# Patient Record
Sex: Female | Born: 1950 | Race: Black or African American | Hispanic: No | Smoking: Current every day smoker
Health system: Southern US, Community
[De-identification: ages and names within clinical notes are randomized; demographics above are authoritative.]

## PROBLEM LIST (undated history)

## (undated) DIAGNOSIS — B888 Other specified infestations: Secondary | ICD-10-CM

## (undated) DIAGNOSIS — I10 Essential (primary) hypertension: Secondary | ICD-10-CM

## (undated) DIAGNOSIS — G5600 Carpal tunnel syndrome, unspecified upper limb: Secondary | ICD-10-CM

## (undated) DIAGNOSIS — I739 Peripheral vascular disease, unspecified: Secondary | ICD-10-CM

## (undated) DIAGNOSIS — E785 Hyperlipidemia, unspecified: Secondary | ICD-10-CM

## (undated) DIAGNOSIS — T7840XA Allergy, unspecified, initial encounter: Secondary | ICD-10-CM

## (undated) DIAGNOSIS — K602 Anal fissure, unspecified: Secondary | ICD-10-CM

## (undated) DIAGNOSIS — T8859XA Other complications of anesthesia, initial encounter: Secondary | ICD-10-CM

## (undated) DIAGNOSIS — D649 Anemia, unspecified: Secondary | ICD-10-CM

## (undated) DIAGNOSIS — E118 Type 2 diabetes mellitus with unspecified complications: Secondary | ICD-10-CM

## (undated) DIAGNOSIS — F419 Anxiety disorder, unspecified: Secondary | ICD-10-CM

## (undated) DIAGNOSIS — K279 Peptic ulcer, site unspecified, unspecified as acute or chronic, without hemorrhage or perforation: Secondary | ICD-10-CM

## (undated) DIAGNOSIS — G8929 Other chronic pain: Secondary | ICD-10-CM

## (undated) DIAGNOSIS — H269 Unspecified cataract: Secondary | ICD-10-CM

## (undated) DIAGNOSIS — K5790 Diverticulosis of intestine, part unspecified, without perforation or abscess without bleeding: Secondary | ICD-10-CM

## (undated) DIAGNOSIS — H409 Unspecified glaucoma: Secondary | ICD-10-CM

## (undated) DIAGNOSIS — R51 Headache: Secondary | ICD-10-CM

## (undated) DIAGNOSIS — F32A Depression, unspecified: Secondary | ICD-10-CM

## (undated) DIAGNOSIS — F329 Major depressive disorder, single episode, unspecified: Secondary | ICD-10-CM

## (undated) DIAGNOSIS — T4145XA Adverse effect of unspecified anesthetic, initial encounter: Secondary | ICD-10-CM

## (undated) DIAGNOSIS — E2839 Other primary ovarian failure: Secondary | ICD-10-CM

## (undated) DIAGNOSIS — K219 Gastro-esophageal reflux disease without esophagitis: Secondary | ICD-10-CM

## (undated) DIAGNOSIS — R519 Headache, unspecified: Secondary | ICD-10-CM

## (undated) DIAGNOSIS — G459 Transient cerebral ischemic attack, unspecified: Secondary | ICD-10-CM

## (undated) DIAGNOSIS — H538 Other visual disturbances: Secondary | ICD-10-CM

## (undated) DIAGNOSIS — E039 Hypothyroidism, unspecified: Secondary | ICD-10-CM

## (undated) HISTORY — DX: Other chronic pain: G89.29

## (undated) HISTORY — DX: Hypothyroidism, unspecified: E03.9

## (undated) HISTORY — DX: Headache, unspecified: R51.9

## (undated) HISTORY — PX: REDUCTION MAMMAPLASTY: SUR839

## (undated) HISTORY — PX: BREAST REDUCTION SURGERY: SHX8

## (undated) HISTORY — DX: Unspecified cataract: H26.9

## (undated) HISTORY — DX: Anxiety disorder, unspecified: F41.9

## (undated) HISTORY — DX: Allergy, unspecified, initial encounter: T78.40XA

## (undated) HISTORY — DX: Headache: R51

## (undated) HISTORY — DX: Depression, unspecified: F32.A

## (undated) HISTORY — DX: Anal fissure, unspecified: K60.2

## (undated) HISTORY — DX: Transient cerebral ischemic attack, unspecified: G45.9

## (undated) HISTORY — PX: STOMACH SURGERY: SHX791

## (undated) HISTORY — DX: Major depressive disorder, single episode, unspecified: F32.9

## (undated) HISTORY — DX: Essential (primary) hypertension: I10

## (undated) HISTORY — PX: EYE SURGERY: SHX253

## (undated) HISTORY — DX: Peripheral vascular disease, unspecified: I73.9

## (undated) HISTORY — DX: Unspecified glaucoma: H40.9

## (undated) HISTORY — PX: COLONOSCOPY W/ POLYPECTOMY: SHX1380

## (undated) HISTORY — PX: TOTAL THYROIDECTOMY: SHX2547

## (undated) HISTORY — PX: ANAL FISSURE REPAIR: SHX2312

## (undated) HISTORY — DX: Gastro-esophageal reflux disease without esophagitis: K21.9

## (undated) HISTORY — PX: CATARACT EXTRACTION: SUR2

## (undated) HISTORY — DX: Diverticulosis of intestine, part unspecified, without perforation or abscess without bleeding: K57.90

## (undated) HISTORY — PX: AORTOGRAM: SHX6300

## (undated) HISTORY — DX: Hyperlipidemia, unspecified: E78.5

## (undated) HISTORY — DX: Peptic ulcer, site unspecified, unspecified as acute or chronic, without hemorrhage or perforation: K27.9

---
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
## 2006-04-19 ENCOUNTER — Emergency Department (HOSPITAL_COMMUNITY): Admission: EM | Admit: 2006-04-19 | Discharge: 2006-04-19 | Payer: Self-pay | Admitting: Emergency Medicine

## 2006-04-21 ENCOUNTER — Emergency Department (HOSPITAL_COMMUNITY): Admission: EM | Admit: 2006-04-21 | Discharge: 2006-04-21 | Payer: Self-pay | Admitting: Emergency Medicine

## 2006-09-18 ENCOUNTER — Ambulatory Visit: Payer: Self-pay | Admitting: Internal Medicine

## 2006-09-18 ENCOUNTER — Ambulatory Visit: Payer: Self-pay | Admitting: *Deleted

## 2006-09-18 LAB — CONVERTED CEMR LAB
Albumin: 4.4 g/dL (ref 3.5–5.2)
BUN: 10 mg/dL (ref 6–23)
CO2: 27 meq/L (ref 19–32)
Calcium: 9 mg/dL (ref 8.4–10.5)
Cholesterol: 337 mg/dL — ABNORMAL HIGH (ref 0–200)
Eosinophils Absolute: 0.2 10*3/uL (ref 0.0–0.7)
Free Thyroxine Index: 0.5 — ABNORMAL LOW (ref 1.0–3.9)
Glucose, Bld: 72 mg/dL (ref 70–99)
HDL: 48 mg/dL (ref >39)
MCHC: 31.5 g/dL (ref 30.0–36.0)
MCV: 96 fL (ref 78.0–100.0)
Monocytes Relative: 6 % (ref 3–11)
Neutro Abs: 2.4 10*3/uL (ref 1.7–7.7)
Neutrophils Relative %: 40 % — ABNORMAL LOW (ref 43–77)
Potassium: 4 meq/L (ref 3.5–5.3)
RDW: 14.5 % — ABNORMAL HIGH (ref 11.5–14.0)
Sodium: 139 meq/L (ref 135–145)
T3 Uptake Ratio: 24.1 % (ref 22.5–37.0)
T4, Total: 2.1 ug/dL — ABNORMAL LOW (ref 5.0–12.5)
Total Protein: 9 g/dL — ABNORMAL HIGH (ref 6.0–8.3)
Triglycerides: 327 mg/dL — ABNORMAL HIGH (ref <150)

## 2006-09-24 ENCOUNTER — Ambulatory Visit (HOSPITAL_COMMUNITY): Admission: RE | Admit: 2006-09-24 | Discharge: 2006-09-24 | Payer: Self-pay | Admitting: Internal Medicine

## 2006-12-18 ENCOUNTER — Ambulatory Visit: Payer: Self-pay | Admitting: Internal Medicine

## 2007-01-13 ENCOUNTER — Ambulatory Visit: Payer: Self-pay | Admitting: Internal Medicine

## 2007-02-12 ENCOUNTER — Ambulatory Visit: Payer: Self-pay | Admitting: Internal Medicine

## 2007-02-12 LAB — CONVERTED CEMR LAB
ALT: 8 units/L (ref 0–35)
Albumin: 4.8 g/dL (ref 3.5–5.2)
Alkaline Phosphatase: 78 units/L (ref 39–117)
Basophils Absolute: 0.1 10*3/uL (ref 0.0–0.1)
CO2: 27 meq/L (ref 19–32)
Calcium: 9.8 mg/dL (ref 8.4–10.5)
Creatinine, Ser: 1.5 mg/dL — ABNORMAL HIGH (ref 0.40–1.20)
Eosinophils Absolute: 0.1 10*3/uL (ref 0.0–0.7)
Eosinophils Relative: 2 % (ref 0–5)
HCT: 43.8 % (ref 36.0–46.0)
Hemoglobin: 14.3 g/dL (ref 12.0–15.0)
Lymphocytes Relative: 55 % — ABNORMAL HIGH (ref 12–46)
MCHC: 32.6 g/dL (ref 30.0–36.0)
MCV: 96.1 fL (ref 78.0–100.0)
Monocytes Absolute: 0.4 10*3/uL (ref 0.1–1.0)
Platelets: 169 10*3/uL (ref 150–400)
RDW: 14 % (ref 11.5–15.5)
T3, Total: 101.1 ng/dL (ref 80.0–204.0)
Total Bilirubin: 0.4 mg/dL (ref 0.3–1.2)
Total Protein: 9.5 g/dL — ABNORMAL HIGH (ref 6.0–8.3)

## 2007-03-29 ENCOUNTER — Ambulatory Visit: Payer: Self-pay | Admitting: Internal Medicine

## 2007-03-29 LAB — CONVERTED CEMR LAB
Basophils Absolute: 0 10*3/uL (ref 0.0–0.1)
Basophils Relative: 1 % (ref 0–1)
Cholesterol: 265 mg/dL — ABNORMAL HIGH (ref 0–200)
Eosinophils Absolute: 0.1 10*3/uL (ref 0.0–0.7)
Eosinophils Relative: 1 % (ref 0–5)
HCT: 40.5 % (ref 36.0–46.0)
MCHC: 31.6 g/dL (ref 30.0–36.0)
MCV: 97.6 fL (ref 78.0–100.0)
Platelets: 178 10*3/uL (ref 150–400)
RDW: 14.9 % (ref 11.5–15.5)
T3, Total: 111 ng/dL (ref 80.0–204.0)
T4, Total: 8.1 ug/dL (ref 5.0–12.5)
TSH: 3.67 microintl units/mL (ref 0.350–5.50)
Total CHOL/HDL Ratio: 6.2
VLDL: 72 mg/dL — ABNORMAL HIGH (ref 0–40)

## 2007-04-09 ENCOUNTER — Ambulatory Visit: Payer: Self-pay | Admitting: Internal Medicine

## 2007-06-25 ENCOUNTER — Encounter: Payer: Self-pay | Admitting: Family Medicine

## 2007-06-25 ENCOUNTER — Ambulatory Visit: Payer: Self-pay | Admitting: Internal Medicine

## 2007-06-25 LAB — CONVERTED CEMR LAB: T4, Total: 9.9 ug/dL (ref 5.0–12.5)

## 2007-08-05 ENCOUNTER — Ambulatory Visit: Payer: Self-pay | Admitting: Internal Medicine

## 2007-10-03 ENCOUNTER — Emergency Department (HOSPITAL_COMMUNITY): Admission: EM | Admit: 2007-10-03 | Discharge: 2007-10-03 | Payer: Self-pay | Admitting: Emergency Medicine

## 2008-04-17 ENCOUNTER — Emergency Department (HOSPITAL_COMMUNITY): Admission: EM | Admit: 2008-04-17 | Discharge: 2008-04-17 | Payer: Self-pay | Admitting: Emergency Medicine

## 2008-04-20 ENCOUNTER — Ambulatory Visit: Payer: Self-pay | Admitting: Internal Medicine

## 2008-04-20 ENCOUNTER — Inpatient Hospital Stay (HOSPITAL_COMMUNITY): Admission: EM | Admit: 2008-04-20 | Discharge: 2008-04-22 | Payer: Self-pay | Admitting: Emergency Medicine

## 2008-04-21 ENCOUNTER — Encounter (INDEPENDENT_AMBULATORY_CARE_PROVIDER_SITE_OTHER): Payer: Self-pay | Admitting: Cardiology

## 2008-07-12 ENCOUNTER — Emergency Department (HOSPITAL_COMMUNITY): Admission: EM | Admit: 2008-07-12 | Discharge: 2008-07-12 | Payer: Self-pay | Admitting: Family Medicine

## 2008-09-29 ENCOUNTER — Emergency Department (HOSPITAL_COMMUNITY): Admission: EM | Admit: 2008-09-29 | Discharge: 2008-09-29 | Payer: Self-pay | Admitting: Family Medicine

## 2008-11-30 ENCOUNTER — Emergency Department (HOSPITAL_COMMUNITY): Admission: EM | Admit: 2008-11-30 | Discharge: 2008-11-30 | Payer: Self-pay | Admitting: Emergency Medicine

## 2008-12-01 ENCOUNTER — Ambulatory Visit: Payer: Self-pay | Admitting: Internal Medicine

## 2009-01-10 ENCOUNTER — Ambulatory Visit: Payer: Self-pay | Admitting: Internal Medicine

## 2009-01-10 LAB — CONVERTED CEMR LAB
ALT: 9 units/L (ref 0–35)
AST: 13 units/L (ref 0–37)
Basophils Absolute: 0 10*3/uL (ref 0.0–0.1)
CO2: 24 meq/L (ref 19–32)
Calcium: 9.3 mg/dL (ref 8.4–10.5)
Chloride: 103 meq/L (ref 96–112)
Cholesterol: 365 mg/dL — ABNORMAL HIGH (ref 0–200)
Eosinophils Absolute: 0.2 10*3/uL (ref 0.0–0.7)
Eosinophils Relative: 3 % (ref 0–5)
HCT: 43.3 % (ref 36.0–46.0)
Hemoglobin: 14.1 g/dL (ref 12.0–15.0)
Lymphocytes Relative: 50 % — ABNORMAL HIGH (ref 12–46)
MCHC: 32.6 g/dL (ref 30.0–36.0)
MCV: 99.5 fL (ref 78.0–100.0)
Monocytes Absolute: 0.5 10*3/uL (ref 0.1–1.0)
Platelets: 171 10*3/uL (ref 150–400)
RDW: 14.2 % (ref 11.5–15.5)
Sodium: 140 meq/L (ref 135–145)
TSH: 5.69 microintl units/mL — ABNORMAL HIGH (ref 0.350–4.500)
Total Protein: 7.7 g/dL (ref 6.0–8.3)

## 2009-01-30 ENCOUNTER — Ambulatory Visit: Payer: Self-pay | Admitting: Internal Medicine

## 2009-02-02 ENCOUNTER — Ambulatory Visit (HOSPITAL_COMMUNITY): Admission: RE | Admit: 2009-02-02 | Discharge: 2009-02-02 | Payer: Self-pay | Admitting: Internal Medicine

## 2009-02-05 ENCOUNTER — Ambulatory Visit: Payer: Self-pay | Admitting: Internal Medicine

## 2009-02-07 ENCOUNTER — Ambulatory Visit: Payer: Self-pay | Admitting: Internal Medicine

## 2009-02-15 ENCOUNTER — Encounter (INDEPENDENT_AMBULATORY_CARE_PROVIDER_SITE_OTHER): Payer: Self-pay | Admitting: *Deleted

## 2009-02-26 ENCOUNTER — Ambulatory Visit: Payer: Self-pay | Admitting: Internal Medicine

## 2009-02-26 LAB — CONVERTED CEMR LAB
BUN: 17 mg/dL (ref 6–23)
Calcium: 9.6 mg/dL (ref 8.4–10.5)
Glucose, Bld: 86 mg/dL (ref 70–99)
Sodium: 140 meq/L (ref 135–145)
Total CK: 106 units/L (ref 7–177)

## 2009-03-19 ENCOUNTER — Ambulatory Visit: Payer: Self-pay | Admitting: Internal Medicine

## 2009-03-19 LAB — CONVERTED CEMR LAB: TSH: 0.347 microintl units/mL — ABNORMAL LOW (ref 0.350–4.500)

## 2009-03-20 ENCOUNTER — Ambulatory Visit: Payer: Self-pay | Admitting: Internal Medicine

## 2009-03-21 ENCOUNTER — Encounter (INDEPENDENT_AMBULATORY_CARE_PROVIDER_SITE_OTHER): Payer: Self-pay | Admitting: Internal Medicine

## 2009-03-21 LAB — CONVERTED CEMR LAB: Helicobacter Pylori Antibody-IgG: <0.4

## 2009-04-04 ENCOUNTER — Ambulatory Visit: Payer: Self-pay | Admitting: Internal Medicine

## 2009-04-20 ENCOUNTER — Encounter (HOSPITAL_COMMUNITY): Admission: RE | Admit: 2009-04-20 | Discharge: 2009-06-14 | Payer: Self-pay | Admitting: General Surgery

## 2009-05-07 ENCOUNTER — Ambulatory Visit: Payer: Self-pay | Admitting: Internal Medicine

## 2009-05-07 LAB — CONVERTED CEMR LAB
Alkaline Phosphatase: 96 units/L (ref 39–117)
BUN: 17 mg/dL (ref 6–23)
Basophils Absolute: 0 10*3/uL (ref 0.0–0.1)
Basophils Relative: 1 % (ref 0–1)
CO2: 25 meq/L (ref 19–32)
Creatinine, Ser: 1.2 mg/dL (ref 0.40–1.20)
Free T4: 1.63 ng/dL (ref 0.80–1.80)
Glucose, Bld: 82 mg/dL (ref 70–99)
Lymphocytes Relative: 40 % (ref 12–46)
Neutro Abs: 3.2 10*3/uL (ref 1.7–7.7)
Neutrophils Relative %: 50 % (ref 43–77)
Platelets: 165 10*3/uL (ref 150–400)
RDW: 14.2 % (ref 11.5–15.5)
TSH: 0.059 microintl units/mL — ABNORMAL LOW (ref 0.350–4.500)
Total Bilirubin: 0.4 mg/dL (ref 0.3–1.2)
Total Protein: 8.3 g/dL (ref 6.0–8.3)
WBC: 6.4 10*3/uL (ref 4.0–10.5)

## 2009-08-02 ENCOUNTER — Encounter (INDEPENDENT_AMBULATORY_CARE_PROVIDER_SITE_OTHER): Payer: Self-pay | Admitting: General Surgery

## 2009-08-02 ENCOUNTER — Ambulatory Visit (HOSPITAL_COMMUNITY): Admission: RE | Admit: 2009-08-02 | Discharge: 2009-08-05 | Payer: Self-pay | Admitting: General Surgery

## 2009-08-10 ENCOUNTER — Ambulatory Visit: Payer: Self-pay | Admitting: Internal Medicine

## 2009-08-29 ENCOUNTER — Ambulatory Visit: Payer: Self-pay | Admitting: Internal Medicine

## 2009-09-04 ENCOUNTER — Ambulatory Visit: Payer: Self-pay | Admitting: Internal Medicine

## 2009-11-16 ENCOUNTER — Encounter (INDEPENDENT_AMBULATORY_CARE_PROVIDER_SITE_OTHER): Payer: Self-pay | Admitting: Internal Medicine

## 2009-11-16 LAB — CONVERTED CEMR LAB
AST: 15 units/L (ref 0–37)
Albumin: 5 g/dL (ref 3.5–5.2)
Alkaline Phosphatase: 118 units/L — ABNORMAL HIGH (ref 39–117)
BUN: 10 mg/dL (ref 6–23)
Calcium: 10.2 mg/dL (ref 8.4–10.5)
Chloride: 103 meq/L (ref 96–112)
Potassium: 3.9 meq/L (ref 3.5–5.3)
Sodium: 141 meq/L (ref 135–145)
Total Protein: 8.1 g/dL (ref 6.0–8.3)

## 2010-01-13 HISTORY — PX: OTHER SURGICAL HISTORY: SHX169

## 2010-02-12 NOTE — Letter (Signed)
Summary: Generic Letter  [facility]  [address]   [place], [place] [address]   Phone: [phone]  Fax: [phone]    02/15/2009  LYDIA TOREN [address] [place], [place]  [address]  Dear Ms. Daponte,   We have been unable to reach you by phone. Dr. Reche Dixon wants you to continue your blood pressure medication. He also wants you to have some lab work. I have scheduled a lab visit for 02/26/09 at 10:10. If that appointment is not convenient please contact our office to reschedule.        Sincerely,   Gaylyn Cheers RN

## 2010-03-30 LAB — SURGICAL PCR SCREEN: Staphylococcus aureus: NEGATIVE

## 2010-03-30 LAB — BASIC METABOLIC PANEL
BUN: 9 mg/dL (ref 6–23)
Chloride: 99 mEq/L (ref 96–112)
Creatinine, Ser: 0.94 mg/dL (ref 0.4–1.2)
GFR calc Af Amer: >60 mL/min (ref >60)
Glucose, Bld: 116 mg/dL — ABNORMAL HIGH (ref 70–99)
Potassium: 4.1 mEq/L (ref 3.5–5.1)

## 2010-03-30 LAB — DIFFERENTIAL
Monocytes Absolute: 0.5 10*3/uL (ref 0.1–1.0)
Monocytes Relative: 8 % (ref 3–12)
Neutro Abs: 1.9 10*3/uL (ref 1.7–7.7)
Neutrophils Relative %: 32 % — ABNORMAL LOW (ref 43–77)

## 2010-03-30 LAB — COMPREHENSIVE METABOLIC PANEL
Albumin: 3.7 g/dL (ref 3.5–5.2)
BUN: 10 mg/dL (ref 6–23)
Creatinine, Ser: 1.15 mg/dL (ref 0.4–1.2)
GFR calc Af Amer: 58 mL/min — ABNORMAL LOW (ref >60)
Glucose, Bld: 105 mg/dL — ABNORMAL HIGH (ref 70–99)
Total Bilirubin: 0.4 mg/dL (ref 0.3–1.2)
Total Protein: 7.7 g/dL (ref 6.0–8.3)

## 2010-03-30 LAB — CBC
HCT: 37.5 % (ref 36.0–46.0)
HCT: 38.8 % (ref 36.0–46.0)
Hemoglobin: 12.9 g/dL (ref 12.0–15.0)
MCH: 31.6 pg (ref 26.0–34.0)
MCH: 32.2 pg (ref 26.0–34.0)
MCHC: 33.6 g/dL (ref 30.0–36.0)
MCHC: 34 g/dL (ref 30.0–36.0)
MCHC: 34.3 g/dL (ref 30.0–36.0)
MCV: 93.7 fL (ref 78.0–100.0)
Platelets: 157 10*3/uL (ref 150–400)
RDW: 13.7 % (ref 11.5–15.5)
RDW: 13.9 % (ref 11.5–15.5)
RDW: 13.9 % (ref 11.5–15.5)
WBC: 5.9 10*3/uL (ref 4.0–10.5)

## 2010-03-30 LAB — CALCIUM: Calcium: 8.7 mg/dL (ref 8.4–10.5)

## 2010-03-30 LAB — EXPECTORATED SPUTUM ASSESSMENT W GRAM STAIN, RFLX TO RESP C

## 2010-04-17 LAB — POCT I-STAT, CHEM 8
BUN: 14 mg/dL (ref 6–23)
Chloride: 105 mEq/L (ref 96–112)
Creatinine, Ser: 1.3 mg/dL — ABNORMAL HIGH (ref 0.4–1.2)
Hemoglobin: 15 g/dL (ref 12.0–15.0)
Potassium: 4.1 mEq/L (ref 3.5–5.1)
Sodium: 140 mEq/L (ref 135–145)

## 2010-04-24 LAB — LIPID PANEL
Cholesterol: 250 mg/dL — ABNORMAL HIGH (ref 0–200)
HDL: 20 mg/dL — ABNORMAL LOW (ref >39)
HDL: 25 mg/dL — ABNORMAL LOW (ref >39)
LDL Cholesterol: 165 mg/dL — ABNORMAL HIGH (ref 0–99)
LDL Cholesterol: 173 mg/dL — ABNORMAL HIGH (ref 0–99)
Total CHOL/HDL Ratio: 10 RATIO
Total CHOL/HDL Ratio: 11.8 RATIO
Triglycerides: 238 mg/dL — ABNORMAL HIGH (ref <150)
Triglycerides: 261 mg/dL — ABNORMAL HIGH (ref <150)
Triglycerides: 359 mg/dL — ABNORMAL HIGH (ref <150)
VLDL: 48 mg/dL — ABNORMAL HIGH (ref 0–40)
VLDL: 52 mg/dL — ABNORMAL HIGH (ref 0–40)
VLDL: 72 mg/dL — ABNORMAL HIGH (ref 0–40)

## 2010-04-24 LAB — APTT: aPTT: 33 seconds (ref 24–37)

## 2010-04-24 LAB — CBC
HCT: 39.6 % (ref 36.0–46.0)
Hemoglobin: 12.5 g/dL (ref 12.0–15.0)
Hemoglobin: 13.3 g/dL (ref 12.0–15.0)
MCHC: 34.5 g/dL (ref 30.0–36.0)
RBC: 3.87 MIL/uL (ref 3.87–5.11)
RDW: 14.9 % (ref 11.5–15.5)
WBC: 4.8 10*3/uL (ref 4.0–10.5)

## 2010-04-24 LAB — COMPREHENSIVE METABOLIC PANEL
ALT: 9 U/L (ref 0–35)
AST: 15 U/L (ref 0–37)
Albumin: 3.1 g/dL — ABNORMAL LOW (ref 3.5–5.2)
Alkaline Phosphatase: 65 U/L (ref 39–117)
Alkaline Phosphatase: 79 U/L (ref 39–117)
BUN: 8 mg/dL (ref 6–23)
Calcium: 8.7 mg/dL (ref 8.4–10.5)
GFR calc Af Amer: 57 mL/min — ABNORMAL LOW (ref >60)
GFR calc non Af Amer: 49 mL/min — ABNORMAL LOW (ref >60)
Glucose, Bld: 85 mg/dL (ref 70–99)
Glucose, Bld: 92 mg/dL (ref 70–99)
Potassium: 3.9 mEq/L (ref 3.5–5.1)
Potassium: 4.2 mEq/L (ref 3.5–5.1)
Sodium: 137 mEq/L (ref 135–145)
Total Bilirubin: 0.4 mg/dL (ref 0.3–1.2)
Total Protein: 6.4 g/dL (ref 6.0–8.3)
Total Protein: 7.4 g/dL (ref 6.0–8.3)

## 2010-04-24 LAB — DIFFERENTIAL
Basophils Absolute: 0 10*3/uL (ref 0.0–0.1)
Basophils Relative: 1 % (ref 0–1)
Monocytes Relative: 7 % (ref 3–12)
Neutro Abs: 2 10*3/uL (ref 1.7–7.7)
Neutrophils Relative %: 40 % — ABNORMAL LOW (ref 43–77)

## 2010-04-24 LAB — URINALYSIS, ROUTINE W REFLEX MICROSCOPIC
Bilirubin Urine: NEGATIVE
Hgb urine dipstick: NEGATIVE
Nitrite: NEGATIVE
Specific Gravity, Urine: 1.018 (ref 1.005–1.030)
pH: 6 (ref 5.0–8.0)

## 2010-04-24 LAB — POCT I-STAT, CHEM 8
Calcium, Ion: 1.15 mmol/L (ref 1.12–1.32)
Chloride: 110 mEq/L (ref 96–112)
Creatinine, Ser: 0.9 mg/dL (ref 0.4–1.2)
Glucose, Bld: 96 mg/dL (ref 70–99)
HCT: 44 % (ref 36.0–46.0)
HCT: 44 % (ref 36.0–46.0)
Hemoglobin: 15 g/dL (ref 12.0–15.0)
Potassium: 4 mEq/L (ref 3.5–5.1)
TCO2: 31 mmol/L (ref 0–100)

## 2010-04-24 LAB — TROPONIN I
Troponin I: 0.01 ng/mL (ref 0.00–0.06)
Troponin I: <0.01 ng/mL (ref 0.00–0.06)

## 2010-04-24 LAB — PROTIME-INR
INR: 1 (ref 0.00–1.49)
Prothrombin Time: 13.6 seconds (ref 11.6–15.2)

## 2010-04-24 LAB — T4, FREE: Free T4: 1.02 ng/dL (ref 0.80–1.80)

## 2010-04-24 LAB — CK TOTAL AND CKMB (NOT AT ARMC)
Relative Index: INVALID (ref 0.0–2.5)
Total CK: 74 U/L (ref 7–177)
Total CK: 78 U/L (ref 7–177)

## 2010-04-24 LAB — POCT CARDIAC MARKERS

## 2010-05-28 NOTE — Discharge Summary (Signed)
NAMEELIANIS, FISCHBACH               ACCOUNT NO.:  [id number]   MEDICAL RECORD NO.:  [id number]           PATIENT TYPE:   LOCATION:                                 FACILITY:   PHYSICIAN:  Herbie Saxon, MDDATE OF BIRTH:  06-19-1950   DATE OF ADMISSION:  DATE OF DISCHARGE:                               DISCHARGE SUMMARY   ADDENDUM:   MEDICATIONS:  Nicotine patch 10 mg per day and Toprol-XL 25 mg daily.      Herbie Saxon, MD  Electronically Signed     MIO/MEDQ  D:  04/22/2008  T:  04/23/2008  Job:  [id number]

## 2010-05-28 NOTE — Discharge Summary (Signed)
[name], [name]               ACCOUNT NO.:  [id number]   MEDICAL RECORD NO.:  [id number]          PATIENT TYPE:  INP   LOCATION:  3735                         FACILITY:  [facility]   PHYSICIAN:  Herbie Saxon, MDDATE OF BIRTH:  Jul 08, 1950   DATE OF ADMISSION:  04/20/2008  DATE OF DISCHARGE:  04/22/2008                               DISCHARGE SUMMARY   DISCHARGE DIAGNOSES:  1. Hypertension emergency, resolved.  2. Chest pain, atypical.  3. Headache, improved.  4. Ongoing tobacco abuse.  5. Hyperlipidemia.  6. Poor medication compliance due to lack of medical insurance.  7. Hypothyroidism.  8. History of multinodular goiter.  9. History of peptic ulcer disease.  10.Mild thrombocytopenia.   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  No acute changes.  CT of abdomen and pelvis on  April 21, 2008, showed descending colon diverticulosis without acute  inflammatory changes, left renal lower pole cyst.  No acute findings in  the pelvis.   HOSPITAL COURSE:  This 60 year old African American lady, who presented  to the emergency room complaining of severe frontal headache and  substernal chest pain, which was dull like, radiating to the left arm.  On presentation, she had malignant hypertension with systolic blood  pressure greater than 200 and diastolic greater than 110.  She was  admitted, started on nitroglycerin.  She had been poorly compliant with  her previous home medications, hence the patient needs recertification  through followup with [facility].  Her serial cardiac enzymes were  negative.  CRP and EKG were also negative.  Lipid panel showed  hypercholesterolemia with total cholesterol 230, LDL approximately 144,  HDL approximately of 20, and triglycerides of 359.  A 2-D echocardiogram  was performed on April 21, 2008, which showed ejection fraction of 60-65%  with moderate mitral annular calcification.  No regional  wall motion  abnormalities noted.  The patient is being counseled on tobacco  cessation.  She had a nicotine patch on this admission.  The patient has  been counseled on the need to comply with blood pressure medications to  avoid catastrophe of uncontrolled hypertension.  She was verbalized good  understanding.   DISCHARGE CONDITION:  Stable.   DIET:  Heart healthy, 2 g sodium, low cholesterol.   ACTIVITY:  Increase slowly as tolerated.   PCP to  arrange outpatient stress echocardiogram as needed.   DISCHARGE MEDICATIONS:  1. Lisinopril 40 mg daily.  2. Synthroid 88 mcg daily.  3. Norvasc 5 mg daily.  4. Protonix 40 mg daily.  5. Vitamin supplements daily.  6. Crestor 40 mg daily.  7. Plavix 75 mg daily.   PHYSICAL EXAMINATION:  GENERAL:  She is an elderly lady not in acute  distress.  VITAL SIGNS:  Temperature 98, pulse 54, respirations 18, and blood  pressure 175/97.  Please note the medications the patient to hold, blood  pressure medications if blood pressure less than 110/60 or heart rate  less than 50.  HEENT:  Head is atraumatic and normocephalic.  Mucous membranes are  moist.  Oropharynx moist and clear.  NECK:  Supple.  No lymphadenopathy.  No thyromegaly.  No JVD.  CHEST:  Clinically clear.  CVS:  Regular rate and rhythm.  No murmur, rub, or gallop.  ABDOMEN:  Benign.  CNS:  No gross deficits.  EXTREMITIES:  There is no clubbing, cyanosis, or edema.  Normal range of  motion.  SKIN:  No rashes.  PSYCH:  Mood is stable.   Thyroid function tests normal.  Chemistries; sodium 137, potassium 3.9,  chloride 106, bicarbonate 25, glucose 92, BUN 11, and creatinine 1.1.  Complete blood count, WBC 12.8, hematocrit 36, platelet count is 134.      Herbie Saxon, MD  Electronically Signed     MIO/MEDQ  D:  04/22/2008  T:  04/23/2008  Job:  [id number]

## 2010-05-28 NOTE — H&P (Signed)
[name], [name]               ACCOUNT NO.:  [id number]   MEDICAL RECORD NO.:  [id number]          PATIENT TYPE:  EMS   LOCATION:  MAJO                         FACILITY:  [facility]   PHYSICIAN:  Eduard Clos, MDDATE OF BIRTH:  1950-02-29   DATE OF ADMISSION:  04/20/2008  DATE OF DISCHARGE:                              HISTORY & PHYSICAL   PRIMARY CARE PHYSICIAN:  Dr. Reche Dixon at [facility].   CHIEF COMPLAINT:  Headache.   HISTORY OF PRESENTING ILLNESS:  A 60 year old female with known history  of hypertension, hyperlipidemia presented with complaints of headache  over the last 1 week.  She has been experiencing headache.  Headache is  frontal and occipital, constant, dull aching, sometimes excruciating.  Denies any associated dizziness or loss of consciousness or visual  symptoms.  In addition, patient started experiencing chest pain from  last night.  The chest pain is dull aching, pressure like, radiating to  the left arm.  Denies any associated shortness of breath or palpitation.  Denies any fever, chills, cough, or any nausea, vomiting, diarrhea, or  dysuria, or abdominal pain.  In the ER, patient was found to have very  high blood pressure, systolic more than 200s with a diastolic around  161W.  Patient has been admitted for further management and observation  for her chest pain and blood pressure.  Headache has largely resolved at  this time.  CT of the head done in the ER is negative for any acute  findings.  Patient stated that she has been taking her medications  regularly except for Norvasc which she was not able to get filled.   PAST MEDICAL HISTORY:  1. Hypertension.  2. Hyperlipidemia.  3. Ongoing tobacco abuse.   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   ALLERGIES:  1. ASPIRIN.  2. PENICILLIN.   MEDICATION PRIOR TO ADMISSION:  1. Lisinopril 40 mg p.o. daily.  2. Synthroid 88 mcg p.o. daily.  3. Norvasc 5 mg p.o.  daily.  4. Crestor 20 mg p.o. daily.  5. Protonix 40 mg p.o. daily.  6. Allegra 180 mg p.o. daily.  7. Catapres 0.2 mg p.o. b.i.d.   SOCIAL HISTORY:  Patient smokes cigarettes and has been advised to quit  smoking.  Denies any alcohol or drug abuse.   REVIEW OF SYSTEMS:  As per history of present illness, nothing else  significant.   PHYSICAL EXAMINATION:  Patient examined at bedside, not in acute  distress.  VITAL SIGNS:  Blood pressure is 140/60.  Pulse 70 per minute.  Temperature 97.1.  Respiratory rate 18 per minute.  O2 saturation 95%.  HEENT: Anicteric.  No pallor.  No facial asymmetry.  PERRLA positive.  Tongue is midline.  CHEST:  Bilateral air entry present.  No rhonchi.  No crepitation.  S1  and S2 heard.  ABDOMEN:  Soft, mild tenderness in the left upper and lower quadrant.  Patient said it has been there for a few days but has no diarrhea,  nausea, or vomiting.  There is no guarding or rigidity.  CNS:  Alert,  awake, oriented to time, place, and person.  Moves upper  and lower extremities.  EXTREMITIES:  Peripheral pulses felt.  No edema.   LABS:  CT of the head without contrast shows no acute findings, old  small left caudate head infarct, mild small vessel disease-type changes.  No CT evidence of any large acute infarct.  CBC, WBC is 4.9, hemoglobin  15, hematocrit 44, platelets 151, PT/INR 13.6 and 1.  Complete metabolic  panel, sodium 142, potassium 4.2, chloride 103, carbon dioxide 25,  glucose 82, BUN 10, creatinine 1.2, total bilirubin 0.4, alkaline  phosphatase 79, AST 16, ALT 13, total protein 7.4, albumin 3.6, calcium  9.1, troponin-I less than 0.05.   ASSESSMENT:  1. Chest pain, rule out acute coronary syndrome.  2. Accelerated hypertension.  3. Headache secondary to above.  4. Ongoing tobacco abuse.  5. Hyperlipidemia.  6. Nonspecific abdominal pain.   PLAN:  We will admit patient to telemetry.  Cycle cardiac markers.  At  this time, I do not find  an EKG done.  We will do an EKG stat and we  will evaluate that.  We will do a chest x-ray, 2D echo, cycle cardiac  markers.  Resume her home medications.  We will add Plavix as PATIENT IS  ALLERGIC TO ASPIRIN and further recommendation as condition evolves.      Eduard Clos, MD  Electronically Signed     ANK/MEDQ  D:  04/20/2008  T:  04/20/2008  Job:  [id number]

## 2010-10-14 LAB — POCT I-STAT, CHEM 8
BUN: 15
Creatinine, Ser: 1.3 — ABNORMAL HIGH
Glucose, Bld: 117 — ABNORMAL HIGH
Potassium: 4.1
Sodium: 136

## 2010-10-14 LAB — URINALYSIS, ROUTINE W REFLEX MICROSCOPIC
Glucose, UA: NEGATIVE
Hgb urine dipstick: NEGATIVE
Ketones, ur: NEGATIVE
Nitrite: NEGATIVE
Protein, ur: NEGATIVE
Specific Gravity, Urine: 1.034 — ABNORMAL HIGH
Urobilinogen, UA: 1
pH: 5.5

## 2012-03-29 ENCOUNTER — Ambulatory Visit: Payer: Self-pay | Admitting: Internal Medicine

## 2012-04-15 ENCOUNTER — Ambulatory Visit (INDEPENDENT_AMBULATORY_CARE_PROVIDER_SITE_OTHER): Payer: Self-pay | Admitting: Radiation Oncology

## 2012-04-15 ENCOUNTER — Encounter: Payer: Self-pay | Admitting: Radiation Oncology

## 2012-04-15 VITALS — BP 102/71 | HR 90 | Temp 98.5°F | Ht 61.4 in | Wt 214.4 lb

## 2012-04-15 DIAGNOSIS — E785 Hyperlipidemia, unspecified: Secondary | ICD-10-CM | POA: Insufficient documentation

## 2012-04-15 DIAGNOSIS — Z0000 Encounter for general adult medical examination without abnormal findings: Secondary | ICD-10-CM | POA: Insufficient documentation

## 2012-04-15 DIAGNOSIS — Z79899 Other long term (current) drug therapy: Secondary | ICD-10-CM

## 2012-04-15 DIAGNOSIS — K219 Gastro-esophageal reflux disease without esophagitis: Secondary | ICD-10-CM | POA: Insufficient documentation

## 2012-04-15 DIAGNOSIS — I739 Peripheral vascular disease, unspecified: Secondary | ICD-10-CM | POA: Insufficient documentation

## 2012-04-15 DIAGNOSIS — E039 Hypothyroidism, unspecified: Secondary | ICD-10-CM | POA: Insufficient documentation

## 2012-04-15 DIAGNOSIS — I10 Essential (primary) hypertension: Secondary | ICD-10-CM | POA: Insufficient documentation

## 2012-04-15 MED ORDER — DOXEPIN HCL 50 MG PO CAPS: 50.0000 mg | ORAL_CAPSULE | Freq: Every day | ORAL | Status: DC

## 2012-04-15 MED ORDER — CLOPIDOGREL BISULFATE 75 MG PO TABS: 75.0000 mg | ORAL_TABLET | Freq: Every day | ORAL | Status: DC

## 2012-04-15 MED ORDER — TRIAMTERENE-HCTZ 37.5-25 MG PO TABS: 1.0000 | ORAL_TABLET | Freq: Every day | ORAL | Status: DC

## 2012-04-15 MED ORDER — AMLODIPINE BESYLATE 10 MG PO TABS: 10.0000 mg | ORAL_TABLET | Freq: Every day | ORAL | Status: DC

## 2012-04-15 MED ORDER — PANTOPRAZOLE SODIUM 40 MG PO TBEC: 40.0000 mg | DELAYED_RELEASE_TABLET | Freq: Every day | ORAL | Status: DC

## 2012-04-15 MED ORDER — LISINOPRIL 20 MG PO TABS: 20.0000 mg | ORAL_TABLET | Freq: Every day | ORAL | Status: DC

## 2012-04-15 MED ORDER — LEVOTHYROXINE SODIUM 88 MCG PO TABS: 88.0000 ug | ORAL_TABLET | Freq: Every day | ORAL | Status: DC

## 2012-04-15 MED ORDER — ROSUVASTATIN CALCIUM 20 MG PO TABS: 20.0000 mg | ORAL_TABLET | Freq: Every day | ORAL | Status: DC

## 2012-04-15 NOTE — Assessment & Plan Note (Addendum)
At baseline, per patient. Pt states this issue has been present for many years since she had an operation for PUD (pt unclear as to exact procedure).  - continue protonix 40mg  daily

## 2012-04-15 NOTE — Assessment & Plan Note (Signed)
Patient had a lower extremity arteriogram in 10/2010 which revealed bilateral superficial femoral artery stenosis, L>R. Per her report, she subsequently underwent left SFA stenting which improved her claudication symptoms significantly, as her symptoms have been worse in that leg. As the patient believes her claudication is slightly worsened recently, her symptoms will need to be followed closely going forward, however as she has been noncompliant with lipid lowering therapy as well as with antiplatelet therapy, will resume these medications today prior to starting additional meds. - plavix 75mg  QD - crestor 20mg  QD

## 2012-04-15 NOTE — Assessment & Plan Note (Signed)
Last LDL = 86 in 01/2011 per the records available from the patient's previous PCP in [place]. It is unclear why the patient is not currently prescribed a statin as was previously prescribed for this issue. Given that her Crestor seemed to be discontinued at some point in time after her last LDL was drawn, will need to repeat lipid profile to assess the appropriate intensity of lipid-lowering therapy going forward. As she has active worsened claudication, however, will resume her previous crestor 20mg  daily until results are available.  - crestor 20mg  QD - check lipid profile

## 2012-04-15 NOTE — Progress Notes (Signed)
Subjective:    Patient ID: [name], female    DOB: [birth year], 62 y.o.   MRN: [id number]  HPI Patient is a 62 year old woman with PMH significant for peripheral artery disease, hypertension, hyperlipidemia, peptic ulcer disease, TIA, hypothyroidism with prior thyroidectomy (Hashimoto's thyroiditis) who presents to clinic today to establish care with a PCP after recently moving from [place].   The patient complains of claudication-like symptoms on walking beyond a certain distance (several blocks), which she states is chronic but perhaps slightly worsened recently. She states she has not taken some of her medications recently due to running out of her prescriptions, including her prescription for Plavix.   The patient's only other complaint today is of pain in the left posterior neck, which he states is improved with Flexeril which she has been previously prescribed.  The patient states she's otherwise feeling well and has no other complaints today. She requests that her medications be refilled today, but states that she would not like to have any laboratory work or other routine health maintenance items performed on today's visit as she is "not ready for that today".   Review of Systems  All other systems reviewed and are negative.   Current Outpatient Medications: Current Outpatient Prescriptions  Medication Sig Dispense Refill  . amLODipine (NORVASC) 10 MG tablet Take 1 tablet (10 mg total) by mouth daily.  30 tablet  11  . clopidogrel (PLAVIX) 75 MG tablet Take 1 tablet (75 mg total) by mouth daily.  30 tablet  11  . doxepin (SINEQUAN) 50 MG capsule Take 1 capsule (50 mg total) by mouth at bedtime.  30 capsule  5  . levothyroxine (SYNTHROID) 88 MCG tablet Take 1 tablet (88 mcg total) by mouth daily.  30 tablet  11  . lisinopril (PRINIVIL,ZESTRIL) 20 MG tablet Take 1 tablet (20 mg total) by mouth daily.  30 tablet  11  . rosuvastatin (CRESTOR) 20 MG tablet Take 1 tablet (20 mg  total) by mouth at bedtime.  30 tablet  11  . triamterene-hydrochlorothiazide (MAXZIDE-25) 37.5-25 MG per tablet Take 1 each (1 tablet total) by mouth daily.  30 tablet  11   No current facility-administered medications for this visit.    Allergies: Allergies not on file   Past Medical History: Past Medical History  Diagnosis Date  . Hypothyroidism   . Hypertension   . TIA (transient ischemic attack)   . PUD (peptic ulcer disease)   . HLD (hyperlipidemia)   . Depression   . GERD (gastroesophageal reflux disease)     Past Surgical History: Past Surgical History  Procedure Laterality Date  . Total thyroidectomy      Family History: No family history on file.  Social History: History   Social History  . Marital Status: Married    Spouse Name: N/A    Number of Children: N/A  . Years of Education: N/A   Occupational History  . Not on file.   Social History Main Topics  . Smoking status: Current Every Day Smoker -- 0.75 packs/day  . Smokeless tobacco: Not on file     Comment: try to quit  . Alcohol Use: Not on file  . Drug Use: Not on file  . Sexually Active: Not on file   Other Topics Concern  . Not on file   Social History Narrative  . No narrative on file     Vital Signs: Blood pressure 102/71, pulse 90, temperature 98.5 F (36.9 C), temperature source Oral, height 5'  1.4" (1.56 m), weight 214 lb 6.4 oz (97.251 kg), SpO2 98.00%.       Objective:   Physical Exam  Constitutional: She is oriented to person, place, and time. She appears well-developed and well-nourished. No distress.  HENT:  Head: Normocephalic and atraumatic.  Eyes: Conjunctivae are normal. Pupils are equal, round, and reactive to light. No scleral icterus.  Neck: Normal range of motion. Neck supple. No tracheal deviation present.  Cardiovascular: Normal rate and regular rhythm.   No murmur heard. Pulmonary/Chest: Effort normal. She has no wheezes. She has no rales.  Abdominal:  Soft. Bowel sounds are normal. She exhibits no distension. There is no tenderness.  Musculoskeletal: Normal range of motion. She exhibits no edema.  Neurological: She is alert and oriented to person, place, and time. No cranial nerve deficit.          Assessment & Plan:

## 2012-04-15 NOTE — Assessment & Plan Note (Signed)
TSH and free T4 within normal limits as of 01/2011 per records from [facility] PCP. No signs/symptoms of hyperthyroidism or hypothyroidism.  - continue Synthroid 88 mcg daily - recheck TSH

## 2012-04-15 NOTE — Addendum Note (Signed)
Addended by: Elenor Legato R on: 04/15/2012 01:34 PM   Modules accepted: Orders

## 2012-04-15 NOTE — Assessment & Plan Note (Addendum)
BP Readings from Last 3 Encounters:  04/15/12 102/71    Lab Results  Component Value Date   NA 141 11/16/2009   K 3.9 11/16/2009   CREATININE 1.15 11/16/2009    Assessment:  Blood pressure control: controlled  Progress toward BP goal:  at goal  Comments: patient has been fully compliant with anti-HTN meds, and BP very well controlled on current regimen. Will not de-escalate therapy today given this is the patient's first visit, no symptoms of hypotension, and a history of hypertensive emergency. &  Plan:  Medications:  continue current medications  Educational resources provided:    Self management tools provided:    Other plans:

## 2012-04-15 NOTE — Assessment & Plan Note (Signed)
Patient had a negative mammogram (BI-RADS 1) in 2013. She states she had a negative colonoscopy in 2011. She states that she never gets the flu shot, as she thinks it will make her sick, and maintains this opinion despite being educated on the issue today. She refuses any health care maintenance items today, but is willing to return later in the week to have these performed, as well as to have the necessary blood work drawn. - zostavax and tdap when pt returns for [employer]

## 2012-04-19 ENCOUNTER — Telehealth: Payer: Self-pay | Admitting: *Deleted

## 2012-04-19 NOTE — Telephone Encounter (Signed)
Pt called back to let you know she had been on flexeril 10 mg once a day or as needed.  Prescribed by Dr Jyl Heinz.   She had forgotten the name of med when in office.  Will you refill? [employer]  On [employer].

## 2012-04-20 NOTE — Progress Notes (Signed)
Dr McTyre was ordering MD. Message forwarded to him. 

## 2012-04-20 NOTE — Telephone Encounter (Signed)
Will defer this decision to Dr. Lavena Bullion.

## 2012-04-20 NOTE — Progress Notes (Signed)
Dr Lavena Bullion was ordering MD. Message forwarded to him.

## 2012-04-21 ENCOUNTER — Encounter: Payer: Self-pay | Admitting: Internal Medicine

## 2012-04-21 ENCOUNTER — Other Ambulatory Visit (INDEPENDENT_AMBULATORY_CARE_PROVIDER_SITE_OTHER): Payer: Self-pay

## 2012-04-21 ENCOUNTER — Telehealth: Payer: Self-pay | Admitting: *Deleted

## 2012-04-21 ENCOUNTER — Ambulatory Visit: Payer: Self-pay | Admitting: *Deleted

## 2012-04-21 DIAGNOSIS — E039 Hypothyroidism, unspecified: Secondary | ICD-10-CM

## 2012-04-21 DIAGNOSIS — I739 Peripheral vascular disease, unspecified: Secondary | ICD-10-CM

## 2012-04-21 DIAGNOSIS — R269 Unspecified abnormalities of gait and mobility: Secondary | ICD-10-CM | POA: Insufficient documentation

## 2012-04-21 DIAGNOSIS — I10 Essential (primary) hypertension: Secondary | ICD-10-CM

## 2012-04-21 DIAGNOSIS — E785 Hyperlipidemia, unspecified: Secondary | ICD-10-CM

## 2012-04-21 LAB — LIPID PANEL
Cholesterol: 293 mg/dL — ABNORMAL HIGH (ref 0–200)
HDL: 38 mg/dL — ABNORMAL LOW (ref >39)
Total CHOL/HDL Ratio: 7.7 Ratio
Triglycerides: 360 mg/dL — ABNORMAL HIGH (ref <150)

## 2012-04-21 LAB — COMPREHENSIVE METABOLIC PANEL
Albumin: 4.3 g/dL (ref 3.5–5.2)
BUN: 16 mg/dL (ref 6–23)
Calcium: 9.5 mg/dL (ref 8.4–10.5)
Chloride: 106 mEq/L (ref 96–112)
Creat: 1.5 mg/dL — ABNORMAL HIGH (ref 0.50–1.10)
Glucose, Bld: 96 mg/dL (ref 70–99)
Potassium: 4.5 mEq/L (ref 3.5–5.3)

## 2012-04-21 NOTE — Telephone Encounter (Signed)
Pt was in clinic for lab work - talked with pt about doing Tdap.  Pt does not want Tdap at this time. Stanton Kidney Jeslin Bazinet RN 04/21/12 3PM

## 2012-04-22 ENCOUNTER — Ambulatory Visit: Payer: Self-pay

## 2012-04-22 ENCOUNTER — Other Ambulatory Visit: Payer: Self-pay

## 2012-04-26 ENCOUNTER — Other Ambulatory Visit: Payer: Self-pay | Admitting: Radiation Oncology

## 2012-04-26 MED ORDER — CYCLOBENZAPRINE HCL 10 MG PO TABS: 10.0000 mg | ORAL_TABLET | Freq: Every day | ORAL | Status: DC | PRN

## 2012-04-26 NOTE — Progress Notes (Signed)
INTERNAL MEDICINE TEACHING ATTENDING ADDENDUM - Inez Catalina, MD: I reviewed with the resident Dr. Lavena Bullion, Ms. Goldberger's  medical history, physical examination, diagnosis and results of tests and treatment and I agree with the patient's care as documented.

## 2012-05-05 ENCOUNTER — Encounter: Payer: Self-pay | Admitting: Radiation Oncology

## 2012-05-25 ENCOUNTER — Other Ambulatory Visit: Payer: Self-pay

## 2012-06-08 ENCOUNTER — Encounter: Payer: Self-pay | Admitting: Radiation Oncology

## 2012-06-15 ENCOUNTER — Encounter: Payer: Self-pay | Admitting: Radiation Oncology

## 2012-09-06 ENCOUNTER — Other Ambulatory Visit: Payer: Self-pay | Admitting: *Deleted

## 2012-09-10 MED ORDER — CYCLOBENZAPRINE HCL 10 MG PO TABS: 10.0000 mg | ORAL_TABLET | Freq: Every day | ORAL | Status: DC | PRN

## 2012-09-10 NOTE — Telephone Encounter (Signed)
Please sch appt with PCP next 90 days

## 2012-09-20 ENCOUNTER — Ambulatory Visit (INDEPENDENT_AMBULATORY_CARE_PROVIDER_SITE_OTHER): Payer: Medicare Other | Admitting: Internal Medicine

## 2012-09-20 ENCOUNTER — Encounter: Payer: Self-pay | Admitting: Internal Medicine

## 2012-09-20 VITALS — BP 155/96 | HR 86 | Temp 97.5°F | Ht 60.0 in | Wt 217.0 lb

## 2012-09-20 DIAGNOSIS — I739 Peripheral vascular disease, unspecified: Secondary | ICD-10-CM

## 2012-09-20 DIAGNOSIS — F419 Anxiety disorder, unspecified: Secondary | ICD-10-CM | POA: Insufficient documentation

## 2012-09-20 DIAGNOSIS — R7309 Other abnormal glucose: Secondary | ICD-10-CM

## 2012-09-20 DIAGNOSIS — F411 Generalized anxiety disorder: Secondary | ICD-10-CM

## 2012-09-20 DIAGNOSIS — E785 Hyperlipidemia, unspecified: Secondary | ICD-10-CM

## 2012-09-20 DIAGNOSIS — R739 Hyperglycemia, unspecified: Secondary | ICD-10-CM

## 2012-09-20 DIAGNOSIS — I10 Essential (primary) hypertension: Secondary | ICD-10-CM | POA: Diagnosis not present

## 2012-09-20 LAB — GLUCOSE, CAPILLARY: Glucose-Capillary: 102 mg/dL — ABNORMAL HIGH (ref 70–99)

## 2012-09-20 LAB — POCT GLYCOSYLATED HEMOGLOBIN (HGB A1C): Hemoglobin A1C: 5.8

## 2012-09-20 MED ORDER — DOXEPIN HCL 100 MG PO CAPS: 100.0000 mg | ORAL_CAPSULE | Freq: Every day | ORAL | Status: DC

## 2012-09-20 MED ORDER — DOXEPIN HCL 50 MG PO CAPS: 100.0000 mg | ORAL_CAPSULE | Freq: Every day | ORAL | Status: DC

## 2012-09-20 MED ORDER — TRIAMTERENE-HCTZ 37.5-25 MG PO TABS: 1.0000 | ORAL_TABLET | Freq: Every day | ORAL | Status: DC

## 2012-09-20 NOTE — Assessment & Plan Note (Signed)
Assessment:  BP 155/96.  Patient has not taken triamterene-HCTZ in one month because she says there were no refills. Pain likely contributing as well.  Plan:   1) Refill triamterene-HCTZ  2) Continue current regimen.

## 2012-09-20 NOTE — Progress Notes (Signed)
  Subjective:    Patient ID: [name], female    DOB: 12-26-1950, 62 y.o.   MRN: [id number]  HPI Comments: [name] is a 62 yo woman with a PMH of PAD (s/p left SFA stenting in 2012), HTN, GERD and hypothyroidism.  She presents with increasing B/L lower extremity claudication (L>R) for the past 2 months.  She had significant relief after her stenting in 2012 but over the past 2 months her symptoms have returned.  She describes burning and throbbing pain from the base of her foot up to her thigh.  She also has noticed B/L lower extremity edema.  She has not taken her triamterene-HCTZ in 1 month. She denies CP/SOB.     Review of Systems  Constitutional: Positive for appetite change.  Respiratory: Negative for shortness of breath.   Cardiovascular: Negative for chest pain.  Gastrointestinal: Positive for constipation. Negative for diarrhea.  Neurological: Positive for numbness. Negative for weakness.       Objective:   Physical Exam  Constitutional: She is oriented to person, place, and time. She appears well-developed and well-nourished. No distress.  HENT:  Mouth/Throat: Oropharynx is clear and moist. No oropharyngeal exudate.  Eyes: EOM are normal. Pupils are equal, round, and reactive to light.  Neck: Neck supple.  Cardiovascular: Normal rate, regular rhythm and normal heart sounds.   Pulmonary/Chest: Effort normal and breath sounds normal. No respiratory distress. She has no wheezes. She has no rales.  Abdominal: Soft. Bowel sounds are normal. She exhibits no distension. There is no tenderness.  Musculoskeletal:  +2 B/L lower extremity edema.  Neurological: She is alert and oriented to person, place, and time.  5/5 MMS B/L; patellar and achilles reflexes 2+ B/L; 2+ DP on R, 1+ DP on L  Skin: Skin is warm. She is not diaphoretic.  Psychiatric: She has a normal mood and affect. Her behavior is normal.          Assessment & Plan:  Please see problem based charting.

## 2012-09-20 NOTE — Assessment & Plan Note (Signed)
Assessment:  Patient reports compliance with Crestor 20mg  daily. Lipid profile elevated in April but patient declines re-check today.  Plan:   1) continue Crestor 20mg  daily  2) Check lipids in 1 month

## 2012-09-20 NOTE — Patient Instructions (Addendum)
General Instructions: 1. We are sending you for testing (ABI) and to see vascular surgery about your PAD.  Return to clinic for follow-up and lipid check in 4 weeks.  Stopping smoking and low intensity exercise can help your PAD.    Begin walking short distances each day (for example, walk to the mailbox and back to your home).  Increase the distance by a few extra feet each day.  Try to increase your activity slowly.  2. Please take all medications as prescribed.   3. If you have worsening of your symptoms or new symptoms arise, please call the clinic ([phone]), or go to the ER immediately if symptoms are severe.  You have done great job in taking all your medications. I appreciate it very much. Please continue doing that.    Treatment Goals:  Goals (1 Years of Data) as of 09/20/12     Lifestyle    . Prevent Falls       Progress Toward Treatment Goals:  Treatment Goal 04/15/2012  Blood pressure at goal  Stop smoking unable to assess    Self Care Goals & Plans:  Self Care Goal 09/20/2012  Manage my medications take my medicines as prescribed  Eat healthy foods drink diet soda or water instead of juice or soda; eat more vegetables; eat foods that are low in salt; eat baked foods instead of fried foods  Be physically active -  Stop smoking -  Other (No Data)  Meeting treatment goals maintain the current self-care plan       Care Management & Community Referrals:  Referral 09/20/2012  Referrals made for care management support social worker    Peripheral Vascular Disease Peripheral Vascular Disease (PVD), also called Peripheral Arterial Disease (PAD), is a circulation problem caused by cholesterol (atherosclerotic plaque) deposits in the arteries. PVD commonly occurs in the lower extremities (legs) but it can occur in other areas of the body, such as your arms. The cholesterol buildup in the arteries reduces blood flow which can cause pain and other serious problems. The  presence of PVD can place a person at risk for Coronary Artery Disease (CAD).  CAUSES  Causes of PVD can be many. It is usually associated with more than one risk factor such as:   High Cholesterol.  Smoking.  Diabetes.  Lack of exercise or inactivity.  High blood pressure (hypertension).  Obesity.  Family history. SYMPTOMS   When the lower extremities are affected, patients with PVD may experience:  Leg pain with exertion or physical activity. This is called INTERMITTENT CLAUDICATION. This may present as cramping or numbness with physical activity. The location of the pain is associated with the level of blockage. For example, blockage at the abdominal level (distal abdominal aorta) may result in buttock or hip pain. Lower leg arterial blockage may result in calf pain.  As PVD becomes more severe, pain can develop with less physical activity.  In people with severe PVD, leg pain may occur at rest.  Other PVD signs and symptoms:  Leg numbness or weakness.  Coldness in the affected leg or foot, especially when compared to the other leg.  A change in leg color.  Patients with significant PVD are more prone to ulcers or sores on toes, feet or legs. These may take longer to heal or may reoccur. The ulcers or sores can become infected.  If signs and symptoms of PVD are ignored, gangrene may occur. This can result in the loss of toes or loss  of an entire limb.  Not all leg pain is related to PVD. Other medical conditions can cause leg pain such as:  Blood clots (embolism) or Deep Vein Thrombosis.  Inflammation of the blood vessels (vasculitis).  Spinal stenosis. DIAGNOSIS  Diagnosis of PVD can involve several different types of tests. These can include:  Pulse Volume Recording Method (PVR). This test is simple, painless and does not involve the use of X-rays. PVR involves measuring and comparing the blood pressure in the arms and legs. An ABI (Ankle-Brachial Index) is  calculated. The normal ratio of blood pressures is 1. As this number becomes smaller, it indicates more severe disease.  < 0.95  indicates significant narrowing in one or more leg vessels.  <0.8 there will usually be pain in the foot, leg or buttock with exercise.  <0.4 will usually have pain in the legs at rest.  <0.25  usually indicates limb threatening PVD.  Doppler detection of pulses in the legs. This test is painless and checks to see if you have a pulses in your legs/feet.  A dye or contrast material (a substance that highlights the blood vessels so they show up on x-ray) may be given to help your caregiver better see the arteries for the following tests. The dye is eliminated from your body by the kidney's. Your caregiver may order blood work to check your kidney function and other laboratory values before the following tests are performed:  Magnetic Resonance Angiography (MRA). An MRA is a picture study of the blood vessels and arteries. The MRA machine uses a large magnet to produce images of the blood vessels.  Computed Tomography Angiography (CTA). A CTA is a specialized x-ray that looks at how the blood flows in your blood vessels. An IV may be inserted into your arm so contrast dye can be injected.  Angiogram. Is a procedure that uses x-rays to look at your blood vessels. This procedure is minimally invasive, meaning a small incision (cut) is made in your groin. A small tube (catheter) is then inserted into the artery of your groin. The catheter is guided to the blood vessel or artery your caregiver wants to examine. Contrast dye is injected into the catheter. X-rays are then taken of the blood vessel or artery. After the images are obtained, the catheter is taken out. TREATMENT  Treatment of PVD involves many interventions which may include:  Lifestyle changes:  Quitting smoking.  Exercise.  Following a low fat, low cholesterol diet.  Control of diabetes.  Foot care is  very important to the PVD patient. Good foot care can help prevent infection.  Medication:  Cholesterol-lowering medicine.  Blood pressure medicine.  Anti-platelet drugs.  Certain medicines may reduce symptoms of Intermittent Claudication.  Interventional/Surgical options:  Angioplasty. An Angioplasty is a procedure that inflates a balloon in the blocked artery. This opens the blocked artery to improve blood flow.  Stent Implant. A wire mesh tube (stent) is placed in the artery. The stent expands and stays in place, allowing the artery to remain open.  Peripheral Bypass Surgery. This is a surgical procedure that reroutes the blood around a blocked artery to help improve blood flow. This type of procedure may be performed if Angioplasty or stent implants are not an option. SEEK IMMEDIATE MEDICAL CARE IF:   You develop pain or numbness in your arms or legs.  Your arm or leg turns cold, becomes blue in color.  You develop redness, warmth, swelling and pain in your arms or  legs. MAKE SURE YOU:   Understand these instructions.  Will watch your condition.  Will get help right away if you are not doing well or get worse. Document Released: 02/07/2004 Document Revised: 03/24/2011 Document Reviewed: 01/04/2008 [facility] Patient Information 2014 [place], [place].

## 2012-09-20 NOTE — Assessment & Plan Note (Signed)
Assessment:  Patient reports history of anxiety.  She has been taking doxepin 50mg  qHS but still experiences some anxiety during the day.  She had also been taking valium until her prescription ran out 2 months ago.  Patient is agreeable to seeking mental health services for therapy and says this has helped in the past.  Plan:   1) Increased Doxepin to 100mg  qHS  2) Behavioral health referral

## 2012-09-20 NOTE — Assessment & Plan Note (Addendum)
Assessment:  Patient with lower extremity claudication (L>R).  She reports compliance with plavix, crestor.    Plan:   1) Check ABI  2) Refer to vascular surgery for evaluation  3) Continue current medications  4) Advised patient to quit smoking   5) Advised patient to begin walking and increase the distance slowly

## 2012-09-22 NOTE — Progress Notes (Signed)
I saw and evaluated the patient.  I personally confirmed the key portions of the history and exam documented by Dr. Wilson and I reviewed pertinent patient test results.  The assessment, diagnosis, and plan were formulated together and I agree with the documentation in the resident's note. 

## 2012-09-23 ENCOUNTER — Telehealth: Payer: Self-pay | Admitting: Licensed Clinical Social Worker

## 2012-09-23 ENCOUNTER — Encounter: Payer: Self-pay | Admitting: Licensed Clinical Social Worker

## 2012-09-23 NOTE — Telephone Encounter (Signed)
Ms. Linzy was referred to CSW for referral to behavioral health services.  CSW placed call to Ms. Cutillo this morning.  Pt states she is in agreement for behavioral health services.  Ms. Kretz states she has not been established with a psychiatrist since she moved to [place] from [place]; however pt did mention going to "[facility] [place] some time ago.  CSW discussed referrals available through pt's Medicare part B insurance, pt aware of copayment and this is feasible.  Ms. Galvao states Tues and Fridays are the best days for appointments.  Pt in agreement to referral to [facility].   Referral completed, appt scheduled for 10/17 at 800.  CSW will mail letter and pt informed Behavioral health will send information and paperwork out.  Ms. Glandon is aware CSW is available to assist as needed.

## 2012-09-30 ENCOUNTER — Telehealth: Payer: Self-pay | Admitting: *Deleted

## 2012-09-30 NOTE — Telephone Encounter (Signed)
Pt has an appointment scheduled with Vascular lab at [facility] on 10/06/2012 at 11:30 AM.  Pt will need to register in [facility] by 11:15 AM.  Call to pt message left with female who answered phone to have patient  call the Clinics concerning an appointment.  Angelina Ok, RN 09/30/2012 12:27 PM.

## 2012-10-04 ENCOUNTER — Ambulatory Visit (HOSPITAL_COMMUNITY)
Admission: RE | Admit: 2012-10-04 | Discharge: 2012-10-04 | Disposition: A | Discharge status: Home / Self Care | Payer: Medicare Other | Source: Ambulatory Visit | Attending: Internal Medicine | Admitting: Internal Medicine

## 2012-10-04 DIAGNOSIS — M79609 Pain in unspecified limb: Secondary | ICD-10-CM | POA: Diagnosis not present

## 2012-10-04 DIAGNOSIS — I739 Peripheral vascular disease, unspecified: Secondary | ICD-10-CM | POA: Diagnosis not present

## 2012-10-04 DIAGNOSIS — E119 Type 2 diabetes mellitus without complications: Secondary | ICD-10-CM | POA: Diagnosis not present

## 2012-10-04 DIAGNOSIS — F17200 Nicotine dependence, unspecified, uncomplicated: Secondary | ICD-10-CM | POA: Insufficient documentation

## 2012-10-04 DIAGNOSIS — I70219 Atherosclerosis of native arteries of extremities with intermittent claudication, unspecified extremity: Secondary | ICD-10-CM | POA: Diagnosis not present

## 2012-10-29 ENCOUNTER — Encounter (HOSPITAL_COMMUNITY): Payer: Self-pay | Admitting: Psychiatry

## 2012-10-29 ENCOUNTER — Ambulatory Visit (INDEPENDENT_AMBULATORY_CARE_PROVIDER_SITE_OTHER): Payer: Medicare Other | Admitting: Psychiatry

## 2012-10-29 ENCOUNTER — Encounter (INDEPENDENT_AMBULATORY_CARE_PROVIDER_SITE_OTHER): Payer: Self-pay

## 2012-10-29 VITALS — BP 139/91 | HR 85 | Resp 12 | Ht 62.0 in | Wt 214.0 lb

## 2012-10-29 DIAGNOSIS — F332 Major depressive disorder, recurrent severe without psychotic features: Secondary | ICD-10-CM

## 2012-10-29 MED ORDER — SERTRALINE HCL 100 MG PO TABS: 100.0000 mg | ORAL_TABLET | Freq: Every day | ORAL | Status: DC

## 2012-10-29 NOTE — Progress Notes (Signed)
Psychiatric Assessment Adult  Patient Identification:  [name] Date of Evaluation:  10/29/2012 Chief Complaint: I need help now History of Chief Complaint:  No chief complaint on file.  this patient is a 69 her old African American separated mother who comes today to be evaluated and treated for daily depression. The patient recently moved into [facility] a year or so ago. She's experienced multiple deaths of her family. The patient's sister died in 05-26-2011 which was the last one of her siblings remaining. This was in July of 2013. In July of 2014 just months ago her cousin died of pancreatic cancer. One month ago her only nephew at age 64 died in a motor vehicle accident. 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 At this time the patient acknowledges persistent daily depression over more than a year starting about the time her sister died. She says in the last few months it is worse in. The only way the patient sleeps is by taking 100 mg of doxepin. The patient says her appetite is significantly reduced. She maintained her weight by eating junk food. Patient's energy level is reduced and her ability to think and concentrate this distinctly changed in the last 5 months. The patient denies psychomotor changes but describes significant worthlessness. The patient denies being suicidal at this time but it is noted that she's made 2 suicide attempts in her life. In 25-May-1971 she had persistent daily depression with vegetative symptoms and made a suicide attempt by overdosing on medications. Essentially a similar episode occurred in 05-26-2002. Noted is the patient has never been in a psychiatric hospital before. This patient denies the use of alcohol but admits to the use of marijuana on a daily basis. She says marijuana relaxants are and actually stimulate her appetite. She  denies that she doesn't to get you for or to alter her mental status but I suspect it does. The patient denies any psychotic symptomatology. The patient does describe a stated at least 2 episodes of major depression. The patient denies any symptoms of mania. She denies any symptoms of generalized anxiety disorder or panic disorder. What she does describe his symptoms similar to agoraphobia. She says that when she goes out to the public she feels exceedingly anxious and has anxiety attacks. This leads her to be more withdrawn and isolated which is gotten worse as her depression is worse. The patient describes some symptoms of OCD but does not have a full disorder. The patient smokes one pack of cigarettes a day. She describes some fleeting neurological symptoms is being looked at by her primary care Dr. The patient denies chest pain or shortness of breath. She does describe pain in her legs which is related to peripheral vascular disease. The patient has never been in a psychiatric hospital but did receive treatment outpatient setting at [facility] in [place]. She also has been in therapy before. She's taking Wellbutrin in the past but does not remember the dose to ration her affect. She her members taking Celexa which she thinks might have helped. Patient thought she was coming to get some Valium here but in reality her depression is much more significant than the anxiety. She is referred to [place] by her primary care physician at the [facility] outpatient medical clinic.  HPI Review of Systems Physical Exam  Depressive Symptoms: depressed mood,  (Hypo) Manic Symptoms:  Elevated Mood:  No Irritable Mood:  No Grandiosity:  No Distractibility:  No Labiality of Mood:  No Delusions:  No Hallucinations:  No Impulsivity:  No Sexually Inappropriate Behavior:  No Financial Extravagance:  No Flight of Ideas:  No  Anxiety Symptoms: Excessive Worry:  No Panic Symptoms:  No Agoraphobia:  Yes Obsessive  Compulsive: No  Symptoms: None, Specific Phobias:  No Social Anxiety:  No  Psychotic Symptoms:  Hallucinations: No None Delusions:  No Paranoia:  No   Ideas of Reference:  No  PTSD Symptoms: Ever had a traumatic exposure:  No Had a traumatic exposure in the last month:  No Re-experiencing:   Hypervigilance:   Hyperarousal:   Avoidance:    Traumatic Brain Injury:    Past Psychiatric History: Diagnosis: Major depression   Hospitalizations:   Outpatient Care: [facility]   Substance Abuse Care:   Self-Mutilation:   Suicidal Attempts: 2 times both by overdose   Violent Behaviors:    Past Medical History:   Past Medical History  Diagnosis Date  . Hypothyroidism   . Hypertension   . TIA (transient ischemic attack)   . PUD (peptic ulcer disease)   . HLD (hyperlipidemia)   . Depression   . GERD (gastroesophageal reflux disease)    History of Loss of Consciousness:   Seizure History:   Cardiac History:   Allergies:  Not on File Current Medications:  Current Outpatient Prescriptions  Medication Sig Dispense Refill  . amLODipine (NORVASC) 10 MG tablet Take 1 tablet (10 mg total) by mouth daily.  30 tablet  11  . clopidogrel (PLAVIX) 75 MG tablet Take 1 tablet (75 mg total) by mouth daily.  30 tablet  11  . cyclobenzaprine (FLEXERIL) 10 MG tablet Take 1 tablet (10 mg total) by mouth daily as needed for muscle spasms.  30 tablet  1  . doxepin (SINEQUAN) 100 MG capsule Take 1 capsule (100 mg total) by mouth at bedtime.  30 capsule  2  . levothyroxine (SYNTHROID) 88 MCG tablet Take 1 tablet (88 mcg total) by mouth daily.  30 tablet  11  . lisinopril (PRINIVIL,ZESTRIL) 20 MG tablet Take 1 tablet (20 mg total) by mouth daily.  30 tablet  11  . pantoprazole (PROTONIX) 40 MG tablet Take 1 tablet (40 mg total) by mouth daily.  30 tablet  5  . rosuvastatin (CRESTOR) 20 MG tablet Take 1 tablet (20 mg total) by mouth at bedtime.  30 tablet  11  . sertraline (ZOLOFT) 100 MG tablet Take 1  tablet (100 mg total) by mouth daily.  30 tablet  5  . triamterene-hydrochlorothiazide (MAXZIDE-25) 37.5-25 MG per tablet Take 1 tablet by mouth daily.  30 tablet  2   No current facility-administered medications for this visit.    Previous Psychotropic Medications:  Medication Dose   Doxepin 100 mg                        Substance   Medical Consequences of Substance Abuse:   Legal Consequences of Substance Abuse:   Family Consequences of Substance Abuse:   Blackouts:   DT's:   Withdrawal Symptoms:     Social History: Current Place of Residence: [place] of Birth:  Family Members:  Marital Status:  Separated Children: 1  Sons:    Daughters:   Relationships:   Education:  Corporate treasurer Problems/Performance:  Religious Beliefs/Practices:  History of Abuse: none Teacher, music History:   Legal History:  Hobbies/Interests:   Family History:  No family history on file.  Mental Status Examination/Evaluation: Objective:  Appearance: Casual  Eye Contact::  Good  Speech:  Clear and Coherent  Volume:  Normal  Mood:  Sad  Affect:  Restricted  Thought Process:  Coherent  Orientation:  Full (Time, Place, and Person)  Thought Content:  WDL  Suicidal Thoughts:  No  Homicidal Thoughts:  No  Judgement:  Good  Insight:  Good  Psychomotor Activity:  Decreased  Akathisia:  No  Handed:  Right  AIMS (if indicated):    Assets:  Desire for Improvement    Laboratory/X-Ray Psychological Evaluation(s)        Assessment:  Axis I: Major Depression, Recurrent severe  AXIS I Major Depression, Recurrent severe  AXIS II Deferred  AXIS III Past Medical History  Diagnosis Date  . Hypothyroidism   . Hypertension   . TIA (transient ischemic attack)   . PUD (peptic ulcer disease)   . HLD (hyperlipidemia)   . Depression   . GERD (gastroesophageal reflux disease)      AXIS IV other psychosocial or environmental problems  AXIS V  51-60 moderate symptoms   Treatment Plan/Recommendations:  Plan of Care: At this time this patient she'll begin on Zoloft. She'll receive a prescription for 100 mg and instructions to take half in the morning for 6 days and then to take 1 each morning. The patient will continue taking doxepin 100 mg with her Zoloft. Today the patient will be referred to Ms. Victorino Dike brown for psychotherapy. She'll specifically be around loss and grieving.   Laboratory:    Psychotherapy:   Medications:   Routine PRN Medications:    Consultations:   Safety Concerns:    Other:      Lucas Mallow, MD 10/17/20149:28 AM

## 2012-11-02 ENCOUNTER — Other Ambulatory Visit: Payer: Self-pay | Admitting: *Deleted

## 2012-11-02 DIAGNOSIS — I739 Peripheral vascular disease, unspecified: Secondary | ICD-10-CM

## 2012-11-03 ENCOUNTER — Encounter (HOSPITAL_COMMUNITY): Payer: Self-pay | Admitting: Psychiatry

## 2012-11-03 ENCOUNTER — Ambulatory Visit (INDEPENDENT_AMBULATORY_CARE_PROVIDER_SITE_OTHER): Payer: Medicare Other | Admitting: Psychiatry

## 2012-11-03 DIAGNOSIS — F331 Major depressive disorder, recurrent, moderate: Secondary | ICD-10-CM | POA: Diagnosis not present

## 2012-11-03 DIAGNOSIS — F332 Major depressive disorder, recurrent severe without psychotic features: Secondary | ICD-10-CM
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
## 2012-11-17 ENCOUNTER — Ambulatory Visit (INDEPENDENT_AMBULATORY_CARE_PROVIDER_SITE_OTHER): Payer: Medicare Other | Admitting: Psychiatry

## 2012-11-17 DIAGNOSIS — F332 Major depressive disorder, recurrent severe without psychotic features: Secondary | ICD-10-CM | POA: Diagnosis not present

## 2012-11-18 ENCOUNTER — Encounter (HOSPITAL_COMMUNITY): Payer: Self-pay | Admitting: Psychiatry

## 2012-11-18 NOTE — Progress Notes (Signed)
   THERAPIST PROGRESS NOTE  Session Time: 12:30-1:20  Participation Level: Active  Behavioral Response: CasualAlertDysphoric  Type of Therapy: Individual Therapy  Treatment Goals addressed: emotion regulation, grief, stress management  Interventions: CBT  Summary: [name] is a 62 y.o. female who presents with depression and anxiety.   Suicidal/Homicidal: Nowithout intent/plan  Therapist Response: Pt. Reports that this time of the year is difficult due to her brother's death on her birthday and several other deaths of significant family members. Pt. Reports that she continues to be challenged by agoraphobia, but came to today's appointment with plans of taking the bus home. Introduced [employer] and 4-3-8 breathing to assist with anxiety while on the bus.  Plan: Return again in 2 weeks.  Diagnosis: Axis I: Depressive Disorder NOS    Axis II: No diagnosis    Wynonia Musty 11/18/2012

## 2012-11-29 ENCOUNTER — Encounter: Payer: Self-pay | Admitting: Vascular Surgery

## 2012-11-30 ENCOUNTER — Other Ambulatory Visit (HOSPITAL_COMMUNITY): Payer: Self-pay

## 2012-11-30 ENCOUNTER — Encounter: Payer: Self-pay | Admitting: Vascular Surgery

## 2012-12-03 ENCOUNTER — Ambulatory Visit (HOSPITAL_COMMUNITY): Payer: Self-pay | Admitting: Psychiatry

## 2012-12-13 ENCOUNTER — Encounter: Payer: Self-pay | Admitting: Internal Medicine

## 2012-12-13 ENCOUNTER — Ambulatory Visit (INDEPENDENT_AMBULATORY_CARE_PROVIDER_SITE_OTHER): Payer: Medicare Other | Admitting: Internal Medicine

## 2012-12-13 VITALS — BP 155/97 | HR 82 | Temp 98.6°F | Ht 61.0 in | Wt 218.8 lb

## 2012-12-13 DIAGNOSIS — R51 Headache: Secondary | ICD-10-CM | POA: Diagnosis not present

## 2012-12-13 DIAGNOSIS — I10 Essential (primary) hypertension: Secondary | ICD-10-CM | POA: Diagnosis not present

## 2012-12-13 DIAGNOSIS — F332 Major depressive disorder, recurrent severe without psychotic features: Secondary | ICD-10-CM | POA: Diagnosis not present

## 2012-12-13 DIAGNOSIS — I739 Peripheral vascular disease, unspecified: Secondary | ICD-10-CM | POA: Diagnosis not present

## 2012-12-13 DIAGNOSIS — H269 Unspecified cataract: Secondary | ICD-10-CM | POA: Diagnosis not present

## 2012-12-13 DIAGNOSIS — R7309 Other abnormal glucose: Secondary | ICD-10-CM | POA: Diagnosis not present

## 2012-12-13 DIAGNOSIS — F411 Generalized anxiety disorder: Secondary | ICD-10-CM

## 2012-12-13 DIAGNOSIS — E039 Hypothyroidism, unspecified: Secondary | ICD-10-CM | POA: Diagnosis not present

## 2012-12-13 DIAGNOSIS — H5789 Other specified disorders of eye and adnexa: Secondary | ICD-10-CM

## 2012-12-13 DIAGNOSIS — E785 Hyperlipidemia, unspecified: Secondary | ICD-10-CM | POA: Diagnosis not present

## 2012-12-13 DIAGNOSIS — Z0000 Encounter for general adult medical examination without abnormal findings: Secondary | ICD-10-CM | POA: Diagnosis not present

## 2012-12-13 HISTORY — PX: OTHER SURGICAL HISTORY: SHX169

## 2012-12-13 MED ORDER — CYCLOBENZAPRINE HCL 10 MG PO TABS: 10.0000 mg | ORAL_TABLET | Freq: Every day | ORAL | Status: DC | PRN

## 2012-12-13 MED ORDER — LISINOPRIL 40 MG PO TABS: 40.0000 mg | ORAL_TABLET | Freq: Every day | ORAL | Status: DC

## 2012-12-13 MED ORDER — GUAIFENESIN ER 600 MG PO TB12: 600.0000 mg | ORAL_TABLET | Freq: Two times a day (BID) | ORAL | Status: DC

## 2012-12-13 MED ORDER — DOXEPIN HCL 100 MG PO CAPS: 100.0000 mg | ORAL_CAPSULE | Freq: Every day | ORAL | Status: DC

## 2012-12-13 NOTE — Assessment & Plan Note (Addendum)
She will need a recheck of her fasting lipid panel for her next clinic visit. Ordering one to be done prior to her appointment on 12/17 with her PCP, Dr. Andrey Campanile. She endorses taking her Crestor currently, which she is to continue.,

## 2012-12-13 NOTE — Assessment & Plan Note (Signed)
Last TSH from 04/21/12 was elevated to 13.484. At that time she is taking Synthroid 88 mcg daily, but I don't think that her dose was modified after the TSH results it. She will need a recheck her TSH prior to her next visit on 12/17. This order has been put into the system, and the patient is to return to the clinic before 12/17 to have this checked.

## 2012-12-13 NOTE — Assessment & Plan Note (Addendum)
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 She states she is followed by an ophthalmologist before moving to [facility], but has not seen anyone here in town. Referring patient to ophthalmology for further evaluation.

## 2012-12-13 NOTE — Assessment & Plan Note (Signed)
BP Readings from Last 3 Encounters:  12/13/12 155/97  10/29/12 139/91  09/20/12 155/96    Lab Results  Component Value Date   NA 144 04/21/2012   K 4.5 04/21/2012   CREATININE 1.50* 04/21/2012    Assessment: Blood pressure control: moderately elevated Progress toward BP goal:  unchanged Comments: Patient endorses compliance with her amlodipine, lisinopril, and Maxzide.  Plan: Medications:  continue current medications, but will increase the lisinopril to 40 mg a day Educational resources provided:   Self management tools provided:   Other plans: She is to followup in about 2 weeks, and we will recheck her blood pressure at time. We'll also check a CMP to evaluate her renal function and potassium levels. I am ordering these labs today for the patient to come in before her appointment on 12/17 to have these labs drawn.

## 2012-12-13 NOTE — Progress Notes (Signed)
Case discussed with Dr. Glenn soon after the resident saw the patient.  We reviewed the resident's history and exam and pertinent patient test results.  I agree with the assessment, diagnosis, and plan of care documented in the resident's note. 

## 2012-12-13 NOTE — Assessment & Plan Note (Signed)
Patient is to smoking. ABIs performed on 10/04/12. Bilateral ABIs indicate moderate decrease in flow. Left worse than right. Per patient, she has been seen by vascular surgery, and has a followup appointment on 12/16.

## 2012-12-13 NOTE — Progress Notes (Signed)
Patient ID: [name], female   DOB: Feb 28, 1950, 62 y.o.   MRN: [id number]  Subjective:   Patient ID: [name] female   DOB: 14-Jun-1950 62 y.o.   MRN: [id number]  HPI: [name] is a 62 y.o. F w/ PMH hypothyroidism, HLD, HTN, and major depression presents to the clinic today for a BP check.   Patient endorses compliance with her blood pressure medicines which include amlodipine 10 mg daily, lisinopril 20 mg daily, and Maxzide 37.5-2.5 mg daily. She states that she would like to come off some of her blood pressure medications, as she feels she takes too many medicines.  She also endorses headaches that are occurring behind her eyes over the past 2 months. Said the pain is worse behind her left eye. 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 This does not obstruct her vision. She previously had a cataract in her left eye, but states that it was removed. She does endorse having a cataract in her right eye which she states needs to be removed. She des not currently have an ophthalmologist here in [place].  She does endorse his nasal drainage, and sinus pressure. She states that her drainage is usually white in color, and denies any yellow or green drainage.  She endorses pain and stiffness in her back and that is relieved with Flexeril. However she states that she ran Flexeril about 2 weeks ago. She states that her headaches are worse when she doesn't take her Flexeril.  Of note, TSH was checked 04/21/12, and was significantly elevated to 13.484. She's been taking Synthroid 88 mcg daily, and I do not think her dose was adjusted after her TSH returned.   Past Medical History  Diagnosis Date  . Hypothyroidism   . Hypertension   . TIA (transient ischemic attack)   . PUD (peptic ulcer disease)   . HLD (hyperlipidemia)   . Depression   . GERD (gastroesophageal reflux disease)    Current Outpatient Prescriptions  Medication Sig  Dispense Refill  . amLODipine (NORVASC) 10 MG tablet Take 1 tablet (10 mg total) by mouth daily.  30 tablet  11  . clopidogrel (PLAVIX) 75 MG tablet Take 1 tablet (75 mg total) by mouth daily.  30 tablet  11  . cyclobenzaprine (FLEXERIL) 10 MG tablet Take 1 tablet (10 mg total) by mouth daily as needed for muscle spasms.  30 tablet  1  . doxepin (SINEQUAN) 100 MG capsule Take 1 capsule (100 mg total) by mouth at bedtime.  30 capsule  2  . levothyroxine (SYNTHROID) 88 MCG tablet Take 1 tablet (88 mcg total) by mouth daily.  30 tablet  11  . lisinopril (PRINIVIL,ZESTRIL) 20 MG tablet Take 1 tablet (20 mg total) by mouth daily.  30 tablet  11  . pantoprazole (PROTONIX) 40 MG tablet Take 1 tablet (40 mg total) by mouth daily.  30 tablet  5  . rosuvastatin (CRESTOR) 20 MG tablet Take 1 tablet (20 mg total) by mouth at bedtime.  30 tablet  11  . sertraline (ZOLOFT) 100 MG tablet Take 1 tablet (100 mg total) by mouth daily.  30 tablet  5  . triamterene-hydrochlorothiazide (MAXZIDE-25) 37.5-25 MG per tablet Take 1 tablet by mouth daily.  30 tablet  2   No current facility-administered medications for this visit.   No family history on file. History   Social History  . Marital Status: Married    Spouse  Name: N/A    Number of Children: N/A  . Years of Education: N/A   Social History Main Topics  . Smoking status: Current Every Day Smoker -- 0.50 packs/day  . Smokeless tobacco: None     Comment: try to quit  . Alcohol Use: No  . Drug Use: No  . Sexual Activity: None   Other Topics Concern  . None   Social History Narrative  . None   Review of Systems: Constitutional: Denies fever, chills, diaphoresis. +fatigue.  HEENT: +photophobia, pain behind the eyes bilaterally, sinus pressure, PND, neck muscle tightness. +left eye mass. Denies eye pain, redness, hearing loss, ear pain Respiratory: Denies SOB, DOE Cardiovascular: Denies chest pain Gastrointestinal: Denies nausea, vomiting,  abdominal pain, diarrhea, constipation Genitourinary: Denies dysuria, urgency, frequency Musculoskeletal: Denies myalgias or arthralgias  Neurological: +dizziness and headaches. Denies seizures, syncope, weakness, light-headedness, numbness.  Psychiatric/Behavioral: +depression. Denies suicidal ideation  Objective:  Physical Exam: Filed Vitals:   12/13/12 1456  BP: 151/85  Pulse: 90  Temp: 98.6 F (37 C)  TempSrc: Oral  Height: 5\' 1"  (1.549 m)  Weight: 218 lb 12.8 oz (99.247 kg)  SpO2: 97%   Constitutional: Vital signs reviewed.  Patient is a well-developed and well-nourished female in no acute distress and cooperative with exam. Head: Normocephalic and atraumatic Ear: Unable to visual TMs bilaterally 2/2 cerumen  Eyes: PERRL, EOMI, large mass under the sclera on the lateral portion of the left eye, visible only with adduction of the left eye. Right eye with cataract, unable to visualize fundus of the left eye. Neck: Supple, Trachea midline normal ROM Cardiovascular: RRR, no MRG, pulses symmetric and intact bilaterally Pulmonary/Chest: normal respiratory effort, CTAB, no wheezes, rales, or rhonchi Abdominal: Soft. Non-tender, non-distended, hypoactive bowel sounds Musculoskeletal: No joint deformities, erythema Neurological: A&O x3, cranial nerve II-XII are grossly intact, non-focal Psychiatric: Normal mood and affect. Speech and behavior is normal.   Assessment & Plan:   Please refer to Problem List based Assessment and Plan

## 2012-12-13 NOTE — Patient Instructions (Signed)
I am increasing her blood pressure medicine, the lisinopril to 40 mg a day do to your persistently elevated blood pressure.  Begin taking Mucinex one to 2 tablets 2 times a day for your sinus pressure congestion. He also continued taking your Flexeril. Let's see how this helps your headaches. If her headaches persist, or worsen over the next week or 2 please call the clinic to let us know. Otherwise I like free to come back to the clinic to see me or Dr. Andrey Campanile over the next few weeks.  I put a referral for you to be seen by ophthalmologist, and for you to have your screening colonoscopy which is recommended for all people after the age of 33. If you do not hear from the clinic next week regarding his appointment, please call the clinic to inquire about them.

## 2012-12-13 NOTE — Assessment & Plan Note (Addendum)
Given the patient's postnasal drainage and sinus congestion, with tenderness to palpation over the nasal bridge and in her temples, I suspect that some of her headache has to be related to her sinuses. I also suspect that she also has an element of a tension headache, given that her headaches are worse with increased stressors, and with increased neck stiffness this seems to improve with the use of Flexeril. I am prescribing Mucinex the patient to take for her congestion, and am refilling her Flexeril, as she has been out for the past 2 weeks. She's to followup in the clinic in the next 2 weeks, and we will see how her headaches are doing at that time. She's to call the clinic if her symptoms do not improve or worsen over the next few weeks. - Mucinex 600 mg 1-2 tablets twice a day when necessary for congestion and postnasal drainage - Flexeril 10 mg daily when necessary for neck muscle tightness - Followup in 2 weeks

## 2012-12-13 NOTE — Assessment & Plan Note (Signed)
Pt being seen by [facility] and is currently on Zoloft. She states that the Holidays are a difficult time of the year for her, but she feels like her depression is fairly well controlled at this time.

## 2012-12-13 NOTE — Assessment & Plan Note (Addendum)
Patient refusing flu shot, Tdap, and shingles vaccine. She states that she knows she is due for a Pap smear, but does not want one today. She is willing to be referred for a screening colonoscopy. I'm placing a referral to GI today for a screening colonoscopy.

## 2012-12-20 ENCOUNTER — Other Ambulatory Visit (INDEPENDENT_AMBULATORY_CARE_PROVIDER_SITE_OTHER): Payer: Medicare Other

## 2012-12-20 DIAGNOSIS — I10 Essential (primary) hypertension: Secondary | ICD-10-CM | POA: Diagnosis not present

## 2012-12-20 DIAGNOSIS — E785 Hyperlipidemia, unspecified: Secondary | ICD-10-CM | POA: Diagnosis not present

## 2012-12-20 DIAGNOSIS — E039 Hypothyroidism, unspecified: Secondary | ICD-10-CM | POA: Diagnosis not present

## 2012-12-20 LAB — COMPLETE METABOLIC PANEL WITH GFR
Alkaline Phosphatase: 96 U/L (ref 39–117)
BUN: 13 mg/dL (ref 6–23)
CO2: 27 mEq/L (ref 19–32)
Calcium: 9.1 mg/dL (ref 8.4–10.5)
GFR, Est African American: 45 mL/min — ABNORMAL LOW
GFR, Est Non African American: 39 mL/min — ABNORMAL LOW
Glucose, Bld: 92 mg/dL (ref 70–99)
Sodium: 140 mEq/L (ref 135–145)
Total Bilirubin: 0.3 mg/dL (ref 0.3–1.2)
Total Protein: 6.8 g/dL (ref 6.0–8.3)

## 2012-12-20 LAB — LIPID PANEL
Cholesterol: 341 mg/dL — ABNORMAL HIGH (ref 0–200)
HDL: 34 mg/dL — ABNORMAL LOW (ref >39)
Total CHOL/HDL Ratio: 10 Ratio

## 2012-12-21 ENCOUNTER — Other Ambulatory Visit: Payer: Self-pay | Admitting: Internal Medicine

## 2012-12-21 DIAGNOSIS — E785 Hyperlipidemia, unspecified: Secondary | ICD-10-CM

## 2012-12-21 LAB — TSH: TSH: 12.443 u[IU]/mL — ABNORMAL HIGH (ref 0.350–4.500)

## 2012-12-21 LAB — LDL CHOLESTEROL, DIRECT: Direct LDL: 140 mg/dL — ABNORMAL HIGH

## 2012-12-21 NOTE — Addendum Note (Signed)
Addended by: Remus Blake on: 12/21/2012 10:50 AM   Modules accepted: Orders

## 2012-12-22 ENCOUNTER — Ambulatory Visit (HOSPITAL_COMMUNITY): Payer: Self-pay | Admitting: Psychiatry

## 2012-12-25 ENCOUNTER — Emergency Department (HOSPITAL_COMMUNITY)
Admission: EM | Admit: 2012-12-25 | Discharge: 2012-12-25 | Disposition: A | Discharge status: Home / Self Care | Payer: Medicare Other | Attending: Emergency Medicine | Admitting: Emergency Medicine

## 2012-12-25 ENCOUNTER — Encounter (HOSPITAL_COMMUNITY): Payer: Self-pay | Admitting: Emergency Medicine

## 2012-12-25 ENCOUNTER — Emergency Department (HOSPITAL_COMMUNITY): Payer: Medicare Other

## 2012-12-25 DIAGNOSIS — F3289 Other specified depressive episodes: Secondary | ICD-10-CM | POA: Insufficient documentation

## 2012-12-25 DIAGNOSIS — H269 Unspecified cataract: Secondary | ICD-10-CM | POA: Diagnosis not present

## 2012-12-25 DIAGNOSIS — E039 Hypothyroidism, unspecified: Secondary | ICD-10-CM | POA: Diagnosis not present

## 2012-12-25 DIAGNOSIS — M79609 Pain in unspecified limb: Secondary | ICD-10-CM | POA: Diagnosis not present

## 2012-12-25 DIAGNOSIS — IMO0001 Reserved for inherently not codable concepts without codable children: Secondary | ICD-10-CM | POA: Diagnosis not present

## 2012-12-25 DIAGNOSIS — E785 Hyperlipidemia, unspecified: Secondary | ICD-10-CM | POA: Insufficient documentation

## 2012-12-25 DIAGNOSIS — K219 Gastro-esophageal reflux disease without esophagitis: Secondary | ICD-10-CM | POA: Diagnosis not present

## 2012-12-25 DIAGNOSIS — E0590 Thyrotoxicosis, unspecified without thyrotoxic crisis or storm: Secondary | ICD-10-CM | POA: Diagnosis not present

## 2012-12-25 DIAGNOSIS — H0019 Chalazion unspecified eye, unspecified eyelid: Secondary | ICD-10-CM | POA: Diagnosis not present

## 2012-12-25 DIAGNOSIS — Z79899 Other long term (current) drug therapy: Secondary | ICD-10-CM | POA: Diagnosis not present

## 2012-12-25 DIAGNOSIS — R0602 Shortness of breath: Secondary | ICD-10-CM | POA: Insufficient documentation

## 2012-12-25 DIAGNOSIS — I739 Peripheral vascular disease, unspecified: Secondary | ICD-10-CM

## 2012-12-25 DIAGNOSIS — F17200 Nicotine dependence, unspecified, uncomplicated: Secondary | ICD-10-CM | POA: Diagnosis not present

## 2012-12-25 DIAGNOSIS — I10 Essential (primary) hypertension: Secondary | ICD-10-CM | POA: Insufficient documentation

## 2012-12-25 DIAGNOSIS — R519 Headache, unspecified: Secondary | ICD-10-CM

## 2012-12-25 DIAGNOSIS — R0982 Postnasal drip: Secondary | ICD-10-CM | POA: Insufficient documentation

## 2012-12-25 DIAGNOSIS — R51 Headache: Secondary | ICD-10-CM | POA: Insufficient documentation

## 2012-12-25 DIAGNOSIS — Z8673 Personal history of transient ischemic attack (TIA), and cerebral infarction without residual deficits: Secondary | ICD-10-CM | POA: Diagnosis not present

## 2012-12-25 DIAGNOSIS — J9819 Other pulmonary collapse: Secondary | ICD-10-CM | POA: Diagnosis not present

## 2012-12-25 DIAGNOSIS — Z880 Allergy status to penicillin: Secondary | ICD-10-CM | POA: Diagnosis not present

## 2012-12-25 DIAGNOSIS — Z8711 Personal history of peptic ulcer disease: Secondary | ICD-10-CM | POA: Diagnosis not present

## 2012-12-25 DIAGNOSIS — F329 Major depressive disorder, single episode, unspecified: Secondary | ICD-10-CM | POA: Diagnosis not present

## 2012-12-25 DIAGNOSIS — Z7902 Long term (current) use of antithrombotics/antiplatelets: Secondary | ICD-10-CM | POA: Diagnosis not present

## 2012-12-25 DIAGNOSIS — H538 Other visual disturbances: Secondary | ICD-10-CM | POA: Insufficient documentation

## 2012-12-25 LAB — BASIC METABOLIC PANEL
CO2: 25 mEq/L (ref 19–32)
Chloride: 102 mEq/L (ref 96–112)
Creatinine, Ser: 1.27 mg/dL — ABNORMAL HIGH (ref 0.50–1.10)
GFR calc Af Amer: 51 mL/min — ABNORMAL LOW (ref >90)
Glucose, Bld: 101 mg/dL — ABNORMAL HIGH (ref 70–99)
Potassium: 4.5 mEq/L (ref 3.5–5.1)

## 2012-12-25 LAB — CBC WITH DIFFERENTIAL/PLATELET
Eosinophils Absolute: 0.1 10*3/uL (ref 0.0–0.7)
HCT: 40 % (ref 36.0–46.0)
Hemoglobin: 13 g/dL (ref 12.0–15.0)
Lymphocytes Relative: 46 % (ref 12–46)
Lymphs Abs: 2.4 10*3/uL (ref 0.7–4.0)
MCH: 30.4 pg (ref 26.0–34.0)
Monocytes Absolute: 0.3 10*3/uL (ref 0.1–1.0)
Monocytes Relative: 6 % (ref 3–12)
Neutro Abs: 2.4 10*3/uL (ref 1.7–7.7)
Neutrophils Relative %: 45 % (ref 43–77)
Platelets: 151 10*3/uL (ref 150–400)
RBC: 4.28 MIL/uL (ref 3.87–5.11)
WBC: 5.2 10*3/uL (ref 4.0–10.5)

## 2012-12-25 LAB — TROPONIN I: Troponin I: <0.3 ng/mL (ref <0.30)

## 2012-12-25 MED ORDER — OXYCODONE-ACETAMINOPHEN 5-325 MG PO TABS: 1.0000 | ORAL_TABLET | ORAL | Status: DC | PRN

## 2012-12-25 MED ORDER — MORPHINE SULFATE 4 MG/ML IJ SOLN
4.0000 mg | Freq: Once | INTRAMUSCULAR | Status: AC
  Administered 2012-12-25: 4 mg via INTRAVENOUS
  Filled 2012-12-25: qty 1

## 2012-12-25 MED ORDER — FENTANYL CITRATE 0.05 MG/ML IJ SOLN
50.0000 ug | Freq: Once | INTRAMUSCULAR | Status: AC
  Administered 2012-12-25: 50 ug via INTRAVENOUS
  Filled 2012-12-25: qty 2

## 2012-12-25 MED ORDER — SODIUM CHLORIDE 0.9 % IV BOLUS (SEPSIS)
500.0000 mL | Freq: Once | INTRAVENOUS | Status: AC
  Administered 2012-12-25: 500 mL via INTRAVENOUS

## 2012-12-25 NOTE — ED Notes (Addendum)
Pt c/o frontal headache described as "burning" sensation, pt also states she has sharp shooting pain from buttocks to both of her legs. Pt reports she was seen here on Monday for the same and was told to come back here if pain got worse. Pt rates pain in head 10/10, Leg pain 10/10. Pt describes pain in legs as a "burning" "electric wire" feeling.

## 2012-12-25 NOTE — ED Notes (Addendum)
Pt c/o frontal HA with pain in back of head and pain in lower back and down bilateral legs; pt sts was seen here on Monday for same

## 2012-12-25 NOTE — Progress Notes (Signed)
VASCULAR LAB PRELIMINARY  ARTERIAL  ABI completed:ABIs indicate normal arterial flow bilaterally, however, waveforms are abnormal.     RIGHT    LEFT    PRESSURE WAVEFORM  PRESSURE WAVEFORM  BRACHIAL   BRACHIAL 138   DP 96 DM DP 148 DM  AT   AT    PT 132 M PT 146 DM  PER   PER    GREAT TOE  NA GREAT TOE  NA    RIGHT LEFT  ABI 0.96 1.07     Wilbert Schouten, RVT 12/25/2012, 4:16 PM

## 2012-12-25 NOTE — ED Notes (Signed)
Discharge instructions reviewed with pt. Pt verbalized understanding.   

## 2012-12-25 NOTE — ED Provider Notes (Signed)
Medical screening examination/treatment/procedure(s) were performed by non-physician practitioner and as supervising physician I was immediately available for consultation/collaboration.  EKG Interpretation   None        Shon Baton, MD 12/25/12 [phone]

## 2012-12-25 NOTE — ED Notes (Addendum)
The patient c/o pain (from lower back to the ankle) ; while tech was assisting to the restroom. The tech has reported to the RN in charge.

## 2012-12-25 NOTE — ED Provider Notes (Signed)
CSN: [id number]     Arrival date & time 12/25/12  1033 History   First MD Initiated Contact with Patient 12/25/12 1109     Chief Complaint  Patient presents with  . Headache  . Leg Pain   (Consider location/radiation/quality/duration/timing/severity/associated sxs/prior Treatment) Patient is a 62 y.o. female presenting with headaches and leg pain. The history is provided by the patient and medical records.  Headache Associated symptoms: myalgias   Leg Pain  This is a 62 year old female with past medical history significant for hypertension, hyperthyroidism, prior TIA, hyperlipidemia presenting to the ED with multiple complaints.  Has been seen by her PCP last week and discussed all of these issues.  1-- headache.  Intermittent over the past 2 months.  Localized to forehead, described as a sharp and burning sensation., lasting variable lengths of time with each occurrence.  No associated photophobia, phonophobia, aura, visual disturbance, tinnitus, confusion, dizziness, numbness/weakness of extremities, or changes in speech.  Pt notes she has blurred vision in her right eye at baseline from cataract that needs to be removed and chalazion of left upper eyelid-- has been referred to opthalmology for both of these issues.  Denies any neck pain, fevers, sweats, or chills.    2-- bilateral leg pain.  Has been present for the past several years.  Pain starts at the top of her buttocks bilaterally and radiates down entire length of her legs.  Pain described as a burning sensation and a tightness, worse with walking or climbing stairs.  LLE pain > RLE.  Pt states recently pain has been worse on her left posterior ankle but denies injury, trauma, or falls.  Has known hx of claudication and has been referred to vascular surgery-- scheduled to see them on 12/28/12.  No recent travel, LE edema, or calf pain.  No loss of bowel or bladder control.  3-- SOB. States intermittent, mostly occuring with exertional  activity.  No associated chest pain, palpitations, dizziness, or diaphoresis.  Has been having some PND-- states she always has this.  No recent sick contacts.  Past Medical History  Diagnosis Date  . Hypothyroidism   . Hypertension   . TIA (transient ischemic attack)   . PUD (peptic ulcer disease)   . HLD (hyperlipidemia)   . Depression   . GERD (gastroesophageal reflux disease)    Past Surgical History  Procedure Laterality Date  . Total thyroidectomy     History reviewed. No pertinent family history. History  Substance Use Topics  . Smoking status: Current Every Day Smoker -- 0.50 packs/day  . Smokeless tobacco: Not on file     Comment: try to quit  . Alcohol Use: No   OB History   Grav Para Term Preterm Abortions TAB SAB Ect Mult Living                 Review of Systems  Musculoskeletal: Positive for arthralgias and myalgias.  Neurological: Positive for headaches.  All other systems reviewed and are negative.    Allergies  Asa and Penicillins  Home Medications   Current Outpatient Rx  Name  Route  Sig  Dispense  Refill  . amLODipine (NORVASC) 10 MG tablet   Oral   Take 1 tablet (10 mg total) by mouth daily.   30 tablet   11   . clopidogrel (PLAVIX) 75 MG tablet   Oral   Take 1 tablet (75 mg total) by mouth daily.   30 tablet   11   .  cyclobenzaprine (FLEXERIL) 10 MG tablet   Oral   Take 1 tablet (10 mg total) by mouth daily as needed for muscle spasms.   30 tablet   2   . doxepin (SINEQUAN) 100 MG capsule   Oral   Take 1 capsule (100 mg total) by mouth at bedtime.   30 capsule   2   . guaiFENesin (MUCINEX) 600 MG 12 hr tablet   Oral   Take 1 tablet (600 mg total) by mouth 2 (two) times daily.   60 tablet   2   . levothyroxine (SYNTHROID) 88 MCG tablet   Oral   Take 1 tablet (88 mcg total) by mouth daily.   30 tablet   11   . lisinopril (PRINIVIL,ZESTRIL) 40 MG tablet   Oral   Take 1 tablet (40 mg total) by mouth daily.   30  tablet   11   . pantoprazole (PROTONIX) 40 MG tablet   Oral   Take 1 tablet (40 mg total) by mouth daily.   30 tablet   5   . rosuvastatin (CRESTOR) 20 MG tablet   Oral   Take 1 tablet (20 mg total) by mouth at bedtime.   30 tablet   11   . sertraline (ZOLOFT) 100 MG tablet   Oral   Take 1 tablet (100 mg total) by mouth daily.   30 tablet   5   . triamterene-hydrochlorothiazide (MAXZIDE-25) 37.5-25 MG per tablet   Oral   Take 1 tablet by mouth daily.   30 tablet   2    BP 143/93  Pulse 87  Temp(Src) 99.1 F (37.3 C) (Oral)  Resp 18  Ht 5\' 1"  (1.549 m)  Wt 218 lb (98.884 kg)  BMI 41.21 kg/m2  SpO2 98%  Physical Exam  Nursing note and vitals reviewed. Constitutional: She is oriented to person, place, and time. She appears well-developed and well-nourished. No distress.  HENT:  Head: Normocephalic and atraumatic.  Mouth/Throat: Oropharynx is clear and moist.  Eyes: Conjunctivae and EOM are normal. Pupils are equal, round, and reactive to light.  Large chalazion of left upper inner eyelid; non-tender to palpation  Neck: Normal range of motion. Neck supple.  Cardiovascular: Normal rate, regular rhythm and normal heart sounds.   Pulmonary/Chest: Effort normal and breath sounds normal. No respiratory distress. She has no wheezes.  Abdominal: Soft. Bowel sounds are normal. There is no tenderness. There is no guarding.  Musculoskeletal: Normal range of motion.  No calf pain, asymmetry, or palpable cord; negative Homan's sign; TTP left posterior ankle along achilles tendon; full ROM maintained, some pain with flexion/extension; moves all toes appropriately; sensation intact  Neurological: She is alert and oriented to person, place, and time. She has normal strength. She displays no tremor. No cranial nerve deficit or sensory deficit. She displays no seizure activity.  Pt AAOx3, answering questions appropriately; CN grossly intact, moves all extremities appropriately without  ataxia, no focal neuro deficits or facial droop appreciated  Skin: Skin is warm and dry. She is not diaphoretic.  Psychiatric: She has a normal mood and affect.    ED Course  Procedures (including critical care time) Labs Review Labs Reviewed  BASIC METABOLIC PANEL - Abnormal; Notable for the following:    Glucose, Bld 101 (*)    Creatinine, Ser 1.27 (*)    GFR calc non Af Amer 44 (*)    GFR calc Af Amer 51 (*)    All other components within normal limits  CBC WITH DIFFERENTIAL  TROPONIN I   Imaging Review Dg Chest 2 View  12/25/2012   CLINICAL DATA:  Shortness of breath  EXAM: CHEST  2 VIEW  COMPARISON:  08/04/2009  FINDINGS: The cardiac shadow is stable. The lungs are well aerated bilaterally. Mild left basilar changes are seen. No sizable effusion is noted. No acute bony abnormality is seen.  IMPRESSION: Mild left basilar atelectasis.   Electronically Signed   By: Alcide Clever M.D.   On: 12/25/2012 12:41    EKG Interpretation   None       MDM   1. PAD (peripheral artery disease)   2. Headache   3. Claudication    Upon review of notes from prior PCP visits, sx appear unchanged from last week.  Headache without associated focal neuro deficits-- i doubt TIA, stroke, SAH, ICH, or meningitis at this time.  ABI's obtained, largely unchanged from previous reading in September.  SOB without associated chest pain-- CXR clear, O2 sats have remained stable on RA, no signs of respiratory distress.  Pt given medications in the ED with improvement of headache and leg pain.  Neuro exam remains normal.  Pt afebrile, non-toxic appearing, NAD, VS stable- ok for discharge.  Rx percocet.  Pt has previously scheduled FU with vascular surgery (12/28/12) and opthalmology.  Pt requests referral to neurology which was provided.  I have also encouraged her to discuss this ED visit with her PCP.  Signs/sx that would warrant return to the ED were discussed including dizziness, weakness, confusion,  changes in speech, abnormal gait, etc-- pt acknowledged understanding and agreed.  Discussed with Dr. Wilkie Aye who agrees with assessment and plan of care.  Garlon Hatchet, PA-C 12/25/12 1732  Garlon Hatchet, PA-C 12/25/12 1734

## 2012-12-27 ENCOUNTER — Encounter: Payer: Self-pay | Admitting: Vascular Surgery

## 2012-12-28 ENCOUNTER — Ambulatory Visit (HOSPITAL_COMMUNITY)
Admission: RE | Admit: 2012-12-28 | Discharge: 2012-12-28 | Disposition: A | Discharge status: Home / Self Care | Payer: Medicare Other | Source: Ambulatory Visit | Attending: Vascular Surgery | Admitting: Vascular Surgery

## 2012-12-28 ENCOUNTER — Other Ambulatory Visit: Payer: Self-pay | Admitting: *Deleted

## 2012-12-28 ENCOUNTER — Encounter: Payer: Self-pay | Admitting: *Deleted

## 2012-12-28 ENCOUNTER — Ambulatory Visit (INDEPENDENT_AMBULATORY_CARE_PROVIDER_SITE_OTHER): Payer: Medicare Other | Admitting: Vascular Surgery

## 2012-12-28 ENCOUNTER — Encounter: Payer: Self-pay | Admitting: Vascular Surgery

## 2012-12-28 VITALS — BP 162/92 | HR 96 | Resp 18 | Ht 61.25 in | Wt 218.0 lb

## 2012-12-28 DIAGNOSIS — Z01818 Encounter for other preprocedural examination: Secondary | ICD-10-CM

## 2012-12-28 DIAGNOSIS — I739 Peripheral vascular disease, unspecified: Secondary | ICD-10-CM | POA: Insufficient documentation

## 2012-12-28 NOTE — Progress Notes (Signed)
Subjective:     Patient ID: [name], female   DOB: 02/16/1950, 62 y.o.   MRN: [id number]  HPI this 62-year-old female is evaluated for bilateral calf claudication. She previously had a left superficial femoral artery stent placed at [facility] in 2012. She had relief of left calf claudication symptoms but they have returned. Currently her symptoms are equal in the left and right calves. She is able to walk about a quarter of a block before symptoms become severe. She has no history of rest pain or nonhealing ulcers or infection. She also has some buttock and thigh symptoms which are not as severe. She does smoke about one half pack of cigarettes per day and has smoked for 30+ years.  Past Medical History  Diagnosis Date  . Hypothyroidism   . Hypertension   . TIA (transient ischemic attack)   . PUD (peptic ulcer disease)   . HLD (hyperlipidemia)   . Depression   . GERD (gastroesophageal reflux disease)     History  Substance Use Topics  . Smoking status: Current Every Day Smoker -- 0.50 packs/day  . Smokeless tobacco: Not on file     Comment: try to quit  . Alcohol Use: No    No family history on file.  Allergies  Allergen Reactions  . Asa [Aspirin] Nausea And Vomiting  . Penicillins Hives and Itching    Current outpatient prescriptions:amLODipine (NORVASC) 10 MG tablet, Take 10 mg by mouth daily., Disp: , Rfl: ;  Cholecalciferol (VITAMIN D-3) 1000 UNITS CAPS, Take 2,000 Units by mouth daily., Disp: , Rfl: ;  clopidogrel (PLAVIX) 75 MG tablet, Take 75 mg by mouth daily., Disp: , Rfl: ;  cyclobenzaprine (FLEXERIL) 10 MG tablet, Take 10 mg by mouth daily as needed for muscle spasms., Disp: , Rfl:  doxepin (SINEQUAN) 100 MG capsule, Take 100 mg by mouth at bedtime., Disp: , Rfl: ;  levothyroxine (SYNTHROID, LEVOTHROID) 88 MCG tablet, Take 88 mcg by mouth daily before breakfast., Disp: , Rfl: ;  lisinopril (PRINIVIL,ZESTRIL) 40 MG tablet, Take 40 mg by mouth daily., Disp: , Rfl: ;   OVER THE COUNTER MEDICATION, Take 1 tablet by mouth daily. potassium, Disp: , Rfl:  oxyCODONE-acetaminophen (PERCOCET/ROXICET) 5-325 MG per tablet, Take 1 tablet by mouth every 4 (four) hours as needed., Disp: 10 tablet, Rfl: 0;  pantoprazole (PROTONIX) 40 MG tablet, Take 40 mg by mouth every morning., Disp: , Rfl: ;  sertraline (ZOLOFT) 100 MG tablet, Take 100 mg by mouth daily., Disp: , Rfl: ;  triamterene-hydrochlorothiazide (MAXZIDE-25) 37.5-25 MG per tablet, Take 1 tablet by mouth daily., Disp: , Rfl:  vitamin B-12 (CYANOCOBALAMIN) 100 MCG tablet, Take 100 mcg by mouth daily., Disp: , Rfl:   BP 162/92  Pulse 96  Resp 18  Ht 5' 1.25" (1.556 m)  Wt 218 lb (98.884 kg)  BMI 40.84 kg/m2  Body mass index is 40.84 kg/(m^2).          Review of Systems denies chest pain, dyspnea on exertion, PND, orthopnea, hemoptysis. Does complain of bilateral ankle discomfort. Other systems negative and a complete review of systems     Objective:   Physical Exam BP 162/92  Pulse 96  Resp 18  Ht 5' 1.25" (1.556 m)  Wt 218 lb (98.884 kg)  BMI 40.84 kg/m2  Gen.-alert and oriented x3 in no apparent distress HEENT normal for age Lungs no rhonchi or wheezing Cardiovascular regular rhythm no murmurs carotid pulses 3+ palpable no bruits audible Abdomen soft nontender   no palpable masses-obese Musculoskeletal free of  major deformities Skin clear -no rashes Neurologic normal Lower extremities 2+ femoral pulses bilaterally no popliteal or distal pulses palpable. Both feet are warm and adequately perfused no evidence of infection cellulitis gangrene or ulceration.  Today I ordered bilateral ABIs and duplex scan of both lower extremities. ABI on the right is 0.71 normal left is 0.64. She has diffuse plaque throughout both lower extremities. It is difficult to determine if the left SFA stent is patent. Her are multiple areas of mild to moderate stenosis in the right SFA which does appear patent.        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 Currently with limiting claudication left leg equal to right probably due to femoral-popliteal occlusive disease    Plan:     Plan aortobifemoral angiogram with bilateral lower extremity runoff on Thursday, January 8 by Dr. Chen. If feasible he will proceed with an intervention which I discussed today with the patient and may require to procedures. If intervention is not feasible we'll then have to discuss potential for bypass      

## 2012-12-29 ENCOUNTER — Ambulatory Visit: Payer: Self-pay | Admitting: Internal Medicine

## 2013-01-11 ENCOUNTER — Ambulatory Visit (HOSPITAL_COMMUNITY): Payer: Self-pay | Admitting: Psychiatry

## 2013-01-11 ENCOUNTER — Encounter (HOSPITAL_COMMUNITY): Payer: Self-pay | Admitting: Pharmacy Technician

## 2013-01-20 ENCOUNTER — Encounter (HOSPITAL_COMMUNITY)
Admission: RE | Disposition: A | Discharge status: Home / Self Care | Payer: Self-pay | Source: Ambulatory Visit | Attending: Vascular Surgery

## 2013-01-20 ENCOUNTER — Ambulatory Visit (HOSPITAL_COMMUNITY)
Admission: RE | Admit: 2013-01-20 | Discharge: 2013-01-21 | Disposition: A | Discharge status: Home / Self Care | Payer: Medicare Other | Source: Ambulatory Visit | Attending: Vascular Surgery | Admitting: Vascular Surgery

## 2013-01-20 ENCOUNTER — Telehealth: Payer: Self-pay | Admitting: Vascular Surgery

## 2013-01-20 DIAGNOSIS — R944 Abnormal results of kidney function studies: Secondary | ICD-10-CM | POA: Diagnosis not present

## 2013-01-20 DIAGNOSIS — K219 Gastro-esophageal reflux disease without esophagitis: Secondary | ICD-10-CM | POA: Diagnosis not present

## 2013-01-20 DIAGNOSIS — E669 Obesity, unspecified: Secondary | ICD-10-CM | POA: Diagnosis not present

## 2013-01-20 DIAGNOSIS — Z8673 Personal history of transient ischemic attack (TIA), and cerebral infarction without residual deficits: Secondary | ICD-10-CM | POA: Diagnosis not present

## 2013-01-20 DIAGNOSIS — F17200 Nicotine dependence, unspecified, uncomplicated: Secondary | ICD-10-CM | POA: Diagnosis not present

## 2013-01-20 DIAGNOSIS — I70219 Atherosclerosis of native arteries of extremities with intermittent claudication, unspecified extremity: Secondary | ICD-10-CM | POA: Insufficient documentation

## 2013-01-20 DIAGNOSIS — I10 Essential (primary) hypertension: Secondary | ICD-10-CM | POA: Diagnosis not present

## 2013-01-20 DIAGNOSIS — E785 Hyperlipidemia, unspecified: Secondary | ICD-10-CM | POA: Insufficient documentation

## 2013-01-20 DIAGNOSIS — I708 Atherosclerosis of other arteries: Secondary | ICD-10-CM | POA: Diagnosis not present

## 2013-01-20 DIAGNOSIS — I739 Peripheral vascular disease, unspecified: Secondary | ICD-10-CM | POA: Diagnosis present

## 2013-01-20 DIAGNOSIS — F411 Generalized anxiety disorder: Secondary | ICD-10-CM | POA: Insufficient documentation

## 2013-01-20 DIAGNOSIS — Z6841 Body Mass Index (BMI) 40.0 and over, adult: Secondary | ICD-10-CM | POA: Diagnosis not present

## 2013-01-20 DIAGNOSIS — Z7902 Long term (current) use of antithrombotics/antiplatelets: Secondary | ICD-10-CM | POA: Diagnosis not present

## 2013-01-20 DIAGNOSIS — R269 Unspecified abnormalities of gait and mobility: Secondary | ICD-10-CM | POA: Insufficient documentation

## 2013-01-20 DIAGNOSIS — F332 Major depressive disorder, recurrent severe without psychotic features: Secondary | ICD-10-CM | POA: Diagnosis not present

## 2013-01-20 DIAGNOSIS — Z01818 Encounter for other preprocedural examination: Secondary | ICD-10-CM

## 2013-01-20 HISTORY — PX: ABDOMINAL AORTAGRAM: SHX5454

## 2013-01-20 LAB — POCT I-STAT, CHEM 8
BUN: 20 mg/dL (ref 6–23)
CREATININE: 1.5 mg/dL — AB (ref 0.50–1.10)
Calcium, Ion: 1.18 mmol/L (ref 1.13–1.30)
Chloride: 103 mEq/L (ref 96–112)
GLUCOSE: 111 mg/dL — AB (ref 70–99)
HEMATOCRIT: 45 % (ref 36.0–46.0)
HEMOGLOBIN: 15.3 g/dL — AB (ref 12.0–15.0)
Potassium: 4 mEq/L (ref 3.7–5.3)
SODIUM: 142 meq/L (ref 137–147)
TCO2: 26 mmol/L (ref 0–100)

## 2013-01-20 LAB — CBC
HCT: 39.3 % (ref 36.0–46.0)
HEMOGLOBIN: 12.9 g/dL (ref 12.0–15.0)
MCH: 31.1 pg (ref 26.0–34.0)
MCHC: 32.8 g/dL (ref 30.0–36.0)
MCV: 94.7 fL (ref 78.0–100.0)
PLATELETS: 151 10*3/uL (ref 150–400)
RBC: 4.15 MIL/uL (ref 3.87–5.11)
RDW: 14.7 % (ref 11.5–15.5)
WBC: 4.9 10*3/uL (ref 4.0–10.5)

## 2013-01-20 LAB — POCT ACTIVATED CLOTTING TIME
ACTIVATED CLOTTING TIME: 254 s
ACTIVATED CLOTTING TIME: 166 s
Activated Clotting Time: 193 seconds

## 2013-01-20 LAB — CREATININE, SERUM
Creatinine, Ser: 1.3 mg/dL — ABNORMAL HIGH (ref 0.50–1.10)
GFR, EST AFRICAN AMERICAN: 49 mL/min — AB (ref >90)
GFR, EST NON AFRICAN AMERICAN: 42 mL/min — AB (ref >90)

## 2013-01-20 SURGERY — ABDOMINAL AORTAGRAM
Anesthesia: LOCAL

## 2013-01-20 MED ORDER — SODIUM CHLORIDE 0.9 % IV SOLN: INTRAVENOUS | Status: DC

## 2013-01-20 MED ORDER — FENTANYL CITRATE 0.05 MG/ML IJ SOLN
INTRAMUSCULAR | Status: AC
  Filled 2013-01-20: qty 2

## 2013-01-20 MED ORDER — MIDAZOLAM HCL 2 MG/2ML IJ SOLN
INTRAMUSCULAR | Status: AC
  Filled 2013-01-20: qty 2

## 2013-01-20 MED ORDER — ENOXAPARIN SODIUM 40 MG/0.4ML ~~LOC~~ SOLN
40.0000 mg | SUBCUTANEOUS | Status: DC
  Filled 2013-01-20 (×2): qty 0.4

## 2013-01-20 MED ORDER — TRIAMTERENE-HCTZ 37.5-25 MG PO TABS
1.0000 | ORAL_TABLET | Freq: Every day | ORAL | Status: DC
  Administered 2013-01-21: 1 via ORAL
  Filled 2013-01-20 (×2): qty 1

## 2013-01-20 MED ORDER — ENOXAPARIN SODIUM 40 MG/0.4ML ~~LOC~~ SOLN: 40.0000 mg | SUBCUTANEOUS | Status: DC

## 2013-01-20 MED ORDER — VITAMIN B-12 100 MCG PO TABS
100.0000 ug | ORAL_TABLET | Freq: Every day | ORAL | Status: DC
  Administered 2013-01-21: 100 ug via ORAL
  Filled 2013-01-20 (×2): qty 1

## 2013-01-20 MED ORDER — HEPARIN (PORCINE) IN NACL 2-0.9 UNIT/ML-% IJ SOLN
INTRAMUSCULAR | Status: AC
  Filled 2013-01-20: qty 1000

## 2013-01-20 MED ORDER — SENNOSIDES-DOCUSATE SODIUM 8.6-50 MG PO TABS
1.0000 | ORAL_TABLET | Freq: Every evening | ORAL | Status: DC | PRN
  Filled 2013-01-20: qty 1

## 2013-01-20 MED ORDER — LEVOTHYROXINE SODIUM 88 MCG PO TABS
88.0000 ug | ORAL_TABLET | Freq: Every day | ORAL | Status: DC
  Administered 2013-01-21: 88 ug via ORAL
  Filled 2013-01-20 (×2): qty 1

## 2013-01-20 MED ORDER — BISACODYL 10 MG RE SUPP: 10.0000 mg | Freq: Every day | RECTAL | Status: DC | PRN

## 2013-01-20 MED ORDER — HEPARIN SODIUM (PORCINE) 1000 UNIT/ML IJ SOLN
INTRAMUSCULAR | Status: AC
  Filled 2013-01-20: qty 1

## 2013-01-20 MED ORDER — CYCLOBENZAPRINE HCL 10 MG PO TABS
10.0000 mg | ORAL_TABLET | Freq: Every day | ORAL | Status: DC | PRN
  Administered 2013-01-20: 10 mg via ORAL
  Filled 2013-01-20: qty 1

## 2013-01-20 MED ORDER — DOXEPIN HCL 100 MG PO CAPS
100.0000 mg | ORAL_CAPSULE | Freq: Every day | ORAL | Status: DC
  Administered 2013-01-20: 100 mg via ORAL
  Filled 2013-01-20 (×3): qty 1

## 2013-01-20 MED ORDER — AMLODIPINE BESYLATE 10 MG PO TABS
10.0000 mg | ORAL_TABLET | Freq: Every day | ORAL | Status: DC
  Administered 2013-01-20: 10 mg via ORAL
  Filled 2013-01-20 (×2): qty 1

## 2013-01-20 MED ORDER — MORPHINE SULFATE 2 MG/ML IJ SOLN: 2.0000 mg | INTRAMUSCULAR | Status: DC | PRN

## 2013-01-20 MED ORDER — LIDOCAINE HCL (PF) 1 % IJ SOLN
INTRAMUSCULAR | Status: AC
  Filled 2013-01-20: qty 30

## 2013-01-20 MED ORDER — OXYCODONE HCL 5 MG PO TABS: 5.0000 mg | ORAL_TABLET | ORAL | Status: DC | PRN

## 2013-01-20 MED ORDER — ONDANSETRON HCL 4 MG/2ML IJ SOLN: 4.0000 mg | Freq: Four times a day (QID) | INTRAMUSCULAR | Status: DC | PRN

## 2013-01-20 MED ORDER — CLOPIDOGREL BISULFATE 75 MG PO TABS
75.0000 mg | ORAL_TABLET | Freq: Every day | ORAL | Status: DC
  Administered 2013-01-20 – 2013-01-21 (×2): 75 mg via ORAL
  Filled 2013-01-20 (×2): qty 1

## 2013-01-20 MED ORDER — PHENOL 1.4 % MT LIQD: 1.0000 | OROMUCOSAL | Status: DC | PRN

## 2013-01-20 MED ORDER — OXYCODONE-ACETAMINOPHEN 5-325 MG PO TABS
1.0000 | ORAL_TABLET | Freq: Once | ORAL | Status: DC
  Filled 2013-01-20: qty 1

## 2013-01-20 MED ORDER — SERTRALINE HCL 100 MG PO TABS
100.0000 mg | ORAL_TABLET | Freq: Every day | ORAL | Status: DC
  Administered 2013-01-20 – 2013-01-21 (×2): 100 mg via ORAL
  Filled 2013-01-20 (×2): qty 1

## 2013-01-20 MED ORDER — LISINOPRIL 40 MG PO TABS
40.0000 mg | ORAL_TABLET | Freq: Every day | ORAL | Status: DC
  Administered 2013-01-21: 40 mg via ORAL
  Filled 2013-01-20 (×2): qty 1

## 2013-01-20 MED ORDER — PANTOPRAZOLE SODIUM 40 MG PO TBEC
40.0000 mg | DELAYED_RELEASE_TABLET | Freq: Every morning | ORAL | Status: DC
  Administered 2013-01-21: 40 mg via ORAL
  Filled 2013-01-20: qty 1

## 2013-01-20 NOTE — Op Note (Signed)
OPERATIVE NOTE   PROCEDURE: 1.  Right common femoral artery cannulation under ultrasound guidance 2.  Placement of catheter in aorta 3.  Aortogram with carbon dioxide 4.  Third order arterial selection 5.  Angioplasty and stenting of left external iliac artery (7 mm x 19 mm, 7 mm x 29 mm) 6.  Left leg runoff 7.  Right leg runoff  PRE-OPERATIVE DIAGNOSIS: bilateral leg intermittent claudication   POST-OPERATIVE DIAGNOSIS: same as above   SURGEON: Adele Barthel, MD  ANESTHESIA: conscious sedation  ESTIMATED BLOOD LOSS: 50 cc  CONTRAST: 104 cc  FINDING(S):  Aorta: Patent  Superior mesenteric artery: Patent Celiac artery: Patent   Right Left  RA Patent Patent  CIA Patent Patent  EIA Patent with high takeoff, <50% stenosis in mid-segment Patent with high takeoff, proximal stenosis 50-75% and mid-segment stenosis >90%: both resolved with stenting  IIA Patent with aberrant high take off Patent with aberrant high take off  CFA Patent but diseased with 50% stenosis Patent  SFA Patent but diffusely disease with proximal near occlusion Patent but diffusely diseased with patent distal superficial femoral artery stent  PFA Patent Patent  Pop Patent Patent  Trif Patent with aberrant PT takeoff and common AT and peroneal trunk Patent  AT Occludes shortly after takeoff Occludes shortly after takeoff  Pero Patent to ankle without significant branches to foot Patent: dominant runoff to foot  PT Patent with only visible runoff to foot Patent with 75-90% stenosis proximally   SPECIMEN(S):  none  INDICATIONS:   [name] is a 63 y.o. female who presents with bilateral short distance intermittent claudication.  The patient presents for: aortogram, bilateral leg runoff, possible left leg runoff.  I discussed with the patient the nature of angiographic procedures, especially the limited patencies of any endovascular intervention.  The patient is aware of that the risks of an  angiographic procedure include but are not limited to: bleeding, infection, access site complications, renal failure, embolization, rupture of vessel, dissection, possible need for emergent surgical intervention, possible need for surgical procedures to treat the patient's pathology, and stroke and death.  The patient is aware of the risks and agrees to proceed.  DESCRIPTION: After full informed consent was obtained from the patient, the patient was brought back to the angiography suite.  The patient was placed supine upon the angiography table and connected to monitoring equipment.  The patient was then given conscious sedation, the amounts of which are documented in the patient's chart.  The patient was prepped and drape in the standard fashion for an angiographic procedure.  At this point, attention was turned to the right groin.  Under ultrasound guidance, the right common femoral artery was cannulated with a micropuncture needle.  The microwire was advanced into the iliac arterial system.  The needle was exchanged for a microsheath, which was loaded into the common femoral artery over the wire.  The microwire was exchanged for a [facility] wire which was advanced into the aorta.  The microsheath was then exchanged for a 5-Fr sheath which was loaded into the common femoral artery.  The Omniflush catheter was then loaded over the wire up to the level of L1.  The catheter was connected to the carbon dioxide circuit due to elevated creatinine, suggesting early renal insufficiency.  A carbon dioxide aortogram was completed.  I pulled the catheter down to distal aorta and a pelvic injection was completed.  There was suggestion of disease in the iliac segments, so the catheter  was connected to the power injector.  A power injector pelvic angiogram was completed.  This demonstrated hemodynamically significant stenoses in left external iliac artery which needed intervention.  The [facility] wire was replaced in the catheter,  and using the [facility] and Crossover catheter, the left common iliac artery was selected.  The wire would not cross this bifurcation, so I exchanged the wire for the Glidewire.  I steered the wire through the two stenoses into the superficial femoral artery.  I advanced the catheter into the left superficial femoral artery.  The wire was exchanged for a Rosen wire.  The patient's right femoral sheath was exchanged for a 7-Fr Ansel sheath, which was lodged in the left external iliac artery distal to the two stenoses.  The dilator was removed.  The patient was given 8000 units of Heparin intravenously, which was a therapeutic bolus.    At this point, based on measurements, I selected a balloon expandable 7 mm x 19 mm stent and centered it on the mid-segment stenosis, pulling the sheath back to uncover the stent.  I confirmed position of the stenosis and stent prior to deployment of the stent at 11 atm.  I then pulled the sheath into the intervening external iliac artery segment and loaded the next stent, a balloon expandable 7 mm x 29 mm stent.  This stent was positioned to avoid covering the left internal iliac artery.  I pulled back the sheath and did a hand injection to verify position.  The stent was deployed at 11 atm.  Completion injection demonstrated resolution of the two stenosis.  Over the wire, I loaded an Omniflush catheter and loaded it into the left external iliac artery.  An automated left leg runoff was completed.  I replaced the wire into the catheter, straightening out the crook in the catheter.  Both were removed from the sheath together.  The wire and sheath were pulled into the right external iliac artery.  The sheath was aspirated.  No clots were present and the sheath was reloaded with heparinized saline.  The sheath was exchanged for a normal length 7-Fr sheath.  The wire was removed.  The sheath was aspirated.  No clots were present and the sheath was reloaded with heparinized saline.     COMPLICATIONS: none  CONDITION: stable  Adele Barthel, MD [facility] [place] Office: [phone] Pager: [phone]  01/20/2013, 1:22 PM

## 2013-01-20 NOTE — Telephone Encounter (Signed)
Spoke with patient's cousin, was in a hurry. Asked me to mail appt info. Letter mailed - kf

## 2013-01-20 NOTE — H&P (View-Only) (Signed)
Subjective:     Patient ID: [name], female   DOB: 1950/10/18, 63 y.o.   MRN: [id number]  HPI this 63 year old female is evaluated for bilateral calf claudication. She previously had a left superficial femoral artery stent placed at [facility] in 2012. She had relief of left calf claudication symptoms but they have returned. Currently her symptoms are equal in the left and right calves. She is able to walk about a quarter of a block before symptoms become severe. She has no history of rest pain or nonhealing ulcers or infection. She also has some buttock and thigh symptoms which are not as severe. She does smoke about one half pack of cigarettes per day and has smoked for 30+ years.  Past Medical History  Diagnosis Date  . Hypothyroidism   . Hypertension   . TIA (transient ischemic attack)   . PUD (peptic ulcer disease)   . HLD (hyperlipidemia)   . Depression   . GERD (gastroesophageal reflux disease)     History  Substance Use Topics  . Smoking status: Current Every Day Smoker -- 0.50 packs/day  . Smokeless tobacco: Not on file     Comment: try to quit  . Alcohol Use: No    No family history on file.  Allergies  Allergen Reactions  . Asa [Aspirin] Nausea And Vomiting  . Penicillins Hives and Itching    Current outpatient prescriptions:amLODipine (NORVASC) 10 MG tablet, Take 10 mg by mouth daily., Disp: , Rfl: ;  Cholecalciferol (VITAMIN D-3) 1000 UNITS CAPS, Take 2,000 Units by mouth daily., Disp: , Rfl: ;  clopidogrel (PLAVIX) 75 MG tablet, Take 75 mg by mouth daily., Disp: , Rfl: ;  cyclobenzaprine (FLEXERIL) 10 MG tablet, Take 10 mg by mouth daily as needed for muscle spasms., Disp: , Rfl:  doxepin (SINEQUAN) 100 MG capsule, Take 100 mg by mouth at bedtime., Disp: , Rfl: ;  levothyroxine (SYNTHROID, LEVOTHROID) 88 MCG tablet, Take 88 mcg by mouth daily before breakfast., Disp: , Rfl: ;  lisinopril (PRINIVIL,ZESTRIL) 40 MG tablet, Take 40 mg by mouth daily., Disp: , Rfl: ;   OVER THE COUNTER MEDICATION, Take 1 tablet by mouth daily. potassium, Disp: , Rfl:  oxyCODONE-acetaminophen (PERCOCET/ROXICET) 5-325 MG per tablet, Take 1 tablet by mouth every 4 (four) hours as needed., Disp: 10 tablet, Rfl: 0;  pantoprazole (PROTONIX) 40 MG tablet, Take 40 mg by mouth every morning., Disp: , Rfl: ;  sertraline (ZOLOFT) 100 MG tablet, Take 100 mg by mouth daily., Disp: , Rfl: ;  triamterene-hydrochlorothiazide (MAXZIDE-25) 37.5-25 MG per tablet, Take 1 tablet by mouth daily., Disp: , Rfl:  vitamin B-12 (CYANOCOBALAMIN) 100 MCG tablet, Take 100 mcg by mouth daily., Disp: , Rfl:   BP 162/92  Pulse 96  Resp 18  Ht 5' 1.25" (1.556 m)  Wt 218 lb (98.884 kg)  BMI 40.84 kg/m2  Body mass index is 40.84 kg/(m^2).          Review of Systems denies chest pain, dyspnea on exertion, PND, orthopnea, hemoptysis. Does complain of bilateral ankle discomfort. Other systems negative and a complete review of systems     Objective:   Physical Exam BP 162/92  Pulse 96  Resp 18  Ht 5' 1.25" (1.556 m)  Wt 218 lb (98.884 kg)  BMI 40.84 kg/m2  Gen.-alert and oriented x3 in no apparent distress HEENT normal for age Lungs no rhonchi or wheezing Cardiovascular regular rhythm no murmurs carotid pulses 3+ palpable no bruits audible Abdomen soft nontender  no palpable masses-obese Musculoskeletal free of  major deformities Skin clear -no rashes Neurologic normal Lower extremities 2+ femoral pulses bilaterally no popliteal or distal pulses palpable. Both feet are warm and adequately perfused no evidence of infection cellulitis gangrene or ulceration.  Today I ordered bilateral ABIs and duplex scan of both lower extremities. ABI on the right is 0.71 normal left is 0.64. She has diffuse plaque throughout both lower extremities. It is difficult to determine if the left SFA stent is patent. Her are multiple areas of mild to moderate stenosis in the right SFA which does appear patent.        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 Currently with limiting claudication left leg equal to right probably due to femoral-popliteal occlusive disease    Plan:     Plan aortobifemoral angiogram with bilateral lower extremity runoff on Thursday, January 8 by Dr. Bridgett Larsson. If feasible he will proceed with an intervention which I discussed today with the patient and may require to procedures. If intervention is not feasible we'll then have to discuss potential for bypass

## 2013-01-20 NOTE — Telephone Encounter (Signed)
Message copied by Berniece Salines on Thu Jan 20, 2013  2:55 PM ------      Message from: Alfonso Patten      Created: Thu Jan 20, 2013  2:25 PM                   ----- Message -----         From: Conrad Frankfort, MD         Sent: 01/20/2013   2:16 PM           To: Patrici Ranks, Alfonso Patten, RN            [name]      [id number]      September 29, 1950            PROCEDURE:      1.  Right common femoral artery cannulation under ultrasound guidance      2.  Placement of catheter in aorta      3.  Aortogram with carbon dioxide      4.  Third order arterial selection      5.  Angioplasty and stenting of left external iliac artery (7 mm x 19 mm, 7 mm x 29 mm)      6.  Left leg runoff      7.  Right leg runoff            Follow-up: 4 weeks with Dr.  Kellie Simmering             ------

## 2013-01-20 NOTE — Progress Notes (Signed)
STATES NO HEADACHE OR LEG PAIN NOW

## 2013-01-20 NOTE — Interval H&P Note (Signed)
[facility] [place]  History and Physical Update  The patient was interviewed and re-examined.  The patient's previous History and Physical has been reviewed and is unchanged from Dr. Evelena Leyden consult.  There is no change in the plan of care: aortogram, bilateral leg runoff, and possible intervention.  Adele Barthel, MD [facility] [place] Office: [phone] Pager: [phone]  01/20/2013, 9:24 AM

## 2013-01-20 NOTE — Progress Notes (Signed)
STATES HAS NOBODY TO STAY WITH HER TONIGHT

## 2013-01-21 ENCOUNTER — Ambulatory Visit (HOSPITAL_COMMUNITY): Payer: Self-pay | Admitting: Psychiatry

## 2013-01-21 LAB — CBC
HEMATOCRIT: 40.5 % (ref 36.0–46.0)
Hemoglobin: 13.3 g/dL (ref 12.0–15.0)
MCH: 31.1 pg (ref 26.0–34.0)
MCHC: 32.8 g/dL (ref 30.0–36.0)
MCV: 94.6 fL (ref 78.0–100.0)
PLATELETS: 153 10*3/uL (ref 150–400)
RBC: 4.28 MIL/uL (ref 3.87–5.11)
RDW: 14.7 % (ref 11.5–15.5)
WBC: 7.2 10*3/uL (ref 4.0–10.5)

## 2013-01-21 LAB — BASIC METABOLIC PANEL
BUN: 10 mg/dL (ref 6–23)
CALCIUM: 8.4 mg/dL (ref 8.4–10.5)
CO2: 26 mEq/L (ref 19–32)
Chloride: 105 mEq/L (ref 96–112)
Creatinine, Ser: 1.17 mg/dL — ABNORMAL HIGH (ref 0.50–1.10)
GFR calc non Af Amer: 48 mL/min — ABNORMAL LOW (ref >90)
GFR, EST AFRICAN AMERICAN: 56 mL/min — AB (ref >90)
GLUCOSE: 117 mg/dL — AB (ref 70–99)
POTASSIUM: 4.4 meq/L (ref 3.7–5.3)
SODIUM: 141 meq/L (ref 137–147)

## 2013-01-21 NOTE — Discharge Summary (Signed)
[facility] Discharge Summary  [name] 02-Apr-1950 63 y.o. female  [id number]  Admission Date: 01/20/2013  Discharge Date: 01/21/13  Physician: Conrad [place], MD  Admission Diagnosis: PVD   HPI:   This is a 63 y.o. female is evaluated for bilateral calf claudication. She previously had a left superficial femoral artery stent placed at [facility] in 2012. She had relief of left calf claudication symptoms but they have returned. Currently her symptoms are equal in the left and right calves. She is able to walk about a quarter of a block before symptoms become severe. She has no history of rest pain or nonhealing ulcers or infection. She also has some buttock and thigh symptoms which are not as severe. She does smoke about one half pack of cigarettes per day and has smoked for 30+ years.  Hospital Course:  The patient was admitted to the hospital and taken to the operating room on 01/20/2013 and underwent: 1. Right common femoral artery cannulation under ultrasound guidance  2. Placement of catheter in aorta  3. Aortogram with carbon dioxide  4. Third order arterial selection  5. Angioplasty and stenting of left external iliac artery (7 mm x 19 mm, 7 mm x 29 mm)  6. Left leg runoff  7. Right leg runoff    The pt tolerated the procedure well and was transported to the PACU in good condition. Pt did well and was discharged the next morning.  The remainder of the hospital course consisted of increasing mobilization and increasing intake of solids without difficulty.  CBC    Component Value Date/Time   WBC 7.2 01/21/2013 0510   RBC 4.28 01/21/2013 0510   HGB 13.3 01/21/2013 0510   HCT 40.5 01/21/2013 0510   PLT 153 01/21/2013 0510   MCV 94.6 01/21/2013 0510   MCH 31.1 01/21/2013 0510   MCHC 32.8 01/21/2013 0510   RDW 14.7 01/21/2013 0510   LYMPHSABS 2.4 12/25/2012 1210   MONOABS 0.3 12/25/2012 1210   EOSABS 0.1 12/25/2012 1210   BASOSABS 0.0 12/25/2012 1210    BMET     Component Value Date/Time   NA 141 01/21/2013 0510   K 4.4 01/21/2013 0510   CL 105 01/21/2013 0510   CO2 26 01/21/2013 0510   GLUCOSE 117* 01/21/2013 0510   BUN 10 01/21/2013 0510   CREATININE 1.17* 01/21/2013 0510   CREATININE 1.41* 12/20/2012 1019   CALCIUM 8.4 01/21/2013 0510   CALCIUM 10.2 11/16/2009 2219   GFRNONAA 48* 01/21/2013 0510   GFRAA 56* 01/21/2013 0510     Discharge Instructions:   The patient is discharged to home with extensive instructions on wound care and progressive ambulation.  They are instructed not to drive or perform any heavy lifting until returning to see the physician in his office.  Discharge Orders   Future Appointments Provider Department Dept Phone   01/28/2013 9:15 AM Duwaine Maxin, [facility] [place] [phone]   02/22/2013 8:45 AM Mal Misty, MD [facility] [phone]   Future Orders Complete By Expires   Call MD for:  redness, tenderness, or signs of infection (pain, swelling, bleeding, redness, odor or green/yellow discharge around incision site)  As directed    Call MD for:  severe or increased pain, loss or decreased feeling  in affected limb(s)  As directed    Call MD for:  temperature >100.5  As directed    Driving Restrictions  As directed  Comments:     No driving for 2 days   I-Stat, Chem 8  As directed    Increase activity slowly  As directed    Comments:     Walk with assistance use walker or cane as needed.  No lifting > 10 lb for 2 days   Lifting restrictions  As directed    Comments:     No lifting >10 lb for 2 days   Resume previous diet  As directed       Discharge Diagnosis:  PVD  Secondary Diagnosis: Patient Active Problem List   Diagnosis Date Noted  . Intermittent claudication 01/20/2013  . PVD (peripheral vascular disease) with claudication 12/28/2012  . Eye mass 12/13/2012  . Cataract 12/13/2012  . Frequent headaches 12/13/2012  . Major depressive disorder, recurrent  episode, severe, without mention of psychotic behavior 10/29/2012  . Anxiety state, unspecified 09/20/2012  . Abnormality of gait 04/21/2012  . PAD (peripheral artery disease) 04/15/2012  . Hyperlipidemia 04/15/2012  . Hypertension 04/15/2012  . Hypothyroidism 04/15/2012  . GERD (gastroesophageal reflux disease) 04/15/2012  . Routine health maintenance 04/15/2012   Past Medical History  Diagnosis Date  . Hypothyroidism   . Hypertension   . TIA (transient ischemic attack)   . PUD (peptic ulcer disease)   . HLD (hyperlipidemia)   . Depression   . GERD (gastroesophageal reflux disease)        Medication List         amLODipine 10 MG tablet  Commonly known as:  NORVASC  Take 10 mg by mouth daily.     clopidogrel 75 MG tablet  Commonly known as:  PLAVIX  Take 75 mg by mouth daily.     cyclobenzaprine 10 MG tablet  Commonly known as:  FLEXERIL  Take 10 mg by mouth daily as needed for muscle spasms.     doxepin 100 MG capsule  Commonly known as:  SINEQUAN  Take 100 mg by mouth at bedtime.     GAS-X PO  Take 1 tablet by mouth every 6 (six) hours as needed (for flatulence).     levothyroxine 88 MCG tablet  Commonly known as:  SYNTHROID, LEVOTHROID  Take 88 mcg by mouth daily before breakfast.     lisinopril 40 MG tablet  Commonly known as:  PRINIVIL,ZESTRIL  Take 40 mg by mouth daily.     pantoprazole 40 MG tablet  Commonly known as:  PROTONIX  Take 40 mg by mouth every morning.     potassium gluconate 595 MG Tabs tablet  Take 595 mg by mouth daily.     sertraline 100 MG tablet  Commonly known as:  ZOLOFT  Take 100 mg by mouth daily.     triamterene-hydrochlorothiazide 37.5-25 MG per tablet  Commonly known as:  MAXZIDE-25  Take 1 tablet by mouth daily.     vitamin B-12 100 MCG tablet  Commonly known as:  CYANOCOBALAMIN  Take 100 mcg by mouth daily.     Vitamin D-3 1000 UNITS Caps  Take 2,000 Units by mouth daily.          Disposition:  home  Patient's condition: is Good  Follow up: 1. Dr. Kellie Simmering in 2 weeks   Leontine Locket, PA-C [facility] [phone] 01/21/2013  9:19 AM  Addendum  I have independently interviewed and examined the patient, and I agree with the physician assistant's discharge summary.  This patient had a successful endovascular procedure with uneventful post-procedural course.  She will  follow up with Dr. Kellie Simmering in two weeks to review her options in regards to her right leg perfusion.  Adele Barthel, MD [facility] [place] Office: [phone] Pager: [phone]  01/21/2013, 10:17 AM

## 2013-01-21 NOTE — Discharge Instructions (Signed)
Angiography, Care After °Refer to this sheet in the next few weeks. These instructions provide you with information on caring for yourself after your procedure. Your health care provider may also give you more specific instructions. Your treatment has been planned according to current medical practices, but problems sometimes occur. Call your health care provider if you have any problems or questions after your procedure.  °WHAT TO EXPECT AFTER THE PROCEDURE °After your procedure, it is typical to have the following sensations: °· Minor discomfort or tenderness and a small bump at the catheter insertion site. The bump should usually decrease in size and tenderness within 1 to 2 weeks. °· Any bruising will usually fade within 2 to 4 weeks. °HOME CARE INSTRUCTIONS  °· You may need to keep taking blood thinners if they were prescribed for you. Only take over-the-counter or prescription medicines for pain, fever, or discomfort as directed by your health care provider. °· Do not apply powder or lotion to the site. °· Do not sit in a bathtub, swimming pool, or whirlpool for 5 to 7 days. °· You may shower 24 hours after the procedure. Remove the bandage (dressing) and gently wash the site with plain soap and water. Gently pat the site dry. °· Inspect the site at least twice daily. °· Limit your activity for the first 48 hours. Do not bend, squat, or lift anything over 20 lb (9 kg) or as directed by your health care provider. °· Do not drive home if you are discharged the day of the procedure. Have someone else drive you. Follow instructions about when you can drive or return to work. °SEEK MEDICAL CARE IF: °· You get lightheaded when standing up. °· You have drainage (other than a small amount of blood on the dressing). °· You have chills. °· You have a fever. °· You have redness, warmth, swelling, or pain at the insertion site. °SEEK IMMEDIATE MEDICAL CARE IF:  °· You develop chest pain or shortness of breath, feel faint,  or pass out. °· You have bleeding, swelling larger than a walnut, or drainage from the catheter insertion site. °· You develop pain, discoloration, coldness, or severe bruising in the leg or arm that held the catheter. °· You develop bleeding from any other place, such as the bowels. You may see bright red blood in your urine or stools, or your stools may appear black and tarry. °· You have heavy bleeding from the site. If this happens, hold pressure on the site. °MAKE SURE YOU: °· Understand these instructions. °· Will watch your condition. °· Will get help right away if you are not doing well or get worse. °Document Released: 07/18/2004 Document Revised: 09/01/2012 Document Reviewed: 05/24/2012 °Exit[employer]® Patient Information ©2014 [employer]. ° °

## 2013-01-21 NOTE — Progress Notes (Signed)
   Daily Progress Note  Assessment/Planning: POD #1 s/p L EIA PTA+S   No access complication  No evidence of CIN  Ok to D/C  Subjective  - 1 Day Post-Op  Continue bilateral leg pain unchanged  Objective Filed Vitals:   01/20/13 2000 01/20/13 2021 01/20/13 2358 01/21/13 0400  BP: 141/86 141/71 147/84 151/77  Pulse: 80  79 90  Temp: 97.7 F (36.5 C)  97.8 F (36.6 C) 97.5 F (36.4 C)  TempSrc:      Resp: 18  18 18   Height:      Weight:      SpO2: 98%  99% 95%    Intake/Output Summary (Last 24 hours) at 01/21/13 0914 Last data filed at 01/21/13 0859  Gross per 24 hour  Intake    240 ml  Output      0 ml  Net    240 ml    PULM  CTAB CV  RRR GI  soft, NTND VASC  R groin without hematoma or pseudoaneurysm  Laboratory CBC    Component Value Date/Time   WBC 7.2 01/21/2013 0510   HGB 13.3 01/21/2013 0510   HCT 40.5 01/21/2013 0510   PLT 153 01/21/2013 0510    BMET    Component Value Date/Time   NA 141 01/21/2013 0510   K 4.4 01/21/2013 0510   CL 105 01/21/2013 0510   CO2 26 01/21/2013 0510   GLUCOSE 117* 01/21/2013 0510   BUN 10 01/21/2013 0510   CREATININE 1.17* 01/21/2013 0510   CREATININE 1.41* 12/20/2012 1019   CALCIUM 8.4 01/21/2013 0510   CALCIUM 10.2 11/16/2009 2219   GFRNONAA 48* 01/21/2013 0510   GFRAA 56* 01/21/2013 0510    Adele Barthel, MD [facility] [place] Office: [phone] Pager: [phone]  01/21/2013, 9:14 AM

## 2013-01-21 NOTE — Progress Notes (Signed)
Pt discharged to home per MD order. Pt received and reviewed all discharge instructions and medication information including follow-up and prescription information. Pt verbalized understanding.  Pt IV and telemetry box removed prior to discharge. Pt alert and oriented at discharge with no complaints of pain. Pt escorted to private vehicle via wheelchair by guest services volunteer. Lenna Sciara

## 2013-01-25 ENCOUNTER — Telehealth: Payer: Self-pay | Admitting: Vascular Surgery

## 2013-01-25 NOTE — Telephone Encounter (Addendum)
Message copied by Gena Fray on Tue Jan 25, 2013 11:30 AM ------      Message from: Conrad Moscow      Created: Mon Jan 24, 2013  1:34 PM      Regarding: RE: Follow up question        2 wks with lawson      ----- Message -----         From: Gena Fray         Sent: 01/24/2013   1:24 PM           To: Conrad [place], MD      Subject: Follow up question                                       Dr Bridgett Larsson,            Ms Bortle called regarding her follow up. I scheduled her for a 4 week follow up with [facility], as per your staff message on 01/20/13. She thought you had told her 2 weeks and she asked me to verify the correct follow up with you. She is s/p angioplasty.            Thanks,      Hinton Dyer        ------  01/25/13: spoke with pt to r.s to 2 wks instead of 4, dpm

## 2013-01-28 ENCOUNTER — Encounter: Payer: Self-pay | Admitting: Internal Medicine

## 2013-01-28 ENCOUNTER — Ambulatory Visit (INDEPENDENT_AMBULATORY_CARE_PROVIDER_SITE_OTHER): Payer: Medicare Other | Admitting: Internal Medicine

## 2013-01-28 VITALS — BP 124/83 | HR 80 | Temp 97.9°F | Ht 60.0 in | Wt 217.8 lb

## 2013-01-28 DIAGNOSIS — R310 Gross hematuria: Secondary | ICD-10-CM

## 2013-01-28 DIAGNOSIS — I10 Essential (primary) hypertension: Secondary | ICD-10-CM | POA: Diagnosis not present

## 2013-01-28 DIAGNOSIS — E039 Hypothyroidism, unspecified: Secondary | ICD-10-CM | POA: Diagnosis not present

## 2013-01-28 DIAGNOSIS — R319 Hematuria, unspecified: Secondary | ICD-10-CM

## 2013-01-28 DIAGNOSIS — K921 Melena: Secondary | ICD-10-CM

## 2013-01-28 DIAGNOSIS — R7309 Other abnormal glucose: Secondary | ICD-10-CM | POA: Diagnosis not present

## 2013-01-28 DIAGNOSIS — E785 Hyperlipidemia, unspecified: Secondary | ICD-10-CM | POA: Diagnosis not present

## 2013-01-28 DIAGNOSIS — I739 Peripheral vascular disease, unspecified: Secondary | ICD-10-CM

## 2013-01-28 LAB — CBC WITH DIFFERENTIAL/PLATELET
Basophils Absolute: 0 10*3/uL (ref 0.0–0.1)
Basophils Relative: 1 % (ref 0–1)
Eosinophils Absolute: 0.1 10*3/uL (ref 0.0–0.7)
Eosinophils Relative: 2 % (ref 0–5)
HEMATOCRIT: 40.2 % (ref 36.0–46.0)
HEMOGLOBIN: 13.5 g/dL (ref 12.0–15.0)
LYMPHS ABS: 2.7 10*3/uL (ref 0.7–4.0)
Lymphocytes Relative: 42 % (ref 12–46)
MCH: 30.3 pg (ref 26.0–34.0)
MCHC: 33.6 g/dL (ref 30.0–36.0)
MCV: 90.3 fL (ref 78.0–100.0)
MONO ABS: 0.4 10*3/uL (ref 0.1–1.0)
MONOS PCT: 6 % (ref 3–12)
NEUTROS ABS: 3.2 10*3/uL (ref 1.7–7.7)
NEUTROS PCT: 49 % (ref 43–77)
Platelets: 183 10*3/uL (ref 150–400)
RBC: 4.45 MIL/uL (ref 3.87–5.11)
RDW: 14.8 % (ref 11.5–15.5)
WBC: 6.5 10*3/uL (ref 4.0–10.5)

## 2013-01-28 MED ORDER — ROSUVASTATIN CALCIUM 20 MG PO TABS: 20.0000 mg | ORAL_TABLET | Freq: Every day | ORAL | Status: DC

## 2013-01-28 MED ORDER — LEVOTHYROXINE SODIUM 100 MCG PO TABS: 100.0000 ug | ORAL_TABLET | Freq: Every day | ORAL | Status: DC

## 2013-01-28 NOTE — Patient Instructions (Signed)
1. Please go for colonoscopy as soon as possible, at least in the next few weeks.    2. Increase you levothyroxine to 13mcg daily.  3. Take you Crestor every day.  4. Please take all medications as prescribed.   5. If you have worsening of your symptoms or new symptoms arise, please call the clinic ([phone]), or go to the ER immediately if symptoms are severe.  6. You will be contacted if there are any lab abnormalities.  Please return to clinic in 6 weeks to check your thyroid labs (TSH).

## 2013-01-28 NOTE — Progress Notes (Signed)
   Subjective:    Patient ID: [name], female    DOB: Oct 05, 1950, 63 y.o.   MRN: [id number]  HPI Comments: [name] is a 63 year old woman with a PMH of PAD, HTN, GERD and hyothyroidism who presents for follow-up after recent angioplasty and stenting of the left external iliac artery.  The procedure was 8 days ago and she has some residual pain.  She is concerned because she noticed blood in her stool and urine for the past two days.  The blood is bright red and mixed in with the stools which have been looser but not more frequent than normal.  She denies recent straining, anal pruritis, pain or hemorrhoid history but says she has had an anal fissure before.  She denies recent NSAID use.  She had a BM this morning which did not have blood in it.  She says her urine has been bright red and was pink today.  She denies dysuria, vaginal bleeding or pelvic pain.    [name] cholesterol levels were still elevated at last check.  She says she had stopped taking her Crestor about 2 months ago but says she has started taking it again.  She last took it two nights ago.  She continues to smoke but says she has cut back to 1/3 of a pack per day.     Review of Systems  Constitutional: Negative for fever, chills and unexpected weight change.  Respiratory: Negative for shortness of breath.   Cardiovascular: Negative for chest pain.  Gastrointestinal: Positive for abdominal pain, diarrhea and blood in stool. Negative for constipation.  Genitourinary: Positive for hematuria. Negative for dysuria and pelvic pain.  Neurological: Positive for headaches.       Objective:   Physical Exam  Vitals reviewed. Constitutional: She is oriented to person, place, and time. No distress.  HENT:  Mouth/Throat: No oropharyngeal exudate.  Eyes: Pupils are equal, round, and reactive to light.  Cardiovascular: Normal rate, regular rhythm and normal heart sounds.   Pulmonary/Chest: Effort normal and breath sounds  normal. No respiratory distress. She has no wheezes. She has no rales.  Abdominal: Soft. Bowel sounds are normal. She exhibits no distension. There is no tenderness.  Right femoral artery cannulation site clean and dry.  Genitourinary: Guaiac negative stool.  Small external hemorrhoid noted.  No bleeding evident.  Neurological: She is alert and oriented to person, place, and time.  Skin: Skin is warm. She is not diaphoretic.  Psychiatric: She has a normal mood and affect. Her behavior is normal.          Assessment & Plan:  Please see problem based assessment and plan.

## 2013-01-29 LAB — URINALYSIS
Bilirubin Urine: NEGATIVE
GLUCOSE, UA: NEGATIVE mg/dL
Hgb urine dipstick: NEGATIVE
Ketones, ur: NEGATIVE mg/dL
LEUKOCYTES UA: NEGATIVE
Nitrite: NEGATIVE
PROTEIN: NEGATIVE mg/dL
Specific Gravity, Urine: 1.012 (ref 1.005–1.030)
Urobilinogen, UA: 0.2 mg/dL (ref 0.0–1.0)
pH: 6 (ref 5.0–8.0)

## 2013-01-30 DIAGNOSIS — R310 Gross hematuria: Secondary | ICD-10-CM | POA: Insufficient documentation

## 2013-01-30 DIAGNOSIS — K921 Melena: Secondary | ICD-10-CM | POA: Insufficient documentation

## 2013-01-30 NOTE — Assessment & Plan Note (Signed)
Lipid Panel     Component Value Date/Time   CHOL 341* 12/20/2012 1019   TRIG 513* 12/20/2012 1019   HDL 34* 12/20/2012 1019   CHOLHDL 10.0 12/20/2012 1019   VLDL NOT CALC 12/20/2012 1019   LDLCALC Comment:   Not calculated due to Triglyceride >400. Suggest ordering Direct LDL (Unit Code: 9054157071).   Total Cholesterol/HDL Ratio:CHD Risk                        Coronary Heart Disease Risk Table                                        Men       Women          1/2 Average Risk              3.4        3.3              Average Risk              5.0        4.4           2X Average Risk              9.6        7.1           3X Average Risk             23.4       11.0 Use the calculated Patient Ratio above and the CHD Risk table  to determine the patient's CHD Risk. ATP III Classification (LDL):       < 100        mg/dL         Optimal      100 - 129     mg/dL         Near or Above Optimal      130 - 159     mg/dL         Borderline High      160 - 189     mg/dL         High       > 190        mg/dL         Very High   12/20/2012 1019   Her CVD risk is 19% based on total cholesterol of 320 (her TC is 341 but the calculator limit is 320).  She did not take take Crestor for about 2 months because she feels it makes her itch.  When I explained that the Crestor was to help prevent heart disease similar to PAD in her legs she agreed to take it daily.

## 2013-01-30 NOTE — Assessment & Plan Note (Addendum)
S/p angioplasty and stenting of left external iliac artery.  She has follow-up scheduled in 2 weeks with Vascular Surgeon, Dr. Kellie Simmering to discuss reperfusion options for the right leg.  I have advised her to stop smoking and to take her Crestor daily.

## 2013-01-30 NOTE — Assessment & Plan Note (Addendum)
She reports 2 days with blood in stool which has resolved today.  Hemodynamically stable, FOBT negative and no blood in BM today.  No LLQ tenderness so doubt diverticulosis.  Possibly hemorrhoidal bleeding.  She was referred for colonoscopy but has not yet gone.  I explained the importance of having a screening colonoscopy to rule out malignancy.  She is agreeable.  Plan:  1) CBC           2) Refer for colonoscopy           3) Return to clinic if symptoms return.            4) Go to the ED with severe bleeding or worsening symptoms.

## 2013-01-30 NOTE — Assessment & Plan Note (Addendum)
Bright red blood in urine x 2 days.  Now resolving.  No use of anticoagulants, NSAIDs.  No UTI symptoms.  Recent creatinine at hospital discharge improved to 1.17 (baseline ~ 1.5).  Will obtain UA.  Follow-up in 1 week.

## 2013-01-30 NOTE — Assessment & Plan Note (Signed)
BP Readings from Last 3 Encounters:  01/28/13 124/83  01/21/13 151/87  01/21/13 151/87    Lab Results  Component Value Date   NA 141 01/21/2013   K 4.4 01/21/2013   CREATININE 1.17* 01/21/2013    Assessment: Blood pressure control:  well controlled Progress toward BP goal:   at goal Comments: She reports compliance with antihypertensives.  Plan: Medications:  Continue Maxzide 37.5-25mg  daily, Lisinopril 40mg  daily, amlodipine 10mg  daily. Other plans: Follow-up in 3 months.

## 2013-01-30 NOTE — Assessment & Plan Note (Signed)
Last TSH 12.44.  Patient reports compliance with 94mcg daily before breakfast and separate from other meds.  Will increase to 123mcg, recheck TSH in 6-8 weeks and make additional increase if necessary.

## 2013-02-01 NOTE — Progress Notes (Signed)
Case discussed with Dr. Wilson at the time of the visit.  We reviewed the resident's history and exam and pertinent patient test results.  I agree with the assessment, diagnosis, and plan of care documented in the resident's note. 

## 2013-02-04 ENCOUNTER — Other Ambulatory Visit: Payer: Self-pay | Admitting: *Deleted

## 2013-02-04 MED ORDER — PANTOPRAZOLE SODIUM 40 MG PO TBEC: 40.0000 mg | DELAYED_RELEASE_TABLET | Freq: Every morning | ORAL | Status: DC

## 2013-02-07 ENCOUNTER — Encounter: Payer: Self-pay | Admitting: Vascular Surgery

## 2013-02-08 ENCOUNTER — Encounter: Payer: Self-pay | Admitting: Vascular Surgery

## 2013-02-08 ENCOUNTER — Ambulatory Visit (INDEPENDENT_AMBULATORY_CARE_PROVIDER_SITE_OTHER): Payer: Medicare Other | Admitting: Vascular Surgery

## 2013-02-08 VITALS — BP 114/76 | HR 102 | Resp 18 | Ht 61.5 in | Wt 214.0 lb

## 2013-02-08 DIAGNOSIS — I739 Peripheral vascular disease, unspecified: Secondary | ICD-10-CM | POA: Diagnosis not present

## 2013-02-08 DIAGNOSIS — Z48812 Encounter for surgical aftercare following surgery on the circulatory system: Secondary | ICD-10-CM

## 2013-02-08 NOTE — Addendum Note (Signed)
Addended by: Hulan Fray on: 02/08/2013 07:22 PM   Modules accepted: Orders

## 2013-02-08 NOTE — Addendum Note (Signed)
Addended by: Dorthula Rue L on: 02/08/2013 03:29 PM   Modules accepted: Orders

## 2013-02-08 NOTE — Progress Notes (Signed)
Subjective:     Patient ID: [name], female   DOB: [birth year], 63 y.o.   MRN: [id number]  HPI this 63 year old female has bilateral calf claudication after about one half block of ambulation. She also complains of bilateral thigh and hip pain while standing without ambulation. She was evaluated by me in December and underwent angiography by Dr. Bridgett Larsson in December of 2014. She had angioplasty and stenting of her left external iliac artery. She has a previous stent in her left SFA placed at [facility] and 2012. The stent is patent but there is diffuse narrowing of the left superficial femoral system. Right leg has a patent iliac system with a less than 50% stenosis proximally and diffuse narrowing and near occlusion of the right superficial femoral artery. Both legs have essentially one vessel runoff. She is not experiencing rest pain nonhealing ulcers infection or cellulitis.  Past Medical History  Diagnosis Date  . Hypothyroidism   . Hypertension   . TIA (transient ischemic attack)   . PUD (peptic ulcer disease)   . HLD (hyperlipidemia)   . Depression   . GERD (gastroesophageal reflux disease)     History  Substance Use Topics  . Smoking status: Current Every Day Smoker -- 0.30 packs/day    Types: Cigarettes  . Smokeless tobacco: Never Used     Comment: try to quit  . Alcohol Use: No    Family History  Problem Relation Age of Onset  . Peripheral vascular disease Mother   . Diabetes Mother   . AAA (abdominal aortic aneurysm) Father   . Hypertension Father   . Cancer Sister   . Cancer Brother   . Heart disease Brother     Allergies  Allergen Reactions  . Asa [Aspirin] Nausea And Vomiting  . Penicillins Hives and Itching    Current outpatient prescriptions:amLODipine (NORVASC) 10 MG tablet, Take 10 mg by mouth daily., Disp: , Rfl: ;  Cholecalciferol (VITAMIN D-3) 1000 UNITS CAPS, Take 2,000 Units by mouth daily., Disp: , Rfl: ;  clopidogrel (PLAVIX) 75 MG tablet, Take 75 mg  by mouth daily., Disp: , Rfl: ;  cyclobenzaprine (FLEXERIL) 10 MG tablet, Take 10 mg by mouth daily as needed for muscle spasms., Disp: , Rfl:  doxepin (SINEQUAN) 100 MG capsule, Take 100 mg by mouth at bedtime., Disp: , Rfl: ;  levothyroxine (SYNTHROID, LEVOTHROID) 100 MCG tablet, Take 1 tablet (100 mcg total) by mouth daily before breakfast., Disp: 30 tablet, Rfl: 1;  lisinopril (PRINIVIL,ZESTRIL) 40 MG tablet, Take 40 mg by mouth daily., Disp: , Rfl: ;  pantoprazole (PROTONIX) 40 MG tablet, Take 1 tablet (40 mg total) by mouth every morning., Disp: 30 tablet, Rfl: 3 potassium gluconate 595 MG TABS tablet, Take 595 mg by mouth daily., Disp: , Rfl: ;  rosuvastatin (CRESTOR) 20 MG tablet, Take 1 tablet (20 mg total) by mouth daily., Disp: 30 tablet, Rfl: 2;  sertraline (ZOLOFT) 100 MG tablet, Take 100 mg by mouth daily., Disp: , Rfl: ;  Simethicone (GAS-X PO), Take 1 tablet by mouth every 6 (six) hours as needed (for flatulence)., Disp: , Rfl:  triamterene-hydrochlorothiazide (MAXZIDE-25) 37.5-25 MG per tablet, Take 1 tablet by mouth daily., Disp: , Rfl: ;  vitamin B-12 (CYANOCOBALAMIN) 100 MCG tablet, Take 100 mcg by mouth daily., Disp: , Rfl:   BP 114/76  Pulse 102  Resp 18  Ht 5' 1.5" (1.562 m)  Wt 214 lb (97.07 kg)  BMI 39.79 kg/m2  Body mass index is 39.79  kg/(m^2).           Review of Systems denies chest pain, dyspnea on exertion, PND, orthopnea, chronic back pain. Other systems negative and complete review of systems    Objective:   Physical Exam BP 114/76  Pulse 102  Resp 18  Ht 5' 1.5" (1.562 m)  Wt 214 lb (97.07 kg)  BMI 39.79 kg/m2  Gen.-alert and oriented x3 in no apparent distress HEENT normal for age Lungs no rhonchi or wheezing Cardiovascular regular rhythm no murmurs carotid pulses 3+ palpable no bruits audible Abdomen soft nontender no palpable masses-obese  Musculoskeletal free of  major deformities Skin clear -no rashes Neurologic normal Lower extremities  femoral pulses are difficult to palpate but seemed to be in the 1-2+ range bilaterally. No distal pulses are palpable. Both feet are well-perfused.  Today I reviewed the angiogram performed by Dr. Bridgett Larsson December 2014. Her left leg seems better than right by angiography but does have diffuse superficial femoral disease. Her symptoms are worse in the left leg.        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 His symptoms become much worse may need to consider bilateral femoral-popliteal bypass grafting

## 2013-02-22 ENCOUNTER — Encounter: Payer: Self-pay | Admitting: Vascular Surgery

## 2013-03-04 ENCOUNTER — Ambulatory Visit (HOSPITAL_COMMUNITY): Payer: Self-pay | Admitting: Psychiatry

## 2013-03-09 ENCOUNTER — Ambulatory Visit (HOSPITAL_COMMUNITY): Payer: Self-pay | Admitting: Psychiatry

## 2013-03-30 ENCOUNTER — Encounter: Payer: Self-pay | Admitting: Internal Medicine

## 2013-03-30 ENCOUNTER — Ambulatory Visit (INDEPENDENT_AMBULATORY_CARE_PROVIDER_SITE_OTHER): Payer: Medicare Other | Admitting: Internal Medicine

## 2013-03-30 VITALS — BP 142/84 | HR 94 | Temp 97.8°F | Ht 61.4 in | Wt 222.7 lb

## 2013-03-30 DIAGNOSIS — K219 Gastro-esophageal reflux disease without esophagitis: Secondary | ICD-10-CM

## 2013-03-30 DIAGNOSIS — Z1231 Encounter for screening mammogram for malignant neoplasm of breast: Secondary | ICD-10-CM

## 2013-03-30 DIAGNOSIS — R51 Headache: Secondary | ICD-10-CM | POA: Diagnosis not present

## 2013-03-30 DIAGNOSIS — E785 Hyperlipidemia, unspecified: Secondary | ICD-10-CM

## 2013-03-30 DIAGNOSIS — H269 Unspecified cataract: Secondary | ICD-10-CM | POA: Diagnosis not present

## 2013-03-30 DIAGNOSIS — R7309 Other abnormal glucose: Secondary | ICD-10-CM | POA: Diagnosis not present

## 2013-03-30 DIAGNOSIS — K921 Melena: Secondary | ICD-10-CM | POA: Diagnosis not present

## 2013-03-30 DIAGNOSIS — Z0000 Encounter for general adult medical examination without abnormal findings: Secondary | ICD-10-CM

## 2013-03-30 DIAGNOSIS — I10 Essential (primary) hypertension: Secondary | ICD-10-CM

## 2013-03-30 DIAGNOSIS — E039 Hypothyroidism, unspecified: Secondary | ICD-10-CM

## 2013-03-30 DIAGNOSIS — F411 Generalized anxiety disorder: Secondary | ICD-10-CM | POA: Diagnosis not present

## 2013-03-30 DIAGNOSIS — R310 Gross hematuria: Secondary | ICD-10-CM | POA: Diagnosis not present

## 2013-03-30 DIAGNOSIS — I739 Peripheral vascular disease, unspecified: Secondary | ICD-10-CM | POA: Diagnosis not present

## 2013-03-30 DIAGNOSIS — H5789 Other specified disorders of eye and adnexa: Secondary | ICD-10-CM | POA: Diagnosis not present

## 2013-03-30 DIAGNOSIS — F332 Major depressive disorder, recurrent severe without psychotic features: Secondary | ICD-10-CM | POA: Diagnosis not present

## 2013-03-30 MED ORDER — AMLODIPINE BESYLATE 10 MG PO TABS: 10.0000 mg | ORAL_TABLET | Freq: Every day | ORAL | Status: DC

## 2013-03-30 MED ORDER — LEVOTHYROXINE SODIUM 100 MCG PO TABS: 100.0000 ug | ORAL_TABLET | Freq: Every day | ORAL | Status: DC

## 2013-03-30 MED ORDER — ATORVASTATIN CALCIUM 80 MG PO TABS: 80.0000 mg | ORAL_TABLET | Freq: Every day | ORAL | Status: DC

## 2013-03-30 MED ORDER — TRIAMTERENE-HCTZ 37.5-25 MG PO TABS: 1.0000 | ORAL_TABLET | Freq: Every day | ORAL | Status: DC

## 2013-03-30 MED ORDER — CLOPIDOGREL BISULFATE 75 MG PO TABS: 75.0000 mg | ORAL_TABLET | Freq: Every day | ORAL | Status: DC

## 2013-03-30 MED ORDER — SERTRALINE HCL 100 MG PO TABS: 100.0000 mg | ORAL_TABLET | Freq: Every day | ORAL | Status: DC

## 2013-03-30 MED ORDER — LISINOPRIL 40 MG PO TABS: 40.0000 mg | ORAL_TABLET | Freq: Every day | ORAL | Status: DC

## 2013-03-30 NOTE — Assessment & Plan Note (Addendum)
BP Readings from Last 3 Encounters:  03/30/13 142/84  02/08/13 114/76  01/28/13 124/83    Lab Results  Component Value Date   NA 141 01/21/2013   K 4.4 01/21/2013   CREATININE 1.17* 01/21/2013    Assessment: Blood pressure control:  mildly elevated Progress toward BP goal:   mildly elevated Comments: Patient reports compliance with anti-hypertensives and has been well-controlled at recent appointments.  Will continue current therapy.  Plan: Medications:  continue current medications:  traimteren-HCTZ 37.5-25mg  daily, lisinopril 40mg  daily and amlodipine 10mg  daily Educational resources provided: brochure Other plans: return to clinic in 3 - 6 months

## 2013-03-30 NOTE — Assessment & Plan Note (Addendum)
Patient with return of epigastric burning, heartburn, acid regurgitation and dysphagia despite Protonix 40mg  daily.  Patient reports history of PUD and GERD and says that up until two months ago symptoms had been well controlled for many years on PPI.  She has not had weight loss but given refractory symptoms and symptoms of dysphagia and odynophagia will refer to GI for evaluation and endoscopy.

## 2013-03-30 NOTE — Progress Notes (Signed)
   Subjective:    Patient ID: [name], female    DOB: 06-13-1950, 63 y.o.   MRN: [id number]  HPI Comments: [name] is a 63 year old woman with a PMH of PAD, HTN, GERD and hyothyroidism who presents with c/o of increasing epigastric burning and reflux symptoms x two months.  Patient says she has been taking Protonix x 10 years and symptoms have been well controlled.  In the past two months she has had a return of symptoms including burning epigastric pain that shoots through back.  The pain occurs after eat, especially spicy foods.  She tries to be careful to avoid spicy and acidic foods but she eats a lot of junk food.  Associated symptoms include cough, dysphagia, odynophagia and nausea. She does not use NSAIDS or EtOH.  She has cut back on smoking but is still smoking 6-7 cigarettes daily.  She denies vomiting or unexplained weight loss.         Review of Systems  Constitutional: Negative for fever and chills.  Respiratory: Negative for shortness of breath.   Cardiovascular: Negative for chest pain.  Gastrointestinal: Positive for abdominal pain. Negative for nausea, vomiting and diarrhea.       Epigastric pain  Endocrine: Negative for polyuria.  Genitourinary: Negative for dysuria, frequency and hematuria.       Objective:   Physical Exam  Constitutional: She is oriented to person, place, and time. No distress.  HENT:  Mouth/Throat: Oropharynx is clear and moist.  Eyes: Pupils are equal, round, and reactive to light.  Cardiovascular: Normal rate, regular rhythm and normal heart sounds.   Pulmonary/Chest: Effort normal and breath sounds normal. No respiratory distress. She has no wheezes. She has no rales.  Abdominal: Soft. She exhibits no distension. There is tenderness.  Epigastric TTP  Neurological: She is alert and oriented to person, place, and time.  Skin: Skin is warm. She is not diaphoretic.  Psychiatric: She has a normal mood and affect. Her behavior is normal.            Assessment & Plan:  Please see problem based assessment and plan.

## 2013-03-30 NOTE — Assessment & Plan Note (Signed)
Her insurance company is no longer covering Crestor and she cannot afford the cost.  She would like a more affordable option. - switch from Crestor 20mg  to equivalent atorvastatin 80mg   - patient was advised to stop drug and call clinic if she experienced any new symptoms with the new medication.

## 2013-03-30 NOTE — Assessment & Plan Note (Signed)
Patient says she will schedule PAP smear here in cling.  She is agreeable to referral for colonoscopy and mammogram.  I will place referrals today.

## 2013-03-30 NOTE — Patient Instructions (Addendum)
1. We will call you with your appointment date for Gastroenterologist (for your GERD symptoms and for colonoscopy screening).  We will also call you with mammogram appointment.   2. Please take all medications as prescribed.   3. If you have worsening of your symptoms or new symptoms arise, please call the clinic ([phone]), or go to the ER immediately if symptoms are severe.  Smoking Cessation Quitting smoking is important to your health and has many advantages. However, it is not always easy to quit since nicotine is a very addictive drug. Often times, people try 3 times or more before being able to quit. This document explains the best ways for you to prepare to quit smoking. Quitting takes hard work and a lot of effort, but you can do it. ADVANTAGES OF QUITTING SMOKING  You will live longer, feel better, and live better.  Your body will feel the impact of quitting smoking almost immediately.  Within 20 minutes, blood pressure decreases. Your pulse returns to its normal level.  After 8 hours, carbon monoxide levels in the blood return to normal. Your oxygen level increases.  After 24 hours, the chance of having a heart attack starts to decrease. Your breath, hair, and body stop smelling like smoke.  After 48 hours, damaged nerve endings begin to recover. Your sense of taste and smell improve.  After 72 hours, the body is virtually free of nicotine. Your bronchial tubes relax and breathing becomes easier.  After 2 to 12 weeks, lungs can hold more air. Exercise becomes easier and circulation improves.  The risk of having a heart attack, stroke, cancer, or lung disease is greatly reduced.  After 1 year, the risk of coronary heart disease is cut in half.  After 5 years, the risk of stroke falls to the same as a nonsmoker.  After 10 years, the risk of lung cancer is cut in half and the risk of other cancers decreases significantly.  After 15 years, the risk of coronary heart disease  drops, usually to the level of a nonsmoker.  If you are pregnant, quitting smoking will improve your chances of having a healthy baby.  The people you live with, especially any children, will be healthier.  You will have extra money to spend on things other than cigarettes. QUESTIONS TO THINK ABOUT BEFORE ATTEMPTING TO QUIT You may want to talk about your answers with your caregiver.  Why do you want to quit?  If you tried to quit in the past, what helped and what did not?  What will be the most difficult situations for you after you quit? How will you plan to handle them?  Who can help you through the tough times? Your family? Friends? A caregiver?  What pleasures do you get from smoking? What ways can you still get pleasure if you quit? Here are some questions to ask your caregiver:  How can you help me to be successful at quitting?  What medicine do you think would be best for me and how should I take it?  What should I do if I need more help?  What is smoking withdrawal like? How can I get information on withdrawal? GET READY  Set a quit date.  Change your environment by getting rid of all cigarettes, ashtrays, matches, and lighters in your home, car, or work. Do not let people smoke in your home.  Review your past attempts to quit. Think about what worked and what did not. GET SUPPORT AND ENCOURAGEMENT  You have a better chance of being successful if you have help. You can get support in many ways.  Tell your family, friends, and co-workers that you are going to quit and need their support. Ask them not to smoke around you.  Get individual, group, or telephone counseling and support. Programs are available at [employer] and health centers. Call your local health department for information about programs in your area.  Spiritual beliefs and practices may help some smokers quit.  Download a "quit meter" on your computer to keep track of quit statistics, such as how  long you have gone without smoking, cigarettes not smoked, and money saved.  Get a self-help book about quitting smoking and staying off of tobacco. [place] yourself from urges to smoke. Talk to someone, go for a walk, or occupy your time with a task.  Change your normal routine. Take a different route to work. Drink tea instead of coffee. Eat breakfast in a different place.  Reduce your stress. Take a hot bath, exercise, or read a book.  Plan something enjoyable to do every day. Reward yourself for not smoking.  Explore interactive web-based programs that specialize in helping you quit. GET MEDICINE AND USE IT CORRECTLY Medicines can help you stop smoking and decrease the urge to smoke. Combining medicine with the above behavioral methods and support can greatly increase your chances of successfully quitting smoking.  Nicotine replacement therapy helps deliver nicotine to your body without the negative effects and risks of smoking. Nicotine replacement therapy includes nicotine gum, lozenges, inhalers, nasal sprays, and skin patches. Some may be available over-the-counter and others require a prescription.  Antidepressant medicine helps people abstain from smoking, but how this works is unknown. This medicine is available by prescription.  Nicotinic receptor partial agonist medicine simulates the effect of nicotine in your brain. This medicine is available by prescription. Ask your caregiver for advice about which medicines to use and how to use them based on your health history. Your caregiver will tell you what side effects to look out for if you choose to be on a medicine or therapy. Carefully read the information on the package. Do not use any other product containing nicotine while using a nicotine replacement product.  RELAPSE OR DIFFICULT SITUATIONS Most relapses occur within the first 3 months after quitting. Do not be discouraged if you start smoking  again. Remember, most people try several times before finally quitting. You may have symptoms of withdrawal because your body is used to nicotine. You may crave cigarettes, be irritable, feel very hungry, cough often, get headaches, or have difficulty concentrating. The withdrawal symptoms are only temporary. They are strongest when you first quit, but they will go away within 10 14 days. To reduce the chances of relapse, try to:  Avoid drinking alcohol. Drinking lowers your chances of successfully quitting.  Reduce the amount of caffeine you consume. Once you quit smoking, the amount of caffeine in your body increases and can give you symptoms, such as a rapid heartbeat, sweating, and anxiety.  Avoid smokers because they can make you want to smoke.  Do not let weight gain distract you. Many smokers will gain weight when they quit, usually less than 10 pounds. Eat a healthy diet and stay active. You can always lose the weight gained after you quit.  Find ways to improve your mood other than smoking. FOR MORE INFORMATION  www.smokefree.gov  Document Released: 12/24/2000 Document Revised: 07/01/2011  Document Reviewed: 04/10/2011 [facility] Patient Information 2014 [place].

## 2013-03-30 NOTE — Assessment & Plan Note (Signed)
She says [facility] never gave her new rx.  She has a current bottle with the 88mcg with her.  I have sent the rx for 164mcg again.  Will need TSH check in 6-8 weeks.

## 2013-03-31 NOTE — Progress Notes (Signed)
Case discussed with Dr. Wilson at the time of the visit.  We reviewed the resident's history and exam and pertinent patient test results.  I agree with the assessment, diagnosis, and plan of care documented in the resident's note. 

## 2013-04-01 ENCOUNTER — Other Ambulatory Visit: Payer: Self-pay | Admitting: Internal Medicine

## 2013-04-01 DIAGNOSIS — Z1231 Encounter for screening mammogram for malignant neoplasm of breast: Secondary | ICD-10-CM

## 2013-04-04 ENCOUNTER — Other Ambulatory Visit: Payer: Self-pay | Admitting: *Deleted

## 2013-04-04 NOTE — Telephone Encounter (Signed)
Also pt wants to know if it's ok to take Atorvastatin; states on the bottle "Atorvastatin Calcium". States she has too much calcium in her body. Please [name]  Thanks

## 2013-04-05 ENCOUNTER — Telehealth: Payer: Self-pay | Admitting: *Deleted

## 2013-04-05 MED ORDER — DOXEPIN HCL 100 MG PO CAPS: 100.0000 mg | ORAL_CAPSULE | Freq: Every day | ORAL | Status: DC

## 2013-04-05 NOTE — Telephone Encounter (Signed)
Pt informed her last Calcium levels were normal and okay to take Atorvastatin per Dr Redmond Pulling.Pt voiced understanding.

## 2013-04-05 NOTE — Telephone Encounter (Signed)
Message copied by Ebbie Latus on Tue Apr 05, 2013  2:12 PM ------      Message from: Francesca Oman      Created: Tue Apr 05, 2013  1:14 PM       Hi Earlene Plater, Ms. Duggin needs to take her atorvastatin.  Also, her most recent calcium levels have been normal.            Thank you,            Duwaine Maxin DO ------

## 2013-04-06 ENCOUNTER — Other Ambulatory Visit: Payer: Self-pay | Admitting: Internal Medicine

## 2013-04-06 DIAGNOSIS — Z1211 Encounter for screening for malignant neoplasm of colon: Secondary | ICD-10-CM

## 2013-04-08 ENCOUNTER — Encounter: Payer: Self-pay | Admitting: Internal Medicine

## 2013-04-11 ENCOUNTER — Encounter: Payer: Self-pay | Admitting: Vascular Surgery

## 2013-04-12 ENCOUNTER — Ambulatory Visit (INDEPENDENT_AMBULATORY_CARE_PROVIDER_SITE_OTHER)
Admission: RE | Admit: 2013-04-12 | Discharge: 2013-04-12 | Disposition: A | Discharge status: Home / Self Care | Payer: Medicare Other | Source: Ambulatory Visit | Attending: Vascular Surgery | Admitting: Vascular Surgery

## 2013-04-12 ENCOUNTER — Ambulatory Visit (INDEPENDENT_AMBULATORY_CARE_PROVIDER_SITE_OTHER): Payer: Medicare Other | Admitting: Vascular Surgery

## 2013-04-12 ENCOUNTER — Ambulatory Visit (HOSPITAL_COMMUNITY)
Admission: RE | Admit: 2013-04-12 | Discharge: 2013-04-12 | Disposition: A | Discharge status: Home / Self Care | Payer: Medicare Other | Source: Ambulatory Visit | Attending: Vascular Surgery | Admitting: Vascular Surgery

## 2013-04-12 ENCOUNTER — Encounter: Payer: Self-pay | Admitting: Vascular Surgery

## 2013-04-12 VITALS — BP 152/88 | HR 85 | Ht 61.0 in | Wt 214.0 lb

## 2013-04-12 DIAGNOSIS — I739 Peripheral vascular disease, unspecified: Secondary | ICD-10-CM | POA: Insufficient documentation

## 2013-04-12 NOTE — Addendum Note (Signed)
Addended by: Yvonna Alanis E on: 04/12/2013 11:18 AM   Modules accepted: Orders

## 2013-04-12 NOTE — Progress Notes (Signed)
Subjective:     Patient ID: [name], female   DOB: 27-Nov-1950, 63 y.o.   MRN: [id number]  HPI this 63 year old female returns for further complaints regarding bilateral lower extremity pain with minimal ambulation. Patient has had a previous stent in her left SFA at [facility] and 2012. Dr. Tacy Learn inserted left iliac stent several months ago. She has some narrowing within the left superficial femoral stent demonstrated on angiogram. She states that she walk short distances and then begins having pain in her buttocks thighs and calves. She also has this while sitting on occasion. She has a history infection gangrene and nonhealing ulcers. She has no history of back problems.  Past Medical History  Diagnosis Date  . Hypothyroidism   . Hypertension   . TIA (transient ischemic attack)   . PUD (peptic ulcer disease)   . HLD (hyperlipidemia)   . Depression   . GERD (gastroesophageal reflux disease)     History  Substance Use Topics  . Smoking status: Current Every Day Smoker -- 0.40 packs/day    Types: Cigarettes  . Smokeless tobacco: Never Used     Comment: try to quit.  Smokes 6-7 per day.  Cutting back  . Alcohol Use: No    Family History  Problem Relation Age of Onset  . Peripheral vascular disease Mother   . Diabetes Mother   . AAA (abdominal aortic aneurysm) Father   . Hypertension Father   . Cancer Sister   . Cancer Brother   . Heart disease Brother     Allergies  Allergen Reactions  . Asa [Aspirin] Nausea And Vomiting  . Penicillins Hives and Itching    Current outpatient prescriptions:amLODipine (NORVASC) 10 MG tablet, Take 1 tablet (10 mg total) by mouth daily., Disp: 30 tablet, Rfl: 3;  atorvastatin (LIPITOR) 80 MG tablet, Take 1 tablet (80 mg total) by mouth daily., Disp: 30 tablet, Rfl: 11;  Cholecalciferol (VITAMIN D-3) 1000 UNITS CAPS, Take 2,000 Units by mouth daily., Disp: , Rfl: ;  clopidogrel (PLAVIX) 75 MG tablet, Take 1 tablet (75 mg total) by mouth  daily., Disp: 30 tablet, Rfl: 3 cyclobenzaprine (FLEXERIL) 10 MG tablet, Take 10 mg by mouth daily as needed for muscle spasms., Disp: , Rfl: ;  doxepin (SINEQUAN) 100 MG capsule, Take 1 capsule (100 mg total) by mouth at bedtime., Disp: 30 capsule, Rfl: 1;  levothyroxine (SYNTHROID, LEVOTHROID) 100 MCG tablet, Take 1 tablet (100 mcg total) by mouth daily before breakfast., Disp: 30 tablet, Rfl: 1 lisinopril (PRINIVIL,ZESTRIL) 40 MG tablet, Take 1 tablet (40 mg total) by mouth daily., Disp: 30 tablet, Rfl: 3;  pantoprazole (PROTONIX) 40 MG tablet, Take 1 tablet (40 mg total) by mouth every morning., Disp: 30 tablet, Rfl: 3;  potassium gluconate 595 MG TABS tablet, Take 595 mg by mouth daily., Disp: , Rfl: ;  sertraline (ZOLOFT) 100 MG tablet, Take 1 tablet (100 mg total) by mouth daily., Disp: 30 tablet, Rfl: 3 Simethicone (GAS-X PO), Take 1 tablet by mouth every 6 (six) hours as needed (for flatulence)., Disp: , Rfl: ;  triamterene-hydrochlorothiazide (MAXZIDE-25) 37.5-25 MG per tablet, Take 1 tablet by mouth daily., Disp: 30 tablet, Rfl: 3;  vitamin B-12 (CYANOCOBALAMIN) 100 MCG tablet, Take 100 mcg by mouth daily., Disp: , Rfl:   BP 152/88  Pulse 85  Ht 5\' 1"  (1.549 m)  Wt 214 lb (97.07 kg)  BMI 40.46 kg/m2  SpO2 98%  Body mass index is 40.46 kg/(m^2).  Review of Systems denies chest pain, dyspnea on exertion, PND, orthopnea, hemoptysis. Patient does have varicose veins, and panic attacks because she does have claustrophobia.    Objective:   Physical Exam BP 152/88  Pulse 85  Ht 5\' 1"  (1.549 m)  Wt 214 lb (97.07 kg)  BMI 40.46 kg/m2  SpO2 98%  General well-developed well-nourished female no apparent stress alert and oriented x3 Lungs no rhonchi or wheezing Cardiovascular regular and no murmurs Abdomen obese no masses Femoral pulses 3+ bilaterally with well perfused lower extremities.  Today I ordered duplex scan of both lower extremities and ABIs. Left leg has  triphasic flow in the foot with an ABI of 0.9 to a widely patent left SFA. Right leg has ABI of 0.75 with monophasic flow in it fairly well widely patent SFA and popliteal system on the right.       Assessment:     Bilateral buttock, hip, thigh, and calf pain with minimal angulation-not due to vascular etiology Consider spinal stenosis or some type of nerve compression syndrome    Plan:     No further vascular evaluation is indicated in this patient at this time. If she continues to have severe symptoms medical doctor might consider MRI of the spine or referral to a Back  specialist

## 2013-04-26 ENCOUNTER — Ambulatory Visit (HOSPITAL_COMMUNITY): Payer: Medicare Other

## 2013-05-13 ENCOUNTER — Other Ambulatory Visit: Payer: Self-pay | Admitting: Internal Medicine

## 2013-05-17 DIAGNOSIS — H40019 Open angle with borderline findings, low risk, unspecified eye: Secondary | ICD-10-CM | POA: Diagnosis not present

## 2013-05-17 DIAGNOSIS — H2510 Age-related nuclear cataract, unspecified eye: Secondary | ICD-10-CM | POA: Diagnosis not present

## 2013-05-17 DIAGNOSIS — H0230 Blepharochalasis unspecified eye, unspecified eyelid: Secondary | ICD-10-CM | POA: Diagnosis not present

## 2013-05-17 DIAGNOSIS — Z961 Presence of intraocular lens: Secondary | ICD-10-CM | POA: Diagnosis not present

## 2013-05-27 ENCOUNTER — Encounter: Payer: Self-pay | Admitting: Internal Medicine

## 2013-05-27 MED ORDER — CHOLESTYRAMINE 4 G PO PACK: 4.0000 g | PACK | Freq: Two times a day (BID) | ORAL | Status: DC

## 2013-05-30 NOTE — Progress Notes (Signed)
This encounter was created in error - please disregard.

## 2013-05-30 NOTE — Addendum Note (Signed)
Addended by: Hulan Fray on: 05/30/2013 08:34 PM   Modules accepted: Orders

## 2013-06-02 ENCOUNTER — Other Ambulatory Visit: Payer: Self-pay | Admitting: Internal Medicine

## 2013-06-30 ENCOUNTER — Ambulatory Visit: Payer: Self-pay | Admitting: Internal Medicine

## 2013-07-20 ENCOUNTER — Ambulatory Visit (INDEPENDENT_AMBULATORY_CARE_PROVIDER_SITE_OTHER): Payer: Medicare Other | Admitting: Internal Medicine

## 2013-07-20 ENCOUNTER — Encounter: Payer: Self-pay | Admitting: Internal Medicine

## 2013-07-20 VITALS — BP 136/84 | HR 88 | Temp 97.3°F | Ht 61.4 in | Wt 219.7 lb

## 2013-07-20 DIAGNOSIS — Z0000 Encounter for general adult medical examination without abnormal findings: Secondary | ICD-10-CM | POA: Diagnosis not present

## 2013-07-20 DIAGNOSIS — E785 Hyperlipidemia, unspecified: Secondary | ICD-10-CM | POA: Diagnosis not present

## 2013-07-20 DIAGNOSIS — F411 Generalized anxiety disorder: Secondary | ICD-10-CM | POA: Diagnosis not present

## 2013-07-20 DIAGNOSIS — F332 Major depressive disorder, recurrent severe without psychotic features: Secondary | ICD-10-CM | POA: Diagnosis not present

## 2013-07-20 DIAGNOSIS — I739 Peripheral vascular disease, unspecified: Secondary | ICD-10-CM | POA: Diagnosis not present

## 2013-07-20 DIAGNOSIS — R7309 Other abnormal glucose: Secondary | ICD-10-CM | POA: Diagnosis not present

## 2013-07-20 DIAGNOSIS — H269 Unspecified cataract: Secondary | ICD-10-CM | POA: Diagnosis not present

## 2013-07-20 DIAGNOSIS — R51 Headache: Secondary | ICD-10-CM | POA: Diagnosis not present

## 2013-07-20 DIAGNOSIS — I10 Essential (primary) hypertension: Secondary | ICD-10-CM | POA: Diagnosis not present

## 2013-07-20 DIAGNOSIS — K921 Melena: Secondary | ICD-10-CM | POA: Diagnosis not present

## 2013-07-20 DIAGNOSIS — E039 Hypothyroidism, unspecified: Secondary | ICD-10-CM | POA: Diagnosis not present

## 2013-07-20 DIAGNOSIS — H5789 Other specified disorders of eye and adnexa: Secondary | ICD-10-CM | POA: Diagnosis not present

## 2013-07-20 DIAGNOSIS — E038 Other specified hypothyroidism: Secondary | ICD-10-CM | POA: Diagnosis not present

## 2013-07-20 DIAGNOSIS — K219 Gastro-esophageal reflux disease without esophagitis: Secondary | ICD-10-CM | POA: Diagnosis not present

## 2013-07-20 DIAGNOSIS — Z1231 Encounter for screening mammogram for malignant neoplasm of breast: Secondary | ICD-10-CM

## 2013-07-20 DIAGNOSIS — R310 Gross hematuria: Secondary | ICD-10-CM | POA: Diagnosis not present

## 2013-07-20 MED ORDER — SERTRALINE HCL 100 MG PO TABS: 100.0000 mg | ORAL_TABLET | Freq: Every day | ORAL | Status: DC

## 2013-07-20 MED ORDER — LISINOPRIL 40 MG PO TABS: 40.0000 mg | ORAL_TABLET | Freq: Every day | ORAL | Status: DC

## 2013-07-20 MED ORDER — TRIAMTERENE-HCTZ 37.5-25 MG PO TABS: 1.0000 | ORAL_TABLET | Freq: Every day | ORAL | Status: DC

## 2013-07-20 MED ORDER — CLOPIDOGREL BISULFATE 75 MG PO TABS: 75.0000 mg | ORAL_TABLET | Freq: Every day | ORAL | Status: DC

## 2013-07-20 MED ORDER — DOXEPIN HCL 100 MG PO CAPS: 100.0000 mg | ORAL_CAPSULE | Freq: Every day | ORAL | Status: DC

## 2013-07-20 MED ORDER — LEVOTHYROXINE SODIUM 100 MCG PO TABS: 100.0000 ug | ORAL_TABLET | Freq: Every day | ORAL | Status: DC

## 2013-07-20 MED ORDER — ROSUVASTATIN CALCIUM 20 MG PO TABS: 20.0000 mg | ORAL_TABLET | Freq: Every day | ORAL | Status: DC

## 2013-07-20 MED ORDER — AMLODIPINE BESYLATE 10 MG PO TABS: 10.0000 mg | ORAL_TABLET | Freq: Every day | ORAL | Status: DC

## 2013-07-20 MED ORDER — PANTOPRAZOLE SODIUM 40 MG PO TBEC: 40.0000 mg | DELAYED_RELEASE_TABLET | Freq: Every morning | ORAL | Status: DC

## 2013-07-20 NOTE — Assessment & Plan Note (Addendum)
Increased synthroid to 158mcg in 03/2013.  She reports compliance but says she takes it with all her other medications in the morning. - check TSH today - advised her to take Synthroid first thing in the morning 30 minutes-1 hour before other medications and before eating

## 2013-07-20 NOTE — Assessment & Plan Note (Addendum)
She continues to have leg pain with ambulation.  Recent ABIs in 03/2013 and visit with Dr. Kellie Simmering reassuring.  He did not feel her symptoms were vascular.  She has occasional back pain but unrelated to leg pain, negative straight leg raise, pain is c/w intermittent claudication, so this does not appear to be radicular pain.   - she say she has follow-up with vascular this month - continue ASA and Plavix - encouraged smoking cessation

## 2013-07-20 NOTE — Progress Notes (Signed)
   Subjective:    Patient ID: [name], female    DOB: 05/08/1950, 63 y.o.   MRN: [id number]  HPI Comments: [name] is a 63 year old woman with a PMH of PAD, HTN, GERD and hypothyroidism. She had stenting of the left external iliac artery 6 months ago.  She continues to have pain with ambulation short distances (i.e. Clinic waiting room to exam room).  Also with pain in legs at night.  Describes pain as burning pain like needles.  She reports headache, occasional back pain, muscle ache and feels this is 2/2 to atorvastatin started 4 months ago.  She has started taking half of the pill daily instead of the whole pill.  Wants to go back on Crestor.  Still smoking, but down to 1/3 PPD.       Review of Systems  Constitutional: Negative for fever, chills and appetite change.  Respiratory: Negative for cough and shortness of breath.   Cardiovascular: Positive for leg swelling. Negative for chest pain and palpitations.  Gastrointestinal: Positive for nausea. Negative for vomiting, diarrhea, constipation and blood in stool.  Genitourinary: Negative for dysuria, frequency and hematuria.  Psychiatric/Behavioral: Negative for suicidal ideas.       Objective:   Physical Exam  Vitals reviewed. Constitutional: She is oriented to person, place, and time. She appears well-developed. No distress.  HENT:  Mouth/Throat: Oropharynx is clear and moist. No oropharyngeal exudate.  Eyes: EOM are normal. Pupils are equal, round, and reactive to light.  Cardiovascular: Normal rate and regular rhythm.  Exam reveals no gallop and no friction rub.   No murmur heard. Pulmonary/Chest: Effort normal and breath sounds normal. No respiratory distress. She has no wheezes. She has no rales.  Abdominal: Soft. Bowel sounds are normal. She exhibits no distension. There is no tenderness.  Musculoskeletal: Normal range of motion. She exhibits no edema and no tenderness.  Negative straight leg raise    Neurological: She is alert and oriented to person, place, and time. No cranial nerve deficit.  Skin: Skin is warm. She is not diaphoretic.  Psychiatric: She has a normal mood and affect. Her behavior is normal.          Assessment & Plan:  Please see problem based assessment and plan.

## 2013-07-20 NOTE — Patient Instructions (Addendum)
1. Please follow-up with Dr. Kellie Simmering.  I will call you if there are any lab abnormalities.     2. Please take all medications as prescribed.     3. If you have worsening of your symptoms or new symptoms arise, please call the clinic ([phone]), or go to the ER immediately if symptoms are severe.  We will call you with your appointment time for colonoscopy and mammography.  Return to clinic in 3 months.

## 2013-07-20 NOTE — Assessment & Plan Note (Addendum)
She feels she is having headache, occasional back pain and muscle ache since she started Lipitor.  She has been taking half of the pill daily instead of a whole pill.  She would like to switch back to Crestor and thinks Medicare will cover or she will come up with the money. - switch Lipitor 80mg  --> Crestor 20mg  daily - check CK today

## 2013-07-21 LAB — BASIC METABOLIC PANEL WITH GFR
BUN: 15 mg/dL (ref 6–23)
CHLORIDE: 100 meq/L (ref 96–112)
CO2: 27 mEq/L (ref 19–32)
CREATININE: 1.34 mg/dL — AB (ref 0.50–1.10)
Calcium: 9.9 mg/dL (ref 8.4–10.5)
GFR, EST NON AFRICAN AMERICAN: 42 mL/min — AB
GFR, Est African American: 48 mL/min — ABNORMAL LOW
Glucose, Bld: 87 mg/dL (ref 70–99)
Potassium: 4.5 mEq/L (ref 3.5–5.3)
SODIUM: 139 meq/L (ref 135–145)

## 2013-07-21 LAB — TSH: TSH: 0.819 u[IU]/mL (ref 0.350–4.500)

## 2013-07-21 LAB — CK: Total CK: 123 U/L (ref 7–177)

## 2013-07-21 NOTE — Progress Notes (Signed)
INTERNAL MEDICINE TEACHING ATTENDING ADDENDUM - Aldine Contes, MD: I reviewed and discussed at the time of visit with the resident Dr. Redmond Pulling, the patient's medical history, physical examination, diagnosis and results of tests and treatment and I agree with the patient's care as documented.

## 2013-07-26 ENCOUNTER — Encounter: Payer: Self-pay | Admitting: *Deleted

## 2013-08-03 ENCOUNTER — Encounter: Payer: Self-pay | Admitting: Internal Medicine

## 2013-08-05 ENCOUNTER — Ambulatory Visit: Payer: Self-pay

## 2013-08-09 ENCOUNTER — Ambulatory Visit: Payer: Self-pay | Admitting: Vascular Surgery

## 2013-08-09 ENCOUNTER — Encounter (HOSPITAL_COMMUNITY): Payer: Self-pay

## 2013-08-09 ENCOUNTER — Other Ambulatory Visit (HOSPITAL_COMMUNITY): Payer: Self-pay

## 2013-08-15 ENCOUNTER — Other Ambulatory Visit: Payer: Self-pay | Admitting: Internal Medicine

## 2013-10-14 ENCOUNTER — Ambulatory Visit: Payer: Self-pay | Admitting: Internal Medicine

## 2013-11-28 ENCOUNTER — Ambulatory Visit: Payer: Self-pay | Admitting: Internal Medicine

## 2013-12-01 ENCOUNTER — Ambulatory Visit (INDEPENDENT_AMBULATORY_CARE_PROVIDER_SITE_OTHER): Payer: Medicare HMO | Admitting: Internal Medicine

## 2013-12-01 ENCOUNTER — Encounter: Payer: Self-pay | Admitting: Internal Medicine

## 2013-12-01 VITALS — BP 174/118 | HR 80 | Temp 97.8°F | Ht 61.25 in | Wt 222.2 lb

## 2013-12-01 DIAGNOSIS — K219 Gastro-esophageal reflux disease without esophagitis: Secondary | ICD-10-CM

## 2013-12-01 DIAGNOSIS — I10 Essential (primary) hypertension: Secondary | ICD-10-CM

## 2013-12-01 DIAGNOSIS — R51 Headache: Secondary | ICD-10-CM

## 2013-12-01 DIAGNOSIS — Z0000 Encounter for general adult medical examination without abnormal findings: Secondary | ICD-10-CM

## 2013-12-01 DIAGNOSIS — R519 Headache, unspecified: Secondary | ICD-10-CM

## 2013-12-01 DIAGNOSIS — F332 Major depressive disorder, recurrent severe without psychotic features: Secondary | ICD-10-CM

## 2013-12-01 DIAGNOSIS — I739 Peripheral vascular disease, unspecified: Secondary | ICD-10-CM

## 2013-12-01 DIAGNOSIS — E039 Hypothyroidism, unspecified: Secondary | ICD-10-CM

## 2013-12-01 MED ORDER — CYCLOBENZAPRINE HCL 10 MG PO TABS: 10.0000 mg | ORAL_TABLET | Freq: Every evening | ORAL | Status: DC | PRN

## 2013-12-01 NOTE — Patient Instructions (Signed)
1. Please try and obtain your medications as soon as possible.  I will talk with our social worker to see if there are any other resources to help get your meds.  We will refer you back to Dr. Casimiro Needle.   2. Please take all medications as prescribed.     3. If you have worsening of your symptoms or new symptoms arise, please call the clinic ([phone]), or go to the ER immediately if symptoms are severe.  Tension Headache A tension headache is a feeling of pain, pressure, or aching often felt over the front and sides of the head. The pain can be dull or can feel tight (constricting). It is the most common type of headache. Tension headaches are not normally associated with nausea or vomiting and do not get worse with physical activity. Tension headaches can last 30 minutes to several days.  CAUSES  The exact cause is not known, but it may be caused by chemicals and hormones in the brain that lead to pain. Tension headaches often begin after stress, anxiety, or depression. Other triggers may include:  Alcohol.  Caffeine (too much or withdrawal).  Respiratory infections (colds, flu, sinus infections).  Dental problems or teeth clenching.  Fatigue.  Holding your head and neck in one position too long while using a computer. SYMPTOMS   Pressure around the head.   Dull, aching head pain.   Pain felt over the front and sides of the head.   Tenderness in the muscles of the head, neck, and shoulders. DIAGNOSIS  A tension headache is often diagnosed based on:   Symptoms.   Physical examination.   A CT scan or MRI of your head. These tests may be ordered if symptoms are severe or unusual. TREATMENT  Medicines may be given to help relieve symptoms.  HOME CARE INSTRUCTIONS   Only take over-the-counter or prescription medicines for pain or discomfort as directed by your caregiver.   Lie down in a dark, quiet room when you have a headache.   Keep a journal to find out what  may be triggering your headaches. For example, write down:  What you eat and drink.  How much sleep you get.  Any change to your diet or medicines.  Try massage or other relaxation techniques.   Ice packs or heat applied to the head and neck can be used. Use these 3 to 4 times per day for 15 to 20 minutes each time, or as needed.   Limit stress.   Sit up straight, and do not tense your muscles.   Quit smoking if you smoke.  Limit alcohol use.  Decrease the amount of caffeine you drink, or stop drinking caffeine.  Eat and exercise regularly.  Get 7 to 9 hours of sleep, or as recommended by your caregiver.  Avoid excessive use of pain medicine as recurrent headaches can occur.  SEEK MEDICAL CARE IF:   You have problems with the medicines you were prescribed.  Your medicines do not work.  You have a change from the usual headache.  You have nausea or vomiting. SEEK IMMEDIATE MEDICAL CARE IF:   Your headache becomes severe.  You have a fever.  You have a stiff neck.  You have loss of vision.  You have muscular weakness or loss of muscle control.  You lose your balance or have trouble walking.  You feel faint or pass out.  You have severe symptoms that are different from your first symptoms. MAKE SURE YOU:  Understand these instructions.  Will watch your condition.  Will get help right away if you are not doing well or get worse. Document Released: 12/30/2004 Document Revised: 03/24/2011 Document Reviewed: 12/20/2010 [facility] Patient Information 2015 [place], [place]. This information is not intended to replace advice given to you by your health care provider. Make sure you discuss any questions you have with your health care provider.

## 2013-12-01 NOTE — Progress Notes (Signed)
Blood pressure medications stopped, needs to be back on them. Social work input.  Case discussed with Dr. Redmond Pulling soon after the resident saw the patient.  We reviewed the resident's history and exam and pertinent patient test results.  I agree with the assessment, diagnosis, and plan of care documented in the resident's note.

## 2013-12-01 NOTE — Progress Notes (Signed)
   Subjective:    Patient ID: [name], female    DOB: 1950-09-09, 63 y.o.   MRN: [id number]  HPI Comments: Ms. Schonberger is a 63 year old woman with a PMH of PAD, HTN, GERD and hypothyroidism here for follow-up of chronic conditions.  Please see problem oriented charting for update of her chronic conditions.  Says leg pain is better.      Review of Systems  Constitutional: Positive for appetite change. Negative for fever and chills.       Decreased appetite.  Eyes: Negative for visual disturbance.  Respiratory: Negative for shortness of breath.   Cardiovascular: Negative for chest pain.  Gastrointestinal: Positive for nausea. Negative for vomiting, diarrhea and constipation.  Genitourinary: Negative for dysuria.  Neurological: Positive for headaches. Negative for syncope and weakness.       Objective:   Physical Exam  Constitutional: She is oriented to person, place, and time. She appears well-developed. No distress.  HENT:  Head: Normocephalic and atraumatic.  Mouth/Throat: Oropharynx is clear and moist. No oropharyngeal exudate.  Sinuses non-tender.  TTP around "hatband" and cervical paraspinal musculature.  Eyes: EOM are normal. Pupils are equal, round, and reactive to light.  Neck: Normal range of motion. Neck supple.  Cardiovascular: Normal rate, regular rhythm and normal heart sounds.  Exam reveals no gallop and no friction rub.   No murmur heard. Pulmonary/Chest: Effort normal. No respiratory distress. She has no wheezes. She has no rales.  Abdominal: Soft. Bowel sounds are normal. She exhibits no distension. There is no tenderness.  Musculoskeletal: Normal range of motion. She exhibits no edema or tenderness.  5/5 MMS upper and lower extremity  Neurological: She is alert and oriented to person, place, and time. She displays normal reflexes. No cranial nerve deficit. She exhibits normal muscle tone.  Gait is normal.  Skin: Skin is warm. She is not diaphoretic.    Psychiatric: Her behavior is normal.  restricted affect  Vitals reviewed.         Assessment & Plan:  Please see problem based assessment and plan.

## 2013-12-04 MED ORDER — AMLODIPINE BESYLATE 10 MG PO TABS: 10.0000 mg | ORAL_TABLET | Freq: Every day | ORAL | Status: DC

## 2013-12-04 MED ORDER — LEVOTHYROXINE SODIUM 100 MCG PO TABS: 100.0000 ug | ORAL_TABLET | Freq: Every day | ORAL | Status: DC

## 2013-12-04 MED ORDER — TRIAMTERENE-HCTZ 37.5-25 MG PO TABS: 1.0000 | ORAL_TABLET | Freq: Every day | ORAL | Status: DC

## 2013-12-04 MED ORDER — PANTOPRAZOLE SODIUM 40 MG PO TBEC: 40.0000 mg | DELAYED_RELEASE_TABLET | Freq: Every morning | ORAL | Status: DC

## 2013-12-04 MED ORDER — LISINOPRIL 40 MG PO TABS: 40.0000 mg | ORAL_TABLET | Freq: Every day | ORAL | Status: DC

## 2013-12-04 MED ORDER — ROSUVASTATIN CALCIUM 20 MG PO TABS: 20.0000 mg | ORAL_TABLET | Freq: Every day | ORAL | Status: AC

## 2013-12-04 MED ORDER — SERTRALINE HCL 100 MG PO TABS: 100.0000 mg | ORAL_TABLET | Freq: Every day | ORAL | Status: DC

## 2013-12-04 MED ORDER — CLOPIDOGREL BISULFATE 75 MG PO TABS: 75.0000 mg | ORAL_TABLET | Freq: Every day | ORAL | Status: DC

## 2013-12-04 MED ORDER — DOXEPIN HCL 100 MG PO CAPS: 100.0000 mg | ORAL_CAPSULE | Freq: Every day | ORAL | Status: DC

## 2013-12-04 NOTE — Assessment & Plan Note (Signed)
She admits to feeling depressed around this time of year because several of her family members passed away during this time of year.  She reports anhedonia and poor appetite.  She denies suicidal or homicidal ideation.  She has not taken Zoloft or Doxepin in over a month because she was out of medication (due to cost).  She is requesting referral back to Dr. Casimiro Needle (psychiatry).   - she will be able to afford her medications on Dec 3 - will talk with CSW about other medication options in the interim - will refer to Dr. Casimiro Needle - she was provided with the phone numbers for emergency mental health service providers, including Mobile [facility] (unit will come to the patient, wherever they are) - she was advised to use these phone numbers or go to the ED if she becomes overwhelmed by depressive symptoms or has thought of suicide

## 2013-12-04 NOTE — Assessment & Plan Note (Signed)
Did not get to discuss this at the visit but she still has not gotten mammo that I referred her for.  She is scheduled to see Dr. Henrene Pastor (GI) next month to arrange colonoscopy.

## 2013-12-04 NOTE — Assessment & Plan Note (Signed)
She was out of levothyroxine for about a month but recently was able to afford to pick up the rx. - check TSH

## 2013-12-04 NOTE — Assessment & Plan Note (Addendum)
She reports headache x two months.  Burning/sharp 9/10 B/L hatband distribution pain.  Non-stop but eases up when she sleeps.  Gradual onset, no preceding trauma.  Some nausea but no vomiting.  + photophobia and phonophobia.  Admits increased stress recently (husband sick, anniversaries of loved ones deaths). She says the headache started after her sister-in-law moved in with her and her husband.  Flexeril helps the headache.  She only use it when muscles "feels really tense."  No acute onset and timeframe (several weeks) makes SAH very unlikely.  No neuro deficits, vision change, double vision or worsening of headache with cough or position change making intracranial process very unlikely.  Differential includes tension (precipitated by stress, hatband distribution, she is tender to palpation around the "hatband", responds to flexeril) vs migraine (nausea, photophobia and phonophobia).  Her hypertension (she has not been able to afford meds in the past few months) may be playing a role in the headache as well. - decrease stress - ice (wrapped in a towel) applied to the area prn - relaxation exercise, massage - resume BP medications - Flexeril 10mg  qHS prn #14, no refills - renal function precludes the use of NSAIDs - return to clinic next week for follow-up

## 2013-12-04 NOTE — Assessment & Plan Note (Addendum)
Leg pain has improved.  She has not taken Plavix in over a month due to cost (in Medicare "donut hole").  She says she should have the money in about two weeks.  - she will be able to afford her medications on Dec 3 - will reach out to [place] regarding other medication resources in the interim

## 2013-12-04 NOTE — Assessment & Plan Note (Addendum)
BP Readings from Last 3 Encounters:  12/01/13 174/118  07/20/13 136/84  04/12/13 152/88    Lab Results  Component Value Date   NA 139 07/20/2013   K 4.5 07/20/2013   CREATININE 1.34* 07/20/2013    Assessment: Blood pressure control:  uncontrolled Progress toward BP goal:   deteriorated Comments: Ms. Sipp's BP previously well controlled.  She reports that she has been out of her BP medications for the past month because she is in the Medicare "donut hole" and could not afford the cost.  She recently restarted lisinopril has not been able to pick up the other medications yet.  She says she will have the money by Dec. 3.  I explained that she needs to fill the medications today.  She says she will call a friend to help her out so she can may for rx.  Plan: Medications:  continue current medications: triamterene-HCTZ 37.5-25mg  daily, lisinopril 40mg  daily and amlodipine 10mg  daily Other plans: BMP today.  She will pick up meds.  Will have her return to clinic next week for BP check.

## 2013-12-05 ENCOUNTER — Telehealth: Payer: Self-pay | Admitting: Licensed Clinical Social Worker

## 2013-12-05 ENCOUNTER — Encounter: Payer: Self-pay | Admitting: Licensed Clinical Social Worker

## 2013-12-05 NOTE — Telephone Encounter (Signed)
[name] was referred to CSW for referral to Dr. Casimiro Needle.  [name] is familiar with Dr. Casimiro Needle and was current with him up until last year.  Pt has not seen him this year.  [name] would like to return and establish care with [facility] again.  Pt aware, scheduling is several weeks out and referral is being sent to [facility] today. In addition to psychiatry referral, [name] has reached her Medicare-D donut hole and has been unable to afford her medication.  Pt has applied to the Low-Income subsidy program, has yet to hear the reply.  CSW provided [name] information on the [place] program and provided information from brochure and contact information.  CSW will send information in the mail.  [name]  has CSW contact information and is aware CSW is available as needed.  Pt denies additional CSW needs at this time.

## 2013-12-22 ENCOUNTER — Encounter (HOSPITAL_COMMUNITY): Payer: Self-pay | Admitting: Vascular Surgery

## 2013-12-26 ENCOUNTER — Ambulatory Visit (INDEPENDENT_AMBULATORY_CARE_PROVIDER_SITE_OTHER): Payer: Medicare HMO | Admitting: Internal Medicine

## 2013-12-26 ENCOUNTER — Encounter: Payer: Self-pay | Admitting: Internal Medicine

## 2013-12-26 VITALS — BP 138/90 | HR 84 | Ht 61.25 in | Wt 214.2 lb

## 2013-12-26 DIAGNOSIS — K219 Gastro-esophageal reflux disease without esophagitis: Secondary | ICD-10-CM

## 2013-12-26 DIAGNOSIS — Z1211 Encounter for screening for malignant neoplasm of colon: Secondary | ICD-10-CM

## 2013-12-26 DIAGNOSIS — R1310 Dysphagia, unspecified: Secondary | ICD-10-CM

## 2013-12-26 MED ORDER — MOVIPREP 100 G PO SOLR: 1.0000 | Freq: Once | ORAL | Status: DC

## 2013-12-26 MED ORDER — PANTOPRAZOLE SODIUM 40 MG PO TBEC: 40.0000 mg | DELAYED_RELEASE_TABLET | Freq: Two times a day (BID) | ORAL | Status: DC

## 2013-12-26 NOTE — Patient Instructions (Addendum)
You have been given a separate informational sheet regarding your tobacco use, the importance of quitting and local resources to help you quit.   We have sent the following medications to your pharmacy for you to pick up at your convenience:  Protonix - increase to twice a day.  You have been scheduled for an endoscopy and colonoscopy. Please follow the written instructions given to you at your visit today. Please pick up your prep at the pharmacy within the next 1-3 days. If you use inhalers (even only as needed), please bring them with you on the day of your procedure. Your physician has requested that you go to www.startemmi.com and enter the access code given to you at your visit today. This web site gives a general overview about your procedure. However, you should still follow specific instructions given to you by our office regarding your preparation for the procedure.

## 2013-12-26 NOTE — Progress Notes (Signed)
HISTORY OF PRESENT ILLNESS:  [name] is a 63 y.o. female with multiple medical problems as listed below, including peripheral vascular disease for which she sporadically (by her own admission) takes Plavix. She is sent today by her primary care provider regarding refractory reflux, dysphagia, and the need for colon cancer screening. Patient reports having had an upper endoscopy at age 66 while living in [place]. She tells me that she was diagnosed with "ulcers". No upper endoscopy since. Never had colonoscopy. Current complaints include breakthrough acid reflux symptoms (substernal pyrosis and pharyngeal burning) despite once daily pantoprazole 40 mg each morning. She also reports intermittent dysphagia to solid foods in the mid chest. As well, occasional "choking" in the pharynx with liquids or saliva. She denies vomiting, weight loss, melena, or hematochezia.  REVIEW OF SYSTEMS:  All non-GI ROS negative except for anxiety, confusion, fatigue, headaches, itching, muscle cramps, night sweats, sleeping problems, voice change  Past Medical History  Diagnosis Date  . Hypothyroidism   . Hypertension   . TIA (transient ischemic attack)   . PUD (peptic ulcer disease)   . HLD (hyperlipidemia)   . Depression   . GERD (gastroesophageal reflux disease)   . Diverticulosis   . PAD (peripheral artery disease)   . Anal fissure   . Anxiety   . Chronic headaches   . Cataract   . Glaucoma     Past Surgical History  Procedure Laterality Date  . Total thyroidectomy    . Aortogram    . Iliac artery angioplasty and stenting Left 12/2012  . Sfa stent Left 2012    at [facility]  . Abdominal aortagram N/A 01/20/2013    Procedure: ABDOMINAL Maxcine Ham;  Surgeon: Conrad Hubbell, MD;  Location: [facility] CATH LAB;  Service: Cardiovascular;  Laterality: N/A;  . Anal fissure repair    . Cataract extraction Left     Social History [name]  reports that she has been smoking  Cigarettes.  She has been smoking about 0.50 packs per day. She has never used smokeless tobacco. She reports that she drinks alcohol. She reports that she does not use illicit drugs.  family history includes AAA (abdominal aortic aneurysm) in her father; Anuerysm in her brother; Diabetes in her mother; HIV in her brother; Heart disease in her brother, brother, and mother; Hypertension in her father; Kidney cancer in her brother; Liver cancer in her brother; Peripheral vascular disease in her mother; Stomach cancer in her maternal aunt and paternal aunt; Uterine cancer in her sister.  Allergies  Allergen Reactions  . Asa [Aspirin] Nausea And Vomiting  . Penicillins Hives and Itching       PHYSICAL EXAMINATION: Vital signs: BP 138/90 mmHg  Pulse 84  Ht 5' 1.25" (1.556 m)  Wt 214 lb 4 oz (97.183 kg)  BMI 40.14 kg/m2  Constitutional: Pleasant, obese, generally well-appearing, no acute distress Psychiatric: alert and oriented x3, cooperative Eyes: extraocular movements intact, anicteric, conjunctiva pink Mouth: oral pharynx moist, no lesions. Poor dentition Neck: supple no lymphadenopathy Cardiovascular: heart regular rate and rhythm, no murmur Lungs: clear to auscultation bilaterally Abdomen: soft, obese, nontender, nondistended, no obvious ascites, no peritoneal signs, normal bowel sounds, no organomegaly. Prior midline surgical incision well-healed Rectal: Deferred until colonoscopy Extremities: no lower extremity edema bilaterally Skin: no lesions on visible extremities Neuro: No focal deficits.   ASSESSMENT:  #1. GERD. Significant breakthrough symptoms on once daily PPI #2. Intermittent solid food dysphagia. Rule out peptic stricture #  3. Colon cancer screening. Baseline risk #4. Multiple medical problems. Stable. #5. Peripheral vascular disease on Plavix, though the patient takes this quite sporadically   PLAN:  #1. Reflux precautions with attention to weight loss #2.  Increase pantoprazole to 40 mg twice daily. Prescription rewritten to reflect this change #3. Schedule upper endoscopy with probable esophageal dilation.The nature of the procedure, as well as the risks, benefits, and alternatives were carefully and thoroughly reviewed with the patient. Ample time for discussion and questions allowed. The patient understood, was satisfied, and agreed to proceed. #4. Screening colonoscopy.The nature of the procedure, as well as the risks, benefits, and alternatives were carefully and thoroughly reviewed with the patient. Ample time for discussion and questions allowed. The patient understood, was satisfied, and agreed to proceed. The patient is higher than baseline risk for her procedural work on her comorbidities. #5. Consult with the patient's vascular surgeon regarding the feasibility of holding Plavix 1 week prior to the procedural work.

## 2013-12-28 ENCOUNTER — Telehealth: Payer: Self-pay

## 2013-12-28 NOTE — Telephone Encounter (Signed)
  12/28/2013   RE: [name] DOB: 11/09/1950 MRN: [id number]   Dear Dr. Lawson,    We have scheduled the above patient for an endoscopic procedure. Our records show that she is on anticoagulation therapy.   Please advise as to how long the patient may come off her therapy of Plavix prior to the procedure, which is scheduled for 01/25/2014.  Please fax back/ or route the completed form to Geralynn Capri at [phone].   Sincerely,    Fannie Alomar Kester    

## 2014-01-02 ENCOUNTER — Telehealth: Payer: Self-pay

## 2014-01-02 NOTE — Telephone Encounter (Signed)
  12/28/2013   RE: [name] DOB: Nov 12, 1950 MRN: [id number]   Dear Dr. Kellie Simmering,    We have scheduled the above patient for an endoscopic procedure. Our records show that she is on anticoagulation therapy.   Please advise as to how long the patient may come off her therapy of Plavix prior to the procedure, which is scheduled for 01/25/2014.  Please fax back/ or route the completed form to [place] at [phone].   Sincerely,    Phillis Haggis

## 2014-01-04 NOTE — Telephone Encounter (Signed)
Lm on vm 

## 2014-01-04 NOTE — Telephone Encounter (Signed)
Left message with Peter Minium regarding permission for patient to hold Plavix before procedure.  Awaiting response

## 2014-01-04 NOTE — Telephone Encounter (Signed)
Per Dr. Kellie Simmering,  Ms. Hockenberry may stop her Plavix 7 days prior to her planned procedure on 01-25-14 by Dr. Henrene Pastor. She may resume her Plavix a few days post procedure according to Dr. Blanch Media guidelines.

## 2014-01-11 NOTE — Telephone Encounter (Signed)
Spoke with patient and let her know that, per Dr. Evelena Leyden office, she could hold her Plavix 7 days prior to her procedure.  Patient acknowledged and understood

## 2014-01-25 ENCOUNTER — Ambulatory Visit (AMBULATORY_SURGERY_CENTER): Payer: Commercial Managed Care - HMO | Admitting: Internal Medicine

## 2014-01-25 ENCOUNTER — Encounter: Payer: Self-pay | Admitting: Internal Medicine

## 2014-01-25 VITALS — BP 139/89 | HR 72 | Temp 96.7°F | Resp 10 | Ht 61.0 in | Wt 214.0 lb

## 2014-01-25 DIAGNOSIS — Z1211 Encounter for screening for malignant neoplasm of colon: Secondary | ICD-10-CM | POA: Diagnosis not present

## 2014-01-25 DIAGNOSIS — D122 Benign neoplasm of ascending colon: Secondary | ICD-10-CM

## 2014-01-25 DIAGNOSIS — D123 Benign neoplasm of transverse colon: Secondary | ICD-10-CM

## 2014-01-25 DIAGNOSIS — K635 Polyp of colon: Secondary | ICD-10-CM

## 2014-01-25 DIAGNOSIS — K31819 Angiodysplasia of stomach and duodenum without bleeding: Secondary | ICD-10-CM

## 2014-01-25 DIAGNOSIS — R1310 Dysphagia, unspecified: Secondary | ICD-10-CM

## 2014-01-25 DIAGNOSIS — K219 Gastro-esophageal reflux disease without esophagitis: Secondary | ICD-10-CM | POA: Diagnosis not present

## 2014-01-25 MED ORDER — SODIUM CHLORIDE 0.9 % IV SOLN: 500.0000 mL | INTRAVENOUS | Status: DC

## 2014-01-25 NOTE — Progress Notes (Signed)
Called to room to assist during endoscopic procedure.  Patient ID and intended procedure confirmed with present staff. Received instructions for my participation in the procedure from the performing physician.  

## 2014-01-25 NOTE — Op Note (Signed)
[place]  [address]. [place], [id number]   COLONOSCOPY PROCEDURE REPORT  PATIENT: [name], [name]  MR#: [id number] BIRTHDATE: 1950/04/13 , 54  yrs. old GENDER: female ENDOSCOPIST: Eustace Quail, MD REFERRED GM:WNUU Wilson, M.D. PROCEDURE DATE:  01/25/2014 PROCEDURE:   Colonoscopy with snare polypectomy x 2 First Screening Colonoscopy - Avg.  risk and is 50 yrs.  old or older - No.  Prior Negative Screening - Now for repeat screening. N/A  History of Adenoma - Now for follow-up colonoscopy & has been > or = to 3 yrs.  N/A  Polyps Removed Today? Yes. ASA CLASS:   Class III INDICATIONS:average risk for colorectal cancer. MEDICATIONS: Monitored anesthesia care and Propofol 200 mg IV  DESCRIPTION OF PROCEDURE:   After the risks benefits and alternatives of the procedure were thoroughly explained, informed consent was obtained.  The digital rectal exam revealed no abnormalities of the rectum.   The LB [id number]  endoscope was introduced through the anus and advanced to the cecum, which was identified by both the appendix and ileocecal valve. No adverse events experienced.   The quality of the prep was excellent, using MoviPrep  The instrument was then slowly withdrawn as the colon was fully examined.  COLON FINDINGS: Two polyps ranging between 3-33mm in size were found in the transverse colon and ascending colon.  A polypectomy was performed with a cold snare.  The resection was complete, the polyp tissue was completely retrieved and sent to histology.   There was moderate diverticulosis noted throughout the entire examined colon. The examination was otherwise normal.  Retroflexed views revealed internal hemorrhoids. The time to cecum=1 minutes 59 seconds. Withdrawal time=9 minutes 31 seconds.  The scope was withdrawn and the procedure completed. COMPLICATIONS: There were no immediate complications.  ENDOSCOPIC IMPRESSION: 1.   Two polyps were found  in the transverse colon and ascending colon; polypectomy was performed with a cold snare 2.   Moderate diverticulosis was noted throughout the entire examined colon 3.   The examination was otherwise normal  RECOMMENDATIONS: 1.  Repeat colonoscopy in 5 years if polyp adenomatous; otherwise 10 years 2.  Upper endoscopy today (please see report)  eSigned:  Eustace Quail, MD 01/25/2014 2:29 PM   cc: Duwaine Maxin MD and The Patient

## 2014-01-25 NOTE — Progress Notes (Signed)
No problems noted in the recovery room. maw 

## 2014-01-25 NOTE — Progress Notes (Signed)
Pt was transported to front of building for transportation service to care her home. maw

## 2014-01-25 NOTE — Progress Notes (Signed)
Report to PACU, RN, vss, BBS= Clear.  

## 2014-01-25 NOTE — Progress Notes (Signed)
Patient very anxious regarding IV placement. Crying on insertion.calming once cathetar in place.  Patient stating after anesthesia, she always fights.

## 2014-01-25 NOTE — Op Note (Signed)
[place]  [address]. [place], [id number]   ENDOSCOPY PROCEDURE REPORT  PATIENT: Candice, Harvey  MR#: [id number] BIRTHDATE: Jun 09, 1950 , 59  yrs. old GENDER: female ENDOSCOPIST: Eustace Quail, MD REFERRED BY:  Duwaine Maxin, M.D. PROCEDURE DATE:  01/25/2014 PROCEDURE:  EGD, diagnostic ASA CLASS:     Class III INDICATIONS:  history of esophageal reflux and dysphagia. MEDICATIONS: Monitored anesthesia care and Propofol 100 mg IV TOPICAL ANESTHETIC: none  DESCRIPTION OF PROCEDURE: After the risks benefits and alternatives of the procedure were thoroughly explained, informed consent was obtained.  The LB [id number] endoscope was introduced through the mouth and advanced to the second portion of the duodenum , Without limitations.  The instrument was slowly withdrawn as the mucosa was fully examined.    EXAM:The esophagus was normal throughout.  No active inflammation or stricture.  Stomach was normal.  The duodenal bulb was normal. 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 Retroflexed views revealed no abnormalities.     The scope was then withdrawn from the patient and the procedure completed.  COMPLICATIONS: There were no immediate complications.  ENDOSCOPIC IMPRESSION: 1. Incidental duodenal AVMs. Otherwise normal EGD 2. Chronic GERD  RECOMMENDATIONS: 1.  Anti-reflux regimen to be followed. Stop smoking 2.  Continue pantoprazole twice daily 3.  Resume Plavix today 4.  Return to the care of Dr. Redmond Pulling  REPEAT EXAM:  eSigned:  Eustace Quail, MD 01/25/2014 2:37 PM    OE:[id number], Cristie Hem MD and The Patient

## 2014-01-25 NOTE — Patient Instructions (Signed)
YOU HAD AN ENDOSCOPIC PROCEDURE TODAY AT THE [facility]: Refer to the procedure report that was given to you for any specific questions about what was found during the examination.  If the procedure report does not answer your questions, please call your gastroenterologist to clarify.  If you requested that your care partner not be given the details of your procedure findings, then the procedure report has been included in a sealed envelope for you to review at your convenience later.  YOU SHOULD EXPECT: Some feelings of bloating in the abdomen. Passage of more gas than usual.  Walking can help get rid of the air that was put into your GI tract during the procedure and reduce the bloating. If you had a lower endoscopy (such as a colonoscopy or flexible sigmoidoscopy) you may notice spotting of blood in your stool or on the toilet paper. If you underwent a bowel prep for your procedure, then you may not have a normal bowel movement for a few days.  DIET: Your first meal following the procedure should be a light meal and then it is ok to progress to your normal diet.  A half-sandwich or bowl of soup is an example of a good first meal.  Heavy or fried foods are harder to digest and may make you feel nauseous or bloated.  Likewise meals heavy in dairy and vegetables can cause extra gas to form and this can also increase the bloating.  Drink plenty of fluids but you should avoid alcoholic beverages for 24 hours.  ACTIVITY: Your care partner should take you home directly after the procedure.  You should plan to take it easy, moving slowly for the rest of the day.  You can resume normal activity the day after the procedure however you should NOT DRIVE or use heavy machinery for 24 hours (because of the sedation medicines used during the test).    SYMPTOMS TO REPORT IMMEDIATELY: A gastroenterologist can be reached at any hour.  During normal business hours, 8:30 AM to 5:00 PM Monday through Friday,  call [phone].  After hours and on weekends, please call the GI answering service at [phone] who will take a message and have the physician on call contact you.   Following lower endoscopy (colonoscopy or flexible sigmoidoscopy):  Excessive amounts of blood in the stool  Significant tenderness or worsening of abdominal pains  Swelling of the abdomen that is new, acute  Fever of 100F or higher  Following upper endoscopy (EGD)  Vomiting of blood or coffee ground material  New chest pain or pain under the shoulder blades  Painful or persistently difficult swallowing  New shortness of breath  Fever of 100F or higher  Black, tarry-looking stools  FOLLOW UP: If any biopsies were taken you will be contacted by phone or by letter within the next 1-3 weeks.  Call your gastroenterologist if you have not heard about the biopsies in 3 weeks.  Our staff will call the home number listed on your records the next business day following your procedure to check on you and address any questions or concerns that you may have at that time regarding the information given to you following your procedure. This is a courtesy call and so if there is no answer at the home number and we have not heard from you through the emergency physician on call, we will assume that you have returned to your regular daily activities without incident.  SIGNATURES/CONFIDENTIALITY: You and/or your care   partner have signed paperwork which will be entered into your electronic medical record.  These signatures attest to the fact that that the information above on your After Visit Summary has been reviewed and is understood.  Full responsibility of the confidentiality of this discharge information lies with you and/or your care-partner.    Handouts were given to your care partner on polyps, diverticulosis, a high fiber diet with liberal fluid intake, and GERD. You may resume your current medications today.  Restart plavix  today per Dr. Henrene Pastor.  Continue taking pantoprazole twice daily per Dr. Henrene Pastor.  Await biopsy results. Stop smoking per Dr. Henrene Pastor. Please call if any questions or concerns.

## 2014-01-26 ENCOUNTER — Telehealth: Payer: Self-pay | Admitting: *Deleted

## 2014-01-26 NOTE — Telephone Encounter (Signed)
  Follow up Call-  Call back number 01/25/2014  Post procedure Call Back phone  # [phone]  Permission to leave phone message Yes    [facility]

## 2014-01-31 ENCOUNTER — Encounter: Payer: Self-pay | Admitting: Internal Medicine

## 2014-04-13 ENCOUNTER — Telehealth: Payer: Self-pay | Admitting: *Deleted

## 2014-04-13 NOTE — Telephone Encounter (Signed)
I would recommend over the counter cold remedies.  If no relief, she can call the clinic for an earlier appointment.  At this point, she should try the cold remedies and keep her already scheduled appointment with her PCP Dr. Redmond Pulling on 04/19/2014.  Thank You.

## 2014-04-13 NOTE — Telephone Encounter (Signed)
Pt informed and voices understanding 

## 2014-04-13 NOTE — Telephone Encounter (Signed)
Pt called with c/o chest congestion, and headache. She states the headache has been present since Sunday,  over entire head with a feeling of burning pain.  Hx: of migraines in past but not like this.  This is new for her and pain is constant. She has blurry vision, also pain behind her eyes.   She has tried cold towels to head, tylenol with no relief.  She also has congestion and a milky mucous discharge.  She has tried Tussinex DM She is not sleeping, has no taste so not eating or drinking much.  Temp 101. No other c/o  Pt # [phone]

## 2014-04-19 ENCOUNTER — Encounter: Payer: Self-pay | Admitting: Internal Medicine

## 2014-04-19 ENCOUNTER — Ambulatory Visit (INDEPENDENT_AMBULATORY_CARE_PROVIDER_SITE_OTHER): Payer: Medicare HMO | Admitting: Internal Medicine

## 2014-04-19 VITALS — BP 147/88 | HR 82 | Temp 98.3°F | Ht 61.4 in | Wt 217.4 lb

## 2014-04-19 DIAGNOSIS — R531 Weakness: Secondary | ICD-10-CM

## 2014-04-19 DIAGNOSIS — E559 Vitamin D deficiency, unspecified: Secondary | ICD-10-CM

## 2014-04-19 DIAGNOSIS — E039 Hypothyroidism, unspecified: Secondary | ICD-10-CM

## 2014-04-19 DIAGNOSIS — I10 Essential (primary) hypertension: Secondary | ICD-10-CM

## 2014-04-19 DIAGNOSIS — F332 Major depressive disorder, recurrent severe without psychotic features: Secondary | ICD-10-CM

## 2014-04-19 DIAGNOSIS — M6281 Muscle weakness (generalized): Secondary | ICD-10-CM

## 2014-04-19 DIAGNOSIS — F17200 Nicotine dependence, unspecified, uncomplicated: Secondary | ICD-10-CM | POA: Insufficient documentation

## 2014-04-19 LAB — BASIC METABOLIC PANEL WITH GFR
BUN: 9 mg/dL (ref 6–23)
CALCIUM: 8.8 mg/dL (ref 8.4–10.5)
CO2: 26 meq/L (ref 19–32)
Chloride: 104 mEq/L (ref 96–112)
Creat: 1.31 mg/dL — ABNORMAL HIGH (ref 0.50–1.10)
GFR, EST AFRICAN AMERICAN: 49 mL/min — AB
GFR, Est Non African American: 43 mL/min — ABNORMAL LOW
GLUCOSE: 87 mg/dL (ref 70–99)
Potassium: 4.1 mEq/L (ref 3.5–5.3)
Sodium: 143 mEq/L (ref 135–145)

## 2014-04-19 LAB — TSH: TSH: 39.546 u[IU]/mL — ABNORMAL HIGH (ref 0.350–4.500)

## 2014-04-19 NOTE — Assessment & Plan Note (Signed)
No symptoms of hyper or hypo-active thyroid.  She is having sleeping difficulties that are likely related to stress from husband's illness. - TSH

## 2014-04-19 NOTE — Progress Notes (Signed)
   Subjective:    Patient ID: [name], female    DOB: 1950/08/11, 64 y.o.   MRN: [id number]  HPI Comments: [name] is a 64 year old woman with a PMH as below here for follow-up of her chronic conditions.  Please see problem based A&P for details.    Past Medical History  Diagnosis Date  . Hypothyroidism   . Hypertension   . TIA (transient ischemic attack)   . PUD (peptic ulcer disease)   . HLD (hyperlipidemia)   . Depression   . GERD (gastroesophageal reflux disease)   . Diverticulosis   . PAD (peripheral artery disease)   . Anal fissure   . Anxiety   . Chronic headaches   . Cataract   . Glaucoma   . Allergy     Review of Systems  Constitutional: Positive for fever, chills and appetite change.       T 101F at home last week with URI symptoms x 2 days.  Symptoms improved.  HENT: Positive for postnasal drip. Negative for hearing loss and sore throat.   Eyes: Positive for visual disturbance.       Blurry vision - she thinks cataracts are worsening.  Respiratory: Positive for cough. Negative for shortness of breath.   Cardiovascular: Positive for leg swelling. Negative for chest pain and palpitations.       Legs swell when she misses diuretic.  Gastrointestinal: Positive for nausea and abdominal pain. Negative for vomiting, diarrhea and constipation.       Occasional "burning" she relates to reflux and not eating  Genitourinary: Negative for dysuria and hematuria.  Neurological: Positive for light-headedness. Negative for syncope.       Occasional lightheadedness with quick position change.  Psychiatric/Behavioral: Positive for dysphoric mood. Negative for suicidal ideas.       Filed Vitals:   04/19/14 1325 04/19/14 1520  BP: 154/91 147/88  Pulse: 87 82  Temp: 98.3 F (36.8 C)   TempSrc: Oral   Height: 5' 1.4" (1.56 m)   Weight: 217 lb 6.4 oz (98.612 kg)   SpO2: 100%    Objective:   Physical Exam  Constitutional: She is oriented to person, place, and  time. She appears well-developed. No distress.  HENT:  Head: Normocephalic and atraumatic.  Mouth/Throat: Oropharynx is clear and moist. No oropharyngeal exudate.  Eyes: EOM are normal. Pupils are equal, round, and reactive to light.  Neck: Neck supple.  Cardiovascular: Normal rate, regular rhythm and normal heart sounds.  Exam reveals no gallop and no friction rub.   No murmur heard. Pulmonary/Chest: Effort normal and breath sounds normal. No respiratory distress. She has no wheezes. She has no rales.  Abdominal: Soft. Bowel sounds are normal. She exhibits no distension and no mass. There is tenderness. There is no rebound and no guarding.  Mild diffuse TTP.  Musculoskeletal: Normal range of motion. She exhibits no edema or tenderness.  Neurological: She is alert and oriented to person, place, and time. She displays normal reflexes. No cranial nerve deficit. Coordination normal.  Strength and sensation equal and intact upper and lower.  Skin: Skin is warm. She is not diaphoretic.  Psychiatric: She has a normal mood and affect. Her behavior is normal.  Vitals reviewed.         Assessment & Plan:  Please see problem based assessment and plan.

## 2014-04-19 NOTE — Assessment & Plan Note (Signed)
  Assessment: Progress toward smoking cessation:   1 pack per week Barriers to progress toward smoking cessation:   stress (increases to 1 pack every two days when husband in the hospital) Comments:   Plan: Instruction/counseling given:  I counseled patient on the dangers of tobacco use, advised patient to stop smoking, and reviewed strategies to maximize success. Educational resources provided:  [employer] account [employer]) brochure (denies) Self management tools provided:    Medications to assist with smoking cessation:  None Patient agreed to the following self-care plans for smoking cessation:    Other plans:

## 2014-04-19 NOTE — Assessment & Plan Note (Addendum)
BP Readings from Last 3 Encounters:  04/19/14 147/88  01/25/14 139/89  12/26/13 138/90    Lab Results  Component Value Date   NA 139 07/20/2013   K 4.5 07/20/2013   CREATININE 1.34* 07/20/2013    Assessment: Blood pressure control:  slightly elevated  Progress toward BP goal:   near goal Comments: Patient reports compliance with medications.  She reported being in the "donut hole" the last time I saw her and said it was difficult to afford the med at that time but she says she is now able to afford them.    Plan: Medications:  continue current medications:  triamterene-HCTZ 37.5-25mg  daily, lisinopril 40mg  and amlodipine 10mg  daily Educational resources provided: brochure (denies) Other plans: BMP today.  RTC ini 3 months.

## 2014-04-19 NOTE — Patient Instructions (Signed)
1. I am sending you for pictures of your head and neck because of the weakness you experienced last week.  Please be sure to see Dr. Casimiro Needle in June.   2. Please take all medications as prescribed.    3. If you have worsening of your symptoms or new symptoms arise, please call the clinic ([phone]), or go to the ER immediately if symptoms are severe.   Please come back to see me in 3 months.

## 2014-04-19 NOTE — Assessment & Plan Note (Addendum)
She is having depressive symptoms but no SI.  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  She is compliant with Zoloft and Doxepin.  She says she can call her close friend or her daughter if she were to become overwhelmed. - appt with Dr. Casimiro Needle 06/08; patient said she had called and tried to set appt up but was unable to get one so she was appreciative we set this appt up. - she was advised to go to [facility] ED (they have a behavioral health section) where she can get care 24/7 if she has a depressive crisis

## 2014-04-20 ENCOUNTER — Telehealth: Payer: Self-pay | Admitting: Internal Medicine

## 2014-04-20 DIAGNOSIS — R531 Weakness: Secondary | ICD-10-CM | POA: Insufficient documentation

## 2014-04-20 NOTE — Telephone Encounter (Signed)
I called Ms. Ganaway RE: her elevated TSH but reached her voicemail.  I have added a free T4 to labs and will adjust Synthroid as necessary after I speak with her and confirm she is taking it and depending on level of T4.   I will attempt to call her again in the morning.

## 2014-04-20 NOTE — Assessment & Plan Note (Addendum)
She reports painful left sided headache and neck pain last week that was more intense than her usual.  She said she felt like someone put an axe in her head and the whole left side of her head was painful.  She also noted left upper and lower extremity weakness at the same time.  She says she was concerned she may be having a stroke but she did not seek emergency help.  This occurred just after she had dropped her husband off at the ED and he was being admitted.  She said symptoms began to resolve yesterday.  She has no neuro deficits on exam today.  I am concerned that she has had a TIA or CVA given her risk factors.   - CT head w/o contrast and CTA neck with contrast (pre and post IV hydration ordered due to her CKD)

## 2014-04-21 ENCOUNTER — Other Ambulatory Visit: Payer: Self-pay | Admitting: Internal Medicine

## 2014-04-21 ENCOUNTER — Telehealth: Payer: Self-pay | Admitting: Internal Medicine

## 2014-04-21 DIAGNOSIS — E039 Hypothyroidism, unspecified: Secondary | ICD-10-CM

## 2014-04-21 LAB — T4, FREE: FREE T4: 0.74 ng/dL — AB (ref 0.80–1.80)

## 2014-04-21 MED ORDER — LEVOTHYROXINE SODIUM 112 MCG PO TABS: 112.0000 ug | ORAL_TABLET | Freq: Every day | ORAL | Status: DC

## 2014-04-21 NOTE — Telephone Encounter (Signed)
I spoke with patient and confirmed she is taking the correct dose of levothyroxine (155mcg). She got her bottle and described the yellow pill inscribed with GG 335 which is c/w levothyroxine 144mcg pill.   Pharmacy says it was last filled in early March.  Pharmacy confirms that the supplier of this generic has not recently changed.  Given this information and think she has been compliant.  I have instructed the patient to take 140mcg tomorrow (Saturday) morning, then pick up the new rx for 152mcg (which should be pink) and start taking the 173mcg on Sunday morning (and stop taking the 159mcg).  I will recheck her TSH in 4 weeks when she returns for follow-up.  She understands and is agreeable.

## 2014-04-23 LAB — VITAMIN D 1,25 DIHYDROXY
VITAMIN D3 1, 25 (OH): 94 pg/mL
Vitamin D 1, 25 (OH)2 Total: 94 pg/mL — ABNORMAL HIGH (ref 18–72)

## 2014-04-24 NOTE — Progress Notes (Signed)
Internal Medicine Clinic Attending  Case discussed with Dr. Wilson soon after the resident saw the patient.  We reviewed the resident's history and exam and pertinent patient test results.  I agree with the assessment, diagnosis, and plan of care documented in the resident's note.  

## 2014-04-28 ENCOUNTER — Telehealth: Payer: Self-pay | Admitting: *Deleted

## 2014-04-28 NOTE — Telephone Encounter (Signed)
Call to patient to ask about dosage of Vit D.  Patient is currently taking 3000 units per day.  Fraser Din was advised per order of Dr. Redmond Pulling to take only 600 IU daily .  Labs to be at a later date to check levels. Patient asked about TSH labs.  Patient to come in 2 weeks to have a TSH done.  Patient voiced understanding of the plan.  Sander Nephew, RN 04/28/2014 10:09 AM.

## 2014-05-18 ENCOUNTER — Telehealth: Payer: Self-pay | Admitting: Internal Medicine

## 2014-05-18 NOTE — Telephone Encounter (Signed)
Call to patient to confirm appointment for 05/19/14 at 2:00 lmtcb

## 2014-05-19 ENCOUNTER — Other Ambulatory Visit (INDEPENDENT_AMBULATORY_CARE_PROVIDER_SITE_OTHER): Payer: Medicare HMO

## 2014-05-19 DIAGNOSIS — E039 Hypothyroidism, unspecified: Secondary | ICD-10-CM

## 2014-05-20 LAB — TSH: TSH: 7.454 u[IU]/mL — AB (ref 0.350–4.500)

## 2014-05-22 ENCOUNTER — Other Ambulatory Visit: Payer: Self-pay | Admitting: Internal Medicine

## 2014-05-22 DIAGNOSIS — E039 Hypothyroidism, unspecified: Secondary | ICD-10-CM

## 2014-05-22 NOTE — Addendum Note (Signed)
Addended by: Truddie Crumble on: 05/22/2014 01:45 PM   Modules accepted: Orders

## 2014-05-23 LAB — T4, FREE: Free T4: 1.23 ng/dL (ref 0.80–1.80)

## 2014-06-14 ENCOUNTER — Encounter: Payer: Self-pay | Admitting: Internal Medicine

## 2014-06-19 ENCOUNTER — Other Ambulatory Visit: Payer: Self-pay | Admitting: Internal Medicine

## 2014-06-20 ENCOUNTER — Telehealth: Payer: Self-pay | Admitting: Internal Medicine

## 2014-06-20 NOTE — Telephone Encounter (Signed)
Call to patient to confirm appointment for 06/21/14 at 2:15 lmtcb

## 2014-06-21 ENCOUNTER — Ambulatory Visit (HOSPITAL_COMMUNITY): Payer: Self-pay | Admitting: Psychiatry

## 2014-06-21 ENCOUNTER — Encounter: Payer: Self-pay | Admitting: Internal Medicine

## 2014-07-03 ENCOUNTER — Encounter: Payer: Self-pay | Admitting: *Deleted

## 2014-07-03 NOTE — Progress Notes (Signed)
Pt stopped into clinic asking to be seen today. C/o headaches X 3 weeks, Blood in stool, urine and she is coughing up blood.  Sometimes streaks of blood, sometimes just bright red blood.  She feels weak and the pain from headache makes her feel like she will pass out.  Last visit 04/19/14 On Plavix.  Also pt's husband died last week.  Unable to see in clinic today.  I talked with Dr Dareen Piano and he would like pt seen today. Informed pt and she will go to [facility] now for evaluation.

## 2014-07-03 NOTE — Progress Notes (Signed)
Thank you [name]... She should be seen in urgent care since we dont have an appointment today

## 2014-07-06 ENCOUNTER — Encounter: Payer: Self-pay | Admitting: Internal Medicine

## 2014-07-06 ENCOUNTER — Ambulatory Visit (INDEPENDENT_AMBULATORY_CARE_PROVIDER_SITE_OTHER): Payer: Commercial Managed Care - HMO | Admitting: Internal Medicine

## 2014-07-06 VITALS — BP 158/83 | HR 80 | Temp 98.0°F | Resp 20 | Ht 61.25 in | Wt 211.5 lb

## 2014-07-06 DIAGNOSIS — R519 Headache, unspecified: Secondary | ICD-10-CM

## 2014-07-06 DIAGNOSIS — E034 Atrophy of thyroid (acquired): Secondary | ICD-10-CM

## 2014-07-06 DIAGNOSIS — R51 Headache: Secondary | ICD-10-CM

## 2014-07-06 DIAGNOSIS — E039 Hypothyroidism, unspecified: Secondary | ICD-10-CM

## 2014-07-06 DIAGNOSIS — E038 Other specified hypothyroidism: Secondary | ICD-10-CM | POA: Diagnosis not present

## 2014-07-06 LAB — TSH: TSH: 6.005 u[IU]/mL — ABNORMAL HIGH (ref 0.350–4.500)

## 2014-07-06 MED ORDER — CYCLOBENZAPRINE HCL 10 MG PO TABS: 10.0000 mg | ORAL_TABLET | Freq: Three times a day (TID) | ORAL | Status: DC | PRN

## 2014-07-06 NOTE — Patient Instructions (Signed)
General Instructions:  Please continue to take Tylenol for your headache.  I want you to take Flexeril for muscle spasm and also to apply heat to the area.  Please bring your medicines with you each time you come to clinic.  Medicines may include prescription medications, over-the-counter medications, herbal remedies, eye drops, vitamins, or other pills.   Progress Toward Treatment Goals:  Treatment Goal 12/13/2012  Blood pressure unchanged  Stop smoking smoking the same amount    Self Care Goals & Plans:  Self Care Goal 07/06/2014  Manage my medications take my medicines as prescribed; refill my medications on time; bring my medications to every visit  Monitor my health keep track of my blood pressure  Eat healthy foods eat more vegetables; eat foods that are low in salt; eat baked foods instead of fried foods  Be physically active -  Stop smoking cut down the number of cigarettes smoked  Other -  Meeting treatment goals -    No flowsheet data found.   Care Management & Community Referrals:  Referral 09/20/2012  Referrals made for care management support social worker

## 2014-07-06 NOTE — Assessment & Plan Note (Signed)
Her symptoms fit with a tension headahce, on exam she does have significant cervical paraspinal tenderness which is also suggestive.  I suspect this is worsened by stress from husband's illness then passing. - Will have her continue tylenol, she can also take flexeril for muscle spasm and apply heat to area. - She has yet to complete the CT head and neck that dr Redmond Pulling ordered for possible TIA (reports was not a good time given husband's illness) this may also provide additional information.

## 2014-07-06 NOTE — Assessment & Plan Note (Signed)
TSH was 7 at last visit 2 months ago, will recheck.

## 2014-07-06 NOTE — Progress Notes (Signed)
[facility] Subjective:   Patient ID: [name] female   DOB: 27-Feb-1950 64 y.o.   MRN: [id number]  HPI: [name] is a 64 y.o. female with a PMH detailed below who presents for follow up of headaches.   She reports she has had headaches for the last few months and they are not any better, she would like answers for why she is getting these headaches.  She reports that the headaches are squeezing and goes from her eyebrows to her neck.  She does admit increased neck pain.  She does endorse some photophobia but denies phonophobia.  She denies any seizure history, focal numbness or focal weakness.  She had not hit her head.  She does report she was under increased stress as her husband has been ill but finally passed away recently, this has weighted on her heavily.    Past Medical History  Diagnosis Date  . Hypothyroidism   . Hypertension   . TIA (transient ischemic attack)   . PUD (peptic ulcer disease)   . HLD (hyperlipidemia)   . Depression   . GERD (gastroesophageal reflux disease)   . Diverticulosis   . PAD (peripheral artery disease)   . Anal fissure   . Anxiety   . Chronic headaches   . Cataract   . Glaucoma   . Allergy    Current Outpatient Prescriptions  Medication Sig Dispense Refill  . amLODipine (NORVASC) 10 MG tablet Take 1 tablet (10 mg total) by mouth daily. 30 tablet 3  . cholecalciferol (VITAMIN D) 1000 UNITS tablet Take 1,000 Units by mouth daily.    . Cholecalciferol (VITAMIN D-3) 1000 UNITS CAPS Take 3,000 Units by mouth daily.     . clopidogrel (PLAVIX) 75 MG tablet Take 1 tablet (75 mg total) by mouth daily. 30 tablet 3  . cyclobenzaprine (FLEXERIL) 10 MG tablet Take 1 tablet (10 mg total) by mouth every 8 (eight) hours as needed for muscle spasms. 30 tablet 1  . doxepin (SINEQUAN) 100 MG capsule TAKE 1 CAPSULE BY MOUTH AT BEDTIME. 30 capsule 3  . levothyroxine (SYNTHROID, LEVOTHROID) 112 MCG tablet TAKE 1 TABLET (112  MCG TOTAL) BY MOUTH DAILY BEFORE BREAKFAST. 30 tablet 0  . lisinopril (PRINIVIL,ZESTRIL) 40 MG tablet Take 1 tablet (40 mg total) by mouth daily. 30 tablet 3  . pantoprazole (PROTONIX) 40 MG tablet Take 1 tablet (40 mg total) by mouth 2 (two) times daily. 60 tablet 3  . potassium gluconate 595 MG TABS tablet Take 595 mg by mouth daily.    . rosuvastatin (CRESTOR) 20 MG tablet Take 1 tablet (20 mg total) by mouth at bedtime. (Patient not taking: Reported on 01/25/2014) 30 tablet 12  . sertraline (ZOLOFT) 100 MG tablet Take 1 tablet (100 mg total) by mouth daily. 30 tablet 3  . Simethicone (GAS-X PO) Take 1 tablet by mouth every 6 (six) hours as needed (for flatulence).    . triamterene-hydrochlorothiazide (MAXZIDE-25) 37.5-25 MG per tablet Take 1 tablet by mouth daily. 30 tablet 3  . vitamin B-12 (CYANOCOBALAMIN) 100 MCG tablet Take 100 mcg by mouth daily.     No current facility-administered medications for this visit.   Family History  Problem Relation Age of Onset  . Peripheral vascular disease Mother   . Heart disease Mother   . Hypertension Mother   . AAA (abdominal aortic aneurysm) Father   . Hypertension Father   . Uterine cancer Sister     mets to  brain  . Liver cancer Brother   . Stomach cancer Maternal Aunt     x2  . Stomach cancer Paternal Aunt   . Heart disease Brother   . Kidney cancer Brother   . Anuerysm Brother     brain  . HIV Brother     x2   History   Social History  . Marital Status: Married    Spouse Name: N/A  . Number of Children: 1  . Years of Education: N/A   Social History Main Topics  . Smoking status: Current Every Day Smoker -- 1.00 packs/day    Types: Cigarettes  . Smokeless tobacco: Never Used  . Alcohol Use: No     Comment: occasional wine  . Drug Use: 1.00 per week     Comment: marijuana  . Sexual Activity: Not on file   Other Topics Concern  . None   Social History Narrative   Review of Systems: Review of Systems   Constitutional: Negative for fever and chills.  HENT: Negative for ear pain and tinnitus.   Respiratory: Positive for cough (occasional). Negative for shortness of breath.   Cardiovascular: Negative for chest pain.  Gastrointestinal: Negative for heartburn.  Neurological: Positive for headaches. Negative for sensory change, focal weakness and seizures.     Objective:  Physical Exam: Filed Vitals:   07/06/14 1031  BP: 158/83  Pulse: 80  Temp: 98 F (36.7 C)  TempSrc: Oral  Resp: 20  Height: 5' 1.25" (1.556 m)  Weight: 211 lb 8 oz (95.936 kg)  SpO2: 100%  Physical Exam  Constitutional: She is well-developed, well-nourished, and in no distress.  HENT:  Head: Normocephalic and atraumatic.  Eyes: Pupils are equal, round, and reactive to light.  Cardiovascular: Normal rate and regular rhythm.   Pulmonary/Chest: Effort normal and breath sounds normal.  Abdominal: Soft. Bowel sounds are normal.  Musculoskeletal:  Very tender hypertrophied cervical paraspinal musculature.  Neurological: No cranial nerve deficit.    Assessment & Plan:  Case discussed with Dr. Daryll Drown  Frequent headaches Her symptoms fit with a tension headahce, on exam she does have significant cervical paraspinal tenderness which is also suggestive.  I suspect this is worsened by stress from husband's illness then passing. - Will have her continue tylenol, she can also take flexeril for muscle spasm and apply heat to area. - She has yet to complete the CT head and neck that dr Redmond Pulling ordered for possible TIA (reports was not a good time given husband's illness) this may also provide additional information.  Hypothyroidism TSH was 7 at last visit 2 months ago, will recheck.    Medications Ordered Meds ordered this encounter  Medications  . cyclobenzaprine (FLEXERIL) 10 MG tablet    Sig: Take 1 tablet (10 mg total) by mouth every 8 (eight) hours as needed for muscle spasms.    Dispense:  30 tablet    Refill:   1   Other Orders Orders Placed This Encounter  Procedures  . TSH   Follow Up: Return in about 1 month (around 08/05/2014).

## 2014-07-10 NOTE — Progress Notes (Signed)
Internal Medicine Clinic Attending  Case discussed with Dr. Hoffman soon after the resident saw the patient.  We reviewed the resident's history and exam and pertinent patient test results.  I agree with the assessment, diagnosis, and plan of care documented in the resident's note. 

## 2014-07-12 ENCOUNTER — Ambulatory Visit (HOSPITAL_COMMUNITY): Payer: Commercial Managed Care - HMO

## 2014-07-14 ENCOUNTER — Other Ambulatory Visit: Payer: Self-pay | Admitting: Internal Medicine

## 2014-07-19 ENCOUNTER — Ambulatory Visit (HOSPITAL_COMMUNITY)
Admission: RE | Admit: 2014-07-19 | Discharge: 2014-07-19 | Disposition: A | Discharge status: Home / Self Care | Payer: Commercial Managed Care - HMO | Source: Ambulatory Visit | Attending: Internal Medicine | Admitting: Internal Medicine

## 2014-07-19 MED ORDER — SODIUM CHLORIDE 0.9 % IV SOLN: Freq: Two times a day (BID) | INTRAVENOUS | Status: AC

## 2014-08-07 ENCOUNTER — Ambulatory Visit (INDEPENDENT_AMBULATORY_CARE_PROVIDER_SITE_OTHER): Payer: Commercial Managed Care - HMO | Admitting: Internal Medicine

## 2014-08-07 VITALS — BP 130/80 | HR 79 | Temp 98.2°F | Ht 61.0 in | Wt 217.7 lb

## 2014-08-07 DIAGNOSIS — M6289 Other specified disorders of muscle: Secondary | ICD-10-CM

## 2014-08-07 DIAGNOSIS — E039 Hypothyroidism, unspecified: Secondary | ICD-10-CM | POA: Diagnosis not present

## 2014-08-07 DIAGNOSIS — R51 Headache: Secondary | ICD-10-CM | POA: Diagnosis not present

## 2014-08-07 DIAGNOSIS — F332 Major depressive disorder, recurrent severe without psychotic features: Secondary | ICD-10-CM | POA: Diagnosis not present

## 2014-08-07 DIAGNOSIS — I739 Peripheral vascular disease, unspecified: Secondary | ICD-10-CM | POA: Diagnosis not present

## 2014-08-07 DIAGNOSIS — R519 Headache, unspecified: Secondary | ICD-10-CM

## 2014-08-07 DIAGNOSIS — R6889 Other general symptoms and signs: Secondary | ICD-10-CM | POA: Diagnosis not present

## 2014-08-07 DIAGNOSIS — F17200 Nicotine dependence, unspecified, uncomplicated: Secondary | ICD-10-CM

## 2014-08-07 DIAGNOSIS — R531 Weakness: Secondary | ICD-10-CM

## 2014-08-07 DIAGNOSIS — I10 Essential (primary) hypertension: Secondary | ICD-10-CM

## 2014-08-07 DIAGNOSIS — F17210 Nicotine dependence, cigarettes, uncomplicated: Secondary | ICD-10-CM

## 2014-08-07 LAB — TSH: TSH: 28.274 u[IU]/mL — ABNORMAL HIGH (ref 0.350–4.500)

## 2014-08-07 MED ORDER — LEVOTHYROXINE SODIUM 112 MCG PO TABS: ORAL_TABLET | ORAL | Status: DC

## 2014-08-07 MED ORDER — AMLODIPINE BESYLATE 10 MG PO TABS: 10.0000 mg | ORAL_TABLET | Freq: Every day | ORAL | Status: DC

## 2014-08-07 MED ORDER — SERTRALINE HCL 100 MG PO TABS: 100.0000 mg | ORAL_TABLET | Freq: Every day | ORAL | Status: DC

## 2014-08-07 MED ORDER — TRIAMTERENE-HCTZ 37.5-25 MG PO TABS: 1.0000 | ORAL_TABLET | Freq: Every day | ORAL | Status: DC

## 2014-08-07 MED ORDER — CLOPIDOGREL BISULFATE 75 MG PO TABS: 75.0000 mg | ORAL_TABLET | Freq: Every day | ORAL | Status: DC

## 2014-08-07 MED ORDER — LISINOPRIL 40 MG PO TABS: 40.0000 mg | ORAL_TABLET | Freq: Every day | ORAL | Status: DC

## 2014-08-07 NOTE — Progress Notes (Signed)
Subjective:    Patient ID: [name], female    DOB: November 20, 1950, 64 y.o.   MRN: [id number]  HPI Comments: Ms. Abrigo is a 64 year old woman with PMH as below here for follow-up of chronic conditions.  She is still experiencing headaches.  Please see problem based charting for assessment and plan.    Past Medical History  Diagnosis Date  . Hypothyroidism   . Hypertension   . TIA (transient ischemic attack)   . PUD (peptic ulcer disease)   . HLD (hyperlipidemia)   . Depression   . GERD (gastroesophageal reflux disease)   . Diverticulosis   . PAD (peripheral artery disease)   . Anal fissure   . Anxiety   . Chronic headaches   . Cataract   . Glaucoma   . Allergy    Current Outpatient Prescriptions on File Prior to Visit  Medication Sig Dispense Refill  . amLODipine (NORVASC) 10 MG tablet Take 1 tablet (10 mg total) by mouth daily. 30 tablet 3  . cholecalciferol (VITAMIN D) 1000 UNITS tablet Take 1,000 Units by mouth daily.    . Cholecalciferol (VITAMIN D-3) 1000 UNITS CAPS Take 3,000 Units by mouth daily.     . clopidogrel (PLAVIX) 75 MG tablet Take 1 tablet (75 mg total) by mouth daily. 30 tablet 3  . cyclobenzaprine (FLEXERIL) 10 MG tablet Take 1 tablet (10 mg total) by mouth every 8 (eight) hours as needed for muscle spasms. 30 tablet 1  . doxepin (SINEQUAN) 100 MG capsule TAKE 1 CAPSULE BY MOUTH AT BEDTIME. 30 capsule 3  . levothyroxine (SYNTHROID, LEVOTHROID) 112 MCG tablet TAKE 1 TABLET BY MOUTH DAILY BEFORE BREAKFAST 30 tablet 0  . lisinopril (PRINIVIL,ZESTRIL) 40 MG tablet Take 1 tablet (40 mg total) by mouth daily. 30 tablet 3  . pantoprazole (PROTONIX) 40 MG tablet Take 1 tablet (40 mg total) by mouth 2 (two) times daily. 60 tablet 3  . potassium gluconate 595 MG TABS tablet Take 595 mg by mouth daily.    . rosuvastatin (CRESTOR) 20 MG tablet Take 1 tablet (20 mg total) by mouth at bedtime. (Patient not taking: Reported on 01/25/2014) 30 tablet 12  . sertraline  (ZOLOFT) 100 MG tablet Take 1 tablet (100 mg total) by mouth daily. 30 tablet 3  . Simethicone (GAS-X PO) Take 1 tablet by mouth every 6 (six) hours as needed (for flatulence).    . triamterene-hydrochlorothiazide (MAXZIDE-25) 37.5-25 MG per tablet Take 1 tablet by mouth daily. 30 tablet 3  . vitamin B-12 (CYANOCOBALAMIN) 100 MCG tablet Take 100 mcg by mouth daily.     No current facility-administered medications on file prior to visit.    Review of Systems  Constitutional: Positive for appetite change. Negative for fever and chills.  Eyes: Positive for photophobia.  Respiratory: Negative for shortness of breath.   Cardiovascular: Negative for chest pain and palpitations.  Gastrointestinal: Positive for nausea. Negative for diarrhea and constipation.  Genitourinary: Negative for difficulty urinating.  Neurological: Positive for weakness and headaches. Negative for syncope.       Two months ago - left leg weakness x 20 minutes, no visual change or facial droop       Filed Vitals:   08/07/14 1326 08/07/14 1453  BP: 152/62 130/80  Pulse: 86 79  Temp: 98.2 F (36.8 C)   TempSrc: Oral   Height: 5\' 1"  (1.549 m)   Weight: 217 lb 11.2 oz (98.748 kg)   SpO2: 97%  Objective:   Physical Exam  Constitutional: She is oriented to person, place, and time. She appears well-developed. No distress.  HENT:  Head: Normocephalic and atraumatic.  Mouth/Throat: Oropharynx is clear and moist. No oropharyngeal exudate.  Eyes: EOM are normal. Pupils are equal, round, and reactive to light.  Neck: Neck supple.  Cardiovascular: Normal rate, regular rhythm and normal heart sounds.  Exam reveals no gallop and no friction rub.   No murmur heard. Pulmonary/Chest: Effort normal and breath sounds normal. No respiratory distress. She has no wheezes. She has no rales.  Abdominal: Soft. Bowel sounds are normal. She exhibits no distension and no mass. There is no tenderness. There is no rebound and no  guarding.  Musculoskeletal: Normal range of motion. She exhibits no edema or tenderness.  Neurological: She is alert and oriented to person, place, and time. No cranial nerve deficit.  Sensation grossly intact and 5/5 MMS upper and lower extremities  Skin: Skin is warm. She is not diaphoretic.  Psychiatric: She has a normal mood and affect. Her behavior is normal.  Vitals reviewed.         Assessment & Plan:  Please see problem based charting for assessment and plan.

## 2014-08-07 NOTE — Patient Instructions (Addendum)
1. I am referring you to the headache clinic and to Dr. Kellie Simmering (for leg pain).  Please continue Tylenol and flexeril as needed for headache.  Please be sure to get the head CT because you have been having weakness from time to time.  I will call you if there is a problem with your labs.  Please come back to see me in 1 month.    2. Please take all medications as prescribed.    3. If you have worsening of your symptoms or new symptoms arise, please call the clinic ([phone]), or go to the ER immediately if symptoms are severe.

## 2014-08-08 NOTE — Assessment & Plan Note (Addendum)
She is experiencing grief since her husband died last month.  She says she is more withdrawn but no SI.  She says she never has thoughts of harming herself.  She is going to grief counseling at [facility] of [place].  She is appropriately interactive on exam.  She did not follow-up with Dr. France Ravens last month due to husbands death a few days prior to the scheduled appointment.  She tells me prior to her husband's death she was using Zoloft prn (for anxiety).  She says another doctor told her to take it as needed.  Since her husband died she has felt the need to use it daily.  I explained that this was a med that was meant for daily use and she could have side effects if it was stopped abruptly.  She is using Doxepin nightly but feels it is not working as well any more. - advised appropriate use of depression medications - daily not prn - she would like referral to a different psychiatrist, referral placed; I have asked her to continue her medications until she is seen by psych and then appropriate changes and taper can be recommended - continue grief counseling  - she will follow-up with me in 1 month

## 2014-08-08 NOTE — Assessment & Plan Note (Signed)
BP Readings from Last 3 Encounters:  08/07/14 130/80  07/06/14 158/83  04/19/14 147/88    Lab Results  Component Value Date   NA 143 04/19/2014   K 4.1 04/19/2014   CREATININE 1.31* 04/19/2014    Assessment: Blood pressure control:  well controlled Progress toward BP goal:   at goal Comments: Reports compliance.  Would like 90 day supply instead of 30 day.  Plan: Medications:  continue current medications: amlodipine 10mg  daily, lisinopril 40mg  daily, triamterene-HCTZ 37.5/25mg  daily Educational resources provided:   Self management tools provided:   Other plans: 90 day refills sent

## 2014-08-08 NOTE — Assessment & Plan Note (Addendum)
Her intermittent claudication has returned.  She says she can walk about 1 block before symptoms start.  She reports compliance with Plavix.  She stopped statin because she thought it may have been contributing to her headache (yet headache persists).  She has increased her smoking since her husband was ill and passed away last month.  Pulses difficult to palpate but feet warm, no ulcers. - refer back to Vascular  - counseled on smoking cessation - continue Plavix, RESUME statin

## 2014-08-08 NOTE — Assessment & Plan Note (Addendum)
She had another episode of left sided weakness about two months ago (left lower extremity).  She never got head imaging I ordered.  I am concerned she is having recurrent TIAs.  Neuro exam is normal today.  Or perhaps this is migraine aura (concurrent headaches). - she will get imaging

## 2014-08-08 NOTE — Assessment & Plan Note (Signed)
TSH today 

## 2014-08-08 NOTE — Assessment & Plan Note (Signed)
Ms. Coughlin is still experiencing headaches.  She describes hat-band distribution that has worsened since she was seen 1 month ago.  No change in headache with valsalva or position change.  + photophobia and nausea.  She takes flexeril when her neck muscles feel tight but otherwise using Tylenol as needed for the headache pain.  She has had little relief from these meds.  She says the headaches feels similar to migraines she had when she was younger.  She cannot remember the names of migraine meds she took at that time.  Normal neuro exam.  Her temples are not tender to palpation.  Neck musculature is tight.  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  She has also been using Zoloft inconsistently (she says she was told by previous doctor to use it prn when she was anxious), she has increased to daily since husband passed away last month.  She uses doxepin nightly.  Unclear the role these meds may be playing since both have ADRs of headache.  However, she has been on the medications prior to the start of her headaches. - continue prn Tylenol, flexeril (advised that flexeril can cause drowsiness); I cannot prescribe triptan due to her significant PAD. - refer to headache clinic - advised appropriate (daily) use of depression/anxiety medications  - follow-up in 1 month

## 2014-08-08 NOTE — Assessment & Plan Note (Signed)
  Assessment: Progress toward smoking cessation:   worsened Barriers to progress toward smoking cessation:    increased stress, recent death of husband Comments: smoking 1.5 ppd  Plan: Instruction/counseling given:  I counseled patient on the dangers of tobacco use, advised patient to stop smoking, and reviewed strategies to maximize success. Educational resources provided:    Self management tools provided:    Medications to assist with smoking cessation:   Patient agreed to the following self-care plans for smoking cessation:    Other plans: follow-up in 1 month

## 2014-08-09 ENCOUNTER — Telehealth: Payer: Self-pay | Admitting: Licensed Clinical Social Worker

## 2014-08-09 NOTE — Telephone Encounter (Signed)
Ms. Leisner was referred to CSW for assistance with medication procurement.  Pt has been without medication for several days due to inability to afford her copayment.  Ms. Erby informed PCP, financially she will not be able to afford her medications until Aug. 4th.  CSW placed call to [facility] to obtain pt's copayment for medications, total $8.95 for amlodipine, clopidogrel, levothyroxin, lisinopril, sertraline, and mazide.  CSW discussed with financial counselor and funds available through donation fund.  Pt eligible.  Ms. Fess notified to obtain funds today from Development worker, community, Ms. Deb Hill.  Pt also to receive items from pantry.  Pt hopeful she will be able to pick funds up today at 4:30, family gets off work at [employer].  Ms. Thumma was referred to [facility] for obtaining new psychiatrist.  Pt was active with Dr. Casimiro Needle but prefers to have a new provider.  CSW discussed with Ms. Cerveny if she preferred new office other than [place] or if pt preferred another provide within [facility].  Ms. Zamorano states she would like to stay with [facility].  CSW inquired with Butch Penny at [facility] regarding the options available.  Ms. Gains will need to meet/schedule with Dr. Casimiro Needle to discuss referral to new provider.  CSW notified Ms. Faivre, pt aware and states she is able to schedule her own appointment with Dr. Casimiro Needle to discuss referral to different provider.   Pt denies add'l needs at this time aware; CSW is available to assist as needed.

## 2014-08-11 NOTE — Progress Notes (Signed)
Internal Medicine Clinic Attending  Case discussed with Dr. Wilson soon after the resident saw the patient.  We reviewed the resident's history and exam and pertinent patient test results.  I agree with the assessment, diagnosis, and plan of care documented in the resident's note.  

## 2014-08-18 ENCOUNTER — Other Ambulatory Visit: Payer: Self-pay

## 2014-09-05 ENCOUNTER — Other Ambulatory Visit: Payer: Self-pay | Admitting: *Deleted

## 2014-09-05 DIAGNOSIS — I739 Peripheral vascular disease, unspecified: Secondary | ICD-10-CM

## 2014-09-06 ENCOUNTER — Encounter: Payer: Self-pay | Admitting: Internal Medicine

## 2014-09-15 ENCOUNTER — Other Ambulatory Visit: Payer: Self-pay | Admitting: Internal Medicine

## 2014-09-15 DIAGNOSIS — E034 Atrophy of thyroid (acquired): Secondary | ICD-10-CM

## 2014-09-15 NOTE — Telephone Encounter (Signed)
Talked with pt - has appt with Dr Redmond Pulling 09/20/14.

## 2014-09-15 NOTE — Telephone Encounter (Signed)
Pt called requesting levothyroxine to be filled.

## 2014-09-15 NOTE — Telephone Encounter (Signed)
Can you please call her and have her come in for a TSH?

## 2014-09-19 ENCOUNTER — Ambulatory Visit (INDEPENDENT_AMBULATORY_CARE_PROVIDER_SITE_OTHER): Payer: Commercial Managed Care - HMO | Admitting: Neurology

## 2014-09-19 ENCOUNTER — Encounter: Payer: Self-pay | Admitting: Neurology

## 2014-09-19 VITALS — BP 141/86 | HR 91 | Ht 61.75 in | Wt 213.0 lb

## 2014-09-19 DIAGNOSIS — Z8249 Family history of ischemic heart disease and other diseases of the circulatory system: Secondary | ICD-10-CM

## 2014-09-19 DIAGNOSIS — H538 Other visual disturbances: Secondary | ICD-10-CM

## 2014-09-19 DIAGNOSIS — H93A2 Pulsatile tinnitus, left ear: Secondary | ICD-10-CM

## 2014-09-19 DIAGNOSIS — R410 Disorientation, unspecified: Secondary | ICD-10-CM

## 2014-09-19 DIAGNOSIS — H9312 Tinnitus, left ear: Secondary | ICD-10-CM

## 2014-09-19 DIAGNOSIS — G4719 Other hypersomnia: Secondary | ICD-10-CM | POA: Diagnosis not present

## 2014-09-19 DIAGNOSIS — R6889 Other general symptoms and signs: Secondary | ICD-10-CM | POA: Diagnosis not present

## 2014-09-19 DIAGNOSIS — R51 Headache: Secondary | ICD-10-CM | POA: Diagnosis not present

## 2014-09-19 DIAGNOSIS — R519 Headache, unspecified: Secondary | ICD-10-CM | POA: Insufficient documentation

## 2014-09-19 HISTORY — DX: Other visual disturbances: H53.8

## 2014-09-19 MED ORDER — CYCLOBENZAPRINE HCL 10 MG PO TABS: 10.0000 mg | ORAL_TABLET | Freq: Three times a day (TID) | ORAL | Status: DC | PRN

## 2014-09-19 NOTE — Progress Notes (Signed)
[facility]    Provider:  Dr Jaynee Eagles Referring Provider: Francesca Oman, DO Primary Care Physician:  Duwaine Maxin, DO  CC:  headache  HPI:  [name] is a 64 y.o. female here as a referral from Dr. Redmond Pulling for headache. She has a past medical history of hypertension, hyperlipidemia, migraine, depression, anxiety.  She feels like her head is clogged up. She has a rushing noise in her head. The pain radiates through the eyes and through the vertex of the head. Not in the back. Pressure headache. If she holds her head a certain way the sound starts, it is pulsatile in the ear  . She hears pulses it in the left ear. And has ringing in the left ear as well. Headaches started a few years ago, worse in the last year. She has a remote hstory of migraines which stopped in 1972. This headache is different. She does not endorse light or sound sensitivity, not migrainous as she remembers them. Aneurysms run in the family. Brother had severe headaches, so did father. They both died from aneurysms. Aunts with aneurysm with kidney disease. Headaches are every day, mostly in the afternoon and evenings. They start around noon. There are times when she goes completely numb, she can't hear and she trembles like she is going to pass out, she in conscious but she can't move. She can see but she feels confused. It lasts for 30 minutes. She can't understand what is going on around her during these times. No other new focal neurologic complaints.  She is excessively tired during the day. She has nocturnal headaches. She has been told she breathes heavily/snores at night.  She is under a lot of stress.  She has gained weight. Max 9-10/10 in severity for the headaches. Severe.    Reviewed notes, labs and imaging from outside physicians, which showed: MRI was performed in 2012 at the [facility]. Indication was headache. There is no evidence of Chiari malformation, expected flow voids in  the major intracranial vessels, an empty sella is present which is a sign nonspecific finding, there are scattered very small foci of increased T2 weighted signal within the subcortical and deep white matter which are nonspecific. 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 There is no evidence of extra-axial collection, mass effect or midline shift. The ventricles and sulci are normal in size. Evidence of previous cataract surgery in the left globe.  Reviewed records going back to 2014 describing frequent headaches. Flexeril worked in the past for her symptoms. In 2015 patient reported headache for 2 months burning sharp bilateral hatband distribution which eased up during sleep. (This was different than the headache she is describing today) stress worsens headache, Flexeril helps the headache. It was thought that hypertension may be playing a role in the headache as well a stress.  Review of Systems: Patient complains of symptoms per HPI as well as the following symptoms: Fatigue, blurred vision, swelling of legs, ringing in ears, spinning sensation, trouble swallowing, aching muscles, confusion, headaches, slurred speech, difficulty swallowing, dizziness, seizure, tremor, sleepiness, restless legs, anxiety, disinterest in activities, racing thoughts. Pertinent negatives per HPI. All others negative.   Social History   Social History  . Marital Status: Widowed    Spouse Name: N/A  . Number of Children: 1  . Years of Education: 12   Occupational History  . Disabled     Social History Main Topics  . Smoking status: Current  Every Day Smoker -- 1.00 packs/day    Types: Cigarettes  . Smokeless tobacco: Never Used  . Alcohol Use: No     Comment: occasional wine  . Drug Use: 1.00 per week     Comment: marijuana  . Sexual Activity: Not on file   Other Topics Concern  . Not on file   Social History Narrative   Live at home alone.   Caffeine use: Drinks coffee once/day     Soda- 2-3 times per day       Family History  Problem Relation Age of Onset  . Peripheral vascular disease Mother   . Heart disease Mother   . Hypertension Mother   . AAA (abdominal aortic aneurysm) Father   . Hypertension Father   . Uterine cancer Sister     mets to brain  . Liver cancer Brother   . Stomach cancer Maternal Aunt     x2  . Stomach cancer Paternal Aunt   . Heart disease Brother   . Kidney cancer Brother   . Anuerysm Brother     brain  . HIV Brother     x2  . Aneurysm Father     Past Medical History  Diagnosis Date  . Hypothyroidism   . Hypertension   . TIA (transient ischemic attack)   . PUD (peptic ulcer disease)   . HLD (hyperlipidemia)   . Depression   . GERD (gastroesophageal reflux disease)   . Diverticulosis   . PAD (peripheral artery disease)   . Anal fissure   . Anxiety   . Chronic headaches   . Cataract   . Glaucoma   . Allergy     Past Surgical History  Procedure Laterality Date  . Total thyroidectomy    . Aortogram    . Iliac artery angioplasty and stenting Left 12/2012  . Sfa stent Left 2012    at [facility]  . Abdominal aortagram N/A 01/20/2013    Procedure: ABDOMINAL Maxcine Ham;  Surgeon: Conrad [place], MD;  Location: [facility] CATH LAB;  Service: Cardiovascular;  Laterality: N/A;  . Anal fissure repair    . Cataract extraction Left   . Breast reduction surgery    . Stomach surgery      Current Outpatient Prescriptions  Medication Sig Dispense Refill  . amLODipine (NORVASC) 10 MG tablet Take 1 tablet (10 mg total) by mouth daily. 90 tablet 1  . clopidogrel (PLAVIX) 75 MG tablet Take 1 tablet (75 mg total) by mouth daily. 90 tablet 1  . cyclobenzaprine (FLEXERIL) 10 MG tablet Take 1 tablet (10 mg total) by mouth every 8 (eight) hours as needed for muscle spasms. 30 tablet 1  . doxepin (SINEQUAN) 100 MG capsule TAKE 1 CAPSULE BY MOUTH AT BEDTIME. 30 capsule 3  . levothyroxine (SYNTHROID, LEVOTHROID) 112 MCG  tablet TAKE 1 TABLET BY MOUTH DAILY BEFORE BREAKFAST 30 tablet 0  . lisinopril (PRINIVIL,ZESTRIL) 40 MG tablet Take 1 tablet (40 mg total) by mouth daily. 90 tablet 1  . pantoprazole (PROTONIX) 40 MG tablet Take 1 tablet (40 mg total) by mouth 2 (two) times daily. 60 tablet 3  . potassium gluconate 595 MG TABS tablet Take 595 mg by mouth daily.    . rosuvastatin (CRESTOR) 20 MG tablet Take 1 tablet (20 mg total) by mouth at bedtime. 30 tablet 12  . sertraline (ZOLOFT) 100 MG tablet Take 1 tablet (100 mg total) by mouth daily. 90 tablet 1  . Simethicone (GAS-X PO)  Take 1 tablet by mouth every 6 (six) hours as needed (for flatulence).    . triamterene-hydrochlorothiazide (MAXZIDE-25) 37.5-25 MG per tablet Take 1 tablet by mouth daily. 90 tablet 1   No current facility-administered medications for this visit.    Allergies as of 09/19/2014 - Review Complete 09/19/2014  Allergen Reaction Noted  . Asa [aspirin] Nausea And Vomiting 12/13/2012  . Penicillins Hives and Itching 12/13/2012    Vitals: BP 141/86 mmHg  Pulse 91  Ht 5' 1.75" (1.568 m)  Wt 213 lb (96.616 kg)  BMI 39.30 kg/m2 Last Weight:  Wt Readings from Last 1 Encounters:  09/19/14 213 lb (96.616 kg)   Last Height:   Ht Readings from Last 1 Encounters:  09/19/14 5' 1.75" (1.568 m)    Physical exam: Exam: Gen: NAD, conversant, well nourised, obese, well groomed                     CV: RRR, no MRG. No Carotid Bruits. No peripheral edema, warm, nontender Eyes: Conjunctivae clear without exudates or hemorrhage  Neuro: Detailed Neurologic Exam  Speech:    Speech is normal; fluent and spontaneous with normal comprehension.  Cognition:    The patient is oriented to person, place, and time;     recent and remote memory intact;     language fluent;     normal attention, concentration,     fund of knowledge Cranial Nerves:    The pupils are equal, round, and reactive to light. Attempted could not visualize fundi. OD  20/200, OS 20/100, bilateral 20/70 without correction. Visual fields are full to finger confrontation. Mild proptosis bilaterally. Extraocular movements are intact. Trigeminal sensation is intact and the muscles of mastication are normal. The face is symmetric. The palate elevates in the midline. Hearing intact. Voice is normal. Shoulder shrug is normal. The tongue has normal motion without fasciculations.   Coordination:    No dysmetria noted  Gait:    Slightly wide base, not ataxic  Motor Observation:    No asymmetry, no atrophy, and no involuntary movements noted. Tone:    Normal muscle tone.    Posture:    Posture is normal. normal erect    Strength:    Strength is V/V in the upper and lower limbs.      Sensation: intact to LT     Reflex Exam:  DTR's:    Deep tendon reflexes in the upper and lower extremities are symmetrical bilaterally.   Toes:    The toes are downgoing bilaterally.   Clonus:    Clonus is absent.      Assessment/Plan:   64 y.o. female here as a referral from Dr. Redmond Pulling for headache. She has a past medical history of hypertension, hyperlipidemia, migraine, depression, anxiety. Headaches are different than her previous migraines, she has pain radiating through the eyes and through the vertex of the head with pressure, she also complains of pulsatile tinnitus in the left ear that is positional. She has an extensive family history of aneurysms and possibly aneurysm ruptures. Will order an MRI of the brain with and without contrast due to new onset headaches after the age of 68 as well as an MRA of the head to evaluate for aneurysms given family history and pulsatile tinnitus in the left ear. She is also having episodes of confusion where she feels she is conscious but she can't move, lasts for 30 minutes, she can't understand what is going on around her at these  times. This is unlikely seizure activity but we'll perform an EEG to evaluate for epileptiform activity.  Will order an ESR and CRP and a CMP. Given patient's obesity, snoring, nocturnal headaches will order a sleep study to evaluate for obstructive sleep apnea and hypoventilation obesity syndrome.   Sarina Ill, MD  [facility] [address] [place], [place] [address]  Phone [phone] Fax [phone]

## 2014-09-19 NOTE — Patient Instructions (Signed)
Overall you are doing fairly well but I do want to suggest a few things today:   Remember to drink plenty of fluid, eat healthy meals and do not skip any meals. Try to eat protein with a every meal and eat a healthy snack such as fruit or nuts in between meals. Try to keep a regular sleep-wake schedule and try to exercise daily, particularly in the form of walking, 20-30 minutes a day, if you can.   As far as your medications are concerned, I would like to suggest: Flexeril as needed for muscle spasm and headache  As far as diagnostic testing: MRI of the brain, labs, EEG  I would like to see you back in 3 months, sooner if we need to. Please call us with any interim questions, concerns, problems, updates or refill requests.   Please also call us for any test results so we can go over those with you on the phone.  My clinical assistant and will answer any of your questions and relay your messages to me and also relay most of my messages to you.   Our phone number is [phone]. We also have an after hours call service for urgent matters and there is a physician on-call for urgent questions. For any emergencies you know to call 911 or go to the nearest emergency room

## 2014-09-20 ENCOUNTER — Encounter: Payer: Self-pay | Admitting: Internal Medicine

## 2014-09-20 ENCOUNTER — Ambulatory Visit (INDEPENDENT_AMBULATORY_CARE_PROVIDER_SITE_OTHER): Payer: Commercial Managed Care - HMO | Admitting: Internal Medicine

## 2014-09-20 VITALS — BP 137/74 | HR 86 | Temp 98.6°F | Ht 61.9 in | Wt 215.0 lb

## 2014-09-20 DIAGNOSIS — R51 Headache: Secondary | ICD-10-CM | POA: Diagnosis not present

## 2014-09-20 DIAGNOSIS — N189 Chronic kidney disease, unspecified: Secondary | ICD-10-CM

## 2014-09-20 DIAGNOSIS — E038 Other specified hypothyroidism: Secondary | ICD-10-CM | POA: Diagnosis not present

## 2014-09-20 DIAGNOSIS — I739 Peripheral vascular disease, unspecified: Secondary | ICD-10-CM | POA: Diagnosis not present

## 2014-09-20 DIAGNOSIS — I129 Hypertensive chronic kidney disease with stage 1 through stage 4 chronic kidney disease, or unspecified chronic kidney disease: Secondary | ICD-10-CM

## 2014-09-20 DIAGNOSIS — R6889 Other general symptoms and signs: Secondary | ICD-10-CM | POA: Diagnosis not present

## 2014-09-20 DIAGNOSIS — F332 Major depressive disorder, recurrent severe without psychotic features: Secondary | ICD-10-CM | POA: Diagnosis not present

## 2014-09-20 DIAGNOSIS — E039 Hypothyroidism, unspecified: Secondary | ICD-10-CM | POA: Diagnosis not present

## 2014-09-20 DIAGNOSIS — E034 Atrophy of thyroid (acquired): Secondary | ICD-10-CM | POA: Diagnosis not present

## 2014-09-20 DIAGNOSIS — F17200 Nicotine dependence, unspecified, uncomplicated: Secondary | ICD-10-CM

## 2014-09-20 DIAGNOSIS — I10 Essential (primary) hypertension: Secondary | ICD-10-CM

## 2014-09-20 DIAGNOSIS — R519 Headache, unspecified: Secondary | ICD-10-CM

## 2014-09-20 LAB — COMPREHENSIVE METABOLIC PANEL
ALT: 14 IU/L (ref 0–32)
AST: 15 IU/L (ref 0–40)
Albumin/Globulin Ratio: 1.5 (ref 1.1–2.5)
Albumin: 4.3 g/dL (ref 3.6–4.8)
Alkaline Phosphatase: 109 IU/L (ref 39–117)
BUN/Creatinine Ratio: 9 — ABNORMAL LOW (ref 11–26)
BUN: 14 mg/dL (ref 8–27)
Bilirubin Total: 0.2 mg/dL (ref 0.0–1.2)
CALCIUM: 9.6 mg/dL (ref 8.7–10.3)
CO2: 26 mmol/L (ref 18–29)
Chloride: 100 mmol/L (ref 97–108)
Creatinine, Ser: 1.55 mg/dL — ABNORMAL HIGH (ref 0.57–1.00)
GFR, EST AFRICAN AMERICAN: 40 mL/min/{1.73_m2} — AB (ref >59)
GFR, EST NON AFRICAN AMERICAN: 35 mL/min/{1.73_m2} — AB (ref >59)
GLUCOSE: 110 mg/dL — AB (ref 65–99)
Globulin, Total: 2.8 g/dL (ref 1.5–4.5)
POTASSIUM: 4.7 mmol/L (ref 3.5–5.2)
Sodium: 142 mmol/L (ref 134–144)
TOTAL PROTEIN: 7.1 g/dL (ref 6.0–8.5)

## 2014-09-20 LAB — SEDIMENTATION RATE: SED RATE: 17 mm/h (ref 0–40)

## 2014-09-20 LAB — C-REACTIVE PROTEIN: CRP: 8.1 mg/L — AB (ref 0.0–4.9)

## 2014-09-20 NOTE — Patient Instructions (Addendum)
1. I think using a lot of pain medication may be making your headache worse.  Please stop use of Tylenol for headaches for now and see if you have improvement in headache.  In the meantime, you should be contacted soon about scheduling MRI and MRA that Dr. Jaynee Eagles ordered.     2. Please take all medications as prescribed.    3. If you have worsening of your symptoms or new symptoms arise, please call the clinic [phone]), or go to the ER immediately if symptoms are severe.   Please come back to see me in 1 month or sooner if you need to.

## 2014-09-20 NOTE — Progress Notes (Signed)
Internal Medicine Clinic Attending  Case discussed with Dr. Wilson soon after the resident saw the patient.  We reviewed the resident's history and exam and pertinent patient test results.  I agree with the assessment, diagnosis, and plan of care documented in the resident's note.  

## 2014-09-20 NOTE — Assessment & Plan Note (Addendum)
She went to a counseling session at [facility] and appreciated it but says she finds it difficult to discuss her late husband or be around others who are experiencing loss at this time.  She would like to reconnect with psychiatry and/or therapist but does not want to return for grief counseling at this time.  She does experience feelings of guilt, worthlessness, sleep problems but denies SI.  She has support system in family, friends.  She notices benefit from taking Zoloft every day, it is helping a lot with anxiety.  She was contacted by CSW regarding referral to new psychiatrist (see 08/09/14 telephone note).  Patient was advised to contact Dr. Casimiro Needle in order for referral to new provider.  She says she appreciates coming to see me and would prefer to come monthly until issues are more stable. - ask patient to see Dr. Casimiro Needle for referral - continue Zoloft since she is finding benefit since she started taking it daily (instead of prn as she was doing before) - follow-up with me in 1 month

## 2014-09-20 NOTE — Progress Notes (Signed)
Subjective:    Patient ID: [name], female    DOB: [birth year], 64 y.o.   MRN: [id number]  HPI Comments: [name] is a 64 year old woman with PMH as below here for follow-up of headache and depression.  Please see problem based charting for assessment and plan.    Past Medical History  Diagnosis Date  . Hypothyroidism   . Hypertension   . TIA (transient ischemic attack)   . PUD (peptic ulcer disease)   . HLD (hyperlipidemia)   . Depression   . GERD (gastroesophageal reflux disease)   . Diverticulosis   . PAD (peripheral artery disease)   . Anal fissure   . Anxiety   . Chronic headaches   . Cataract   . Glaucoma   . Allergy    Current Outpatient Prescriptions on File Prior to Visit  Medication Sig Dispense Refill  . amLODipine (NORVASC) 10 MG tablet Take 1 tablet (10 mg total) by mouth daily. 90 tablet 1  . clopidogrel (PLAVIX) 75 MG tablet Take 1 tablet (75 mg total) by mouth daily. 90 tablet 1  . doxepin (SINEQUAN) 100 MG capsule TAKE 1 CAPSULE BY MOUTH AT BEDTIME. 30 capsule 3  . levothyroxine (SYNTHROID, LEVOTHROID) 112 MCG tablet TAKE 1 TABLET BY MOUTH DAILY BEFORE BREAKFAST 30 tablet 0  . lisinopril (PRINIVIL,ZESTRIL) 40 MG tablet Take 1 tablet (40 mg total) by mouth daily. 90 tablet 1  . pantoprazole (PROTONIX) 40 MG tablet Take 1 tablet (40 mg total) by mouth 2 (two) times daily. 60 tablet 3  . potassium gluconate 595 MG TABS tablet Take 595 mg by mouth daily.    . sertraline (ZOLOFT) 100 MG tablet Take 1 tablet (100 mg total) by mouth daily. 90 tablet 1  . triamterene-hydrochlorothiazide (MAXZIDE-25) 37.5-25 MG per tablet Take 1 tablet by mouth daily. 90 tablet 1  . cyclobenzaprine (FLEXERIL) 10 MG tablet Take 1 tablet (10 mg total) by mouth every 8 (eight) hours as needed for muscle spasms. 60 tablet 6  . rosuvastatin (CRESTOR) 20 MG tablet Take 1 tablet (20 mg total) by mouth at bedtime. 30 tablet 12  . Simethicone (GAS-X PO) Take 1 tablet by mouth every 6  (six) hours as needed (for flatulence).     No current facility-administered medications on file prior to visit.    Review of Systems  Constitutional: Negative for fever, chills and appetite change.       Appetite still not great.  Eyes: Positive for visual disturbance.  Respiratory: Negative for shortness of breath.   Cardiovascular: Positive for leg swelling. Negative for chest pain.  Gastrointestinal: Positive for nausea. Negative for vomiting.  Neurological: Negative for syncope.       Filed Vitals:   09/20/14 1420  BP: 137/74  Pulse: 86  Temp: 98.6 F (37 C)  TempSrc: Oral  Height: 5' 1.9" (1.572 m)  Weight: 215 lb (97.523 kg)  SpO2: 100%   Objective:   Physical Exam  Constitutional: She is oriented to person, place, and time. She appears well-developed. No distress.  HENT:  Head: Normocephalic and atraumatic.  Mouth/Throat: Oropharynx is clear and moist. No oropharyngeal exudate.  Eyes: EOM are normal. Pupils are equal, round, and reactive to light.  Neck: Neck supple.  Cardiovascular: Normal rate, regular rhythm and normal heart sounds.   Pulmonary/Chest: Effort normal and breath sounds normal. No respiratory distress. She has no wheezes. She has no rales.  Abdominal: Soft. Bowel sounds are normal. She exhibits no distension.  There is no tenderness. There is no rebound.  Musculoskeletal: Normal range of motion. She exhibits no edema or tenderness.  Neurological: She is alert and oriented to person, place, and time. No cranial nerve deficit.  Strength equal and intact upper and lower extremities.  Skin: Skin is warm. She is not diaphoretic.  Psychiatric: She has a normal mood and affect. Her behavior is normal.  Vitals reviewed.         Assessment & Plan:  Please see problem based charting for assessment and plan.

## 2014-09-21 ENCOUNTER — Telehealth: Payer: Self-pay | Admitting: Internal Medicine

## 2014-09-21 ENCOUNTER — Telehealth: Payer: Self-pay | Admitting: *Deleted

## 2014-09-21 LAB — TSH: TSH: 3.82 u[IU]/mL (ref 0.450–4.500)

## 2014-09-21 MED ORDER — LEVOTHYROXINE SODIUM 112 MCG PO TABS: 112.0000 ug | ORAL_TABLET | Freq: Every day | ORAL | Status: DC

## 2014-09-21 NOTE — Telephone Encounter (Signed)
Spoke w/ pt about lab results. Told her that her creatinine was elevated compared to past levels and she needs to f/u with her PCP. I told her I can fax a copy to her PCP, Dr. Redmond Pulling. She verbalized understanding.   Faxed lab results to Dr. Redmond Pulling on 09/21/14.

## 2014-09-21 NOTE — Assessment & Plan Note (Signed)
  Assessment: Progress toward smoking cessation:   improving, smoking a little less Barriers to progress toward smoking cessation:   increase stress Comments: smoking ~ 1 ppd  Plan: Instruction/counseling given:  I counseled patient on the dangers of tobacco use, advised patient to stop smoking, and reviewed strategies to maximize success. Educational resources provided:  other (see comments), [place] (1-800-QUIT-NOW) brochure (denied) Self management tools provided:    Medications to assist with smoking cessation:  Hesitant to use nicoderm in this patient with significant PVD (but weighing cost vs benefit the smoking is significant risk in PVD).  Will not use Chantix given her depression; I would like her to be more stable on depression therapy and seeing psychiatry before implementing a med like Chantix.  Zyban may be a good option but also risk of increased SI and I would like to see improvement in depression before making this change. Patient agreed to the following self-care plans for smoking cessation:    Other plans: patient to reconnect with mental health provider and once depression more stable can consider introducing additional therapy (perhaps tapering Zoloft and starting Zyban).  Continue to address at next visit in 1 month.

## 2014-09-21 NOTE — Telephone Encounter (Signed)
I returned [name]'s call and informed her of TSH level WNL and she should stay on current dose of levothyroxine, 163mcg daily.  I also informed her of date for MRI and she had already been contacted and made aware.  Also, I informed her that she would need to see Dr. Casimiro Needle for referral to new psychiatrist (per 08/09/14 telephone note).  She agrees to this but request that we call to arrange appt with Dr. Karen Chafe office.  I will ask Reuben Likes, CSW if she is able to arrange appt for [name].

## 2014-09-21 NOTE — Assessment & Plan Note (Addendum)
She was recently evaluated by Dr. Jaynee Eagles (09/06) and I appreciate her eval and recommendations.  [name] reported a family hx of aneurysm and given her symptoms Dr. Jaynee Eagles has ordered MRI and MRA brain.  [name] also reported episodes of confusion where she cannot move but is awake so an EEG was ordered to look for epileptiform activity.  Sleep study also ordered to evaluate for OSA.  [name] is still having headache pain that she says is constant, daily.  She reports taking about six 500mg  tylenol daily for a few months.  I suspect she has additional medication overuse headache at this point.   - patient to obtain MRI/MRA brain (09/22), EEG and sleep study - follow-up with Dr. Jaynee Eagles - STOP Tylenol and see if headache pain subsides (I explained that it may not resolve completely) with this drug holiday - continue Flexeril prn (she only takes this if neck muscles are painful/spasm) and she understands it may make her drowsy so best to take at night if needed; Dr. Jaynee Eagles is in agreement with using this med - In the future, I would be very hesitant to use narcotic in this patient given hx of persistent depression (particularly avoid Tramadol with SSRI use); also avoid NSAID given CKD and ASA allergy. - return to see me in 1 month

## 2014-09-21 NOTE — Assessment & Plan Note (Signed)
BP Readings from Last 3 Encounters:  09/20/14 137/74  09/19/14 141/86  08/07/14 130/80    Lab Results  Component Value Date   NA 142 09/19/2014   K 4.7 09/19/2014   CREATININE 1.55* 09/19/2014    Assessment: Blood pressure control:  well controlled Progress toward BP goal:   at goal Comments: Compliant with therapy.  Plan: Medications:  continue current medications:  Amlodipine 10mg  daily, lisinopril 40mg  daily, triamterene-HCTZ 37.5/25mg  dialy Educational resources provided: brochure (denied) Self management tools provided:   Other plans: RTC in 1 months.

## 2014-09-21 NOTE — Assessment & Plan Note (Signed)
Previous TSH elevated but she had not taken Synthroid in a few days prior to the lab (she ran out of med due to financial issue).  She now has her meds and reports compliance with Synthroid. - TSH today is WNL so will continue current dose of 185mcg daily

## 2014-09-21 NOTE — Telephone Encounter (Signed)
Patient states that Dr Redmond Pulling contacted her today and left a message to call the office back. States she thinks it was in regards to her appointment yesterday. I do not see any messages in the system

## 2014-09-22 ENCOUNTER — Telehealth: Payer: Self-pay | Admitting: Licensed Clinical Social Worker

## 2014-09-22 NOTE — Telephone Encounter (Signed)
CSW forwarded telephone note, pt requesting assistance scheduling with [place], Dr. Casimiro Needle.  CSW placed call to [facility].  Ms. Sayed no-showed her appointment to establish care; pt has not been seen by Dr. Casimiro Needle in an outpatient setting.  CSW placed call to Ms. Jennings to inquire if pt would like referral to another psychiatrist in the area or if pt would like to stay with Dr. Casimiro Needle.  Dr. Casimiro Needle is only provider at [facility] location open to new adult patients.  Pt requested afternoon appointment.  Appointment scheduled 11/08/14 at 1:45 with Dr. Casimiro Needle.  Pt notified that if she is unable to make this appointment she will not be able to be rescheduled at this location.

## 2014-09-22 NOTE — Telephone Encounter (Signed)
Pt is scheduled with [employer] on 11/08/14 at 1:45pm.  Telephone note to patient and letter mailed.

## 2014-09-26 ENCOUNTER — Encounter (HOSPITAL_COMMUNITY): Payer: Self-pay

## 2014-10-03 ENCOUNTER — Ambulatory Visit: Payer: Self-pay | Admitting: Vascular Surgery

## 2014-10-05 ENCOUNTER — Other Ambulatory Visit: Payer: Self-pay

## 2014-10-09 ENCOUNTER — Other Ambulatory Visit: Payer: Commercial Managed Care - HMO

## 2014-10-10 ENCOUNTER — Encounter (HOSPITAL_COMMUNITY): Payer: Self-pay

## 2014-10-12 ENCOUNTER — Telehealth: Payer: Self-pay

## 2014-10-12 NOTE — Telephone Encounter (Signed)
Spoke to pt and advised her that her appt needs to be rescheduled. Pt amenable. Pt wishes for appt to be on 10/27 at 1:00. appt changed.

## 2014-10-13 ENCOUNTER — Ambulatory Visit (HOSPITAL_COMMUNITY)
Admission: RE | Admit: 2014-10-13 | Discharge: 2014-10-13 | Disposition: A | Discharge status: Home / Self Care | Payer: Commercial Managed Care - HMO | Source: Ambulatory Visit | Attending: Vascular Surgery | Admitting: Vascular Surgery

## 2014-10-13 ENCOUNTER — Encounter: Payer: Self-pay | Admitting: Vascular Surgery

## 2014-10-13 ENCOUNTER — Ambulatory Visit (INDEPENDENT_AMBULATORY_CARE_PROVIDER_SITE_OTHER)
Admission: RE | Admit: 2014-10-13 | Discharge: 2014-10-13 | Disposition: A | Discharge status: Home / Self Care | Payer: Commercial Managed Care - HMO | Source: Ambulatory Visit | Attending: Vascular Surgery | Admitting: Vascular Surgery

## 2014-10-13 DIAGNOSIS — E785 Hyperlipidemia, unspecified: Secondary | ICD-10-CM | POA: Insufficient documentation

## 2014-10-13 DIAGNOSIS — I739 Peripheral vascular disease, unspecified: Secondary | ICD-10-CM

## 2014-10-13 DIAGNOSIS — I10 Essential (primary) hypertension: Secondary | ICD-10-CM | POA: Diagnosis not present

## 2014-10-14 ENCOUNTER — Other Ambulatory Visit: Payer: Self-pay | Admitting: Internal Medicine

## 2014-10-17 ENCOUNTER — Ambulatory Visit: Payer: Self-pay | Admitting: Vascular Surgery

## 2014-10-17 ENCOUNTER — Other Ambulatory Visit: Payer: Self-pay | Admitting: Internal Medicine

## 2014-10-17 ENCOUNTER — Other Ambulatory Visit: Payer: Self-pay | Admitting: Vascular Surgery

## 2014-10-17 ENCOUNTER — Telehealth: Payer: Self-pay | Admitting: Neurology

## 2014-10-17 NOTE — Telephone Encounter (Signed)
Called and spoke with Anderson Malta who transferred me to [facility]. Advised that our office did not place stents. They would have to call who placed stents for pt. We do not have model #. She did not know who to call. I looked back in pt chart and looks like she went to [facility] [place]. I gave her their contact information [phone] to call. She verbalized understanding.

## 2014-10-17 NOTE — Telephone Encounter (Signed)
[facility] [phone] called requesting information on stents in both legs. Needs model # and any information we may have on them. Patient is scheduled for MRI tomorrow 1:00pm.

## 2014-10-17 NOTE — Telephone Encounter (Signed)
Called pharm, refills available, called pt and told her to go to pharm

## 2014-10-17 NOTE — Telephone Encounter (Signed)
Pt called requesting doxepin, pantoprazole and levothyroxine to be filled @ [facility].

## 2014-10-18 ENCOUNTER — Ambulatory Visit
Admission: RE | Admit: 2014-10-18 | Discharge: 2014-10-18 | Disposition: A | Discharge status: Home / Self Care | Payer: Commercial Managed Care - HMO | Source: Ambulatory Visit | Attending: Neurology | Admitting: Neurology

## 2014-10-18 DIAGNOSIS — R51 Headache: Secondary | ICD-10-CM | POA: Diagnosis not present

## 2014-10-18 DIAGNOSIS — H93A2 Pulsatile tinnitus, left ear: Secondary | ICD-10-CM

## 2014-10-18 DIAGNOSIS — H538 Other visual disturbances: Secondary | ICD-10-CM

## 2014-10-18 DIAGNOSIS — Z8249 Family history of ischemic heart disease and other diseases of the circulatory system: Secondary | ICD-10-CM

## 2014-10-18 DIAGNOSIS — R519 Headache, unspecified: Secondary | ICD-10-CM

## 2014-10-18 MED ORDER — GADOBENATE DIMEGLUMINE 529 MG/ML IV SOLN
10.0000 mL | Freq: Once | INTRAVENOUS | Status: AC | PRN
  Administered 2014-10-18: 10 mL via INTRAVENOUS

## 2014-10-18 MED ORDER — DIPHENHYDRAMINE HCL 50 MG PO CAPS: 50.0000 mg | ORAL_CAPSULE | Freq: Once | ORAL | Status: DC

## 2014-10-18 NOTE — Significant Event (Signed)
I was called to see the patient in MRI at [facility]. When I arrived she was sitting upright on the MRI table pointing to her chest and describing chest tightness and difficulty catching her breath. This occurred a few minutes after IV contrast was injected. The patient was able to speak in near complete sentences without audible wheezing or stridor. Her lungs were clear with good air movement on auscultation. O2 saturation was 94% on RA.   The patient's symptoms quickly began improving as she was being evaluated in the nurse's holding area. 50 mg Benadryl po was administered and patient monitored for an additional 30-45 minutes. She was symptom free at the time of discharge other than a baseline headache. The patient reports severe claustrophobia, and it is unclear whether this reaction reflected claustrophobia, a true allergic reaction, or a combination of both. A 13 hour steroid prep should be considered for future contrasted MRI examinations.

## 2014-10-19 ENCOUNTER — Institutional Professional Consult (permissible substitution): Payer: Commercial Managed Care - HMO | Admitting: Neurology

## 2014-10-20 ENCOUNTER — Telehealth: Payer: Self-pay | Admitting: *Deleted

## 2014-10-20 NOTE — Telephone Encounter (Signed)
-----   Message from Melvenia Beam, MD sent at 10/20/2014  9:59 AM EDT ----- Let patient know her MRI was unremarkable for her age, no causes seen for her headache. She has some white-matter changes that are within normal limits for her age. The MRA was normal, no aneurysms found. thanks

## 2014-10-20 NOTE — Telephone Encounter (Signed)
Left detailed message for pt per Dr. Jaynee Eagles message about results. Advised MRI brain unremarkable for her age, no causes seen for her headaches. She has some-white matter changed that are WNL for her age. MRA normal, no aneurysms found. Told her to call back if she has questions. Gave [facility] phone number and hours.

## 2014-10-24 ENCOUNTER — Other Ambulatory Visit: Payer: Commercial Managed Care - HMO

## 2014-10-25 ENCOUNTER — Encounter: Payer: Self-pay | Admitting: Internal Medicine

## 2014-10-25 ENCOUNTER — Ambulatory Visit (INDEPENDENT_AMBULATORY_CARE_PROVIDER_SITE_OTHER): Payer: Commercial Managed Care - HMO | Admitting: Internal Medicine

## 2014-10-25 VITALS — BP 115/72 | HR 89 | Temp 97.8°F | Ht 61.9 in | Wt 215.3 lb

## 2014-10-25 DIAGNOSIS — F332 Major depressive disorder, recurrent severe without psychotic features: Secondary | ICD-10-CM | POA: Diagnosis not present

## 2014-10-25 DIAGNOSIS — R51 Headache: Secondary | ICD-10-CM

## 2014-10-25 DIAGNOSIS — Z1239 Encounter for other screening for malignant neoplasm of breast: Secondary | ICD-10-CM

## 2014-10-25 DIAGNOSIS — F17200 Nicotine dependence, unspecified, uncomplicated: Secondary | ICD-10-CM | POA: Diagnosis not present

## 2014-10-25 DIAGNOSIS — R1310 Dysphagia, unspecified: Secondary | ICD-10-CM | POA: Diagnosis not present

## 2014-10-25 DIAGNOSIS — Z0000 Encounter for general adult medical examination without abnormal findings: Secondary | ICD-10-CM

## 2014-10-25 DIAGNOSIS — R519 Headache, unspecified: Secondary | ICD-10-CM

## 2014-10-25 NOTE — Patient Instructions (Signed)
1. I am referring you for mammogram and barium swallow.  Please be sure to follow-up with Dr. Casimiro Needle next week and discuss the possibility of adding Zyban for smoking cessation and depression.   2. Please take all medications as prescribed.    3. If you have worsening of your symptoms or new symptoms arise, please call the clinic ([phone]), or go to the ER immediately if symptoms are severe.   Come back to see me in 1 month for follow-up.

## 2014-10-25 NOTE — Assessment & Plan Note (Addendum)
Compliant with Zoloft daily.  Mood is "non-chalant," sleep is about the same, doxepin works for sleep after 3-4 hours, no SI.  Her husband passed a few months ago and she feels the stress of living in the home and taking care of home/managing expenses is stressful.  She is looking for smaller, more affordable housing option.  Will see Dr. Casimiro Needle (Psychiatrist) next week and she intends to continue her psych care with him.  - continue Zoloft and doxepin - will ask Dr. Casimiro Needle to weigh in on appropriateness of adding Zyban to her regimen for help with depression and smoking cessation - she would like to continue to follow-up with me q monthly until things are more stable - she will keep her appt with Dr. Casimiro Needle next week

## 2014-10-25 NOTE — Progress Notes (Addendum)
Subjective:    Patient ID: [name], female    DOB: 12-May-1950, 64 y.o.   MRN: [id number]  HPI Comments: [name] is a 64 year old woman with PMH as below here for follow-up of headaches and depression.  Please see problem based charting for status of her chronic conditions.      Review of Systems  Constitutional: Positive for appetite change. Negative for fever and chills.  Respiratory: Negative for shortness of breath.   Cardiovascular: Negative for chest pain and leg swelling.  Gastrointestinal: Negative for nausea, vomiting, diarrhea and constipation.  Neurological: Positive for dizziness and headaches. Negative for seizures and weakness.       Improved with flexeril  Psychiatric/Behavioral: Negative for suicidal ideas.       Past Medical History  Diagnosis Date  . Hypothyroidism   . Hypertension   . TIA (transient ischemic attack)   . PUD (peptic ulcer disease)   . HLD (hyperlipidemia)   . Depression   . GERD (gastroesophageal reflux disease)   . Diverticulosis   . PAD (peripheral artery disease) ([place])   . Anal fissure   . Anxiety   . Chronic headaches   . Cataract   . Glaucoma   . Allergy    Current Outpatient Prescriptions on File Prior to Visit  Medication Sig Dispense Refill  . amLODipine (NORVASC) 10 MG tablet Take 1 tablet (10 mg total) by mouth daily. 90 tablet 1  . clopidogrel (PLAVIX) 75 MG tablet Take 1 tablet (75 mg total) by mouth daily. 90 tablet 1  . cyclobenzaprine (FLEXERIL) 10 MG tablet Take 1 tablet (10 mg total) by mouth every 8 (eight) hours as needed for muscle spasms. 60 tablet 6  . doxepin (SINEQUAN) 100 MG capsule TAKE 1 CAPSULE BY MOUTH AT BEDTIME. 30 capsule 0  . levothyroxine (SYNTHROID, LEVOTHROID) 112 MCG tablet Take 1 tablet (112 mcg total) by mouth daily before breakfast. 90 tablet 0  . lisinopril (PRINIVIL,ZESTRIL) 40 MG tablet Take 1 tablet (40 mg total) by mouth daily. 90 tablet 1  . pantoprazole (PROTONIX) 40 MG tablet  TAKE 1 TABLET BY MOUTH 2 TIMES DAILY. 60 tablet 3  . potassium gluconate 595 MG TABS tablet Take 595 mg by mouth daily.    . rosuvastatin (CRESTOR) 20 MG tablet Take 1 tablet (20 mg total) by mouth at bedtime. 30 tablet 12  . sertraline (ZOLOFT) 100 MG tablet Take 1 tablet (100 mg total) by mouth daily. 90 tablet 1  . Simethicone (GAS-X PO) Take 1 tablet by mouth every 6 (six) hours as needed (for flatulence).    . triamterene-hydrochlorothiazide (MAXZIDE-25) 37.5-25 MG per tablet Take 1 tablet by mouth daily. 90 tablet 1   No current facility-administered medications on file prior to visit.   Filed Vitals:   10/25/14 1349  BP: 115/72  Pulse: 89  Temp: 97.8 F (36.6 C)  TempSrc: Oral  Height: 5' 1.9" (1.572 m)  Weight: 215 lb 4.8 oz (97.659 kg)  SpO2: 100%    Objective:   Physical Exam  Constitutional: She is oriented to person, place, and time. She appears well-developed. No distress.  HENT:  Head: Normocephalic and atraumatic.  Mouth/Throat: Oropharynx is clear and moist. No oropharyngeal exudate.  Eyes: EOM are normal. Pupils are equal, round, and reactive to light.  Cardiovascular: Normal rate, regular rhythm and normal heart sounds.  Exam reveals no gallop and no friction rub.   No murmur heard. Pulmonary/Chest: Effort normal and breath sounds normal.  No respiratory distress. She has no wheezes. She has no rales.  Abdominal: Soft. Bowel sounds are normal. She exhibits no distension. There is no tenderness. There is no rebound.  Musculoskeletal: Normal range of motion. She exhibits no edema or tenderness.  Neurological: She is alert and oriented to person, place, and time. No cranial nerve deficit.  5/5 MMS upper and lower extremities.  Skin: Skin is warm. She is not diaphoretic.  Psychiatric: She has a normal mood and affect. Her behavior is normal. Judgment and thought content normal.  Vitals reviewed.         Assessment & Plan:  Please see problem based charting for  A&P.

## 2014-10-26 DIAGNOSIS — R1310 Dysphagia, unspecified: Secondary | ICD-10-CM | POA: Insufficient documentation

## 2014-10-26 NOTE — Assessment & Plan Note (Addendum)
Towards the end of the visit [name] reported that she is still having dysphagia and also odynophagia for solids but not liquids.  She has had this complaint in the past and was referred for EGD with was done earlier this year and revealed incidental duodenal AVMs but no source of dysphagia.  She feels as if things are getting stuck upon swallowing.  She is compliant with BID PPI.  She has not had weight loss.  Given her persistent symptoms, will obtain barium swallow to look for pathology (web, ring) that may have been missed on EGD.

## 2014-10-26 NOTE — Assessment & Plan Note (Signed)
Refer for mammo. She declines flu shot.

## 2014-10-26 NOTE — Assessment & Plan Note (Signed)
Ms. Kindler reports that the flexeril is bringing her headache pain down to about 5/10.  So far this med has been the one to offer the most relief.  No acute findings or etiology of her headache evident on MRI/MRA.  Neurology refilled flexeril.  She will get sleep study in the next few weeks (scheduled to see sleep specialist in 2 weeks) to determine if OSA may be playing a role in headache.  EEG scheduled to see if episodes of confusion may be seizure.   - continue flexeril as prescribed by neuro - keep appt with sleep specialist - EEG scheduled for 11/15/14 - can resume prn Tylenol but only prn and use sparingly - return to see me in 1 month per patient request for q monthly visits - she is agreeable to this plan

## 2014-10-26 NOTE — Assessment & Plan Note (Signed)
  Assessment: Progress toward smoking cessation:   unchanged, smoking 1 PPD Barriers to progress toward smoking cessation:   stress Comments: Interested in trying a smoking cessation drug.  Plan: Instruction/counseling given:  I counseled patient on the dangers of tobacco use, advised patient to stop smoking, and reviewed strategies to maximize success. Educational resources provided:    Self management tools provided:    Medications to assist with smoking cessation:   Patient agreed to the following self-care plans for smoking cessation: cut down the number of cigarettes smoked  Other plans: Since her depression is not well controlled at this time, I will ask Dr. Casimiro Needle to weigh in on the appropriateness of adding Zyban for depression and to help with smoking cessation.

## 2014-10-27 NOTE — Progress Notes (Signed)
Internal Medicine Clinic Attending  Case discussed with Dr. Wilson soon after the resident saw the patient.  We reviewed the resident's history and exam and pertinent patient test results.  I agree with the assessment, diagnosis, and plan of care documented in the resident's note.  

## 2014-10-31 ENCOUNTER — Emergency Department (HOSPITAL_COMMUNITY)
Admission: EM | Admit: 2014-10-31 | Discharge: 2014-10-31 | Disposition: A | Discharge status: Home / Self Care | Payer: Commercial Managed Care - HMO | Attending: Emergency Medicine | Admitting: Emergency Medicine

## 2014-10-31 ENCOUNTER — Encounter (HOSPITAL_COMMUNITY): Payer: Self-pay | Admitting: Emergency Medicine

## 2014-10-31 ENCOUNTER — Emergency Department (HOSPITAL_COMMUNITY): Payer: Commercial Managed Care - HMO

## 2014-10-31 DIAGNOSIS — E785 Hyperlipidemia, unspecified: Secondary | ICD-10-CM | POA: Insufficient documentation

## 2014-10-31 DIAGNOSIS — Z8711 Personal history of peptic ulcer disease: Secondary | ICD-10-CM | POA: Insufficient documentation

## 2014-10-31 DIAGNOSIS — R251 Tremor, unspecified: Secondary | ICD-10-CM | POA: Diagnosis not present

## 2014-10-31 DIAGNOSIS — F1210 Cannabis abuse, uncomplicated: Secondary | ICD-10-CM | POA: Diagnosis not present

## 2014-10-31 DIAGNOSIS — W19XXXA Unspecified fall, initial encounter: Secondary | ICD-10-CM

## 2014-10-31 DIAGNOSIS — Z7902 Long term (current) use of antithrombotics/antiplatelets: Secondary | ICD-10-CM | POA: Insufficient documentation

## 2014-10-31 DIAGNOSIS — F419 Anxiety disorder, unspecified: Secondary | ICD-10-CM | POA: Diagnosis not present

## 2014-10-31 DIAGNOSIS — R519 Headache, unspecified: Secondary | ICD-10-CM

## 2014-10-31 DIAGNOSIS — H409 Unspecified glaucoma: Secondary | ICD-10-CM | POA: Insufficient documentation

## 2014-10-31 DIAGNOSIS — S3992XA Unspecified injury of lower back, initial encounter: Secondary | ICD-10-CM | POA: Diagnosis not present

## 2014-10-31 DIAGNOSIS — Y9389 Activity, other specified: Secondary | ICD-10-CM | POA: Insufficient documentation

## 2014-10-31 DIAGNOSIS — Y998 Other external cause status: Secondary | ICD-10-CM | POA: Diagnosis not present

## 2014-10-31 DIAGNOSIS — G8929 Other chronic pain: Secondary | ICD-10-CM | POA: Diagnosis not present

## 2014-10-31 DIAGNOSIS — N289 Disorder of kidney and ureter, unspecified: Secondary | ICD-10-CM

## 2014-10-31 DIAGNOSIS — Z720 Tobacco use: Secondary | ICD-10-CM | POA: Insufficient documentation

## 2014-10-31 DIAGNOSIS — F329 Major depressive disorder, single episode, unspecified: Secondary | ICD-10-CM | POA: Diagnosis not present

## 2014-10-31 DIAGNOSIS — M542 Cervicalgia: Secondary | ICD-10-CM | POA: Diagnosis not present

## 2014-10-31 DIAGNOSIS — S0990XA Unspecified injury of head, initial encounter: Secondary | ICD-10-CM | POA: Insufficient documentation

## 2014-10-31 DIAGNOSIS — E039 Hypothyroidism, unspecified: Secondary | ICD-10-CM | POA: Diagnosis not present

## 2014-10-31 DIAGNOSIS — Y9289 Other specified places as the place of occurrence of the external cause: Secondary | ICD-10-CM | POA: Insufficient documentation

## 2014-10-31 DIAGNOSIS — Z9842 Cataract extraction status, left eye: Secondary | ICD-10-CM | POA: Insufficient documentation

## 2014-10-31 DIAGNOSIS — Z9861 Coronary angioplasty status: Secondary | ICD-10-CM | POA: Insufficient documentation

## 2014-10-31 DIAGNOSIS — M545 Low back pain, unspecified: Secondary | ICD-10-CM

## 2014-10-31 DIAGNOSIS — R51 Headache: Secondary | ICD-10-CM

## 2014-10-31 DIAGNOSIS — W01198A Fall on same level from slipping, tripping and stumbling with subsequent striking against other object, initial encounter: Secondary | ICD-10-CM | POA: Insufficient documentation

## 2014-10-31 DIAGNOSIS — Z79899 Other long term (current) drug therapy: Secondary | ICD-10-CM | POA: Diagnosis not present

## 2014-10-31 DIAGNOSIS — K219 Gastro-esophageal reflux disease without esophagitis: Secondary | ICD-10-CM | POA: Insufficient documentation

## 2014-10-31 DIAGNOSIS — I10 Essential (primary) hypertension: Secondary | ICD-10-CM | POA: Diagnosis not present

## 2014-10-31 DIAGNOSIS — S199XXA Unspecified injury of neck, initial encounter: Secondary | ICD-10-CM | POA: Insufficient documentation

## 2014-10-31 DIAGNOSIS — F1410 Cocaine abuse, uncomplicated: Secondary | ICD-10-CM | POA: Diagnosis not present

## 2014-10-31 DIAGNOSIS — Z8673 Personal history of transient ischemic attack (TIA), and cerebral infarction without residual deficits: Secondary | ICD-10-CM | POA: Diagnosis not present

## 2014-10-31 DIAGNOSIS — Z880 Allergy status to penicillin: Secondary | ICD-10-CM | POA: Diagnosis not present

## 2014-10-31 DIAGNOSIS — R569 Unspecified convulsions: Secondary | ICD-10-CM | POA: Diagnosis not present

## 2014-10-31 LAB — CBC WITH DIFFERENTIAL/PLATELET
BASOS ABS: 0 10*3/uL (ref 0.0–0.1)
Basophils Relative: 0 %
Eosinophils Absolute: 0.1 10*3/uL (ref 0.0–0.7)
Eosinophils Relative: 1 %
HEMATOCRIT: 37.9 % (ref 36.0–46.0)
HEMOGLOBIN: 12.5 g/dL (ref 12.0–15.0)
LYMPHS ABS: 2.9 10*3/uL (ref 0.7–4.0)
LYMPHS PCT: 41 %
MCH: 30.8 pg (ref 26.0–34.0)
MCHC: 33 g/dL (ref 30.0–36.0)
MCV: 93.3 fL (ref 78.0–100.0)
Monocytes Absolute: 0.5 10*3/uL (ref 0.1–1.0)
Monocytes Relative: 7 %
NEUTROS ABS: 3.6 10*3/uL (ref 1.7–7.7)
Neutrophils Relative %: 51 %
Platelets: 185 10*3/uL (ref 150–400)
RBC: 4.06 MIL/uL (ref 3.87–5.11)
RDW: 14 % (ref 11.5–15.5)
WBC: 7.1 10*3/uL (ref 4.0–10.5)

## 2014-10-31 LAB — RAPID URINE DRUG SCREEN, HOSP PERFORMED
Amphetamines: NOT DETECTED
BARBITURATES: NOT DETECTED
Benzodiazepines: NOT DETECTED
Cocaine: POSITIVE — AB
Opiates: NOT DETECTED
TETRAHYDROCANNABINOL: POSITIVE — AB

## 2014-10-31 LAB — URINALYSIS, ROUTINE W REFLEX MICROSCOPIC
BILIRUBIN URINE: NEGATIVE
Glucose, UA: NEGATIVE mg/dL
Hgb urine dipstick: NEGATIVE
KETONES UR: NEGATIVE mg/dL
Leukocytes, UA: NEGATIVE
NITRITE: NEGATIVE
PH: 6 (ref 5.0–8.0)
Protein, ur: NEGATIVE mg/dL
Specific Gravity, Urine: 1.018 (ref 1.005–1.030)
UROBILINOGEN UA: 0.2 mg/dL (ref 0.0–1.0)

## 2014-10-31 LAB — BASIC METABOLIC PANEL
ANION GAP: 6 (ref 5–15)
BUN: 32 mg/dL — ABNORMAL HIGH (ref 6–20)
CHLORIDE: 105 mmol/L (ref 101–111)
CO2: 28 mmol/L (ref 22–32)
Calcium: 9.4 mg/dL (ref 8.9–10.3)
Creatinine, Ser: 1.83 mg/dL — ABNORMAL HIGH (ref 0.44–1.00)
GFR calc Af Amer: 32 mL/min — ABNORMAL LOW (ref >60)
GFR, EST NON AFRICAN AMERICAN: 28 mL/min — AB (ref >60)
GLUCOSE: 91 mg/dL (ref 65–99)
POTASSIUM: 4.8 mmol/L (ref 3.5–5.1)
SODIUM: 139 mmol/L (ref 135–145)

## 2014-10-31 MED ORDER — SODIUM CHLORIDE 0.9 % IV BOLUS (SEPSIS): 1000.0000 mL | Freq: Once | INTRAVENOUS | Status: DC

## 2014-10-31 MED ORDER — ACETAMINOPHEN 325 MG PO TABS
650.0000 mg | ORAL_TABLET | Freq: Once | ORAL | Status: AC
  Administered 2014-10-31: 650 mg via ORAL
  Filled 2014-10-31: qty 2

## 2014-10-31 NOTE — ED Notes (Signed)
Pt was stuck four times by two different nurses in an attempt to acquire IV access without success.  Pt refuses to be stuck anymore stating "You just need to figure something else to do."

## 2014-10-31 NOTE — ED Notes (Signed)
Bed: FE07 Expected date:  Expected time:  Means of arrival:  Comments: EMS-SEIZURE

## 2014-10-31 NOTE — ED Notes (Addendum)
Per EMS. Pt from home. Pt reports she had an episode of tremors that lasted 10 to 15 minutes. Remembers the entire event, only could not hear during the event. Pt fell during her episode of tremors and is complaining of lower back pain and neck pain that began during transport. No incontinence or trauma noted. MD at bedside.

## 2014-10-31 NOTE — Discharge Instructions (Signed)
You need to make sure to drink plenty of fluids for the next couple of days. As discussed your kidney tests were elevated. Have your labs rechecked in the next week. General Headache Without Cause A headache is pain or discomfort felt around the head or neck area. The specific cause of a headache may not be found. There are many causes and types of headaches. A few common ones are:  Tension headaches.  Migraine headaches.  Cluster headaches.  Chronic daily headaches. HOME CARE INSTRUCTIONS  Watch your condition for any changes. Take these steps to help with your condition: Managing Pain  Take over-the-counter and prescription medicines only as told by your health care provider.  Lie down in a dark, quiet room when you have a headache.  If directed, apply ice to the head and neck area:  Put ice in a plastic bag.  Place a towel between your skin and the bag.  Leave the ice on for 20 minutes, 2-3 times per day.  Use a heating pad or hot shower to apply heat to the head and neck area as told by your health care provider.  Keep lights dim if bright lights bother you or make your headaches worse. Eating and Drinking  Eat meals on a regular schedule.  Limit alcohol use.  Decrease the amount of caffeine you drink, or stop drinking caffeine. General Instructions  Keep all follow-up visits as told by your health care provider. This is important.  Keep a headache journal to help find out what may trigger your headaches. For example, write down:  What you eat and drink.  How much sleep you get.  Any change to your diet or medicines.  Try massage or other relaxation techniques.  Limit stress.  Sit up straight, and do not tense your muscles.  Do not use tobacco products, including cigarettes, chewing tobacco, or e-cigarettes. If you need help quitting, ask your health care provider.  Exercise regularly as told by your health care provider.  Sleep on a regular schedule.  Get 7-9 hours of sleep, or the amount recommended by your health care provider. SEEK MEDICAL CARE IF:   Your symptoms are not helped by medicine.  You have a headache that is different from the usual headache.  You have nausea or you vomit.  You have a fever. SEEK IMMEDIATE MEDICAL CARE IF:   Your headache becomes severe.  You have repeated vomiting.  You have a stiff neck.  You have a loss of vision.  You have problems with speech.  You have pain in the eye or ear.  You have muscular weakness or loss of muscle control.  You lose your balance or have trouble walking.  You feel faint or pass out.  You have confusion.   This information is not intended to replace advice given to you by your health care provider. Make sure you discuss any questions you have with your health care provider.   Document Released: 12/30/2004 Document Revised: 09/20/2014 Document Reviewed: 04/24/2014 Elsevier Interactive Patient Education [employer].

## 2014-10-31 NOTE — ED Notes (Addendum)
Patient transported to X-ray 

## 2014-10-31 NOTE — ED Notes (Signed)
Patient transported to CT 

## 2014-10-31 NOTE — ED Notes (Signed)
Pt transported to radiology, will collect labs when pt returns.

## 2014-10-31 NOTE — ED Provider Notes (Signed)
CSN: [id number]     Arrival date & time 10/31/14  1632 History   First MD Initiated Contact with Patient 10/31/14 1636     Chief Complaint  Patient presents with  . Tremors     (Consider location/radiation/quality/duration/timing/severity/associated sxs/prior Treatment) HPI Comments: Pt comes in with c/o 2 episodes of seizure today. She states that she has been having headaches and seizure over the last year. She has seen neurology and they have not started her on any medications. She states that she knows that she is going to have the episode and then she shakes but can't move and she can't here. She states that with the second episode today it happened outside and she fell and hit the back of her head. She remembers the whole event. Denies blurred vision, vomiting. She states that from the fall she has a posterior headache,neck pain and lower back pain. Denies numbness or weakness.   The history is provided by the patient. No language interpreter was used.    Past Medical History  Diagnosis Date  . Hypothyroidism   . Hypertension   . TIA (transient ischemic attack)   . PUD (peptic ulcer disease)   . HLD (hyperlipidemia)   . Depression   . GERD (gastroesophageal reflux disease)   . Diverticulosis   . PAD (peripheral artery disease) ([place])   . Anal fissure   . Anxiety   . Chronic headaches   . Cataract   . Glaucoma   . Allergy    Past Surgical History  Procedure Laterality Date  . Total thyroidectomy    . Aortogram    . Iliac artery angioplasty and stenting Left 12/2012  . Sfa stent Left 2012    at [facility]  . Abdominal aortagram N/A 01/20/2013    Procedure: ABDOMINAL Maxcine Ham;  Surgeon: Conrad Country Club, MD;  Location: [facility] CATH LAB;  Service: Cardiovascular;  Laterality: N/A;  . Anal fissure repair    . Cataract extraction Left   . Breast reduction surgery    . Stomach surgery     Family History  Problem Relation Age of Onset  . Peripheral vascular  disease Mother   . Heart disease Mother   . Hypertension Mother   . AAA (abdominal aortic aneurysm) Father   . Hypertension Father   . Uterine cancer Sister     mets to brain  . Liver cancer Brother   . Stomach cancer Maternal Aunt     x2  . Stomach cancer Paternal Aunt   . Heart disease Brother   . Kidney cancer Brother   . Anuerysm Brother     brain  . HIV Brother     x2  . Aneurysm Father    Social History  Substance Use Topics  . Smoking status: Current Every Day Smoker -- 1.00 packs/day    Types: Cigarettes  . Smokeless tobacco: Never Used     Comment: less thanTrying to qiut   . Alcohol Use: No     Comment: occasional wine   OB History    No data available     Review of Systems  All other systems reviewed and are negative.     Allergies  Asa; Penicillins; and Gadolinium derivatives  Home Medications   Prior to Admission medications   Medication Sig Start Date End Date Taking? Authorizing Provider  amLODipine (NORVASC) 10 MG tablet Take 1 tablet (10 mg total) by mouth daily. 08/07/14 08/07/15  Francesca Oman, DO  clopidogrel (PLAVIX) 75 MG tablet Take 1 tablet (75 mg total) by mouth daily. 08/07/14 08/07/15  Francesca Oman, DO  cyclobenzaprine (FLEXERIL) 10 MG tablet Take 1 tablet (10 mg total) by mouth every 8 (eight) hours as needed for muscle spasms. 09/19/14 09/19/15  Melvenia Beam, MD  doxepin (SINEQUAN) 100 MG capsule TAKE 1 CAPSULE BY MOUTH AT BEDTIME. 10/16/14   Francesca Oman, DO  levothyroxine (SYNTHROID, LEVOTHROID) 112 MCG tablet Take 1 tablet (112 mcg total) by mouth daily before breakfast. 09/21/14   Francesca Oman, DO  lisinopril (PRINIVIL,ZESTRIL) 40 MG tablet Take 1 tablet (40 mg total) by mouth daily. 08/07/14 08/07/15  Francesca Oman, DO  pantoprazole (PROTONIX) 40 MG tablet TAKE 1 TABLET BY MOUTH 2 TIMES DAILY. 10/16/14   Irene Shipper, MD  potassium gluconate 595 MG TABS tablet Take 595 mg by mouth daily.    Historical Provider, MD  rosuvastatin (CRESTOR)  20 MG tablet Take 1 tablet (20 mg total) by mouth at bedtime. 12/04/13 12/04/14  Francesca Oman, DO  sertraline (ZOLOFT) 100 MG tablet Take 1 tablet (100 mg total) by mouth daily. 08/07/14   Francesca Oman, DO  Simethicone (GAS-X PO) Take 1 tablet by mouth every 6 (six) hours as needed (for flatulence).    Historical Provider, MD  triamterene-hydrochlorothiazide (MAXZIDE-25) 37.5-25 MG per tablet Take 1 tablet by mouth daily. 08/07/14 08/07/15  Francesca Oman, DO   BP 105/67 mmHg  Pulse 87  Temp(Src) 98.6 F (37 C) (Oral)  Resp 20  SpO2 97% Physical Exam  Constitutional: She is oriented to person, place, and time. She appears well-developed and well-nourished.  HENT:  Head: Normocephalic and atraumatic.  Eyes: Conjunctivae and EOM are normal. Pupils are equal, round, and reactive to light.  Neck: Normal range of motion. Neck supple.  Cardiovascular: Normal rate and regular rhythm.   Pulmonary/Chest: Effort normal and breath sounds normal.  Abdominal: Soft. Bowel sounds are normal. There is no tenderness.  Musculoskeletal: Normal range of motion.       Cervical back: She exhibits bony tenderness.       Thoracic back: Normal.       Lumbar back: She exhibits bony tenderness.  Neurological: She is alert and oriented to person, place, and time. She exhibits normal muscle tone. Coordination normal.  Skin: Skin is warm and dry.  Psychiatric: She has a normal mood and affect.  Nursing note and vitals reviewed.   ED Course  Procedures (including critical care time) Labs Review Labs Reviewed  BASIC METABOLIC PANEL - Abnormal; Notable for the following:    BUN 32 (*)    Creatinine, Ser 1.83 (*)    GFR calc non Af Amer 28 (*)    GFR calc Af Amer 32 (*)    All other components within normal limits  URINE RAPID DRUG SCREEN, HOSP PERFORMED - Abnormal; Notable for the following:    Cocaine POSITIVE (*)    Tetrahydrocannabinol POSITIVE (*)    All other components within normal limits   URINALYSIS, ROUTINE W REFLEX MICROSCOPIC (NOT AT [facility]) - Abnormal; Notable for the following:    APPearance CLOUDY (*)    All other components within normal limits  CBC WITH DIFFERENTIAL/PLATELET    Imaging Review Dg Cervical Spine Complete  10/31/2014  CLINICAL DATA:  Pain at the nape of the neck up to the point of the ears and around the sides of the head to the ears, also lower back pain Pt  had a seizure and fell back on her back and head today No previous injuries EXAM: CERVICAL SPINE  4+ VIEWS COMPARISON:  CT 02/02/2009 FINDINGS: Normal alignment. Vertebral body and disc heights maintained throughout. Anterior endplate spurring at all cervical levels. Facet DJD at all levels, with osseous foraminal encroachment most marked C3-4 bilaterally. Negative for fracture. No prevertebral soft tissue swelling. Previous thyroidectomy clips. Multiple missing teeth and dental caries. IMPRESSION: 1. Negative for fracture or other acute bone abnormality. 2. Multilevel facet DJD and endplate spurring. Electronically Signed   By: Lucrezia Europe M.D.   On: 10/31/2014 17:30   Dg Lumbar Spine Complete  10/31/2014  CLINICAL DATA:  Pain at the nape of the neck up to the point of the ears and around the sides of the head to the ears, also lower back pain Pt had a seizure and fell back on her back and head today No previous injuries EXAM: LUMBAR SPINE - COMPLETE 4+ VIEW COMPARISON:  CT 04/21/2008 FINDINGS: Negative for fracture. Exuberant anterior endplate spurring in the visualized lower thoracic spine. Small endplate spurs at all lumbar levels. Grade 1 anterolisthesis L4-5 with mild narrowing of the interspace. No pars defect. Facet DJD bilaterally L4-5 and L5-S1. Moderate aortoiliac arterial calcifications with left iliac stents. Calcifications project over bilateral renal collecting systems. Degenerative changes in the Right hip. IMPRESSION: 1. Negative for fracture or other acute bone abnormality. 2. Grade 1  anterolisthesis L4-5 probably secondary to advanced facet disease. 3. Nephrolithiasis Electronically Signed   By: Lucrezia Europe M.D.   On: 10/31/2014 17:34   Ct Head Wo Contrast  10/31/2014  CLINICAL DATA:  Tremors, lasting 15 minutes.  Fall. EXAM: CT HEAD WITHOUT CONTRAST TECHNIQUE: Contiguous axial images were obtained from the base of the skull through the vertex without intravenous contrast. COMPARISON:  Multiple exams, including 10/18/2014 and 11/30/2008 FINDINGS: The brainstem, cerebellum, cerebral peduncles, thalamus, basal ganglia, basilar cisterns, and ventricular system appear within normal limits. Partially empty sella. No intracranial hemorrhage, mass lesion, or acute CVA. There is atherosclerotic calcification of the cavernous carotid arteries bilaterally. IMPRESSION: 1. No significant intracranial abnormality is identified to explain the patient's tremor episode. 2. Partially empty sella. Electronically Signed   By: Van Clines M.D.   On: 10/31/2014 17:46   I have personally reviewed and evaluated these images and lab results as part of my medical decision-making.   EKG Interpretation None      MDM   Final diagnoses:  Cocaine abuse  Marijuana abuse  Headache, unspecified headache type  Fall, initial encounter  Neck pain  Midline low back pain without sciatica  Renal insufficiency    Pt is refusing the iv. Discussed drinking fluids over the next couple of days and having labs checked. No acute injury noted on imaging. Pt is currently being evaluated by neurology for headache and tremor type symptoms    Glendell Docker, NP 10/31/14 1954  Sherwood Gambler, MD 10/31/14 [phone]

## 2014-10-31 NOTE — ED Notes (Signed)
UNABLE TO COLLECT LABS PATIENT IS NOT IN ROOM.

## 2014-11-03 ENCOUNTER — Encounter: Payer: Self-pay | Admitting: Vascular Surgery

## 2014-11-07 ENCOUNTER — Ambulatory Visit: Payer: Self-pay | Admitting: Vascular Surgery

## 2014-11-07 NOTE — Addendum Note (Signed)
Addended by: Hulan Fray on: 11/07/2014 04:06 PM   Modules accepted: Orders

## 2014-11-08 ENCOUNTER — Ambulatory Visit (HOSPITAL_COMMUNITY): Payer: Self-pay | Admitting: Psychiatry

## 2014-11-09 ENCOUNTER — Institutional Professional Consult (permissible substitution): Payer: Self-pay | Admitting: Neurology

## 2014-11-10 ENCOUNTER — Other Ambulatory Visit: Payer: Self-pay | Admitting: Internal Medicine

## 2014-11-10 ENCOUNTER — Encounter: Payer: Self-pay | Admitting: Neurology

## 2014-11-10 DIAGNOSIS — R1310 Dysphagia, unspecified: Secondary | ICD-10-CM

## 2014-11-15 ENCOUNTER — Other Ambulatory Visit: Payer: Commercial Managed Care - HMO

## 2014-11-15 ENCOUNTER — Other Ambulatory Visit: Payer: Self-pay

## 2014-11-16 ENCOUNTER — Ambulatory Visit (HOSPITAL_COMMUNITY): Admission: RE | Admit: 2014-11-16 | Payer: Commercial Managed Care - HMO | Source: Ambulatory Visit

## 2014-11-28 ENCOUNTER — Encounter: Payer: Self-pay | Admitting: Student

## 2014-11-29 ENCOUNTER — Encounter: Payer: Self-pay | Admitting: Internal Medicine

## 2014-12-05 ENCOUNTER — Institutional Professional Consult (permissible substitution): Payer: Commercial Managed Care - HMO | Admitting: Neurology

## 2014-12-13 ENCOUNTER — Other Ambulatory Visit: Payer: Self-pay | Admitting: Internal Medicine

## 2014-12-27 ENCOUNTER — Encounter: Payer: Self-pay | Admitting: Internal Medicine

## 2015-02-09 ENCOUNTER — Ambulatory Visit: Payer: Self-pay | Admitting: Internal Medicine

## 2015-02-12 ENCOUNTER — Encounter (HOSPITAL_COMMUNITY): Payer: Self-pay | Admitting: Emergency Medicine

## 2015-02-12 ENCOUNTER — Ambulatory Visit (INDEPENDENT_AMBULATORY_CARE_PROVIDER_SITE_OTHER): Payer: Commercial Managed Care - HMO | Admitting: Internal Medicine

## 2015-02-12 ENCOUNTER — Encounter: Payer: Self-pay | Admitting: Internal Medicine

## 2015-02-12 ENCOUNTER — Emergency Department (HOSPITAL_COMMUNITY)
Admission: EM | Admit: 2015-02-12 | Discharge: 2015-02-13 | Disposition: A | Discharge status: Other Acute Inpatient Hospital | Payer: Commercial Managed Care - HMO | Attending: Emergency Medicine | Admitting: Emergency Medicine

## 2015-02-12 VITALS — BP 157/84 | HR 86 | Temp 98.3°F | Wt 210.1 lb

## 2015-02-12 DIAGNOSIS — F332 Major depressive disorder, recurrent severe without psychotic features: Secondary | ICD-10-CM | POA: Diagnosis not present

## 2015-02-12 DIAGNOSIS — Z79899 Other long term (current) drug therapy: Secondary | ICD-10-CM | POA: Diagnosis not present

## 2015-02-12 DIAGNOSIS — I739 Peripheral vascular disease, unspecified: Secondary | ICD-10-CM | POA: Diagnosis not present

## 2015-02-12 DIAGNOSIS — F329 Major depressive disorder, single episode, unspecified: Secondary | ICD-10-CM | POA: Diagnosis not present

## 2015-02-12 DIAGNOSIS — E039 Hypothyroidism, unspecified: Secondary | ICD-10-CM | POA: Diagnosis not present

## 2015-02-12 DIAGNOSIS — R45851 Suicidal ideations: Secondary | ICD-10-CM

## 2015-02-12 DIAGNOSIS — I10 Essential (primary) hypertension: Secondary | ICD-10-CM

## 2015-02-12 DIAGNOSIS — Z8673 Personal history of transient ischemic attack (TIA), and cerebral infarction without residual deficits: Secondary | ICD-10-CM | POA: Insufficient documentation

## 2015-02-12 DIAGNOSIS — Z880 Allergy status to penicillin: Secondary | ICD-10-CM | POA: Insufficient documentation

## 2015-02-12 DIAGNOSIS — F17210 Nicotine dependence, cigarettes, uncomplicated: Secondary | ICD-10-CM | POA: Insufficient documentation

## 2015-02-12 DIAGNOSIS — Z8711 Personal history of peptic ulcer disease: Secondary | ICD-10-CM | POA: Insufficient documentation

## 2015-02-12 DIAGNOSIS — Z8719 Personal history of other diseases of the digestive system: Secondary | ICD-10-CM | POA: Diagnosis not present

## 2015-02-12 DIAGNOSIS — G8929 Other chronic pain: Secondary | ICD-10-CM | POA: Diagnosis not present

## 2015-02-12 DIAGNOSIS — G479 Sleep disorder, unspecified: Secondary | ICD-10-CM | POA: Insufficient documentation

## 2015-02-12 DIAGNOSIS — R630 Anorexia: Secondary | ICD-10-CM | POA: Diagnosis not present

## 2015-02-12 DIAGNOSIS — F32A Depression, unspecified: Secondary | ICD-10-CM

## 2015-02-12 LAB — SALICYLATE LEVEL: Salicylate Lvl: <4 mg/dL (ref 2.8–30.0)

## 2015-02-12 LAB — COMPREHENSIVE METABOLIC PANEL
ALT: 16 U/L (ref 14–54)
ANION GAP: 13 (ref 5–15)
AST: 29 U/L (ref 15–41)
Albumin: 4 g/dL (ref 3.5–5.0)
Alkaline Phosphatase: 95 U/L (ref 38–126)
BILIRUBIN TOTAL: 0.8 mg/dL (ref 0.3–1.2)
BUN: 12 mg/dL (ref 6–20)
CO2: 23 mmol/L (ref 22–32)
Calcium: 9.7 mg/dL (ref 8.9–10.3)
Chloride: 106 mmol/L (ref 101–111)
Creatinine, Ser: 1.27 mg/dL — ABNORMAL HIGH (ref 0.44–1.00)
GFR calc Af Amer: 50 mL/min — ABNORMAL LOW (ref >60)
GFR, EST NON AFRICAN AMERICAN: 43 mL/min — AB (ref >60)
GLUCOSE: 100 mg/dL — AB (ref 65–99)
POTASSIUM: 4.7 mmol/L (ref 3.5–5.1)
Sodium: 142 mmol/L (ref 135–145)
Total Protein: 7.1 g/dL (ref 6.5–8.1)

## 2015-02-12 LAB — ETHANOL: Alcohol, Ethyl (B): <5 mg/dL (ref <5)

## 2015-02-12 LAB — CBC
HEMATOCRIT: 44.7 % (ref 36.0–46.0)
HEMOGLOBIN: 14.8 g/dL (ref 12.0–15.0)
MCH: 31.4 pg (ref 26.0–34.0)
MCHC: 33.1 g/dL (ref 30.0–36.0)
MCV: 94.7 fL (ref 78.0–100.0)
Platelets: 171 10*3/uL (ref 150–400)
RBC: 4.72 MIL/uL (ref 3.87–5.11)
RDW: 13.7 % (ref 11.5–15.5)
WBC: 6.2 10*3/uL (ref 4.0–10.5)

## 2015-02-12 LAB — ACETAMINOPHEN LEVEL: Acetaminophen (Tylenol), Serum: <10 ug/mL — ABNORMAL LOW (ref 10–30)

## 2015-02-12 MED ORDER — AMLODIPINE BESYLATE 10 MG PO TABS: 10.0000 mg | ORAL_TABLET | Freq: Every day | ORAL | Status: DC

## 2015-02-12 MED ORDER — CLOPIDOGREL BISULFATE 75 MG PO TABS: 75.0000 mg | ORAL_TABLET | Freq: Every day | ORAL | Status: DC

## 2015-02-12 MED ORDER — LEVOTHYROXINE SODIUM 112 MCG PO TABS: 112.0000 ug | ORAL_TABLET | Freq: Every day | ORAL | Status: DC

## 2015-02-12 MED ORDER — HYDROCODONE-ACETAMINOPHEN 5-325 MG PO TABS
2.0000 | ORAL_TABLET | Freq: Once | ORAL | Status: AC
  Administered 2015-02-12: 2 via ORAL
  Filled 2015-02-12: qty 2

## 2015-02-12 NOTE — Progress Notes (Signed)
Subjective:    Patient ID: [name], female    DOB: April 18, 1950, 65 y.o.   MRN: [id number]  HPI Comments: [name] is a 65 year old woman with PMH as below with c/o of worsening depression and increasing LE claudication.  She has been off of her medications including Zoloft, Doxepin, Plavix, Crestor for about two months.  She has them but says she has not felt like taking anything and just wants to die.  She has continued Synthroid up until 2 days ago.  She reports feelings of guilt, poor appetite, inability to sleep.  She says she does have a plan for suicide but will not share the plan with me.  She says she would like help.  Regarding her PVD, she has pain with minimal distance but not a rest.  She continues to smoke and stopped Plavix two months ago due to severe depression.  She says she does want some relief from the leg pain.    Past Medical History  Diagnosis Date  . Hypothyroidism   . Hypertension   . TIA (transient ischemic attack)   . PUD (peptic ulcer disease)   . HLD (hyperlipidemia)   . Depression   . GERD (gastroesophageal reflux disease)   . Diverticulosis   . PAD (peripheral artery disease) ([place])   . Anal fissure   . Anxiety   . Chronic headaches   . Cataract   . Glaucoma   . Allergy    Current Outpatient Prescriptions on File Prior to Visit  Medication Sig Dispense Refill  . acetaminophen (TYLENOL) 500 MG tablet Take 1,000 mg by mouth every 6 (six) hours as needed for headache.    Marland Kitchen amLODipine (NORVASC) 10 MG tablet Take 1 tablet (10 mg total) by mouth daily. 90 tablet 1  . clopidogrel (PLAVIX) 75 MG tablet Take 1 tablet (75 mg total) by mouth daily. 90 tablet 1  . cyanocobalamin 500 MCG tablet Take 500 mcg by mouth daily.    . cyclobenzaprine (FLEXERIL) 10 MG tablet Take 1 tablet (10 mg total) by mouth every 8 (eight) hours as needed for muscle spasms. 60 tablet 6  . doxepin (SINEQUAN) 100 MG capsule TAKE 1 CAPSULE BY MOUTH AT BEDTIME. 30 capsule 0  .  Ergocalciferol (VITAMIN D2) 2000 UNITS TABS Take 1 tablet by mouth daily.    Marland Kitchen levothyroxine (SYNTHROID, LEVOTHROID) 112 MCG tablet Take 1 tablet (112 mcg total) by mouth daily before breakfast. 90 tablet 0  . lisinopril (PRINIVIL,ZESTRIL) 40 MG tablet Take 1 tablet (40 mg total) by mouth daily. 90 tablet 1  . pantoprazole (PROTONIX) 40 MG tablet TAKE 1 TABLET BY MOUTH 2 TIMES DAILY. 60 tablet 3  . potassium gluconate 595 MG TABS tablet Take 595 mg by mouth daily.    . sertraline (ZOLOFT) 100 MG tablet Take 1 tablet (100 mg total) by mouth daily. 90 tablet 1  . Simethicone (GAS-X PO) Take 1 tablet by mouth every 6 (six) hours as needed (for flatulence).    . triamterene-hydrochlorothiazide (MAXZIDE-25) 37.5-25 MG per tablet Take 1 tablet by mouth daily. 90 tablet 1   No current facility-administered medications on file prior to visit.    Review of Systems  Respiratory: Negative for shortness of breath.   Cardiovascular: Negative for chest pain and leg swelling.  Psychiatric/Behavioral: Positive for sleep disturbance and dysphoric mood.       + guilt, insomnia.  Active SI.       Filed Vitals:   02/12/15  1612  BP: 157/84  Pulse: 86  Temp: 98.3 F (36.8 C)  TempSrc: Oral  Weight: 210 lb 1.6 oz (95.301 kg)  SpO2: 98%     Objective:   Physical Exam  Constitutional: She is oriented to person, place, and time. She appears well-developed. No distress.  HENT:  Head: Normocephalic and atraumatic.  Mouth/Throat: Oropharynx is clear and moist. No oropharyngeal exudate.  Eyes: EOM are normal. Pupils are equal, round, and reactive to light. Right eye exhibits no discharge. Left eye exhibits no discharge. No scleral icterus.  Neck: Neck supple.  Cardiovascular: Normal rate, regular rhythm and normal heart sounds.  Exam reveals no gallop and no friction rub.   No murmur heard. DPs not palpable.  B/L DPs heard via doppler (R>L).  Pulmonary/Chest: Effort normal and breath sounds normal. No  respiratory distress. She has no wheezes. She has no rales.  Abdominal: Soft. Bowel sounds are normal. She exhibits no distension. There is no tenderness. There is no rebound and no guarding.  Musculoskeletal: Normal range of motion. She exhibits no edema or tenderness.  Neurological: She is alert and oriented to person, place, and time. No cranial nerve deficit.  Skin: Skin is warm. She is not diaphoretic.  Psychiatric:  Flat affect, depressed mood.          Assessment & Plan:  Please see problem based charting for A&P.

## 2015-02-12 NOTE — ED Notes (Signed)
Pt sent here by [facility] for eval of increased depression and SI with attempt to take a "bunch of pills" last week that was unsuccessful; pt sts stopped depression meds x 1 month ago

## 2015-02-12 NOTE — ED Notes (Signed)
MD at bedside. 

## 2015-02-12 NOTE — Assessment & Plan Note (Addendum)
Assessment:  She has claudication with minimal exertion.  DP and PT pulses are heard with doppler.  She stopped her Plavix a few months ago due to severe depression.   Plan:  Currently working on acute issue of active SI.  Will refer patient back to Vascular (?eval for bypass surgery) after her mental health needs have been addressed.  She should resume anti-HTN meds, Plavix and high intensity statin (it appears Crestor was placed on allergy list in the past few months due to report of itch (however she had been on this med before, prior to Lipitor, without problem so I question the accuracy).  Longterm, she needs to quit smoking.

## 2015-02-12 NOTE — ED Notes (Signed)
RN unable to complete assessment. Pt states "I don't know you, I don't want to talk about it." RN asked pt if she is suicidal, pt states "Maybe, it's a possibility." Pt refusing to speak to RN further.

## 2015-02-12 NOTE — BH Assessment (Addendum)
Tele Assessment Note   [name] is an 65 y.o. female, widowed, African-American who presents unaccompanied to [facility] ED after being referred for mental health evaluation by her primary care physician, Duwaine Maxin, DO. Pt reports she had an appointment with Dr. Redmond Pulling today and he was concerned that she is severely depressed. Pt reports she has a long history of depression and anxiety and is currently receiving outpatient medication management with Dr. Norma Fredrickson. Pt reports her husband died in Jul 08, 2014 and that is when her depression and anxiety became more acute. Pt states she is the most depressed she has ever been. She says she doesn't want to kill herself but she doesn't want to live anymore. Pt reports symptoms including crying spells, social withdrawal, loss of interest in usual pleasures, fatigue, anhedonia, irritability, decreased concentration, decreased sleep, decreased appetite and feelings of hopelessness. She reports panic attacks whenever she encounters crowds of people so she avoids going out. Pt also says she has nightmares about being covered with bugs and is afraid to sleep. Pt says she isolates in her room, stares blankly at the television all day without paying attention to what she is watching and subsisting on potato chips and canned fruit. She reports a history of one suicide attempt by overdose when she was an adolescent, which Pt said she did because she felt unloved. She denies any homicidal ideation or history of violence. She denies any history of psychotic symptoms. She reports using marijuana, approximately one-half a joint once per week. She denies alcohol or other substance use; blood alcohol is less than five and urine drug screen is pending.  Pt identifies the death of herm husband as her primary stressor. She says after his death she could not afford her home and had to rent a room from a relative. She says the house is dirty and has an insect infestation,  which she finds very distressing. She has lost other family members and feels she has little support. Pt does have an adult daughter who lives in [place], [place]. She is currently prescribed Zoloft and Doxepin but says she ran out of the Doxepin and didn't bother to have it refilled. She has no history of inpatient mental health treatment. Pt states she has been in outpatient therapy and used to manage her depression and anxiety but since her husband died she has been unable to utilize her coping skills.  Pt is dressed in hospital scrubs, alert, oriented x4 with normal speech and normal motor behavior. Eye contact is good. Pt's mood is depressed and affect is congruent with mood. Thought process is coherent and relevant. There is no indication Pt is currently responding to internal stimuli or experiencing delusional thought content. Pt was calm and cooperative throughout assessment. She states she wants to get better and is willing to sign voluntarily into [facility].    Diagnosis: Major Depressive Disorder, Recurrent, Severe Without Psychotic Features; Generalized Anxiety Disorder  Past Medical History:  Past Medical History  Diagnosis Date  . Hypothyroidism   . Hypertension   . TIA (transient ischemic attack)   . PUD (peptic ulcer disease)   . HLD (hyperlipidemia)   . Depression   . GERD (gastroesophageal reflux disease)   . Diverticulosis   . PAD (peripheral artery disease) ([place])   . Anal fissure   . Anxiety   . Chronic headaches   . Cataract   . Glaucoma   . Allergy     Past Surgical History  Procedure  Laterality Date  . Total thyroidectomy    . Aortogram    . Iliac artery angioplasty and stenting Left 12/2012  . Sfa stent Left 2012    at [facility]  . Abdominal aortagram N/A 01/20/2013    Procedure: ABDOMINAL Maxcine Ham;  Surgeon: Conrad Gorham, MD;  Location: [facility] CATH LAB;  Service: Cardiovascular;  Laterality: N/A;  . Anal fissure repair    . Cataract extraction  Left   . Breast reduction surgery    . Stomach surgery      Family History:  Family History  Problem Relation Age of Onset  . Peripheral vascular disease Mother   . Heart disease Mother   . Hypertension Mother   . AAA (abdominal aortic aneurysm) Father   . Hypertension Father   . Uterine cancer Sister     mets to brain  . Liver cancer Brother   . Stomach cancer Maternal Aunt     x2  . Stomach cancer Paternal Aunt   . Heart disease Brother   . Kidney cancer Brother   . Anuerysm Brother     brain  . HIV Brother     x2  . Aneurysm Father     Social History:  reports that she has been smoking Cigarettes.  She has been smoking about 0.50 packs per day. She has never used smokeless tobacco. She reports that she uses illicit drugs about once per week. She reports that she does not drink alcohol.  Additional Social History:  Alcohol / Drug Use Pain Medications: Denies abuse Prescriptions: Denies abuse Over the Counter: Denies abuse History of alcohol / drug use?: Yes Longest period of sobriety (when/how long): NA Substance #1 Name of Substance 1: Marijuana 1 - Age of First Use: twenties 1 - Amount (size/oz): One half a joint 1 - Frequency: Average once per week 1 - Duration: Ongoing 1 - Last Use / Amount: 02/07/15  CIWA: CIWA-Ar Pulse Rate: 84 COWS:    PATIENT STRENGTHS: (choose at least two) Ability for insight Average or above average intelligence Capable of independent living Communication skills Financial means General fund of knowledge Motivation for treatment/growth Physical Health Work skills  Allergies:  Allergies  Allergen Reactions  . Asa [Aspirin] Nausea And Vomiting  . Crestor [Rosuvastatin Calcium] Itching  . Penicillins Hives and Itching    Has patient had a PCN reaction causing immediate rash, facial/tongue/throat swelling, SOB or lightheadedness with hypotension: Yes Has patient had a PCN reaction causing severe rash involving mucus membranes  or skin necrosis: Yes Has patient had a PCN reaction that required hospitalization No Has patient had a PCN reaction occurring within the last 10 years: No If all of the above answers are "NO", then may proceed with Cephalosporin use.   . Gadolinium Derivatives Other (See Comments)    Chest tightness    Home Medications:  (Not in a hospital admission)  OB/GYN Status:  No LMP recorded. Patient is postmenopausal.  General Assessment Data Location of Assessment: [facility] ED TTS Assessment: In system Is this a Tele or Face-to-Face Assessment?: Tele Assessment Is this an Initial Assessment or a Re-assessment for this encounter?: Initial Assessment Marital status: Widowed [place] name: NA Is patient pregnant?: No Pregnancy Status: No Living Arrangements: Spouse/significant other, Other (Comment) (Lives in a room in a relative's house) Can pt return to current living arrangement?: Yes Admission Status: Voluntary Is patient capable of signing voluntary admission?: Yes Referral Source: Other (PCP- Duwaine Maxin, MD) Insurance type: [facility]  Crisis Care Plan Living Arrangements: Spouse/significant other, Other (Comment) (Lives in a room in a relative's house) Legal Guardian: Other: (None) Name of Psychiatrist: Norma Fredrickson, MD Name of Therapist: None  Education Status Is patient currently in school?: No Current Grade: NA Highest grade of school patient has completed: Some college Name of school: NA Contact person: NA  Risk to self with the past 6 months Suicidal Ideation: Yes-Currently Present Has patient been a risk to self within the past 6 months prior to admission? : Yes Suicidal Intent: No Has patient had any suicidal intent within the past 6 months prior to admission? : No Is patient at risk for suicide?: Yes Suicidal Plan?: No Has patient had any suicidal plan within the past 6 months prior to admission? : No Access to Means: No What has been your use of drugs/alcohol  within the last 12 months?: Pt reports marijuana use Previous Attempts/Gestures: Yes How many times?: 1 (Overdosed as an adolescent) Other Self Harm Risks: None Triggers for Past Attempts: Other (Comment) (Feeling unloved) Intentional Self Injurious Behavior: None Family Suicide History: No Recent stressful life event(s): Loss (Comment) (Husband died 05/04/14) Persecutory voices/beliefs?: No Depression: Yes Depression Symptoms: Despondent, Tearfulness, Isolating, Fatigue, Guilt, Loss of interest in usual pleasures, Feeling worthless/self pity, Feeling angry/irritable, Insomnia Substance abuse history and/or treatment for substance abuse?: Yes Suicide prevention information given to non-admitted patients: Not applicable  Risk to Others within the past 6 months Homicidal Ideation: No Does patient have any lifetime risk of violence toward others beyond the six months prior to admission? : No Thoughts of Harm to Others: No Current Homicidal Intent: No Current Homicidal Plan: No Access to Homicidal Means: No Identified Victim: None History of harm to others?: No Assessment of Violence: None Noted Violent Behavior Description: Pt denies history of violence Does patient have access to weapons?: No Criminal Charges Pending?: No Does patient have a court date: No Is patient on probation?: No  Psychosis Hallucinations: None noted Delusions: None noted  Mental Status Report Appearance/Hygiene: In scrubs Eye Contact: Good Motor Activity: Unremarkable Speech: Logical/coherent Level of Consciousness: Alert Mood: Depressed Affect: Depressed Anxiety Level: Panic Attacks Panic attack frequency: Average once per week Most recent panic attack: Today Thought Processes: Coherent, Relevant Judgement: Unimpaired Orientation: Person, Place, Time, Situation, Appropriate for developmental age Obsessive Compulsive Thoughts/Behaviors: None  Cognitive Functioning Concentration: Normal Memory:  Recent Intact, Remote Intact IQ: Average Insight: Good Impulse Control: Good Appetite: Poor Weight Loss: 5 Weight Gain: 0 Sleep: Decreased Total Hours of Sleep: 5 Vegetative Symptoms: None  ADLScreening [facility] Assessment Services) Patient's cognitive ability adequate to safely complete daily activities?: Yes Patient able to express need for assistance with ADLs?: Yes Independently performs ADLs?: Yes (appropriate for developmental age)  Prior Inpatient Therapy Prior Inpatient Therapy: No Prior Therapy Dates: NA Prior Therapy Facilty/Provider(s): NA Reason for Treatment: NA  Prior Outpatient Therapy Prior Outpatient Therapy: Yes Prior Therapy Dates: Current Prior Therapy Facilty/Provider(s): Norma Fredrickson, MD Reason for Treatment: Depression, anxiety Does patient have an ACCT team?: No Does patient have Intensive In-House Services?  : No Does patient have [facility] services? : No Does patient have P4CC services?: No  ADL Screening (condition at time of admission) Patient's cognitive ability adequate to safely complete daily activities?: Yes Is the patient deaf or have difficulty hearing?: No Does the patient have difficulty seeing, even when wearing glasses/contacts?: No Does the patient have difficulty concentrating, remembering, or making decisions?: No Patient able to express need for assistance with ADLs?: Yes  Does the patient have difficulty dressing or bathing?: No Independently performs ADLs?: Yes (appropriate for developmental age) Does the patient have difficulty walking or climbing stairs?: No Weakness of Legs: None Weakness of Arms/Hands: None       Abuse/Neglect Assessment (Assessment to be complete while patient is alone) Physical Abuse: Denies Verbal Abuse: Denies Sexual Abuse: Denies Exploitation of patient/patient's resources: Denies Self-Neglect: Denies     Regulatory affairs officer (For Healthcare) Does patient have an advance directive?: No Would  patient like information on creating an advanced directive?: No - patient declined information    Additional Information 1:1 In Past 12 Months?: No CIRT Risk: No Elopement Risk: No Does patient have medical clearance?: Yes     Disposition: Tad Moore, AC at [facility], confirmed bed availability. Gave clinical report to Patriciaann Clan, PA-C who said Pt meets criteria for inpatient psychiatric treatment and accepted Pt to the service of Dr. Wanda Plump. [place], room 403-1. Inocencio Homes said room will be available after midnight. Notified Dr. Tanna Furry and Katrinka Blazing, RN of acceptance.  Disposition Initial Assessment Completed for this Encounter: Yes Disposition of Patient: Inpatient treatment program Type of inpatient treatment program: Adult   Evelena Peat, [facility], [facility] Triage Specialist [phone]   Evelena Peat 02/12/2015 9:14 PM

## 2015-02-12 NOTE — Assessment & Plan Note (Addendum)
Assessment:  Patient is actively suicidal and has been off of Zoloft and Doxepin for about 2 months due to not feeling like taking meds.  She says she does have them at home.  She no showed visit with psychiatrist in October and has not rescheduled.  She will not contract for safety today and does not have close support system (daughter lives out of town).  It seems that current living situation.  Living with distant relative for past few months since she lost her house.  She says the home is filthy and there are roaches.  She feels this has made her depression worse.  While I am encouraged that she showed up for today's appointment, she appears depressed and is telling me she wants to kill herself so she warrants immediate psych eval for [place] inpatient admission.   Plan:  Patient was escorted to [facility] ED for triage and evaluation for inpatient psych due to active SI.  She agreed to this plan.  Her other medical conditions are stable and can be dealt with on outpatient basis once her active SI has been evaluated and appropriately treated by psych professional.

## 2015-02-12 NOTE — ED Notes (Signed)
TTS at bedside. 

## 2015-02-12 NOTE — Patient Instructions (Addendum)
1. I am sending you to the Emergency Room so you can get help with your severe depression symptoms.  My hope is that they will be able to get you evaluated and sent to [facility] for further treatment. I will refer you back to Dr. Kellie Simmering for your leg pain.  Please restart your Plavix and other medications.   2. Please take all medications as prescribed.    3. If you have worsening of your symptoms or new symptoms arise, please call the clinic [phone]), or go to the ER immediately if symptoms are severe.

## 2015-02-13 ENCOUNTER — Inpatient Hospital Stay (HOSPITAL_COMMUNITY)
Admission: AD | Admit: 2015-02-13 | Discharge: 2015-02-21 | DRG: 885 | Disposition: A | Discharge status: Home / Self Care | Payer: Commercial Managed Care - HMO | Source: Intra-hospital | Attending: Psychiatry | Admitting: Psychiatry

## 2015-02-13 ENCOUNTER — Encounter (HOSPITAL_COMMUNITY): Payer: Self-pay

## 2015-02-13 DIAGNOSIS — Z8673 Personal history of transient ischemic attack (TIA), and cerebral infarction without residual deficits: Secondary | ICD-10-CM | POA: Diagnosis not present

## 2015-02-13 DIAGNOSIS — F332 Major depressive disorder, recurrent severe without psychotic features: Principal | ICD-10-CM | POA: Diagnosis present

## 2015-02-13 DIAGNOSIS — Z7902 Long term (current) use of antithrombotics/antiplatelets: Secondary | ICD-10-CM

## 2015-02-13 DIAGNOSIS — R45851 Suicidal ideations: Secondary | ICD-10-CM | POA: Diagnosis present

## 2015-02-13 DIAGNOSIS — Z830 Family history of human immunodeficiency virus [HIV] disease: Secondary | ICD-10-CM

## 2015-02-13 DIAGNOSIS — Z8049 Family history of malignant neoplasm of other genital organs: Secondary | ICD-10-CM

## 2015-02-13 DIAGNOSIS — K219 Gastro-esophageal reflux disease without esophagitis: Secondary | ICD-10-CM | POA: Diagnosis present

## 2015-02-13 DIAGNOSIS — F4001 Agoraphobia with panic disorder: Secondary | ICD-10-CM | POA: Diagnosis not present

## 2015-02-13 DIAGNOSIS — Z8051 Family history of malignant neoplasm of kidney: Secondary | ICD-10-CM | POA: Diagnosis not present

## 2015-02-13 DIAGNOSIS — Z79899 Other long term (current) drug therapy: Secondary | ICD-10-CM

## 2015-02-13 DIAGNOSIS — Z8249 Family history of ischemic heart disease and other diseases of the circulatory system: Secondary | ICD-10-CM | POA: Diagnosis not present

## 2015-02-13 DIAGNOSIS — I739 Peripheral vascular disease, unspecified: Secondary | ICD-10-CM | POA: Diagnosis present

## 2015-02-13 DIAGNOSIS — G4700 Insomnia, unspecified: Secondary | ICD-10-CM | POA: Diagnosis not present

## 2015-02-13 DIAGNOSIS — E039 Hypothyroidism, unspecified: Secondary | ICD-10-CM | POA: Diagnosis not present

## 2015-02-13 DIAGNOSIS — H409 Unspecified glaucoma: Secondary | ICD-10-CM | POA: Diagnosis not present

## 2015-02-13 DIAGNOSIS — F411 Generalized anxiety disorder: Secondary | ICD-10-CM | POA: Diagnosis not present

## 2015-02-13 DIAGNOSIS — E785 Hyperlipidemia, unspecified: Secondary | ICD-10-CM | POA: Diagnosis present

## 2015-02-13 DIAGNOSIS — Z823 Family history of stroke: Secondary | ICD-10-CM | POA: Diagnosis not present

## 2015-02-13 DIAGNOSIS — F17210 Nicotine dependence, cigarettes, uncomplicated: Secondary | ICD-10-CM | POA: Diagnosis present

## 2015-02-13 DIAGNOSIS — I10 Essential (primary) hypertension: Secondary | ICD-10-CM | POA: Diagnosis not present

## 2015-02-13 MED ORDER — AMLODIPINE BESYLATE 5 MG PO TABS
5.0000 mg | ORAL_TABLET | Freq: Every day | ORAL | Status: DC
  Filled 2015-02-13 (×2): qty 1

## 2015-02-13 MED ORDER — DOXEPIN HCL 25 MG PO CAPS: 25.0000 mg | ORAL_CAPSULE | Freq: Every evening | ORAL | Status: DC | PRN

## 2015-02-13 MED ORDER — AMLODIPINE BESYLATE 10 MG PO TABS: 10.0000 mg | ORAL_TABLET | Freq: Every day | ORAL | Status: DC

## 2015-02-13 MED ORDER — AMLODIPINE BESYLATE 5 MG PO TABS
5.0000 mg | ORAL_TABLET | Freq: Every day | ORAL | Status: DC
  Administered 2015-02-13 – 2015-02-20 (×8): 5 mg via ORAL
  Filled 2015-02-13 (×10): qty 1

## 2015-02-13 MED ORDER — MIRTAZAPINE 15 MG PO TABS
7.5000 mg | ORAL_TABLET | Freq: Every day | ORAL | Status: DC
  Administered 2015-02-13: 15 mg via ORAL
  Filled 2015-02-13 (×2): qty 0.5
  Filled 2015-02-13: qty 1
  Filled 2015-02-13: qty 0.5

## 2015-02-13 MED ORDER — SERTRALINE HCL 50 MG PO TABS
50.0000 mg | ORAL_TABLET | Freq: Every day | ORAL | Status: DC
Start: 2015-02-13 — End: 2015-02-15
  Administered 2015-02-13 – 2015-02-15 (×3): 50 mg via ORAL
  Filled 2015-02-13 (×4): qty 1

## 2015-02-13 MED ORDER — LISINOPRIL 40 MG PO TABS
40.0000 mg | ORAL_TABLET | Freq: Every day | ORAL | Status: DC
  Administered 2015-02-13 – 2015-02-21 (×9): 40 mg via ORAL
  Filled 2015-02-13 (×11): qty 1

## 2015-02-13 MED ORDER — AMLODIPINE BESYLATE 5 MG PO TABS: 5.0000 mg | ORAL_TABLET | Freq: Every day | ORAL | Status: DC

## 2015-02-13 MED ORDER — DOXEPIN HCL 25 MG PO CAPS
25.0000 mg | ORAL_CAPSULE | Freq: Every evening | ORAL | Status: DC | PRN
  Administered 2015-02-13: 25 mg via ORAL
  Filled 2015-02-13 (×6): qty 1

## 2015-02-13 MED ORDER — MAGNESIUM HYDROXIDE 400 MG/5ML PO SUSP
30.0000 mL | Freq: Every day | ORAL | Status: DC | PRN
  Administered 2015-02-18: 30 mL via ORAL
  Filled 2015-02-13: qty 30

## 2015-02-13 MED ORDER — LEVOTHYROXINE SODIUM 112 MCG PO TABS
112.0000 ug | ORAL_TABLET | Freq: Every day | ORAL | Status: DC
  Administered 2015-02-13 – 2015-02-21 (×9): 112 ug via ORAL
  Filled 2015-02-13 (×12): qty 1

## 2015-02-13 MED ORDER — ACETAMINOPHEN 500 MG PO TABS
1000.0000 mg | ORAL_TABLET | Freq: Four times a day (QID) | ORAL | Status: DC | PRN
  Administered 2015-02-13 – 2015-02-15 (×5): 1000 mg via ORAL
  Filled 2015-02-13 (×5): qty 2

## 2015-02-13 MED ORDER — HYDROXYZINE HCL 25 MG PO TABS
25.0000 mg | ORAL_TABLET | Freq: Four times a day (QID) | ORAL | Status: DC | PRN
  Administered 2015-02-13 – 2015-02-21 (×20): 25 mg via ORAL
  Filled 2015-02-13 (×20): qty 1

## 2015-02-13 MED ORDER — CLOPIDOGREL BISULFATE 75 MG PO TABS
75.0000 mg | ORAL_TABLET | Freq: Every day | ORAL | Status: DC
  Administered 2015-02-13 – 2015-02-21 (×9): 75 mg via ORAL
  Filled 2015-02-13 (×11): qty 1

## 2015-02-13 MED ORDER — ALUM & MAG HYDROXIDE-SIMETH 200-200-20 MG/5ML PO SUSP
30.0000 mL | ORAL | Status: DC | PRN
  Administered 2015-02-14: 30 mL via ORAL
  Filled 2015-02-13: qty 30

## 2015-02-13 MED ORDER — PANTOPRAZOLE SODIUM 40 MG PO TBEC
40.0000 mg | DELAYED_RELEASE_TABLET | Freq: Every day | ORAL | Status: DC
  Administered 2015-02-13 – 2015-02-21 (×9): 40 mg via ORAL
  Filled 2015-02-13 (×11): qty 1

## 2015-02-13 MED ORDER — CYCLOBENZAPRINE HCL 10 MG PO TABS
10.0000 mg | ORAL_TABLET | Freq: Three times a day (TID) | ORAL | Status: DC | PRN
  Administered 2015-02-13 – 2015-02-21 (×16): 10 mg via ORAL
  Filled 2015-02-13 (×16): qty 1

## 2015-02-13 NOTE — Progress Notes (Signed)
Adult Psychoeducational Group Note  Date:  02/13/2015 Time:  8:29 PM  Group Topic/Focus:  Wrap-Up Group:   The focus of this group is to help patients review their daily goal of treatment and discuss progress on daily workbooks.  Participation Level:  Did Not Attend  Pt was present in dayroom at the beginning of wrap-up group, but left as soon as the group started. Pt appeared drowsy.   Lincoln Brigham 02/13/2015, 9:16 PM

## 2015-02-13 NOTE — [facility] Group Notes (Signed)
[place] Group Notes: (Nursing/MHT/Case Management/Adjunct)  Date: 02/13/2015  Time: 10:41 AM  Type of Therapy: Psychoeducational Skills  Participation Level: Did Not Attend  Participation Quality: did not attend  Affect: N/A  Cognitive: N/A  Insight: None  Engagement in Group: None  Modes of Intervention: N/A  Summary of Progress/Problems: Patient invited to group but did not attend.

## 2015-02-13 NOTE — Treatment Plan (Signed)
Interdisciplinary Treatment Plan Update (Adult) Date: 02/13/2015    Time Reviewed: 9:30 AM  Progress in Treatment: Attending groups: Continuing to assess, patient new to milieu Participating in groups: Continuing to assess, patient new to milieu Taking medication as prescribed: Yes Tolerating medication: Yes Family/Significant other contact made: No, CSW assessing for appropriate contacts Patient understands diagnosis: Yes Discussing patient identified problems/goals with staff: Yes Medical problems stabilized or resolved: Yes Denies suicidal/homicidal ideation: Yes Issues/concerns per patient self-inventory: Yes Other:  New problem(s) identified: N/A  Discharge Plan or Barriers: CSW continuing to assess, patient new to milieu.  Reason for Continuation of Hospitalization:  Depression Anxiety Medication Stabilization   Comments: N/A  Estimated length of stay: 3-5 days    Patient is a 65 year old female admitted for depression and anxiety. Pt reports primary trigger(s) for admission are housing situation and grief. Patient will benefit from crisis stabilization, medication evaluation, group therapy and psycho education in addition to case management for discharge planning. At discharge, it is recommended that Pt remain compliant with established discharge plan and continued treatment.   Review of initial/current patient goals per problem list:  1. Goal(s): Patient will participate in aftercare plan   Met: Yes   Target date: 3-5 days post admission date   As evidenced by: Patient will participate within aftercare plan AEB aftercare provider and housing plan at discharge being identified.  1/31: Goal met. Patient plans to return home to follow up with outpatient services.    2. Goal (s): Patient will exhibit decreased depressive symptoms and suicidal ideations.   Met: No   Target date: 3-5 days post admission date   As evidenced by: Patient will utilize self  rating of depression at 3 or below and demonstrate decreased signs of depression or be deemed stable for discharge by MD.  1/31: Goal not met: Pt presents with flat affect and depressed mood.  Pt admitted with depression rating of 10.  Pt to show decreased sign of depression and a rating of 3 or less before d/c.       3. Goal(s): Patient will demonstrate decreased signs and symptoms of anxiety.   Met: No   Target date: 3-5 days post admission date   As evidenced by: Patient will utilize self rating of anxiety at 3 or below and demonstrated decreased signs of anxiety, or be deemed stable for discharge by MD  1/31: Goal not met: Pt presents with anxious mood and affect.  Pt admitted with anxiety rating of 10.  Pt to show decreased sign of anxiety and a rating of 3 or less before d/c.   Attendees: Patient:    Family:    Physician: Dr. Parke Poisson; Dr. Shea Evans 02/13/2015 9:30 AM  Nursing: Franco Nones, Marcella Dubs, Roni Lissa Hoard, RN 02/13/2015 9:30 AM  Clinical Social Worker: Tilden Fossa, LCSW 02/13/2015 9:30 AM  Other: Peri Maris, LCSWA; [place], LCSW  02/13/2015 9:30 AM   02/13/2015 9:30 AM  Other: Lars Pinks, Case Manager 02/13/2015 9:30 AM  Other: Gillie Manners , NP 02/13/2015 9:30 AM    Scribe for Treatment Team:  Tilden Fossa, [place]

## 2015-02-13 NOTE — Progress Notes (Signed)
Patient ID: [name], female   DOB: Jul 23, 1950, 65 y.o.   MRN: [id number]  DAR: Pt. Denies SI/HI and A/V Hallucinations. She reports sleep is fair, appetite is fair, energy level is low, and concentration is poor. She is somewhat irritable this morning stating, "I don't want to be bothered."  She rates her depression, anxiety, and hopelessness 9/10. Patient does report pain and is receiving PRN medication to relieve this. Support and encouragement provided to the patient. Patient able to make her needs known Scheduled medications administered to patient per physician's orders however states, "I don't know why I have to take all these pills." Patient remains in her room throughout the day but will come to medication window for her meds and go down for meals. Q15 minute checks are maintained for safety.

## 2015-02-13 NOTE — Plan of Care (Signed)
Problem: Diagnosis: Increased Risk For Suicide Attempt Goal: STG-Patient Will Comply With Medication Regime Outcome: Progressing Patient is compliant with scheduled medications.     

## 2015-02-13 NOTE — Tx Team (Signed)
Initial Interdisciplinary Treatment Plan   PATIENT STRESSORS: Financial difficulties Health problems Loss of husband Medication change or noncompliance   PATIENT STRENGTHS: Ability for insight Active sense of humor Average or above average intelligence General fund of knowledge Motivation for treatment/growth Religious Affiliation   PROBLEM LIST: Problem List/Patient Goals Date to be addressed Date deferred Reason deferred Estimated date of resolution  Housing issue (bug infested living conditions) 02/13/15     Risk for suicide 02/13/15     depression 02/13/15     "ability to be around people, get self confidence back" 02/13/15     finances 02/13/15     Medication compliance 02/13/15                              DISCHARGE CRITERIA:  Adequate post-discharge living arrangements Improved stabilization in mood, thinking, and/or behavior Verbal commitment to aftercare and medication compliance  PRELIMINARY DISCHARGE PLAN: Attend aftercare/continuing care group Outpatient therapy Placement in alternative living arrangements  PATIENT/FAMIILY INVOLVEMENT: This treatment plan has been presented to and reviewed with the patient, [name].  The patient and family have been given the opportunity to ask questions and make suggestions.  Vonzella Nipple A 02/13/2015, 5:19 AM

## 2015-02-13 NOTE — [facility] Group Notes (Signed)
Interdisciplinary Treatment Plan Update (Adult) Date: 02/13/2015    Time Reviewed: 9:30 AM  Progress in Treatment: Attending groups: Continuing to assess, patient new to milieu Participating in groups: Continuing to assess, patient new to milieu Taking medication as prescribed: Yes Tolerating medication: Yes Family/Significant other contact made: No, CSW assessing for appropriate contacts Patient understands diagnosis: Yes Discussing patient identified problems/goals with staff: Yes Medical problems stabilized or resolved: Yes Denies suicidal/homicidal ideation: Yes Issues/concerns per patient self-inventory: Yes Other:  New problem(s) identified: N/A  Discharge Plan or Barriers: CSW continuing to assess, patient new to milieu.  Reason for Continuation of Hospitalization:  Depression Anxiety Medication Stabilization   Comments: N/A  Estimated length of stay: 3-5 days    Patient is a 65 year old female admitted for depression and anxiety. Pt reports primary trigger(s) for admission are housing situation and grief. Patient will benefit from crisis stabilization, medication evaluation, group therapy and psycho education in addition to case management for discharge planning. At discharge, it is recommended that Pt remain compliant with established discharge plan and continued treatment.   Review of initial/current patient goals per problem list:  1. Goal(s): Patient will participate in aftercare plan   Met: Yes   Target date: 3-5 days post admission date   As evidenced by: Patient will participate within aftercare plan AEB aftercare provider and housing plan at discharge being identified.  1/31: Goal met. Patient plans to return home to follow up with outpatient services.    2. Goal (s): Patient will exhibit decreased depressive symptoms and suicidal ideations.   Met: No   Target date: 3-5 days post admission date   As evidenced by: Patient will utilize self  rating of depression at 3 or below and demonstrate decreased signs of depression or be deemed stable for discharge by MD.  1/31: Goal not met: Pt presents with flat affect and depressed mood.  Pt admitted with depression rating of 10.  Pt to show decreased sign of depression and a rating of 3 or less before d/c.       3. Goal(s): Patient will demonstrate decreased signs and symptoms of anxiety.   Met: No   Target date: 3-5 days post admission date   As evidenced by: Patient will utilize self rating of anxiety at 3 or below and demonstrated decreased signs of anxiety, or be deemed stable for discharge by MD  1/31: Goal not met: Pt presents with anxious mood and affect.  Pt admitted with anxiety rating of 10.  Pt to show decreased sign of anxiety and a rating of 3 or less before d/c.   Attendees: Patient:    Family:    Physician: Dr. Parke Poisson; Dr. Shea Evans 02/13/2015 9:30 AM  Nursing: Franco Nones, Marcella Dubs, Roni Lissa Hoard, RN 02/13/2015 9:30 AM  Clinical Social Worker: Tilden Fossa, LCSW 02/13/2015 9:30 AM  Other: Peri Maris, LCSWA; [place], LCSW  02/13/2015 9:30 AM   02/13/2015 9:30 AM  Other: Lars Pinks, Case Manager 02/13/2015 9:30 AM  Other: Gillie Manners , NP 02/13/2015 9:30 AM    Scribe for Treatment Team:  Tilden Fossa, [place]

## 2015-02-13 NOTE — Progress Notes (Signed)
Patient ID: [name], female   DOB: 1950/11/17, 65 y.o.   MRN: [id number]  Admission Note:  D:64 yr female who presents [facility] in no acute distress for the treatment of SI and Depression. Pt appears flat and depressed. Pt was calm and cooperative with admission process. Pt presents with passive SI and contracts for safety upon admission. Pt denies AVH . Pt does not have means or desire, but pt doesn't care if she lives or dies. Pt stated since her husband's death in 2014/06/19 her life has been de-compensating. Pt has been able to control mental issues in the past but now, " I can't work, live in an awful situation, have nightmares about roaches, I can't deal with crowds, I have extreme claustrophobia, I have episodes- little seizures that i have uncontrolled muscle movement then I usually fall down.    A:Skin was assessed and found to be clear of any abnormal marks per nurse assessment. PT searched and no contraband found, POC and unit policies explained and understanding verbalized. Consents obtained. Food and fluids offered, and  accepted.   R:Pt had no additional questions or concerns.

## 2015-02-13 NOTE — H&P (Signed)
Psychiatric Admission Assessment Adult  Patient Identification: [name] MRN:  [id number] Date of Evaluation:  02/13/2015 Chief Complaint:   " I have been depressed " Principal Diagnosis:  Major Depression, Recurrent, no Psychotic Features, Severe  Diagnosis:   Patient Active Problem List   Diagnosis Date Noted  . MDD (major depressive disorder), recurrent episode, severe ([place]) [F33.2] 02/13/2015  . Dysphagia [R13.10] 10/26/2014  . New onset of headaches after age 24 [R51] 09/19/2014  . Pulsatile tinnitus of left ear [id number]] 09/19/2014  . Blurry vision [H53.8] 09/19/2014  . Excessive daytime sleepiness [G47.19] 09/19/2014  . Morbid obesity ([place]) [E66.01] 09/19/2014  . Nocturnal headaches [R51] 09/19/2014  . Left-sided weakness [M62.89] 04/20/2014  . Tobacco use disorder [F17.200] 04/19/2014  . Blood in stool [K92.1] 01/30/2013  . Hematuria, gross [R31.0] 01/30/2013  . Intermittent claudication ([place]) [I73.9] 01/20/2013  . Eye mass [H57.8] 12/13/2012  . Cataract [H26.9] 12/13/2012  . Recurrent headache [R51] 12/13/2012  . Major depressive disorder, recurrent episode, severe ([place]) [F33.2] 10/29/2012  . Anxiety state, unspecified [F41.1] 09/20/2012  . Abnormality of gait [R26.9] 04/21/2012  . PAD (peripheral artery disease) ([place]) [I73.9] 04/15/2012  . Hyperlipidemia [E78.5] 04/15/2012  . Hypertension [I10] 04/15/2012  . Hypothyroidism [E03.9] 04/15/2012  . GERD (gastroesophageal reflux disease) [K21.9] 04/15/2012  . Routine health maintenance [Z00.00] 04/15/2012   History of Present Illness:: 65 year old female. States she has history of depression, which has been getting worse over recent months, particularly after her husband passed away in 06/25/14.  States she has had passive thoughts of being better off dead, and " wanting to die". She has had recent thoughts of walking into traffic .  She states she had been on psychiatric medications - Zoloft and Doxepin- but had  stopped taking them after the death of her husband . In addition to depression, reports history of panic attacks and agoraphobia.  Associated Signs/Symptoms: Depression Symptoms:  depressed mood, anhedonia, insomnia, recurrent thoughts of death, suicidal thoughts with specific plan, loss of energy/fatigue, decreased appetite, has lost some weight over recent months  (Hypo) Manic Symptoms:  Denies  Anxiety Symptoms:  Has had panic attacks, agoraphobia. Psychotic Symptoms:   Denies  PTSD Symptoms: Denies  Total Time spent with patient: 45 minutes  Past Psychiatric History: this is first psychiatric admission, states she had one suicidal attempt as a teenager by overdosing on medications, no history of mania, no history of psychosis. States she has had a history of panic disorder and agoraphobia. As noted, states her depression has worsened after her husband passed away last year. Not currently seeing any psychiatrist or therapist, stopped taking Zoloft several months ago.   Risk to Self: Is patient at risk for suicide?: Yes Risk to Others:   Prior Inpatient Therapy:   Prior Outpatient Therapy:    Alcohol Screening: 1. How often do you have a drink containing alcohol?: Monthly or less 2. How many drinks containing alcohol do you have on a typical day when you are drinking?: 1 or 2 3. How often do you have six or more drinks on one occasion?: Never Preliminary Score: 0 9. Have you or someone else been injured as a result of your drinking?: No 10. Has a relative or friend or a doctor or another health worker been concerned about your drinking or suggested you cut down?: No Alcohol Use Disorder Identification Test Final Score (AUDIT): 1 Brief Intervention: AUDIT score less than 7 or less-screening does not suggest unhealthy drinking-brief intervention not  indicated Substance Abuse History in the last 12 months:  Denies alcohol or drug abuse, states she does smoke cannabis occasionally ,  last used three weeks ago. Consequences of Substance Abuse: Denies  Previous Psychotropic Medications: Zoloft and Doxepin in the past . Diazepam in the past . As noted, states she has not been taking any medications in several  Months  Psychological Evaluations:  No  Past Medical History:  Smokes 1/2 PPD .  Past Medical History  Diagnosis Date  . Hypothyroidism   . Hypertension   . TIA (transient ischemic attack)   . PUD (peptic ulcer disease)   . HLD (hyperlipidemia)   . Depression   . GERD (gastroesophageal reflux disease)   . Diverticulosis   . PAD (peripheral artery disease) ([place])   . Anal fissure   . Anxiety   . Chronic headaches   . Cataract   . Glaucoma   . Allergy     Past Surgical History  Procedure Laterality Date  . Total thyroidectomy    . Aortogram    . Iliac artery angioplasty and stenting Left 12/2012  . Sfa stent Left 2012    at [facility]  . Abdominal aortagram N/A 01/20/2013    Procedure: ABDOMINAL Maxcine Ham;  Surgeon: Conrad Oconee, MD;  Location: [facility] CATH LAB;  Service: Cardiovascular;  Laterality: N/A;  . Anal fissure repair    . Cataract extraction Left   . Breast reduction surgery    . Stomach surgery     Family History:  Parents passed away, had 4 brothers, 2 sisters , states " all of them have passed away". States family members have died of MI, CNS aneurysms, and CVA.   Family History  Problem Relation Age of Onset  . Peripheral vascular disease Mother   . Heart disease Mother   . Hypertension Mother   . AAA (abdominal aortic aneurysm) Father   . Hypertension Father   . Uterine cancer Sister     mets to brain  . Liver cancer Brother   . Stomach cancer Maternal Aunt     x2  . Stomach cancer Paternal Aunt   . Heart disease Brother   . Kidney cancer Brother   . Anuerysm Brother     brain  . HIV Brother     x2  . Aneurysm Father    Family Psychiatric  History:  Denies history of mental illness in family. States several  family members were alcoholic, denies suicides in family Social History: widowed, living with a cousin, states her home situation is a significant stressors " the house is dirty, there are roaches everywhere ".  She is on Disability, no legal issues, one daughter .  History  Alcohol Use No     History  Drug Use  . 1.00 per week  . Special: Cocaine, Marijuana    Comment: marijuana    Social History   Social History  . Marital Status: Widowed    Spouse Name: N/A  . Number of Children: 1  . Years of Education: 12   Occupational History  . Disabled     Social History Main Topics  . Smoking status: Current Every Day Smoker -- 0.50 packs/day for .5 years    Types: Cigarettes  . Smokeless tobacco: Never Used  . Alcohol Use: No  . Drug Use: 1.00 per week    Special: Cocaine, Marijuana     Comment: marijuana  . Sexual Activity: No   Other Topics  Concern  . None   Social History Narrative   Live at home alone.   Caffeine use: Drinks coffee once/day   Soda- 2-3 times per day      Additional Social History:  Allergies:   Allergies  Allergen Reactions  . Asa [Aspirin] Nausea And Vomiting  . Crestor [Rosuvastatin Calcium] Itching  . Penicillins Hives and Itching    Has patient had a PCN reaction causing immediate rash, facial/tongue/throat swelling, SOB or lightheadedness with hypotension: Yes Has patient had a PCN reaction causing severe rash involving mucus membranes or skin necrosis: Yes Has patient had a PCN reaction that required hospitalization No Has patient had a PCN reaction occurring within the last 10 years: No If all of the above answers are "NO", then may proceed with Cephalosporin use.   . Gadolinium Derivatives Other (See Comments)    Chest tightness   Lab Results:  Results for orders placed or performed during the hospital encounter of 02/12/15 (from the past 48 hour(s))  Comprehensive metabolic panel     Status: Abnormal   Collection Time: 02/12/15   5:45 PM  Result Value Ref Range   Sodium 142 135 - 145 mmol/L   Potassium 4.7 3.5 - 5.1 mmol/L   Chloride 106 101 - 111 mmol/L   CO2 23 22 - 32 mmol/L   Glucose, Bld 100 (H) 65 - 99 mg/dL   BUN 12 6 - 20 mg/dL   Creatinine, Ser 1.27 (H) 0.44 - 1.00 mg/dL   Calcium 9.7 8.9 - 10.3 mg/dL   Total Protein 7.1 6.5 - 8.1 g/dL   Albumin 4.0 3.5 - 5.0 g/dL   AST 29 15 - 41 U/L   ALT 16 14 - 54 U/L   Alkaline Phosphatase 95 38 - 126 U/L   Total Bilirubin 0.8 0.3 - 1.2 mg/dL   GFR calc non Af Amer 43 (L) >60 mL/min   GFR calc Af Amer 50 (L) >60 mL/min    Comment: (NOTE) The eGFR has been calculated using the CKD EPI equation. This calculation has not been validated in all clinical situations. eGFR's persistently <60 mL/min signify possible Chronic Kidney Disease.    Anion gap 13 5 - 15  Ethanol (ETOH)     Status: None   Collection Time: 02/12/15  5:45 PM  Result Value Ref Range   Alcohol, Ethyl (B) <5 <5 mg/dL    Comment:        LOWEST DETECTABLE LIMIT FOR SERUM ALCOHOL IS 5 mg/dL FOR MEDICAL PURPOSES ONLY   Salicylate level     Status: None   Collection Time: 02/12/15  5:45 PM  Result Value Ref Range   Salicylate Lvl <4.0 2.8 - 30.0 mg/dL  Acetaminophen level     Status: Abnormal   Collection Time: 02/12/15  5:45 PM  Result Value Ref Range   Acetaminophen (Tylenol), Serum <10 (L) 10 - 30 ug/mL    Comment:        THERAPEUTIC CONCENTRATIONS VARY SIGNIFICANTLY. A RANGE OF 10-30 ug/mL MAY BE AN EFFECTIVE CONCENTRATION FOR MANY PATIENTS. HOWEVER, SOME ARE BEST TREATED AT CONCENTRATIONS OUTSIDE THIS RANGE. ACETAMINOPHEN CONCENTRATIONS >150 ug/mL AT 4 HOURS AFTER INGESTION AND >50 ug/mL AT 12 HOURS AFTER INGESTION ARE OFTEN ASSOCIATED WITH TOXIC REACTIONS.   CBC     Status: None   Collection Time: 02/12/15  5:45 PM  Result Value Ref Range   WBC 6.2 4.0 - 10.5 K/uL   RBC 4.72 3.87 - 5.11 MIL/uL   Hemoglobin  14.8 12.0 - 15.0 g/dL   HCT 44.7 36.0 - 46.0 %   MCV 94.7 78.0 -  100.0 fL   MCH 31.4 26.0 - 34.0 pg   MCHC 33.1 30.0 - 36.0 g/dL   RDW 13.7 11.5 - 15.5 %   Platelets 171 150 - 008 K/uL    Metabolic Disorder Labs:  Lab Results  Component Value Date   HGBA1C 5.8 09/20/2012   No results found for: PROLACTIN Lab Results  Component Value Date   CHOL 341* 12/20/2012   TRIG 513* 12/20/2012   HDL 34* 12/20/2012   CHOLHDL 10.0 12/20/2012   VLDL NOT CALC 12/20/2012   [place]  12/20/2012     Comment:       Not calculated due to Triglyceride >400. Suggest ordering Direct LDL (Unit Code: [phone]).   Total Cholesterol/HDL Ratio:CHD Risk                        Coronary Heart Disease Risk Table                                        Men       Women          1/2 Average Risk              3.4        3.3              Average Risk              5.0        4.4           2X Average Risk              9.6        7.1           3X Average Risk             23.4       11.0 Use the calculated Patient Ratio above and the CHD Risk table  to determine the patient's CHD Risk. ATP III Classification (LDL):       < 100        mg/dL         Optimal      100 - 129     mg/dL         Near or Above Optimal      130 - 159     mg/dL         Borderline High      160 - 189     mg/dL         High       > 190        mg/dL         Very High     LDLCALC 183* 04/21/2012    Current Medications: Current Facility-Administered Medications  Medication Dose Route Frequency Provider Last Rate Last Dose  . acetaminophen (TYLENOL) tablet 1,000 mg  1,000 mg Oral Q6H PRN Laverle Hobby, PA-C   1,000 mg at 02/13/15 1257  . alum & mag hydroxide-simeth (MAALOX/MYLANTA) 200-200-20 MG/5ML suspension 30 mL  30 mL Oral Q4H PRN Laverle Hobby, PA-C      . amLODipine (NORVASC) tablet 5 mg  5 mg Oral QHS Jenne Campus, MD      .  clopidogrel (PLAVIX) tablet 75 mg  75 mg Oral Daily Laverle Hobby, PA-C   75 mg at 02/13/15 5643  . cyclobenzaprine (FLEXERIL) tablet 10 mg  10 mg Oral Q8H PRN Laverle Hobby, PA-C   10 mg at 02/13/15 3295  . doxepin (SINEQUAN) capsule 25 mg  25 mg Oral QHS,MR X 1 Spencer E Simon, PA-C   25 mg at 02/13/15 0245  . hydrOXYzine (ATARAX/VISTARIL) tablet 25 mg  25 mg Oral Q6H PRN Laverle Hobby, PA-C   25 mg at 02/13/15 0245  . levothyroxine (SYNTHROID, LEVOTHROID) tablet 112 mcg  112 mcg Oral QAC breakfast Laverle Hobby, PA-C   112 mcg at 02/13/15 1884  . lisinopril (PRINIVIL,ZESTRIL) tablet 40 mg  40 mg Oral Daily Laverle Hobby, PA-C   40 mg at 02/13/15 1660  . magnesium hydroxide (MILK OF MAGNESIA) suspension 30 mL  30 mL Oral Daily PRN Laverle Hobby, PA-C      . pantoprazole (PROTONIX) EC tablet 40 mg  40 mg Oral Daily Laverle Hobby, PA-C   40 mg at 02/13/15 0837  . sertraline (ZOLOFT) tablet 50 mg  50 mg Oral Daily Laverle Hobby, PA-C   50 mg at 02/13/15 6301   PTA Medications: Prescriptions prior to admission  Medication Sig Dispense Refill Last Dose  . acetaminophen (TYLENOL) 500 MG tablet Take 1,000 mg by mouth every 6 (six) hours as needed for headache.   02/11/2015 at Unknown time  . amLODipine (NORVASC) 10 MG tablet Take 1 tablet (10 mg total) by mouth daily. (Patient not taking: Reported on 02/12/2015) 90 tablet 1 Not Taking at Unknown time  . clopidogrel (PLAVIX) 75 MG tablet Take 1 tablet (75 mg total) by mouth daily. (Patient not taking: Reported on 02/12/2015) 90 tablet 1 Not Taking at Unknown time  . cyclobenzaprine (FLEXERIL) 10 MG tablet Take 1 tablet (10 mg total) by mouth every 8 (eight) hours as needed for muscle spasms. (Patient not taking: Reported on 02/12/2015) 60 tablet 6 Not Taking at Unknown time  . doxepin (SINEQUAN) 100 MG capsule TAKE 1 CAPSULE BY MOUTH AT BEDTIME. (Patient not taking: Reported on 02/12/2015) 30 capsule 0 Not Taking at Unknown time  . levothyroxine (SYNTHROID, LEVOTHROID) 112 MCG tablet Take 1 tablet (112 mcg total) by mouth daily before breakfast. 90 tablet 0 Past Week at Unknown time  . lisinopril  (PRINIVIL,ZESTRIL) 40 MG tablet Take 1 tablet (40 mg total) by mouth daily. (Patient not taking: Reported on 02/12/2015) 90 tablet 1 Not Taking at Unknown time  . pantoprazole (PROTONIX) 40 MG tablet TAKE 1 TABLET BY MOUTH 2 TIMES DAILY. (Patient not taking: Reported on 02/12/2015) 60 tablet 3 Not Taking at Unknown time  . sertraline (ZOLOFT) 100 MG tablet Take 1 tablet (100 mg total) by mouth daily. (Patient not taking: Reported on 02/12/2015) 90 tablet 1 Not Taking at Unknown time  . Simethicone (GAS-X PO) Take 1 tablet by mouth every 6 (six) hours as needed (for flatulence).   02/11/2015 at Unknown time  . triamterene-hydrochlorothiazide (MAXZIDE-25) 37.5-25 MG per tablet Take 1 tablet by mouth daily. (Patient not taking: Reported on 02/12/2015) 90 tablet 1 Not Taking at Unknown time    Musculoskeletal: Strength & Muscle Tone: within normal limits Gait & Station: normal Patient leans: N/A  Psychiatric Specialty Exam: Physical Exam  Review of Systems  Constitutional: Negative.   Eyes: Negative.   Respiratory: Negative.   Cardiovascular: Negative.   Gastrointestinal: Positive for nausea. Negative  for blood in stool and melena.  Genitourinary: Negative.   Skin: Negative.   Neurological: Negative.   Endo/Heme/Allergies:       Denies any active bleeding   Psychiatric/Behavioral: Positive for depression and suicidal ideas.  All other systems reviewed and are negative.   Blood pressure 130/81, pulse 86, temperature 98 F (36.7 C), temperature source Oral, resp. rate 16, height _0  (1.549 m), weight 206 lb (93.441 kg).Body mass index is 38.94 kg/(m^2).  General Appearance: Fairly Groomed  Engineer, water::  Good  Speech:  Normal Rate  Volume:  Normal  Mood:  Depressed  Affect:  Constricted and but reactive - vaguely irritable   Thought Process:  Linear  Orientation:  Other:  fully alert and attentive   Thought Content:  denies hallucinations, no delusions, not internally preoccupied    Suicidal Thoughts:  Yes.  without intent/plan- describes passive thoughts of dying, but denies any plan or intention of hurting self or of SI, and contracts for safety on the unit   Homicidal Thoughts:  No  Memory:  recent and remote grossly intact   Judgement:  Fair  Insight:  Fair  Psychomotor Activity:  Normal  Concentration:  Good  Recall:  Good  Fund of Knowledge:Good  Language: Good  Akathisia:  Negative  Handed:  Right  AIMS (if indicated):     Assets:  Desire for Improvement Resilience  ADL's:  Intact  Cognition: WNL  Sleep:  Number of Hours: 3     Treatment Plan Summary: Daily contact with patient to assess and evaluate symptoms and progress in treatment, Medication management, Plan inpatient admission and medications as below   Observation Level/Precautions:  15 minute checks  Laboratory:  as needed   Psychotherapy:  Supportive, groups , milieu   Medications: Agrees to continue Zoloft 50 mgrs QDAY for depression and anxiety, will D/C Sinequan due to possible side effects- complains of dry mouth, blurry vision. Start Trazodone 50 mgrs QHS PRN for insomnia    Consultations:  As needed   Discharge Concerns:  -  Estimated LOS: 5 days   Other:     I certify that inpatient services furnished can reasonably be expected to improve the patient's condition.   Neita Garnet 1/31/20174:08 PM

## 2015-02-13 NOTE — Progress Notes (Addendum)
D: Pt presents flat, depressed, and anxious. Despite encouragement, pt did not attend group. " I have agoraphobia". Pt did sit in the dayroom prior to group, but continued to refused group. Pt denies any current suicidal ideation.  A: Writer administered scheduled and prn medications to pt, per MD orders. Continued support and availability as needed was extended to this pt. Staff continues to monitor pt with q37min checks.  R: No adverse drug reactions noted. Pt receptive to treatment. Pt remains safe at this time.

## 2015-02-13 NOTE — Progress Notes (Signed)
Case discussed with Dr. Redmond Pulling at the time of the visit. We reviewed the resident's history and exam and pertinent patient test results. I agree with the assessment, diagnosis, and plan of care documented in the resident's note.  With active suicide ideation she requires further evaluation by Mental Health and was escorted to the ED to receive such an assessment.

## 2015-02-13 NOTE — B[facility] Group Notes (Signed)
[place] LCSW Group Therapy 02/13/2015  1:15 PM   Type of Therapy: Group Therapy  Participation Level: Did Not Attend. Patient invited to participate but declined.   Tilden Fossa, MSW, [facility] Clinical Social Worker [facility] [address] [phone]

## 2015-02-14 MED ORDER — DOXEPIN HCL 25 MG PO CAPS
25.0000 mg | ORAL_CAPSULE | Freq: Every day | ORAL | Status: DC
  Administered 2015-02-14 – 2015-02-15 (×2): 25 mg via ORAL
  Filled 2015-02-14 (×5): qty 1

## 2015-02-14 NOTE — Progress Notes (Signed)
D: Pt presents with flat affect and depressed mood. Pt irritable during shift assessment. Pt stated that she had little to no sleep last night. Pt denied having nightmares last night but reported ongoing racing thoughts. Pt stated that her sleep med was changed but is not effective. Pt anxious this morning and requested Vistaril for anxiety. Pt rates depression 8/10. Hopeless 10/10. Anxiety 8/10. Pt denies suicidal thoughts this morning. A: Medications administered as ordered per MD. Medications reviewed with pt. Verbal support provided. Pt encouraged to attend groups. 15 minute checks performed for safety.  R: Pt verbalized understanding of med regimen. Pt stated goal "to participate in the group sessions". Pt receptive to tx.
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
## 2015-02-14 NOTE — Progress Notes (Addendum)
[facility] MD Progress Note  02/14/2015 4:28 PM [name]  MRN:  [id number] Subjective:  Reports that her mood is " a little better", feeling less depressed, still  " anxious around people", which has caused discomfort particularly during meal times .  States Remeron did not help, slept poorly last night- wants to restart Doxepin, which she states she has been on for " years ". States " it does give me a dry mouth, but I sleep well, so I like the medication". Thus far tolerating Zoloft trial well . Objective : I have discussed case with treatment team and have met with patient. Tends to isolate in room but has gone to some groups- as noted, reports anxiety during meal times in particular, states she feels " hurried", and uncomfortable because she feels people are watching her eat. Behavior on unit in good control- no agitated or disruptive behaviors . States mood improving , and feels less severely depressed. Denies any suicidal ideations, and denies any psychotic symptoms. As  Noted, focuses on poor sleep, in spite of Remeron trial. Wants to restart Doxepin, which she states is helpful and well tolerated- it had been stopped dur to concerns of side effects and reports of dry mouth, but today states that these side effects were mild . As she improves she is starting to be more future oriented and is hoping to be discharged soon.    Principal Problem:  Major Depression , Recurrent, no Psychotic Features  Diagnosis:   Patient Active Problem List   Diagnosis Date Noted  . MDD (major depressive disorder), recurrent episode, severe ([place]) [F33.2] 02/13/2015  . Dysphagia [R13.10] 10/26/2014  . New onset of headaches after age 74 [R51] 09/19/2014  . Pulsatile tinnitus of left ear [id number]] 09/19/2014  . Blurry vision [H53.8] 09/19/2014  . Excessive daytime sleepiness [G47.19] 09/19/2014  . Morbid obesity ([place]) [E66.01] 09/19/2014  . Nocturnal headaches [R51] 09/19/2014  . Left-sided weakness [M62.89]  04/20/2014  . Tobacco use disorder [F17.200] 04/19/2014  . Blood in stool [K92.1] 01/30/2013  . Hematuria, gross [R31.0] 01/30/2013  . Intermittent claudication ([place]) [I73.9] 01/20/2013  . Eye mass [H57.8] 12/13/2012  . Cataract [H26.9] 12/13/2012  . Recurrent headache [R51] 12/13/2012  . Major depressive disorder, recurrent episode, severe ([place]) [F33.2] 10/29/2012  . Anxiety state, unspecified [F41.1] 09/20/2012  . Abnormality of gait [R26.9] 04/21/2012  . PAD (peripheral artery disease) ([place]) [I73.9] 04/15/2012  . Hyperlipidemia [E78.5] 04/15/2012  . Hypertension [I10] 04/15/2012  . Hypothyroidism [E03.9] 04/15/2012  . GERD (gastroesophageal reflux disease) [K21.9] 04/15/2012  . Routine health maintenance [Z00.00] 04/15/2012   Total Time spent with patient: 20 minutes    Past Medical History:  Past Medical History  Diagnosis Date  . Hypothyroidism   . Hypertension   . TIA (transient ischemic attack)   . PUD (peptic ulcer disease)   . HLD (hyperlipidemia)   . Depression   . GERD (gastroesophageal reflux disease)   . Diverticulosis   . PAD (peripheral artery disease) ([place])   . Anal fissure   . Anxiety   . Chronic headaches   . Cataract   . Glaucoma   . Allergy     Past Surgical History  Procedure Laterality Date  . Total thyroidectomy    . Aortogram    . Iliac artery angioplasty and stenting Left 12/2012  . Sfa stent Left 2012    at [facility]  . Abdominal aortagram N/A 01/20/2013    Procedure: ABDOMINAL AORTAGRAM;  Surgeon: Conrad [place], MD;  Location: [facility] CATH LAB;  Service: Cardiovascular;  Laterality: N/A;  . Anal fissure repair    . Cataract extraction Left   . Breast reduction surgery    . Stomach surgery     Family History:  Family History  Problem Relation Age of Onset  . Peripheral vascular disease Mother   . Heart disease Mother   . Hypertension Mother   . AAA (abdominal aortic aneurysm) Father   . Hypertension Father   .  Uterine cancer Sister     mets to brain  . Liver cancer Brother   . Stomach cancer Maternal Aunt     x2  . Stomach cancer Paternal Aunt   . Heart disease Brother   . Kidney cancer Brother   . Anuerysm Brother     brain  . HIV Brother     x2  . Aneurysm Father     Social History:  History  Alcohol Use No     History  Drug Use  . 1.00 per week  . Special: Cocaine, Marijuana    Comment: marijuana    Social History   Social History  . Marital Status: Widowed    Spouse Name: N/A  . Number of Children: 1  . Years of Education: 12   Occupational History  . Disabled     Social History Main Topics  . Smoking status: Current Every Day Smoker -- 0.50 packs/day for .5 years    Types: Cigarettes  . Smokeless tobacco: Never Used  . Alcohol Use: No  . Drug Use: 1.00 per week    Special: Cocaine, Marijuana     Comment: marijuana  . Sexual Activity: No   Other Topics Concern  . None   Social History Narrative   Live at home alone.   Caffeine use: Drinks coffee once/day   Soda- 2-3 times per day      Additional Social History:   Sleep: Fair  Appetite:  Good  Current Medications: Current Facility-Administered Medications  Medication Dose Route Frequency Provider Last Rate Last Dose  . acetaminophen (TYLENOL) tablet 1,000 mg  1,000 mg Oral Q6H PRN Laverle Hobby, PA-C   1,000 mg at 02/14/15 1442  . alum & mag hydroxide-simeth (MAALOX/MYLANTA) 200-200-20 MG/5ML suspension 30 mL  30 mL Oral Q4H PRN Laverle Hobby, PA-C   30 mL at 02/14/15 1308  . amLODipine (NORVASC) tablet 5 mg  5 mg Oral QHS Jenne Campus, MD   5 mg at 02/13/15 2203  . clopidogrel (PLAVIX) tablet 75 mg  75 mg Oral Daily Laverle Hobby, PA-C   75 mg at 02/14/15 0801  . cyclobenzaprine (FLEXERIL) tablet 10 mg  10 mg Oral Q8H PRN Laverle Hobby, PA-C   10 mg at 02/14/15 0801  . doxepin (SINEQUAN) capsule 25 mg  25 mg Oral QHS Myer Peer Ellina Sivertsen, MD      . hydrOXYzine (ATARAX/VISTARIL) tablet 25 mg   25 mg Oral Q6H PRN Laverle Hobby, PA-C   25 mg at 02/14/15 0801  . levothyroxine (SYNTHROID, LEVOTHROID) tablet 112 mcg  112 mcg Oral QAC breakfast Laverle Hobby, PA-C   112 mcg at 02/14/15 8338  . lisinopril (PRINIVIL,ZESTRIL) tablet 40 mg  40 mg Oral Daily Laverle Hobby, PA-C   40 mg at 02/14/15 0801  . magnesium hydroxide (MILK OF MAGNESIA) suspension 30 mL  30 mL Oral Daily PRN Laverle Hobby, PA-C      .  pantoprazole (PROTONIX) EC tablet 40 mg  40 mg Oral Daily Laverle Hobby, PA-C   40 mg at 02/14/15 0801  . sertraline (ZOLOFT) tablet 50 mg  50 mg Oral Daily Laverle Hobby, PA-C   50 mg at 02/14/15 6720    Lab Results:  Results for orders placed or performed during the hospital encounter of 02/12/15 (from the past 48 hour(s))  Comprehensive metabolic panel     Status: Abnormal   Collection Time: 02/12/15  5:45 PM  Result Value Ref Range   Sodium 142 135 - 145 mmol/L   Potassium 4.7 3.5 - 5.1 mmol/L   Chloride 106 101 - 111 mmol/L   CO2 23 22 - 32 mmol/L   Glucose, Bld 100 (H) 65 - 99 mg/dL   BUN 12 6 - 20 mg/dL   Creatinine, Ser 1.27 (H) 0.44 - 1.00 mg/dL   Calcium 9.7 8.9 - 10.3 mg/dL   Total Protein 7.1 6.5 - 8.1 g/dL   Albumin 4.0 3.5 - 5.0 g/dL   AST 29 15 - 41 U/L   ALT 16 14 - 54 U/L   Alkaline Phosphatase 95 38 - 126 U/L   Total Bilirubin 0.8 0.3 - 1.2 mg/dL   GFR calc non Af Amer 43 (L) >60 mL/min   GFR calc Af Amer 50 (L) >60 mL/min    Comment: (NOTE) The eGFR has been calculated using the CKD EPI equation. This calculation has not been validated in all clinical situations. eGFR's persistently <60 mL/min signify possible Chronic Kidney Disease.    Anion gap 13 5 - 15  Ethanol (ETOH)     Status: None   Collection Time: 02/12/15  5:45 PM  Result Value Ref Range   Alcohol, Ethyl (B) <5 <5 mg/dL    Comment:        LOWEST DETECTABLE LIMIT FOR SERUM ALCOHOL IS 5 mg/dL FOR MEDICAL PURPOSES ONLY   Salicylate level     Status: None   Collection Time:  02/12/15  5:45 PM  Result Value Ref Range   Salicylate Lvl <9.4 2.8 - 30.0 mg/dL  Acetaminophen level     Status: Abnormal   Collection Time: 02/12/15  5:45 PM  Result Value Ref Range   Acetaminophen (Tylenol), Serum <10 (L) 10 - 30 ug/mL    Comment:        THERAPEUTIC CONCENTRATIONS VARY SIGNIFICANTLY. A RANGE OF 10-30 ug/mL MAY BE AN EFFECTIVE CONCENTRATION FOR MANY PATIENTS. HOWEVER, SOME ARE BEST TREATED AT CONCENTRATIONS OUTSIDE THIS RANGE. ACETAMINOPHEN CONCENTRATIONS >150 ug/mL AT 4 HOURS AFTER INGESTION AND >50 ug/mL AT 12 HOURS AFTER INGESTION ARE OFTEN ASSOCIATED WITH TOXIC REACTIONS.   CBC     Status: None   Collection Time: 02/12/15  5:45 PM  Result Value Ref Range   WBC 6.2 4.0 - 10.5 K/uL   RBC 4.72 3.87 - 5.11 MIL/uL   Hemoglobin 14.8 12.0 - 15.0 g/dL   HCT 44.7 36.0 - 46.0 %   MCV 94.7 78.0 - 100.0 fL   MCH 31.4 26.0 - 34.0 pg   MCHC 33.1 30.0 - 36.0 g/dL   RDW 13.7 11.5 - 15.5 %   Platelets 171 150 - 400 K/uL    Physical Findings: AIMS: Facial and Oral Movements Muscles of Facial Expression: None, normal Lips and Perioral Area: None, normal Jaw: None, normal Tongue: None, normal,Extremity Movements Upper (arms, wrists, hands, fingers): None, normal Lower (legs, knees, ankles, toes): None, normal, Trunk Movements Neck, shoulders, hips: None, normal, Overall  Severity Severity of abnormal movements (highest score from questions above): None, normal Incapacitation due to abnormal movements: None, normal Patient's awareness of abnormal movements (rate only patient's report): No Awareness, Dental Status Current problems with teeth and/or dentures?: No Does patient usually wear dentures?: No  CIWA:    COWS:     Musculoskeletal: Strength & Muscle Tone: within normal limits Gait & Station: normal Patient leans: N/A  Psychiatric Specialty Exam: ROS describes mild headache,  Denies blurry vision at this time, denies chest pain, no SOB. Denies any  active bleeding at this time ( is on Plavix)   Blood pressure 101/71, pulse 77, temperature 97.7 F (36.5 C), temperature source Oral, resp. rate 16, height 5' 1"  (1.549 m), weight 206 lb (93.441 kg).Body mass index is 38.94 kg/(m^2).  General Appearance: Fairly Groomed  Engineer, water::  Good  Speech:  Normal Rate  Volume:  Normal  Mood:  Depressed, but states today feeling " a little bit better "  Affect:  less constricted   Thought Process:  Linear  Orientation:  Other:  fully alert and attentive   Thought Content:  denies hallicinations, no delusions   Suicidal Thoughts:  No- denies any suicidal or self injurious ideations  Homicidal Thoughts:  No  Memory:  recent and remote grossly intact   Judgement:  Other:  improved   Insight:  improving   Psychomotor Activity:  Normal  Concentration:  Good  Recall:  Good  Fund of Knowledge:Good  Language: Good  Akathisia:  Negative  Handed:  Right  AIMS (if indicated):     Assets:  Desire for Improvement Resilience  ADL's:   Improving   Cognition: WNL  Sleep:  Number of Hours: 3  Assessment-  Patient's mood and affect present partially improved- less depressed today. Denies SI. She is becoming more future oriented. Insomnia is an ongoing issue, states she slept poorly in spite of Remeron trial, and wants to resume Doxepkin management , which she states helped her sleep well prior to admission- we reviewed side effect profile. She does feel Zoloft is well tolerated . She reports history of some agoraphobia, and feels uncomfortable and self conscious in cafeteria- no psychotic symptoms.  Treatment Plan Summary: Daily contact with patient to assess and evaluate symptoms and progress in treatment, Medication management, Plan inpatient treatment  and medications as below  Continue to encourage group, milieu participation to work on coping skills and symptom reduction Continue to encourage and support smoking cessation efforts - patient states she  currently feels unready to stop smoking- has history of PAD. Continue Zoloft 50 mgrs QDAY for depression D/C Remeron- see above  Restart Doxepin at 25 mgrs QHS for insomnia Continue Plavix for history of vascular disease  Continue Synthroid for history of Hypothyroidism  TSH ordered  Neita Garnet, MD 02/14/2015, 4:28 PM

## 2015-02-14 NOTE — Tx Team (Addendum)
Interdisciplinary Treatment Plan Update (Adult) Date: 02/13/15    Time Reviewed: 9:30 AM  Progress in Treatment: Attending groups: Continuing to assess, patient new to milieu Participating in groups: Continuing to assess, patient new to milieu Taking medication as prescribed: Yes Tolerating medication: Yes Family/Significant other contact made: No, CSW assessing for appropriate contacts Patient understands diagnosis: Yes Discussing patient identified problems/goals with staff: Yes Medical problems stabilized or resolved: Yes Denies suicidal/homicidal ideation: Yes Issues/concerns per patient self-inventory: Yes Other:  New problem(s) identified: N/A  Discharge Plan or Barriers: CSW continuing to assess, patient new to milieu.  Reason for Continuation of Hospitalization:  Depression Anxiety Medication Stabilization   Comments: N/A  Estimated length of stay: 3-5 days    Patient is a 64 year old female admitted for depression and anxiety. Pt reports primary trigger(s) for admission are housing situation and grief. Patient will benefit from crisis stabilization, medication evaluation, group therapy and psycho education in addition to case management for discharge planning. At discharge, it is recommended that Pt remain compliant with established discharge plan and continued treatment.   Review of initial/current patient goals per problem list:  1. Goal(s): Patient will participate in aftercare plan   Met: Yes   Target date: 3-5 days post admission date   As evidenced by: Patient will participate within aftercare plan AEB aftercare provider and housing plan at discharge being identified.  1/31: Goal met. Patient plans to return home to follow up with outpatient services.    2. Goal (s): Patient will exhibit decreased depressive symptoms and suicidal ideations.   Met: No   Target date: 3-5 days post admission date   As evidenced by: Patient will utilize self  rating of depression at 3 or below and demonstrate decreased signs of depression or be deemed stable for discharge by MD.  1/31: Goal not met: Pt presents with flat affect and depressed mood.  Pt admitted with depression rating of 10.  Pt to show decreased sign of depression and a rating of 3 or less before d/c.       3. Goal(s): Patient will demonstrate decreased signs and symptoms of anxiety.   Met: No   Target date: 3-5 days post admission date   As evidenced by: Patient will utilize self rating of anxiety at 3 or below and demonstrated decreased signs of anxiety, or be deemed stable for discharge by MD  1/31: Goal not met: Pt presents with anxious mood and affect.  Pt admitted with anxiety rating of 10.  Pt to show decreased sign of anxiety and a rating of 3 or less before d/c.   Attendees: Patient:    Family:    Physician: Dr. Cobos; Dr. Eappen 02/13/2015 9:30 AM  Nursing: Christa Dobson, Sara Twyman, Roni Waller, Ashley Strader, RN 02/13/2015 9:30 AM  Clinical Social Worker:  , LCSW 02/13/2015 9:30 AM  Other: Lauren Carter, LCSWA; Heather Smart, LCSW  02/13/2015 9:30 AM   02/13/2015 9:30 AM  Other: Jennifer Clark, Case Manager 02/13/2015 9:30 AM  Other: Aggie Nwoko, Conrad Withrow , NP 02/13/2015 9:30 AM    Scribe for Treatment Team:   , LCSW [phone]     

## 2015-02-14 NOTE — BHH Group Notes (Signed)
[facility] LCSW Aftercare Discharge Planning Group Note  02/14/2015  8:45 AM  Participation Quality: Did Not Attend. Patient invited to participate but declined.  Tilden Fossa, MSW, [place] Clinical Social Worker [facility] [phone]

## 2015-02-14 NOTE — BHH Group Notes (Signed)
[place] LCSW Group Therapy 02/14/2015  1:15 PM Type of Therapy: Group Therapy Participation Level: Active  Participation Quality: Attentive, Sharing and Supportive  Affect: Depressed and Flat  Cognitive: Alert and Oriented  Insight: Developing/Improving and Engaged  Engagement in Therapy: Developing/Improving and Engaged  Modes of Intervention: Clarification, Confrontation, Discussion, Education, Exploration, Limit-setting, Orientation, Problem-solving, Rapport Building, Art therapist, Socialization and Support  Summary of Progress/Problems: The topic for group today was emotional regulation. This group focused on both positive and negative emotion identification and allowed group members to process ways to identify feelings, regulate negative emotions, and find healthy ways to manage internal/external emotions. Group members were asked to reflect on a time when their reaction to an emotion led to a negative outcome and explored how alternative responses using emotion regulation would have benefited them. Group members were also asked to discuss a time when emotion regulation was utilized when a negative emotion was experienced.  Tilden Fossa, MSW, [place] Clinical Social Worker [facility] [phone]

## 2015-02-14 NOTE — Progress Notes (Signed)
Adult Psychoeducational Group Note  Date:  02/14/2015 Time:  10:09 PM  Group Topic/Focus:  Wrap-Up Group:   The focus of this group is to help patients review their daily goal of treatment and discuss progress on daily workbooks.  Participation Level:  Minimal  Participation Quality:  Appropriate  Affect:  Appropriate  Cognitive:  Alert  Insight: Appropriate  Engagement in Group:  Engaged  Modes of Intervention:  Discussion  Additional Comments:  Pt stated that her day has been up and down. She continues to have a hard time sleeping. Her goal for tomorrow is to get up and get dressed.   Wynelle Fanny R 02/14/2015, 10:09 PM

## 2015-02-15 LAB — TSH: TSH: 5.369 u[IU]/mL — AB (ref 0.350–4.500)

## 2015-02-15 MED ORDER — ACETAMINOPHEN 500 MG PO TABS
500.0000 mg | ORAL_TABLET | Freq: Four times a day (QID) | ORAL | Status: DC | PRN
  Administered 2015-02-15 – 2015-02-19 (×7): 500 mg via ORAL
  Filled 2015-02-15 (×7): qty 1

## 2015-02-15 MED ORDER — DOXEPIN HCL 25 MG PO CAPS
25.0000 mg | ORAL_CAPSULE | Freq: Once | ORAL | Status: AC
  Administered 2015-02-15: 25 mg via ORAL
  Filled 2015-02-15 (×2): qty 1

## 2015-02-15 MED ORDER — SERTRALINE HCL 100 MG PO TABS
100.0000 mg | ORAL_TABLET | Freq: Every day | ORAL | Status: DC
Start: 2015-02-16 — End: 2015-02-19
  Administered 2015-02-16 – 2015-02-19 (×4): 100 mg via ORAL
  Filled 2015-02-15 (×6): qty 1

## 2015-02-15 MED ORDER — ARIPIPRAZOLE 2 MG PO TABS
2.0000 mg | ORAL_TABLET | Freq: Every day | ORAL | Status: DC
  Administered 2015-02-15 – 2015-02-21 (×7): 2 mg via ORAL
  Filled 2015-02-15 (×11): qty 1

## 2015-02-15 NOTE — ED Provider Notes (Signed)
CSN: [id number]     Arrival date & time 02/12/15  1722 History   First MD Initiated Contact with Patient 02/12/15 1947     Chief Complaint  Patient presents with  . Medical Clearance  . Suicidal      HPI  Patient presents for evaluation after referral from an outpatient clinic for possibility of depression or being suicidal. Patient states that she's had a lot of loss with her family in the last several years. Lost her husband was. She states that financially she had a cell her home that she lived in for greater than 20 years. Had limited with a relative that she does not get along well with. She states that there are roaches and dialysis filthy. States she cannot eat the food there because of her discussed over the fill. She states that in general she does not have any active plans of killing herself now, but states "I just wish I would died the Lord would take me". Apparently attempted or considered taking "a bunch of pills" last week but did not.  Past Medical History  Diagnosis Date  . Hypothyroidism   . Hypertension   . TIA (transient ischemic attack)   . PUD (peptic ulcer disease)   . HLD (hyperlipidemia)   . Depression   . GERD (gastroesophageal reflux disease)   . Diverticulosis   . PAD (peripheral artery disease) ([place])   . Anal fissure   . Anxiety   . Chronic headaches   . Cataract   . Glaucoma   . Allergy    Past Surgical History  Procedure Laterality Date  . Total thyroidectomy    . Aortogram    . Iliac artery angioplasty and stenting Left 12/2012  . Sfa stent Left 2012    at [facility]  . Abdominal aortagram N/A 01/20/2013    Procedure: ABDOMINAL Maxcine Ham;  Surgeon: Conrad Gwinn, MD;  Location: [facility] CATH LAB;  Service: Cardiovascular;  Laterality: N/A;  . Anal fissure repair    . Cataract extraction Left   . Breast reduction surgery    . Stomach surgery     Family History  Problem Relation Age of Onset  . Peripheral vascular disease Mother    . Heart disease Mother   . Hypertension Mother   . AAA (abdominal aortic aneurysm) Father   . Hypertension Father   . Uterine cancer Sister     mets to brain  . Liver cancer Brother   . Stomach cancer Maternal Aunt     x2  . Stomach cancer Paternal Aunt   . Heart disease Brother   . Kidney cancer Brother   . Anuerysm Brother     brain  . HIV Brother     x2  . Aneurysm Father    Social History  Substance Use Topics  . Smoking status: Current Every Day Smoker -- 0.50 packs/day for .5 years    Types: Cigarettes  . Smokeless tobacco: Never Used  . Alcohol Use: No   OB History    No data available     Review of Systems  Constitutional: Positive for appetite change. Negative for fever, chills, diaphoresis and fatigue.  HENT: Negative for mouth sores, sore throat and trouble swallowing.   Eyes: Negative for visual disturbance.  Respiratory: Negative for cough, chest tightness, shortness of breath and wheezing.   Cardiovascular: Negative for chest pain.  Gastrointestinal: Negative for nausea, vomiting, abdominal pain, diarrhea and abdominal distention.  Endocrine: Negative for  polydipsia, polyphagia and polyuria.  Genitourinary: Negative for dysuria, frequency and hematuria.  Musculoskeletal: Negative for gait problem.  Skin: Negative for color change, pallor and rash.  Neurological: Negative for dizziness, syncope, light-headedness and headaches.  Hematological: Does not bruise/bleed easily.  Psychiatric/Behavioral: Positive for suicidal ideas, sleep disturbance and dysphoric mood. Negative for behavioral problems and confusion.      Allergies  Asa; Crestor; Penicillins; and Gadolinium derivatives  Home Medications   Prior to Admission medications   Medication Sig Start Date End Date Taking? Authorizing Provider  acetaminophen (TYLENOL) 500 MG tablet Take 1,000 mg by mouth every 6 (six) hours as needed for headache.   Yes Historical Provider, MD  levothyroxine  (SYNTHROID, LEVOTHROID) 112 MCG tablet Take 1 tablet (112 mcg total) by mouth daily before breakfast. 02/12/15  Yes Francesca Oman, DO  Simethicone (GAS-X PO) Take 1 tablet by mouth every 6 (six) hours as needed (for flatulence).   Yes Historical Provider, MD  amLODipine (NORVASC) 10 MG tablet Take 1 tablet (10 mg total) by mouth daily. Patient not taking: Reported on 02/12/2015 02/12/15 02/12/16  Francesca Oman, DO  clopidogrel (PLAVIX) 75 MG tablet Take 1 tablet (75 mg total) by mouth daily. Patient not taking: Reported on 02/12/2015 02/12/15 02/12/16  Francesca Oman, DO  cyclobenzaprine (FLEXERIL) 10 MG tablet Take 1 tablet (10 mg total) by mouth every 8 (eight) hours as needed for muscle spasms. Patient not taking: Reported on 02/12/2015 09/19/14 09/19/15  Melvenia Beam, MD  doxepin (SINEQUAN) 100 MG capsule TAKE 1 CAPSULE BY MOUTH AT BEDTIME. Patient not taking: Reported on 02/12/2015 12/13/14   Francesca Oman, DO  lisinopril (PRINIVIL,ZESTRIL) 40 MG tablet Take 1 tablet (40 mg total) by mouth daily. Patient not taking: Reported on 02/12/2015 08/07/14 08/07/15  Francesca Oman, DO  pantoprazole (PROTONIX) 40 MG tablet TAKE 1 TABLET BY MOUTH 2 TIMES DAILY. Patient not taking: Reported on 02/12/2015 10/16/14   Irene Shipper, MD  sertraline (ZOLOFT) 100 MG tablet Take 1 tablet (100 mg total) by mouth daily. Patient not taking: Reported on 02/12/2015 08/07/14   Francesca Oman, DO  triamterene-hydrochlorothiazide (MAXZIDE-25) 37.5-25 MG per tablet Take 1 tablet by mouth daily. Patient not taking: Reported on 02/12/2015 08/07/14 08/07/15  Francesca Oman, DO   BP 131/71 mmHg  Pulse 66  Temp(Src) 97.9 F (36.6 C) (Oral)  Resp 16  SpO2 98% Physical Exam  Constitutional: She is oriented to person, place, and time. She appears well-developed and well-nourished. No distress.  HENT:  Head: Normocephalic.  Eyes: Conjunctivae are normal. Pupils are equal, round, and reactive to light. No scleral icterus.  Neck: Normal range  of motion. Neck supple. No thyromegaly present.  Cardiovascular: Normal rate and regular rhythm.  Exam reveals no gallop and no friction rub.   No murmur heard. Pulmonary/Chest: Effort normal and breath sounds normal. No respiratory distress. She has no wheezes. She has no rales.  Abdominal: Soft. Bowel sounds are normal. She exhibits no distension. There is no tenderness. There is no rebound.  Musculoskeletal: Normal range of motion.  Neurological: She is alert and oriented to person, place, and time.  Skin: Skin is warm and dry. No rash noted.  Psychiatric: She is slowed and withdrawn. She exhibits a depressed mood.    ED Course  Procedures (including critical care time) Labs Review Labs Reviewed  COMPREHENSIVE METABOLIC PANEL - Abnormal; Notable for the following:    Glucose, Bld 100 (*)    Creatinine,  Ser 1.27 (*)    GFR calc non Af Amer 43 (*)    GFR calc Af Amer 50 (*)    All other components within normal limits  ACETAMINOPHEN LEVEL - Abnormal; Notable for the following:    Acetaminophen (Tylenol), Serum <10 (*)    All other components within normal limits  ETHANOL  SALICYLATE LEVEL  CBC    Imaging Review No results found. I have personally reviewed and evaluated these images and lab results as part of my medical decision-making.   EKG Interpretation None      MDM   Final diagnoses:  Depression    Patient will undergo TTS evaluation.    Tanna Furry, MD 02/15/15 0030

## 2015-02-15 NOTE — Progress Notes (Signed)
Pt attended evening karaoke wrap-up group.

## 2015-02-15 NOTE — Progress Notes (Signed)
D: Pt presents with flat affect and depressed mood. Pt rates depression 5/10. Hopeless 6/10. Pt anxious this morning and requested vistaril for anxiety and racing thoughts. Pt reported having weird dreams last night that woke her up but overall, she feels well rested. Pt denies having suicidal thoughts but verbalized she's tired of living. Pt verbally contracts for safety. Pt stated that she has loss her siblings and friends and now have no one for support. Pt stated that she have a daughter but doesn't want to become a burden to her. Pt compliant with taking meds. Pt attended morning group.  A: Medications reviewed with pt. Medications administered as ordered per MD. Verbal support provided. Pt encouraged to attend groups.  R: Pt verbalized understanding of med regimen. Pt receptive to tx. Pt stated goal "shower, get dressed and participate in groups".

## 2015-02-15 NOTE — Progress Notes (Signed)
Patient ID: [name], female   DOB: Jan 31, 1950, 65 y.o.   MRN: [id number] [facility] MD Progress Note  02/15/2015 11:55 AM [name]  MRN:  [id number] Subjective:   Patient  Reports ongoing depression, and continues to report thoughts of " preferring to die", but denies any active suicidal ideations, and contracts for safety on unit. States she struggles with feeling like a burden to her adult daughter, so reluctant to go live with her , and not wanting to return to cousin's home either ( where she had been living prior to admission). Thus far tolerating medications well- today denies side effects. Did sleep better now that she is back on Doxepin, and is less focused on insomnia issues today. Denies any hallucinations - no psychotic symptoms. Objective : I have discussed case with treatment team and have met with patient. She has been less isolative, going to groups , although participation has been limited . She presents sad, depressed, but affect today is somewhat improved, less irritable, and does smile briefly at times . We discussed medication issues- of note, currently denies side effects. Patient states she had been on Zoloft for a long time, apparently had responded well to it, but had stopped it several  Months ago when her husband passed away. She restarted it just a few days prior to her admission. Responsive to support, encouragement - affect did improve partially during session. TSH elevated at 5.3- patient has history of hypothyroidism and is on Synthroid.     Principal Problem:  Major Depression , Recurrent, no Psychotic Features  Diagnosis:   Patient Active Problem List   Diagnosis Date Noted  . MDD (major depressive disorder), recurrent episode, severe ([place]) [F33.2] 02/13/2015  . Dysphagia [R13.10] 10/26/2014  . New onset of headaches after age 89 [R51] 09/19/2014  . Pulsatile tinnitus of left ear [id number]] 09/19/2014  . Blurry vision [H53.8] 09/19/2014  . Excessive  daytime sleepiness [G47.19] 09/19/2014  . Morbid obesity ([place]) [E66.01] 09/19/2014  . Nocturnal headaches [R51] 09/19/2014  . Left-sided weakness [M62.89] 04/20/2014  . Tobacco use disorder [F17.200] 04/19/2014  . Blood in stool [K92.1] 01/30/2013  . Hematuria, gross [R31.0] 01/30/2013  . Intermittent claudication ([place]) [I73.9] 01/20/2013  . Eye mass [H57.8] 12/13/2012  . Cataract [H26.9] 12/13/2012  . Recurrent headache [R51] 12/13/2012  . Major depressive disorder, recurrent episode, severe ([place]) [F33.2] 10/29/2012  . Anxiety state, unspecified [F41.1] 09/20/2012  . Abnormality of gait [R26.9] 04/21/2012  . PAD (peripheral artery disease) ([place]) [I73.9] 04/15/2012  . Hyperlipidemia [E78.5] 04/15/2012  . Hypertension [I10] 04/15/2012  . Hypothyroidism [E03.9] 04/15/2012  . GERD (gastroesophageal reflux disease) [K21.9] 04/15/2012  . Routine health maintenance [Z00.00] 04/15/2012   Total Time spent with patient:  25 minutes     Past Medical History:  Past Medical History  Diagnosis Date  . Hypothyroidism   . Hypertension   . TIA (transient ischemic attack)   . PUD (peptic ulcer disease)   . HLD (hyperlipidemia)   . Depression   . GERD (gastroesophageal reflux disease)   . Diverticulosis   . PAD (peripheral artery disease) ([place])   . Anal fissure   . Anxiety   . Chronic headaches   . Cataract   . Glaucoma   . Allergy     Past Surgical History  Procedure Laterality Date  . Total thyroidectomy    . Aortogram    . Iliac artery angioplasty and stenting Left 12/2012  . Sfa stent Left 2012  at [facility]  . Abdominal aortagram N/A 01/20/2013    Procedure: ABDOMINAL Maxcine Ham;  Surgeon: Conrad , MD;  Location: [facility] CATH LAB;  Service: Cardiovascular;  Laterality: N/A;  . Anal fissure repair    . Cataract extraction Left   . Breast reduction surgery    . Stomach surgery     Family History:  Family History  Problem Relation Age of Onset  .  Peripheral vascular disease Mother   . Heart disease Mother   . Hypertension Mother   . AAA (abdominal aortic aneurysm) Father   . Hypertension Father   . Uterine cancer Sister     mets to brain  . Liver cancer Brother   . Stomach cancer Maternal Aunt     x2  . Stomach cancer Paternal Aunt   . Heart disease Brother   . Kidney cancer Brother   . Anuerysm Brother     brain  . HIV Brother     x2  . Aneurysm Father     Social History:  History  Alcohol Use No     History  Drug Use  . 1.00 per week  . Special: Cocaine, Marijuana    Comment: marijuana    Social History   Social History  . Marital Status: Widowed    Spouse Name: N/A  . Number of Children: 1  . Years of Education: 12   Occupational History  . Disabled     Social History Main Topics  . Smoking status: Current Every Day Smoker -- 0.50 packs/day for .5 years    Types: Cigarettes  . Smokeless tobacco: Never Used  . Alcohol Use: No  . Drug Use: 1.00 per week    Special: Cocaine, Marijuana     Comment: marijuana  . Sexual Activity: No   Other Topics Concern  . None   Social History Narrative   Live at home alone.   Caffeine use: Drinks coffee once/day   Soda- 2-3 times per day      Additional Social History:   Sleep: improved   Appetite:  Good  Current Medications: Current Facility-Administered Medications  Medication Dose Route Frequency Provider Last Rate Last Dose  . acetaminophen (TYLENOL) tablet 1,000 mg  1,000 mg Oral Q6H PRN Laverle Hobby, PA-C   1,000 mg at 02/15/15 7341  . alum & mag hydroxide-simeth (MAALOX/MYLANTA) 200-200-20 MG/5ML suspension 30 mL  30 mL Oral Q4H PRN Laverle Hobby, PA-C   30 mL at 02/14/15 1308  . amLODipine (NORVASC) tablet 5 mg  5 mg Oral QHS Jenne Campus, MD   5 mg at 02/14/15 2122  . clopidogrel (PLAVIX) tablet 75 mg  75 mg Oral Daily Laverle Hobby, PA-C   75 mg at 02/15/15 9379  . cyclobenzaprine (FLEXERIL) tablet 10 mg  10 mg Oral Q8H PRN Laverle Hobby, PA-C   10 mg at 02/15/15 0240  . doxepin (SINEQUAN) capsule 25 mg  25 mg Oral QHS Jenne Campus, MD   25 mg at 02/14/15 2122  . hydrOXYzine (ATARAX/VISTARIL) tablet 25 mg  25 mg Oral Q6H PRN Laverle Hobby, PA-C   25 mg at 02/15/15 1049  . levothyroxine (SYNTHROID, LEVOTHROID) tablet 112 mcg  112 mcg Oral QAC breakfast Laverle Hobby, PA-C   112 mcg at 02/15/15 9735  . lisinopril (PRINIVIL,ZESTRIL) tablet 40 mg  40 mg Oral Daily Laverle Hobby, PA-C   40 mg at 02/15/15 3299  . magnesium  hydroxide (MILK OF MAGNESIA) suspension 30 mL  30 mL Oral Daily PRN Laverle Hobby, PA-C      . pantoprazole (PROTONIX) EC tablet 40 mg  40 mg Oral Daily Laverle Hobby, PA-C   40 mg at 02/15/15 8118  . sertraline (ZOLOFT) tablet 50 mg  50 mg Oral Daily Laverle Hobby, PA-C   50 mg at 02/15/15 8677    Lab Results:  Results for orders placed or performed during the hospital encounter of 02/13/15 (from the past 48 hour(s))  TSH     Status: Abnormal   Collection Time: 02/15/15  6:45 AM  Result Value Ref Range   TSH 5.369 (H) 0.350 - 4.500 uIU/mL    Comment: Performed at [facility]    Physical Findings: AIMS: Facial and Oral Movements Muscles of Facial Expression: None, normal Lips and Perioral Area: None, normal Jaw: None, normal Tongue: None, normal,Extremity Movements Upper (arms, wrists, hands, fingers): None, normal Lower (legs, knees, ankles, toes): None, normal, Trunk Movements Neck, shoulders, hips: None, normal, Overall Severity Severity of abnormal movements (highest score from questions above): None, normal Incapacitation due to abnormal movements: None, normal Patient's awareness of abnormal movements (rate only patient's report): No Awareness, Dental Status Current problems with teeth and/or dentures?: No Does patient usually wear dentures?: No  CIWA:    COWS:     Musculoskeletal: Strength & Muscle Tone: within normal limits Gait & Station:  normal Patient leans: N/A  Psychiatric Specialty Exam: ROS  Denies blurry vision at this time, denies chest pain, no SOB. Denies any active bleeding at this time ( is on Plavix)   Blood pressure 125/76, pulse 76, temperature 98.1 F (36.7 C), temperature source Oral, resp. rate 18, height 5' 1"  (1.549 m), weight 206 lb (93.441 kg).Body mass index is 38.94 kg/(m^2).  General Appearance: improved grooming   Eye Contact::  Good  Speech:  Normal Rate  Volume:  Normal  Mood:  Remains depressed   Affect:  Constricted, but does smile at times appropriately   Thought Process:  Linear  Orientation:  Other:  fully alert and attentive   Thought Content:  denies hallicinations, no delusions   Suicidal Thoughts:   Reports passive thoughts of dying , but denies any plan or intention of hurting self or of SI, contracts for safety on the unit   Homicidal Thoughts:  No  Memory:  recent and remote grossly intact   Judgement:  Other:  improved   Insight:  improving   Psychomotor Activity:  Normal  Concentration:  Good  Recall:  Good  Fund of Knowledge:Good  Language: Good  Akathisia:  Negative  Handed:  Right  AIMS (if indicated):     Assets:  Desire for Improvement Resilience  ADL's:   Improving   Cognition: WNL  Sleep:  Number of Hours: 5.75  Assessment-   Patient has improved partially compared to admission, but does continue to present sad, ruminative about being a " burden" to her daughter , and to endorse some passive SI.  No psychotic symptoms noted or reported . Denies any active SI, and contracts for safety on the unit . She has history of good response to Zoloft/doxepin- had stopped these medications for several months, and restarted Zoloft just about a week ago. Denies side effects thus far. Patient agrees to Abilify trial as augmentation strategy.     Treatment Plan Summary: Daily contact with patient to assess and evaluate symptoms and progress in treatment, Medication management,  Plan inpatient treatment  and medications as below  Continue to encourage group, milieu participation to work on coping skills and symptom reduction Continue to encourage and support smoking cessation efforts - patient states she currently feels unready to stop smoking- has history of PAD. Increase Zoloft to 100 mgrs QDAY for depression Start Abilify 2 mgrs  QDAY as an adjunct  for management of depression- side effects and rationale reviewed Continue  Doxepin at 25 mgrs QHS for insomnia Continue Plavix for history of vascular disease   COBOS, FERNANDO, MD 02/15/2015, 11:55 AM

## 2015-02-15 NOTE — BHH Group Notes (Signed)
[place] LCSW Group Therapy 02/15/2015 1:15 PM Type of Therapy: Group Therapy Participation Level: Active  Participation Quality: Attentive, Sharing and Supportive  Affect: Depressed and Flat  Cognitive: Alert and Oriented  Insight: Developing/Improving and Engaged  Engagement in Therapy: Developing/Improving and Engaged  Modes of Intervention: Activity, Clarification, Confrontation, Discussion, Education, Exploration, Limit-setting, Orientation, Problem-solving, Rapport Building, Art therapist, Socialization and Support  Summary of Progress/Problems: Patient was attentive and engaged with speaker from [place]. Patient was attentive to speaker while they shared their story of dealing with mental health and overcoming it. Patient expressed interest in their programs and services and received information on their agency. Patient processed ways they can relate to the speaker.   Tilden Fossa, LCSW Clinical Social Worker [facility] [phone]

## 2015-02-15 NOTE — Progress Notes (Signed)
Adult Psychoeducational Group Note  Date:  02/15/2015 Time:  0830  Group Topic/Focus:  Orientation:   The focus of this group is to educate the patient on the purpose and policies of crisis stabilization and provide a format to answer questions about their admission.  The group details unit policies and expectations of patients while admitted.  Participation Level:  Active  Participation Quality:  Appropriate  Affect:  Appropriate  Cognitive:  Appropriate  Insight: Appropriate  Engagement in Group:  Engaged  Modes of Intervention:  Discussion and Orientation  Additional Comments:    Djuan Talton L 02/15/2015, 11:53 AM

## 2015-02-15 NOTE — Progress Notes (Signed)
D:Patient in the dayroom on approach.  Patient states she had a good day today.  Patient states her goal was to get up and get dressed.  Patient states she met her goal for the day.  Patient states she is worred that she is not sleeping and she has to sleep in order to function the next day.  Patient states he bedtime medications still need adjusting because she has been unable to sleep. Patient denies SI/HI and denies AVH.   A: Staff to monitor Q 15 mins for safety.  Encouragement and support offered.  Scheduled medications administered per orders.  Tylenol administered prn for pain. Vistaril administered prn for anxiety. R: Patient remains safe on the unit.  Patient attended group tonight.  Patient visible on the unit and interacting with peers.  Patient taking administered  medications.

## 2015-02-15 NOTE — Progress Notes (Signed)
D: Pt presents flat in affect and depressed in mood. Pt reports having racing thoughts. Per pt, she was in the dayroom to distract her from her racing thoughts. Pt did attend group. Pt missed the previous evening group. No reports of SI/HI/AVH. A: Writer administered scheduled medications to pt, per MD orders. Continued support and availability as needed was extended to this pt. Staff continues to monitor pt with q75min checks.  R: No adverse drug reactions noted. Pt receptive to treatment. Pt remains safe at this time.

## 2015-02-15 NOTE — B[facility] Suicide Risk Assessment (Signed)
[place] INPATIENT:  Family/Significant Other Suicide Prevention Education  Suicide Prevention Education:  Education Completed; daughter Michelle Piper [phone],  (name of family member/significant other) has been identified by the patient as the family member/significant other with whom the patient will be residing, and identified as the person(s) who will aid the patient in the event of a mental health crisis (suicidal ideations/suicide attempt).  With written consent from the patient, the family member/significant other has been provided the following suicide prevention education, prior to the and/or following the discharge of the patient.  The suicide prevention education provided includes the following:  Suicide risk factors  Suicide prevention and interventions  National Suicide Hotline telephone number  [facility] assessment telephone number  [facility] [place] and/or Residential Mobile Crisis Unit telephone number  Request made of family/significant other to:  Remove weapons (e.g., guns, rifles, knives), all items previously/currently identified as safety concern.    Remove drugs/medications (over-the-counter, prescriptions, illicit drugs), all items previously/currently identified as a safety concern.  The family member/significant other verbalizes understanding of the suicide prevention education information provided.  The family member/significant other agrees to remove the items of safety concern listed above.  Cletis Clack, Casimiro Needle 02/15/2015, 12:58 PM

## 2015-02-15 NOTE — Progress Notes (Signed)
Patient came to staff and stated she had talked to her father and "he is not doing well.  I need to leave."  Patient states her father is in ICU and she is worried over his welfare.  Explained to patient that only the MD can discharge her.  She requested 72-hour discharge form and signed.  Patient also came to nurse's station and states that she is dizzy.  Took her blood pressure and it is on  The low side 106/70.  Informed patient to stand slowly when rising and drink gaterade.

## 2015-02-16 MED ORDER — ZOLPIDEM TARTRATE 5 MG PO TABS
5.0000 mg | ORAL_TABLET | Freq: Every day | ORAL | Status: DC
  Administered 2015-02-16 – 2015-02-20 (×5): 5 mg via ORAL
  Filled 2015-02-16 (×5): qty 1

## 2015-02-16 NOTE — BHH Group Notes (Addendum)
   [facility] LCSW Aftercare Discharge Planning Group Note  02/16/2015  8:45 AM   Participation Quality: Alert, Appropriate and Oriented  Mood/Affect: Blunted  Depression Rating: 7  Anxiety Rating: Reports high level of anxiety  Thoughts of Suicide: Pt denies SI/HI  Will you contract for safety? Yes  Current AVH: Pt denies  Plan for Discharge/Comments: Pt attended discharge planning group and actively participated in group. CSW provided pt with today's workbook. Patient reports having difficulty sleeping and concerns regarding her medications. She is unsure of her living situation but does not plan on returning to her cousin's house. She would like a referral for outpatient services at discharge.  Transportation Means: Pt reports access to transportation  Supports: No supports mentioned at this time  Tilden Fossa, MSW, [employer] Clinical Social Worker [employer] [phone]

## 2015-02-16 NOTE — Progress Notes (Signed)
D: Pt presents with flat affect and depressed mood. Pt rates depression 7/10. Hopeless 5/10. Anxiety 9/10. Pt requested prn Vistaril for anxiety. Pt reported that she had little to no sleep last night. Pt stated that she's not on the right dose of Sinequan at bedtime. Pt stated that she takes 100 mg of Sinequan at home. Pt c/o ongoing knee/leg pain. Pt stated that she takes hydrocodone at home for pain. Pt denies suicidal thoughts and verbally contracts for safety. Pt compliant with taking meds and attending morning groups. A: Medications reviewed with pt. Medications administered as ordered per MD. Verbal support provided. Pt encouraged to attend groups. 15 minute checks performed for safety.  R: Pt stated goal "sleep". Pt verbalized understanding of med regimen. No adverse reactions to meds verbalized by pt. Pt receptive to tx.

## 2015-02-16 NOTE — BHH Group Notes (Signed)
[facility] LCSW Group Therapy 02/16/2015 1:15 PM Type of Therapy: Group Therapy Participation Level: Active  Participation Quality: Attentive, Sharing and Supportive  Affect: Blunted  Cognitive: Alert and Oriented  Insight: Developing/Improving and Engaged  Engagement in Therapy: Developing/Improving and Engaged  Modes of Intervention: Clarification, Confrontation, Discussion, Education, Exploration, Limit-setting, Orientation, Problem-solving, Rapport Building, Art therapist, Socialization and Support  Summary of Progress/Problems: The topic for today was feelings about relapse. Pt discussed what relapse prevention is to them and identified triggers that they are on the path to relapse. Pt processed their feeling towards relapse and was able to relate to peers. Pt discussed coping skills that can be used for relapse prevention. Patient discussed the numerous losses in her life and how it has impacted her illness. She discussed her goals with group which include reconnecting with others and finding meaningful activities in her life. CSW and other group members provided patient with emotional support and encouragement.   Tilden Fossa, MSW, [place] Clinical Social Worker [facility] [phone]

## 2015-02-16 NOTE — Progress Notes (Signed)
Recreation Therapy Notes  Date: 02.03.2017 Time: 9:30am Location: 300 Hall Group Room   Group Topic: Stress Management  Goal Area(s) Addresses:  Patient will actively participate in stress management techniques presented during session.   Behavioral Response: Appropriate   Intervention: Stress management techniques  Activity :  Deep Breathing and Progressive Muscle Relaxation. LRT provided instruction and demonstration on practice of Progressive Muscle Relaxation. Technique was coupled with deep breathing.   Education:  Stress Management, Discharge Planning.   Education Outcome: Acknowledges education  Clinical Observations/Feedback: Patient actively engaged in technique introduced, expressed no concerns and demonstrated ability to practice independently post d/c.   Laureen Ochs Tymier Lindholm, LRT/CTRS  Lane Hacker 02/16/2015 3:32 PM

## 2015-02-16 NOTE — Tx Team (Addendum)
Interdisciplinary Treatment Plan Update (Adult) Date: 02/16/15    Time Reviewed: 9:30 AM  Progress in Treatment: Attending groups: Yes Participating in groups: Yes Taking medication as prescribed: Yes Tolerating medication: Yes Family/Significant other contact made: Yes, CSW has spoken with daughter Patient understands diagnosis: Yes Discussing patient identified problems/goals with staff: Yes Medical problems stabilized or resolved: Yes Denies suicidal/homicidal ideation: Yes Issues/concerns per patient self-inventory: Yes Other:  New problem(s) identified: N/A  Discharge Plan or Barriers: Patient will stay in a motel or with family to follow up with outpatient services.   Reason for Continuation of Hospitalization:  Depression Anxiety Medication Stabilization   Comments: N/A  Estimated length of stay: 2-3 days    Patient is a 65 year old female admitted for depression and anxiety. Pt reports primary trigger(s) for admission are housing situation and grief. Patient will benefit from crisis stabilization, medication evaluation, group therapy and psycho education in addition to case management for discharge planning. At discharge, it is recommended that Pt remain compliant with established discharge plan and continued treatment.   Review of initial/current patient goals per problem list:  1. Goal(s): Patient will participate in aftercare plan   Met: Yes   Target date: 3-5 days post admission date   As evidenced by: Patient will participate within aftercare plan AEB aftercare provider and housing plan at discharge being identified.  1/31: Goal met. Patient plans to return home to follow up with outpatient services.    2. Goal (s): Patient will exhibit decreased depressive symptoms and suicidal ideations.   Met: Goal progressing   Target date: 3-5 days post admission date   As evidenced by: Patient will utilize self rating of depression at 3 or below and  demonstrate decreased signs of depression or be deemed stable for discharge by MD.  1/31: Goal not met: Pt presents with flat affect and depressed mood.  Pt admitted with depression rating of 10.  Pt to show decreased sign of depression and a rating of 3 or less before d/c.    2/3: Goal progressing. Patient rates depression at 7, denies SI.    3. Goal(s): Patient will demonstrate decreased signs and symptoms of anxiety.   Met: Goal progressing   Target date: 3-5 days post admission date   As evidenced by: Patient will utilize self rating of anxiety at 3 or below and demonstrated decreased signs of anxiety, or be deemed stable for discharge by MD  1/31: Goal not met: Pt presents with anxious mood and affect.  Pt admitted with anxiety rating of 10.  Pt to show decreased sign of anxiety and a rating of 3 or less before d/c.  2/3: Goal progressing. Patient rates anxiety high related to not sleeping well.   Attendees: Patient:    Family:    Physician: Dr. Parke Poisson; Dr. Sabra Heck 02/16/2015 9:30 AM  Nursing: Maureen Chatters, [address], Mayra Neer, RN 02/16/2015 9:30 AM  Clinical Social Worker: Tilden Fossa, LCSW 02/16/2015 9:30 AM  Other: Peri Maris, LCSWA; [place], LCSW  02/16/2015 9:30 AM  Other:  02/16/2015 9:30 AM  Other: Lars Pinks, Case Manager 02/16/2015 9:30 AM  Other: Agustina Caroli, May Augustin, NP 02/16/2015 9:30 AM  Other:             Scribe for Treatment Team:  Tilden Fossa, [place]

## 2015-02-16 NOTE — Progress Notes (Signed)
Referral made to [place] with patient's permission.  Tilden Fossa, LCSW Clinical Social Worker [facility] [phone]

## 2015-02-16 NOTE — Plan of Care (Signed)
Problem: Ineffective individual coping Goal: STG: Patient will remain free from self harm Outcome: Progressing Pt remains safe today, medications administered per MD orders

## 2015-02-16 NOTE — Progress Notes (Signed)
CSW provided patient with housing resources including a list of extended stay motels, low income apartments, and boarding houses.   Tilden Fossa, LCSW Clinical Social Worker [facility] [phone]

## 2015-02-16 NOTE — Progress Notes (Signed)
Patient ID: [name], female   DOB: 18-May-1950, 65 y.o.   MRN: [id number] [facility] MD Progress Note  02/16/2015 3:12 PM LORRINDA RAMSTAD  MRN:  [id number] Subjective:   Patient reports her mood remains depressed, although she does endorse partial improvement compared to how she felt prior to admission. She still is ruminative about how she feels she has become a burden to her family, particularly her adult daughter. Apparently daughter has offered her to move in with her , but patient reluctant, as she feels this would be very disruptive to daughter. Patient states " I really don't want to go back to where I was living , either, I would rather go to an extended stay hotel." Patient reports she slept poorly last night, and was sleeping better on Doxepin 100 mgrs HS, prior to admission.  Objective : I have discussed case with treatment team and have met with patient. Patient presents with partially improved mood - still depressed, constricted, but to lesser degree than on admission and does smile at times appropriately. She is also presenting more future oriented and, as above, is focusing more on disposition plans. Patient states that she plans to volunteer or even return to work on a part time basis after discharge. We discussed insomnia as above. Patient has slept better on Doxepin, at doses up to 100 mgrs QHS. At 50 mgrs QHS sleep is only partially improved. Due to reported side effects- dry mouth, blurry vision, and potential cardiac side effects ( patient has history of PAD ) , we discussed other alternative options. Patient states that Ambien may have been helpful and well tolerated in the past . Behavior on unit calm and in good control. Denies other medication side effects, other than as above .    Principal Problem:  Major Depression , Recurrent, no Psychotic Features  Diagnosis:   Patient Active Problem List   Diagnosis Date Noted  . MDD (major depressive disorder), recurrent episode,  severe ([place]) [F33.2] 02/13/2015  . Dysphagia [R13.10] 10/26/2014  . New onset of headaches after age 15 [R51] 09/19/2014  . Pulsatile tinnitus of left ear [id number]] 09/19/2014  . Blurry vision [H53.8] 09/19/2014  . Excessive daytime sleepiness [G47.19] 09/19/2014  . Morbid obesity ([place]) [E66.01] 09/19/2014  . Nocturnal headaches [R51] 09/19/2014  . Left-sided weakness [M62.89] 04/20/2014  . Tobacco use disorder [F17.200] 04/19/2014  . Blood in stool [K92.1] 01/30/2013  . Hematuria, gross [R31.0] 01/30/2013  . Intermittent claudication ([place]) [I73.9] 01/20/2013  . Eye mass [H57.8] 12/13/2012  . Cataract [H26.9] 12/13/2012  . Recurrent headache [R51] 12/13/2012  . Major depressive disorder, recurrent episode, severe ([place]) [F33.2] 10/29/2012  . Anxiety state, unspecified [F41.1] 09/20/2012  . Abnormality of gait [R26.9] 04/21/2012  . PAD (peripheral artery disease) ([place]) [I73.9] 04/15/2012  . Hyperlipidemia [E78.5] 04/15/2012  . Hypertension [I10] 04/15/2012  . Hypothyroidism [E03.9] 04/15/2012  . GERD (gastroesophageal reflux disease) [K21.9] 04/15/2012  . Routine health maintenance [Z00.00] 04/15/2012   Total Time spent with patient:  25 minutes     Past Medical History:  Past Medical History  Diagnosis Date  . Hypothyroidism   . Hypertension   . TIA (transient ischemic attack)   . PUD (peptic ulcer disease)   . HLD (hyperlipidemia)   . Depression   . GERD (gastroesophageal reflux disease)   . Diverticulosis   . PAD (peripheral artery disease) ([place])   . Anal fissure   . Anxiety   . Chronic headaches   . Cataract   .  Glaucoma   . Allergy     Past Surgical History  Procedure Laterality Date  . Total thyroidectomy    . Aortogram    . Iliac artery angioplasty and stenting Left 12/2012  . Sfa stent Left 2012    at [facility]  . Abdominal aortagram N/A 01/20/2013    Procedure: ABDOMINAL Maxcine Ham;  Surgeon: Conrad [place], MD;  Location: [facility] CATH LAB;   Service: Cardiovascular;  Laterality: N/A;  . Anal fissure repair    . Cataract extraction Left   . Breast reduction surgery    . Stomach surgery     Family History:  Family History  Problem Relation Age of Onset  . Peripheral vascular disease Mother   . Heart disease Mother   . Hypertension Mother   . AAA (abdominal aortic aneurysm) Father   . Hypertension Father   . Uterine cancer Sister     mets to brain  . Liver cancer Brother   . Stomach cancer Maternal Aunt     x2  . Stomach cancer Paternal Aunt   . Heart disease Brother   . Kidney cancer Brother   . Anuerysm Brother     brain  . HIV Brother     x2  . Aneurysm Father     Social History:  History  Alcohol Use No     History  Drug Use  . 1.00 per week  . Special: Cocaine, Marijuana    Comment: marijuana    Social History   Social History  . Marital Status: Widowed    Spouse Name: N/A  . Number of Children: 1  . Years of Education: 12   Occupational History  . Disabled     Social History Main Topics  . Smoking status: Current Every Day Smoker -- 0.50 packs/day for .5 years    Types: Cigarettes  . Smokeless tobacco: Never Used  . Alcohol Use: No  . Drug Use: 1.00 per week    Special: Cocaine, Marijuana     Comment: marijuana  . Sexual Activity: No   Other Topics Concern  . None   Social History Narrative   Live at home alone.   Caffeine use: Drinks coffee once/day   Soda- 2-3 times per day      Additional Social History:   Sleep: fair   Appetite:  Good  Current Medications: Current Facility-Administered Medications  Medication Dose Route Frequency Provider Last Rate Last Dose  . acetaminophen (TYLENOL) tablet 500 mg  500 mg Oral Q6H PRN Jenne Campus, MD   500 mg at 02/16/15 1011  . alum & mag hydroxide-simeth (MAALOX/MYLANTA) 200-200-20 MG/5ML suspension 30 mL  30 mL Oral Q4H PRN Laverle Hobby, PA-C   30 mL at 02/14/15 1308  . amLODipine (NORVASC) tablet 5 mg  5 mg Oral QHS  Jenne Campus, MD   5 mg at 02/15/15 2144  . ARIPiprazole (ABILIFY) tablet 2 mg  2 mg Oral Daily Jenne Campus, MD   2 mg at 02/16/15 0804  . clopidogrel (PLAVIX) tablet 75 mg  75 mg Oral Daily Laverle Hobby, PA-C   75 mg at 02/16/15 0804  . cyclobenzaprine (FLEXERIL) tablet 10 mg  10 mg Oral Q8H PRN Laverle Hobby, PA-C   10 mg at 02/16/15 0804  . hydrOXYzine (ATARAX/VISTARIL) tablet 25 mg  25 mg Oral Q6H PRN Laverle Hobby, PA-C   25 mg at 02/16/15 1435  . levothyroxine (SYNTHROID, LEVOTHROID) tablet  112 mcg  112 mcg Oral QAC breakfast Laverle Hobby, PA-C   112 mcg at 02/16/15 0636  . lisinopril (PRINIVIL,ZESTRIL) tablet 40 mg  40 mg Oral Daily Laverle Hobby, PA-C   40 mg at 02/16/15 0804  . magnesium hydroxide (MILK OF MAGNESIA) suspension 30 mL  30 mL Oral Daily PRN Laverle Hobby, PA-C      . pantoprazole (PROTONIX) EC tablet 40 mg  40 mg Oral Daily Laverle Hobby, PA-C   40 mg at 02/16/15 0636  . sertraline (ZOLOFT) tablet 100 mg  100 mg Oral Daily Jenne Campus, MD   100 mg at 02/16/15 0803  . zolpidem (AMBIEN) tablet 5 mg  5 mg Oral QHS Jenne Campus, MD        Lab Results:  Results for orders placed or performed during the hospital encounter of 02/13/15 (from the past 48 hour(s))  TSH     Status: Abnormal   Collection Time: 02/15/15  6:45 AM  Result Value Ref Range   TSH 5.369 (H) 0.350 - 4.500 uIU/mL    Comment: Performed at [facility]    Physical Findings: AIMS: Facial and Oral Movements Muscles of Facial Expression: None, normal Lips and Perioral Area: None, normal Jaw: None, normal Tongue: None, normal,Extremity Movements Upper (arms, wrists, hands, fingers): None, normal Lower (legs, knees, ankles, toes): None, normal, Trunk Movements Neck, shoulders, hips: None, normal, Overall Severity Severity of abnormal movements (highest score from questions above): None, normal Incapacitation due to abnormal movements: None,  normal Patient's awareness of abnormal movements (rate only patient's report): No Awareness, Dental Status Current problems with teeth and/or dentures?: No Does patient usually wear dentures?: No  CIWA:    COWS:     Musculoskeletal: Strength & Muscle Tone: within normal limits Gait & Station: normal Patient leans: N/A  Psychiatric Specialty Exam: ROS  Denies blurry vision at this time, denies chest pain, no SOB. Denies any active bleeding at this time ( is on Plavix)   Blood pressure 111/61, pulse 77, temperature 97.7 F (36.5 C), temperature source Oral, resp. rate 16, height 5' 1"  (1.549 m), weight 206 lb (93.441 kg).Body mass index is 38.94 kg/(m^2).  General Appearance: improved grooming   Eye Contact::  Good  Speech:  Normal Rate  Volume:  Normal  Mood:  Partial improvement but reports still has some depression  Affect:   Less constricted than on admission  Thought Process:  Linear  Orientation:  Other:  fully alert and attentive   Thought Content:  denies hallicinations, no delusions   Suicidal Thoughts:  Today denies SI or any self injurious ideations, contracts for safety on unit   Homicidal Thoughts:  No  Memory:  recent and remote grossly intact   Judgement:  Other:  improved   Insight:  improving   Psychomotor Activity:  Normal- more visible on unit   Concentration:  Good  Recall:  Good  Fund of Knowledge:Good  Language: Good  Akathisia:  Negative  Handed:  Right  AIMS (if indicated):     Assets:  Desire for Improvement Resilience  ADL's:   Improving   Cognition: WNL  Sleep:  Number of Hours: 4.5  Assessment-    Patient's mood and affect are still depressed, constricted overall , but  improving compared to admission. No SI, and seems more future oriented .  Tolerating Zoloft and Abilify well. She is sleeping sub-optimally with lower Doxepin doses, but there is concern about side effects  at higher doses, so we discussed switching to another medication to help with  insomnia. Agrees to Ambien, which she states has been helpful .     Treatment Plan Summary: Daily contact with patient to assess and evaluate symptoms and progress in treatment, Medication management, Plan inpatient treatment  and medications as below  Continue to encourage group, milieu participation to work on coping skills and symptom reduction Continue to encourage and support smoking cessation efforts - patient states she currently feels unready to stop smoking- has history of PAD. Continue Zoloft   100 mgrs QDAY for depression Continue Abilify 2 mgrs  QDAY as an adjunct  for management of depression- side effects and rationale reviewed D/C Doxepin  Start Ambien 5 mgrs QHS PRN for insomnia as needed  Continue Plavix for history of vascular disease   Janiece Scovill, MD 02/16/2015, 3:12 PM

## 2015-02-17 NOTE — Progress Notes (Signed)
Patient ID: [name], female   DOB: 01/28/1950, 65 y.o.   MRN: [id number] D: Patient reports she made a lot of strides by going to dinner and sitting in dayroom with peers. Pt reports hx of agoraphobia and claustrophobia which is also making her more depressed since the death of her husband who did everything for her. Pt reports tolerating medication well. Pt denies SI/HI/AVH. Cooperative with assessment.   A: Met with pt 1:1. Medications administered as prescribed. Support and encouragement provided. R: Patient remains safe and complaint with medications.

## 2015-02-17 NOTE — Plan of Care (Signed)
Problem: Diagnosis: Increased Risk For Suicide Attempt Goal: STG-Patient Will Attend All Groups On The Unit Outcome: Progressing Patient attended evening wrap up group.     

## 2015-02-17 NOTE — Plan of Care (Signed)
Problem: Alteration in mood; excessive anxiety as evidenced by: Goal: STG-Pt will report an absence of self-harm thoughts/actions (Patient will report an absence of self-harm thoughts or actions)  Outcome: Progressing Patient currently denies suicidal ideations.

## 2015-02-17 NOTE — Progress Notes (Signed)
Writer spoke with patient 1:1 and she reports that her day was okay. She reports that when she first came here she did not want to live but now she wants to live. She reports that she has 2 friends and her daughter are her support system that she plans to use. She is considering grief counseling once discharged and will be attending outpatient therapy. She reports that it has been difficult since her husband died and she felt like giving up on life at first but now she wants to live. She denies si/hi/a/v hallucinations. Support given and safety maintained on unit with 15 min checks.

## 2015-02-17 NOTE — BHH Group Notes (Signed)
[place] Group Notes:  (Nursing/MHT/Case Management/Adjunct)  Date:  02/17/2015  Time:  11:04 AM  Type of Therapy:  Psychoeducational Skills  Coping Skills  Participation Level:  Did Not Attend  Participation Quality:  N/A  Affect:  N/A  Cognitive:  N/A  Insight:  None  Engagement in Group:  N/A  Modes of Intervention:  Discussion, Education and Exploration  Summary of Progress/Problems: Patient did not attend Group despite strong encouragement from staff.  Marjean Donna 02/17/2015, 11:04 AM

## 2015-02-17 NOTE — Progress Notes (Signed)
Patient ID: [name], female   DOB: 26-Dec-1950, 65 y.o.   MRN: [id number] Pt presents with depressed mood, affect blunted. [name] reports she is anxious and feeling depressed today. She states she continues to feel significant anxiety around any other people and has continued to be isolative. She states she was unable to go to breakfast from anxiety. She completed self inventory and rates her hopelessness, anxiety and depression at 5/10 on scale, 10 being worst, 1 being least. She states her goal for today is to stay longer in the groups and spend more time in dayroom as she recognizes she self isolates. A. Medications given as ordered. Support encouragement provided. Prn vistaril given for anxiety with good results. R. Pt is safe, will con't to monitor q 15 minutes for safety.

## 2015-02-17 NOTE — Progress Notes (Signed)
Adult Psychoeducational Group Note  Date:  02/17/2015 Time:  9:54 PM  Group Topic/Focus:  Wrap-Up Group:   The focus of this group is to help patients review their daily goal of treatment and discuss progress on daily workbooks.  Participation Level:  Active  Participation Quality:  Appropriate and Attentive  Affect:  Appropriate  Cognitive:  Appropriate  Insight: Appropriate  Engagement in Group:  Engaged  Modes of Intervention:  Discussion  Additional Comments:  Pt stated her goal was to stay out of her room. Pt states she has some social anxiety issues, but was able to make it to lunch and dinner today. Pt stated tomorrow she wants to participate in more groups and go to lunch and dinner.Pt stated one positive thing is she was able to keep her anxiety under her control.  Clint Bolder 02/17/2015, 9:54 PM

## 2015-02-17 NOTE — Progress Notes (Signed)
[facility] MD Progress Note  02/17/2015 4:48 PM [name]  MRN:  [id number] Subjective:   Patient reports " I was having a good day until I got a phone call from my roommate who is requesting that I pay rent for the month of February and I' ve been in the hospital for the past 1 week or so."   Objective:is awake, alert and oriented X3 , found attending group session.  Denies suicidal or homicidal ideation. Denies auditory or visual hallucination and does not appear to be responding to internal stimuli. Patient reports interacting well with staff and others. Patient reports she is medication compliant without mediation side effects. States her depression 2/10. States she feels like the medication is helping now that the doctor made changes. Patient states " I was having a good day until I got a phone call from my roommate who is requesting that I pay rent for the month of February and I' ve been in the hospital for the past 1 week or so."  States " I am already dealing with a lot. And I don't need this added stress." Reports fair appetite and states that she is resting well now that she is taken Ambien to help with sleep. Support, encouragement and reassurance was provided.     Principal Problem:  Major Depression , Recurrent, no Psychotic Features  Diagnosis:   Patient Active Problem List   Diagnosis Date Noted  . MDD (major depressive disorder), recurrent episode, severe ([place]) [F33.2] 02/13/2015  . Dysphagia [R13.10] 10/26/2014  . New onset of headaches after age 11 [R51] 09/19/2014  . Pulsatile tinnitus of left ear P161950.A2] 09/19/2014  . Blurry vision [H53.8] 09/19/2014  . Excessive daytime sleepiness [G47.19] 09/19/2014  . Morbid obesity ([place]) [E66.01] 09/19/2014  . Nocturnal headaches [R51] 09/19/2014  . Left-sided weakness [M62.89] 04/20/2014  . Tobacco use disorder [F17.200] 04/19/2014  . Blood in stool [K92.1] 01/30/2013  . Hematuria, gross [R31.0] 01/30/2013  . Intermittent  claudication ([place]) [I73.9] 01/20/2013  . Eye mass [H57.8] 12/13/2012  . Cataract [H26.9] 12/13/2012  . Recurrent headache [R51] 12/13/2012  . Major depressive disorder, recurrent episode, severe ([place]) [F33.2] 10/29/2012  . Anxiety state, unspecified [F41.1] 09/20/2012  . Abnormality of gait [R26.9] 04/21/2012  . PAD (peripheral artery disease) ([place]) [I73.9] 04/15/2012  . Hyperlipidemia [E78.5] 04/15/2012  . Hypertension [I10] 04/15/2012  . Hypothyroidism [E03.9] 04/15/2012  . GERD (gastroesophageal reflux disease) [K21.9] 04/15/2012  . Routine health maintenance [Z00.00] 04/15/2012   Total Time spent with patient:  25 minutes     Past Medical History:  Past Medical History  Diagnosis Date  . Hypothyroidism   . Hypertension   . TIA (transient ischemic attack)   . PUD (peptic ulcer disease)   . HLD (hyperlipidemia)   . Depression   . GERD (gastroesophageal reflux disease)   . Diverticulosis   . PAD (peripheral artery disease) ([place])   . Anal fissure   . Anxiety   . Chronic headaches   . Cataract   . Glaucoma   . Allergy     Past Surgical History  Procedure Laterality Date  . Total thyroidectomy    . Aortogram    . Iliac artery angioplasty and stenting Left 12/2012  . Sfa stent Left 2012    at [facility]  . Abdominal aortagram N/A 01/20/2013    Procedure: ABDOMINAL Maxcine Ham;  Surgeon: Conrad Adams, MD;  Location: [facility] CATH LAB;  Service: Cardiovascular;  Laterality: N/A;  .  Anal fissure repair    . Cataract extraction Left   . Breast reduction surgery    . Stomach surgery     Family History:  Family History  Problem Relation Age of Onset  . Peripheral vascular disease Mother   . Heart disease Mother   . Hypertension Mother   . AAA (abdominal aortic aneurysm) Father   . Hypertension Father   . Uterine cancer Sister     mets to brain  . Liver cancer Brother   . Stomach cancer Maternal Aunt     x2  . Stomach cancer Paternal Aunt   . Heart  disease Brother   . Kidney cancer Brother   . Anuerysm Brother     brain  . HIV Brother     x2  . Aneurysm Father     Social History:  History  Alcohol Use No     History  Drug Use  . 1.00 per week  . Special: Cocaine, Marijuana    Comment: marijuana    Social History   Social History  . Marital Status: Widowed    Spouse Name: N/A  . Number of Children: 1  . Years of Education: 12   Occupational History  . Disabled     Social History Main Topics  . Smoking status: Current Every Day Smoker -- 0.50 packs/day for .5 years    Types: Cigarettes  . Smokeless tobacco: Never Used  . Alcohol Use: No  . Drug Use: 1.00 per week    Special: Cocaine, Marijuana     Comment: marijuana  . Sexual Activity: No   Other Topics Concern  . None   Social History Narrative   Live at home alone.   Caffeine use: Drinks coffee once/day   Soda- 2-3 times per day      Additional Social History:   Sleep: fair   Appetite:  Good  Current Medications: Current Facility-Administered Medications  Medication Dose Route Frequency Provider Last Rate Last Dose  . acetaminophen (TYLENOL) tablet 500 mg  500 mg Oral Q6H PRN Jenne Campus, MD   500 mg at 02/17/15 1254  . alum & mag hydroxide-simeth (MAALOX/MYLANTA) 200-200-20 MG/5ML suspension 30 mL  30 mL Oral Q4H PRN Laverle Hobby, PA-C   30 mL at 02/14/15 1308  . amLODipine (NORVASC) tablet 5 mg  5 mg Oral QHS Jenne Campus, MD   5 mg at 02/16/15 2108  . ARIPiprazole (ABILIFY) tablet 2 mg  2 mg Oral Daily Jenne Campus, MD   2 mg at 02/17/15 0753  . clopidogrel (PLAVIX) tablet 75 mg  75 mg Oral Daily Laverle Hobby, PA-C   75 mg at 02/17/15 [id number]  . cyclobenzaprine (FLEXERIL) tablet 10 mg  10 mg Oral Q8H PRN Laverle Hobby, PA-C   10 mg at 02/17/15 1254  . hydrOXYzine (ATARAX/VISTARIL) tablet 25 mg  25 mg Oral Q6H PRN Laverle Hobby, PA-C   25 mg at 02/17/15 1629  . levothyroxine (SYNTHROID, LEVOTHROID) tablet 112 mcg  112 mcg  Oral QAC breakfast Laverle Hobby, PA-C   112 mcg at 02/17/15 [id number]  . lisinopril (PRINIVIL,ZESTRIL) tablet 40 mg  40 mg Oral Daily Laverle Hobby, PA-C   40 mg at 02/17/15 [id number]  . magnesium hydroxide (MILK OF MAGNESIA) suspension 30 mL  30 mL Oral Daily PRN Laverle Hobby, PA-C      . pantoprazole (PROTONIX) EC tablet 40 mg  40 mg Oral Daily Laverle Hobby,  PA-C   40 mg at 02/17/15 0653  . sertraline (ZOLOFT) tablet 100 mg  100 mg Oral Daily Jenne Campus, MD   100 mg at 02/17/15 [id number]  . zolpidem (AMBIEN) tablet 5 mg  5 mg Oral QHS Jenne Campus, MD   5 mg at 02/16/15 2108    Lab Results:  No results found for this or any previous visit (from the past 48 hour(s)).  Physical Findings: AIMS: Facial and Oral Movements Muscles of Facial Expression: None, normal Lips and Perioral Area: None, normal Jaw: None, normal Tongue: None, normal,Extremity Movements Upper (arms, wrists, hands, fingers): None, normal Lower (legs, knees, ankles, toes): None, normal, Trunk Movements Neck, shoulders, hips: None, normal, Overall Severity Severity of abnormal movements (highest score from questions above): None, normal Incapacitation due to abnormal movements: None, normal Patient's awareness of abnormal movements (rate only patient's report): No Awareness, Dental Status Current problems with teeth and/or dentures?: No Does patient usually wear dentures?: No  CIWA:    COWS:     Musculoskeletal: Strength & Muscle Tone: within normal limits Gait & Station: normal Patient leans: N/A  Psychiatric Specialty Exam: Review of Systems  Psychiatric/Behavioral: Positive for depression. Negative for suicidal ideas and hallucinations. The patient is nervous/anxious.   All other systems reviewed and are negative.   Blood pressure 109/71, pulse 88, temperature 98.1 F (36.7 C), temperature source Oral, resp. rate 18, height 5\' 1"  (1.549 m), weight 93.441 kg (206 lb).Body mass index is 38.94 kg/(m^2).   General Appearance: Casual, pleasant, calm and cooperative    Eye Contact::  Good  Speech:  Normal Rate  Volume:  Normal  Mood:  Depression, flat  Affect; congruent  Thought Process:  Linear  Orientation:  Other:  fully alert and attentive   Thought Content:  denies hallicinations, no delusions   Suicidal Thoughts:  Today denies SI or any self injurious ideations, contracts for safety on unit   Homicidal Thoughts:  No  Memory:  recent and remote grossly intact   Judgement:  Other:  improved   Insight:  improving   Psychomotor Activity:  Normal and Restlessness  Concentration:  Good  Recall:  Good  Fund of Knowledge:Good  Language: Good  Akathisia:  Negative  Handed:  Right  AIMS (if indicated):     Assets:  Desire for Improvement Financial Resources/Insurance Housing Resilience Social Support  ADL's:   Improving   Cognition: WNL  Sleep:  Number of Hours: 4.5     I agree with current treatment plan on 02/17/2015, Patient seen face-to-face for psychiatric evaluation follow-up, chart reviewed. Reviewed the information documented and agree with the treatment plan.  Treatment Plan Summary: Daily contact with patient to assess and evaluate symptoms and progress in treatment, Medication management, Plan inpatient treatment  and medications as below  Continue to encourage group, milieu participation to work on coping skills and symptom reduction Continue to encourage and support smoking cessation efforts - patient states she currently feels unready to stop smoking- has history of PAD. Continue Zoloft   100 mgrs QDAY for depression Continue Abilify 2 mgrs  QDAY as an adjunct  for management of depression- side effects and rationale reviewed D/C Doxepin  Continue Ambien 5 mgrs QHS PRN for insomnia as needed  Continue Plavix for history of vascular disease   Derrill Center, NP 02/17/2015, 4:48 PM  Patient seen face to face for psychiatric evaluation. Chart reviewed and finding  discussed with Physician extender. Agreed with disposition and treatment plan.  Berniece Andreas, MD

## 2015-02-17 NOTE — BHH Group Notes (Signed)
[place] LCSW Group Therapy Note  02/17/2015 10 AM  Type of Therapy and Topic:  Group Therapy: Avoiding Self-Sabotaging and Enabling Behaviors  Participation Level:  Active   Description of Group:     Learn how to identify obstacles, self-sabotaging and enabling behaviors, what are they, why do we do them and what needs do these behaviors meet? Discuss unhealthy relationships and how to have positive healthy boundaries with those that sabotage and enable. Explore aspects of self-sabotage and enabling in yourself and how to limit these self-destructive behaviors in everyday life. A scaling question is used to help patient look at where they are now in their motivation to change.    Therapeutic Goals: 1. Patient will identify one obstacle that relates to self-sabotage and enabling behaviors 2. Patient will identify one personal self-sabotaging or enabling behavior they did prior to admission 3. Patient able to establish a plan to change the above identified behavior they did prior to admission:  4. Patient will demonstrate ability to communicate their needs through discussion and/or role plays.   Summary of Patient Progress: The main focus of today's process group was to explain to what "self-sabotage" means and use Motivational Interviewing to discuss what benefits, negative or positive, were involved in a self-identified self-sabotaging behavior. We then talked about reasons the patient may want to change the behavior and their current desire to change. The Stages of Change were explained using a handout, and patients identified where they currently are with regard to stages of change. [name] engaged easily and shared that she is "usually the person everyone else comes to for help, not the one that needs it." Patient reports asking for help was most difficult but she feels like it is necessary. States in both preparation and action stages.     Therapeutic Modalities:   Cognitive Behavioral  Therapy Person-Centered Therapy Motivational Interviewing   Sheilah Pigeon, LCSW

## 2015-02-17 NOTE — Plan of Care (Signed)
Problem: Diagnosis: Increased Risk For Suicide Attempt Goal: STG-Patient Will Comply With Medication Regime Outcome: Progressing Pt compliant with medication this evening

## 2015-02-18 NOTE — Progress Notes (Signed)
[facility] MD Progress Note  02/18/2015 11:04 AM [name]  MRN:  [id number] Subjective:   Patient reports " I was able to reach one goal that I set for myself, I was able to interact with my peers, patient reports racing thoughts."   Objective:is awake, alert and oriented X3 , found attending group session.  Denies suicidal or homicidal ideation. Denies auditory or visual hallucination and does not appear to be responding to internal stimuli. Patient reports interacting well with staff and others. Patient reports she is medication compliant without mediation side effects. States her depression 7/10.  Patient states" I think people are judging all the time and I know I cant make it on the outside with these thoughts". Patient reports increased anxiety today and states " I will just stay to myself all day"  States " I am still dealing with a lot and I have spoken to the social worker about my living situation.  Reports fair appetite and states that she is resting well now that she is taken Ambien to help with sleep. Support, encouragement and reassurance was provided.     Principal Problem:  Major Depression , Recurrent, no Psychotic Features  Diagnosis:   Patient Active Problem List   Diagnosis Date Noted  . MDD (major depressive disorder), recurrent episode, severe ([place]) [F33.2] 02/13/2015  . Dysphagia [R13.10] 10/26/2014  . New onset of headaches after age 21 [R51] 09/19/2014  . Pulsatile tinnitus of left ear [id number]] 09/19/2014  . Blurry vision [H53.8] 09/19/2014  . Excessive daytime sleepiness [G47.19] 09/19/2014  . Morbid obesity ([place]) [E66.01] 09/19/2014  . Nocturnal headaches [R51] 09/19/2014  . Left-sided weakness [M62.89] 04/20/2014  . Tobacco use disorder [F17.200] 04/19/2014  . Blood in stool [K92.1] 01/30/2013  . Hematuria, gross [R31.0] 01/30/2013  . Intermittent claudication ([place]) [I73.9] 01/20/2013  . Eye mass [H57.8] 12/13/2012  . Cataract [H26.9] 12/13/2012  . Recurrent  headache [R51] 12/13/2012  . Major depressive disorder, recurrent episode, severe ([place]) [F33.2] 10/29/2012  . Anxiety state, unspecified [F41.1] 09/20/2012  . Abnormality of gait [R26.9] 04/21/2012  . PAD (peripheral artery disease) ([place]) [I73.9] 04/15/2012  . Hyperlipidemia [E78.5] 04/15/2012  . Hypertension [I10] 04/15/2012  . Hypothyroidism [E03.9] 04/15/2012  . GERD (gastroesophageal reflux disease) [K21.9] 04/15/2012  . Routine health maintenance [Z00.00] 04/15/2012   Total Time spent with patient:  25 minutes     Past Medical History:  Past Medical History  Diagnosis Date  . Hypothyroidism   . Hypertension   . TIA (transient ischemic attack)   . PUD (peptic ulcer disease)   . HLD (hyperlipidemia)   . Depression   . GERD (gastroesophageal reflux disease)   . Diverticulosis   . PAD (peripheral artery disease) ([place])   . Anal fissure   . Anxiety   . Chronic headaches   . Cataract   . Glaucoma   . Allergy     Past Surgical History  Procedure Laterality Date  . Total thyroidectomy    . Aortogram    . Iliac artery angioplasty and stenting Left 12/2012  . Sfa stent Left 2012    at [facility]  . Abdominal aortagram N/A 01/20/2013    Procedure: ABDOMINAL Maxcine Ham;  Surgeon: Conrad York Haven, MD;  Location: [facility] CATH LAB;  Service: Cardiovascular;  Laterality: N/A;  . Anal fissure repair    . Cataract extraction Left   . Breast reduction surgery    . Stomach surgery     Family History:  Family History  Problem Relation Age of Onset  . Peripheral vascular disease Mother   . Heart disease Mother   . Hypertension Mother   . AAA (abdominal aortic aneurysm) Father   . Hypertension Father   . Uterine cancer Sister     mets to brain  . Liver cancer Brother   . Stomach cancer Maternal Aunt     x2  . Stomach cancer Paternal Aunt   . Heart disease Brother   . Kidney cancer Brother   . Anuerysm Brother     brain  . HIV Brother     x2  . Aneurysm  Father     Social History:  History  Alcohol Use No     History  Drug Use  . 1.00 per week  . Special: Cocaine, Marijuana    Comment: marijuana    Social History   Social History  . Marital Status: Widowed    Spouse Name: N/A  . Number of Children: 1  . Years of Education: 12   Occupational History  . Disabled     Social History Main Topics  . Smoking status: Current Every Day Smoker -- 0.50 packs/day for .5 years    Types: Cigarettes  . Smokeless tobacco: Never Used  . Alcohol Use: No  . Drug Use: 1.00 per week    Special: Cocaine, Marijuana     Comment: marijuana  . Sexual Activity: No   Other Topics Concern  . None   Social History Narrative   Live at home alone.   Caffeine use: Drinks coffee once/day   Soda- 2-3 times per day      Additional Social History:   Sleep: fair   Appetite:  Good  Current Medications: Current Facility-Administered Medications  Medication Dose Route Frequency Provider Last Rate Last Dose  . acetaminophen (TYLENOL) tablet 500 mg  500 mg Oral Q6H PRN Jenne Campus, MD   500 mg at 02/17/15 2103  . alum & mag hydroxide-simeth (MAALOX/MYLANTA) 200-200-20 MG/5ML suspension 30 mL  30 mL Oral Q4H PRN Laverle Hobby, PA-C   30 mL at 02/14/15 1308  . amLODipine (NORVASC) tablet 5 mg  5 mg Oral QHS Jenne Campus, MD   5 mg at 02/17/15 2101  . ARIPiprazole (ABILIFY) tablet 2 mg  2 mg Oral Daily Jenne Campus, MD   2 mg at 02/18/15 [id number]  . clopidogrel (PLAVIX) tablet 75 mg  75 mg Oral Daily Laverle Hobby, PA-C   75 mg at 02/18/15 [id number]  . cyclobenzaprine (FLEXERIL) tablet 10 mg  10 mg Oral Q8H PRN Laverle Hobby, PA-C   10 mg at 02/17/15 2101  . hydrOXYzine (ATARAX/VISTARIL) tablet 25 mg  25 mg Oral Q6H PRN Laverle Hobby, PA-C   25 mg at 02/18/15 0513  . levothyroxine (SYNTHROID, LEVOTHROID) tablet 112 mcg  112 mcg Oral QAC breakfast Laverle Hobby, PA-C   112 mcg at 02/18/15 [id number]  . lisinopril (PRINIVIL,ZESTRIL) tablet 40 mg   40 mg Oral Daily Laverle Hobby, PA-C   40 mg at 02/18/15 [id number]  . magnesium hydroxide (MILK OF MAGNESIA) suspension 30 mL  30 mL Oral Daily PRN Laverle Hobby, PA-C      . pantoprazole (PROTONIX) EC tablet 40 mg  40 mg Oral Daily Laverle Hobby, PA-C   40 mg at 02/18/15 [id number]  . sertraline (ZOLOFT) tablet 100 mg  100 mg Oral Daily Jenne Campus, MD   100 mg at  02/18/15 0821  . zolpidem (AMBIEN) tablet 5 mg  5 mg Oral QHS Jenne Campus, MD   5 mg at 02/17/15 2101    Lab Results:  No results found for this or any previous visit (from the past 48 hour(s)).  Physical Findings: AIMS: Facial and Oral Movements Muscles of Facial Expression: None, normal Lips and Perioral Area: None, normal Jaw: None, normal Tongue: None, normal,Extremity Movements Upper (arms, wrists, hands, fingers): None, normal Lower (legs, knees, ankles, toes): None, normal, Trunk Movements Neck, shoulders, hips: None, normal, Overall Severity Severity of abnormal movements (highest score from questions above): None, normal Incapacitation due to abnormal movements: None, normal Patient's awareness of abnormal movements (rate only patient's report): No Awareness, Dental Status Current problems with teeth and/or dentures?: No Does patient usually wear dentures?: No  CIWA:    COWS:     Musculoskeletal: Strength & Muscle Tone: within normal limits Gait & Station: normal Patient leans: N/A  Psychiatric Specialty Exam: Review of Systems  Psychiatric/Behavioral: Positive for depression. Negative for suicidal ideas and hallucinations. The patient is nervous/anxious.   All other systems reviewed and are negative.   Blood pressure 103/70, pulse 79, temperature 98 F (36.7 C), temperature source Oral, resp. rate 18, height 5\' 1"  (1.549 m), weight 93.441 kg (206 lb).Body mass index is 38.94 kg/(m^2).  General Appearance: Casual, pleasant, calm and cooperative    Eye Contact::  Good  Speech:  Normal Rate  Volume:   Normal  Mood:  Depression, flat  Affect; congruent  Thought Process:  Linear  Orientation:  Other:  fully alert and attentive   Thought Content:  Rumination and with symptoms of worries with current living situation.  Suicidal Thoughts:  Today denies SI or any self injurious ideations, contracts for safety on unit   Homicidal Thoughts:  No  Memory:  recent and remote grossly intact   Judgement:  Other:  improved   Insight:  Lacking and improving   Psychomotor Activity:  Restlessness  Concentration:  Fair  Recall:  [place] of Knowledge:Good  Language: Good  Akathisia:  Negative  Handed:  Right  AIMS (if indicated):     Assets:  Desire for Improvement Financial Resources/Insurance Housing Resilience Social Support  ADL's:   Improving   Cognition: WNL  Sleep:  Number of Hours: 5.75     I agree with current treatment plan on 02/18/2015, Patient seen face-to-face for psychiatric evaluation follow-up, chart reviewed. Reviewed the information documented and agree with the treatment plan.  Treatment Plan Summary: Daily contact with patient to assess and evaluate symptoms and progress in treatment, Medication management, Plan inpatient treatment  and medications as below  Continue to encourage group, milieu participation to work on coping skills and symptom reduction Continue to encourage and support smoking cessation efforts - patient states she currently feels unready to stop smoking- has history of PAD. Continue Zoloft   100 mgrs QDAY for depression Continue Abilify 2 mgrs  QDAY as an adjunct  for management of depression- side effects and rationale reviewed D/C Doxepin  Start Vistaril 25 mg Po Q6 for Anxiety/ Mood stabilization Continue Ambien 5 mgrs QHS PRN for insomnia as needed  Continue Plavix for history of vascular disease   Derrill Center, NP 02/18/2015, 11:04 AM  Patient seen face to face for psychiatric evaluation. Chart reviewed and finding discussed with Physician  extender. Agreed with disposition and treatment plan.   Berniece Andreas, MD

## 2015-02-18 NOTE — BHH Group Notes (Signed)
[place] LCSW Group Therapy Note   02/18/2015  10 AM   Type of Therapy and Topic: Group Therapy: Feelings Around Returning Home & Establishing a Supportive Framework and Activity to Identify signs of Improvement or Decompensation   Participation Level:  Active   Description of Group:  Patients first processed thoughts and feelings about up coming discharge. These included fears of upcoming changes, lack of change, new living environments, judgements and expectations from others and overall stigma of MH issues. We then discussed what is a supportive framework? What does it look like feel like and how do I discern it from and unhealthy non-supportive network? Learn how to cope when supports are not helpful and don't support you. Discuss what to do when your family/friends are not supportive.   Therapeutic Goals Addressed in Processing Group:  1. Patient will identify one healthy supportive network that they can use at discharge. 2. Patient will identify one factor of a supportive framework and how to tell it from an unhealthy network. 3. Patient able to identify one coping skill to use when they do not have positive supports from others. 4. Patient will demonstrate ability to communicate their needs through discussion and/or role plays. 5. Patient will identify signs of decompensation in addition to recovery  Summary of Patient Progress:  Pt engaged only when asked direct questions during group session today. As patients processed their anxiety about discharge and described healthy supports patient remained quiet but did participate in group activity. Patient chose a visual to represent decompensation as 'staying behind closed doors and feeling overwhelmed' and improvement as "seeing God's handiwork in nature."  Sheilah Pigeon, LCSW

## 2015-02-18 NOTE — Progress Notes (Signed)
Patient could not go to dining room for dinner because she had been in dayroom with peers talking/laughing.  After dinner was in dayroom while patient's watching ballgame and upset again.  Vistaril given for anxiety.  Patient encouraged to go to her room, rest, and not return to dayroom while peers are watching game, etc.  Patient agreed to go to her room.  Respirations even and unlabored.  No signs/symptoms of pain/distress noted on patient's face/body movement.  Safety maintained with 15 minute checks.

## 2015-02-18 NOTE — Progress Notes (Signed)
D:  Patient's self inventory sheet, patient sleeps good, sleep medication is helpful.  Fair appetite, low energy level, unsure of concentration.  Rated depression, hopeless and anxiety #5.  Denied withdrawals.  Denied SI.  Has experienced headaches, legs and knees are painful.  Worst pain #4 in past 24 hours.  Pain medications are not helpful.  Goal is to get dressed and eat in cafeteria.   Plan is to not be fearful.   A:  Medications administered per MD orders.  Emotional support and encouragement given patient. R:   Patient denied SI and HI, contracts for safety.  Denied A/V hallucinations.  Safety maintained with 15 minute checks.

## 2015-02-18 NOTE — BHH Group Notes (Signed)
[facility] Group Notes:  Healthy coping skills  Date:  02/18/2015  Time:  1300  Type of Therapy:  Nurse Education  Participation Level:  Active  Participation Quality:  Appropriate  Affect:  Appropriate  Cognitive:  Appropriate  Insight:  Appropriate  Engagement in Group:  Engaged  Modes of Intervention:  Discussion  Summary of Progress/Problems:  Delman Kitten 02/18/2015, 4:35 PM

## 2015-02-18 NOTE — Plan of Care (Signed)
Problem: Consults Goal: Depression Patient Education See Patient Education Module for education specifics.  Outcome: Progressing Nurse discussed depression/coping skills with patient.        

## 2015-02-19 MED ORDER — SERTRALINE HCL 50 MG PO TABS
150.0000 mg | ORAL_TABLET | Freq: Every day | ORAL | Status: DC
Start: 2015-02-20 — End: 2015-02-21
  Administered 2015-02-20 – 2015-02-21 (×2): 150 mg via ORAL
  Filled 2015-02-19 (×5): qty 3

## 2015-02-19 MED ORDER — NICOTINE POLACRILEX 2 MG MT GUM: 2.0000 mg | CHEWING_GUM | OROMUCOSAL | Status: DC | PRN

## 2015-02-19 MED ORDER — ACETAMINOPHEN 500 MG PO TABS
1000.0000 mg | ORAL_TABLET | Freq: Three times a day (TID) | ORAL | Status: DC | PRN
  Administered 2015-02-19 – 2015-02-21 (×4): 1000 mg via ORAL
  Filled 2015-02-19 (×3): qty 2

## 2015-02-19 NOTE — BHH Group Notes (Signed)
[facility] LCSW Group Therapy 02/19/2015  1:15 pm   Type of Therapy: Group Therapy Participation Level: Active  Participation Quality: Attentive, Sharing and Supportive  Affect: Blunted  Cognitive: Alert and Oriented  Insight: Developing/Improving and Engaged  Engagement in Therapy: Developing/Improving and Engaged  Modes of Intervention: Clarification, Confrontation, Discussion, Education, Exploration, Limit-setting, Orientation, Problem-solving, Rapport Building, Art therapist, Socialization and Support  Summary of Progress/Problems: The topic for group was balance in life. Today's group focused on defining balance in one's own words, identifying things that can knock one off balance, and exploring healthy ways to maintain balance in life. Group members were asked to provide an example of a time when they felt off balance, describe how they handled that situation,and process healthier ways to regain balance in the future. Group members were asked to share the most important tool for maintaining balance that they learned while at [facility] and how they plan to apply this method after discharge. Patient discussed that her life is out of balance in "every way". She was particularly focused on not being in touch with her own emotions or with other people. Other group members provided patient with examples of how she is kind and caring. Patient was initially resistant to feedback but later became more receptive.   Tilden Fossa, MSW, [place] Clinical Social Worker [facility] [phone]

## 2015-02-19 NOTE — Progress Notes (Signed)
D:  Patient's self inventory sheet, patient has fair sleep, sleep medication is somewhat helpful.  Good appetite, low energy level, poor concentration.  Rated depression and hopeless 6, anxiety 7.  Denied withdrawals.  Denied SI.  Physical problems, BP up, legs weak and in pain.  Pain 7-9 in past 24 hours.  Legs, knee, head pressure pain between eyes.  Goal is to increase positive feeling about herself.  Try harder to control anxiety and panic.  Engage more with others.  Concerned about not being able to find a clean and affordable apartment. A:  Medications administered per MD orders.  Emotional support and encouragement given patient. R:  Patient denied SI and HI while talking to nurse this morning.  Denied hearing voices.  Stated she does see fleeing shadows, afraid of roaches, her house is infested with roaches.

## 2015-02-19 NOTE — Progress Notes (Signed)
Recreation Therapy Notes  Date: 02.06.2017 Time: 9:30am Location: [address] Group Room   Group Topic: Stress Management  Goal Area(s) Addresses:  Patient will actively participate in stress management techniques presented during session.   Behavioral Response: Appropriate   Intervention: Stress management techniques  Activity :  Deep Breathing and Progressive Body Scan. LRT provided instruction and demonstration on practice of Progressive Body Scan. Technique was coupled with deep breathing.   Education:  Stress Management, Discharge Planning.   Education Outcome: Acknowledges education  Clinical Observations/Feedback: Patient actively engaged in technique introduced, expressed no concerns and demonstrated ability to practice independently post d/c.   Laureen Ochs Diara Chaudhari, LRT/CTRS        Brittnie Lewey L 02/19/2015 3:22 PM

## 2015-02-19 NOTE — Progress Notes (Signed)
Patient ID: [name], female   DOB: [birth year], 65 y.o.   MRN: [id number] D: " I want to get well I don't want to die anymore". Pt reports still having anxiety. Patient reported a panic attack earlier today with relief from medication. Pt reports she is doing well now and sitting in dayroom with peers. Pt denies SI/HI/AVH. Cooperative with assessment.   A: Met with pt 1:1. Medications administered as prescribed. Support and encouragement provided. R: Patient remains safe and complaint with medications.

## 2015-02-19 NOTE — Progress Notes (Addendum)
Patient ID: [name], female   DOB: 1950/09/30, 65 y.o.   MRN: [id number] [facility] MD Progress Note  02/19/2015 3:07 PM [name]  MRN:  [id number] Subjective:   Patient reports partially improved mood, but states she still feels depressed, and reports apprehension about discharging , states " I feel safe here, but I think I might  end up killing myself if I went back to where I was living before ". States this place was dirty with " a lot of cockroaches". She continues to endorse depression but does state she feels the medication " is helping " and states " I do feel a little better ". Denies medication side effects. Initial reports of dry mouth and blurry vision have improved since Doxepin was stopped . Sleep has improved partially  with Ambien ,  and denies side effects.  Objective: I have discussed case with treatment team and have met with patient - as above, patient reports partial improvement of mood , but reports ongoing depression and apprehension about returning to prior living situation. Reports  Ongoing anxiety, but currently presents calm, pleasant, no psychomotor restlessness . She is currently tolerating medications well- denies medication side effects. Insomnia has been a chronic issue, currently partially improved on Ambien  5 mgrs QHS .  She is visible on unit, and has been going to some groups, has been noted to be more interactive with peers.      Principal Problem:  Major Depression , Recurrent, no Psychotic Features  Diagnosis:   Patient Active Problem List   Diagnosis Date Noted  . MDD (major depressive disorder), recurrent episode, severe ([place]) [F33.2] 02/13/2015  . Dysphagia [R13.10] 10/26/2014  . New onset of headaches after age 41 [R51] 09/19/2014  . Pulsatile tinnitus of left ear [id number]] 09/19/2014  . Blurry vision [H53.8] 09/19/2014  . Excessive daytime sleepiness [G47.19] 09/19/2014  . Morbid obesity ([place]) [E66.01] 09/19/2014  . Nocturnal headaches [R51]  09/19/2014  . Left-sided weakness [M62.89] 04/20/2014  . Tobacco use disorder [F17.200] 04/19/2014  . Blood in stool [K92.1] 01/30/2013  . Hematuria, gross [R31.0] 01/30/2013  . Intermittent claudication ([place]) [I73.9] 01/20/2013  . Eye mass [H57.8] 12/13/2012  . Cataract [H26.9] 12/13/2012  . Recurrent headache [R51] 12/13/2012  . Major depressive disorder, recurrent episode, severe ([place]) [F33.2] 10/29/2012  . Anxiety state, unspecified [F41.1] 09/20/2012  . Abnormality of gait [R26.9] 04/21/2012  . PAD (peripheral artery disease) ([place]) [I73.9] 04/15/2012  . Hyperlipidemia [E78.5] 04/15/2012  . Hypertension [I10] 04/15/2012  . Hypothyroidism [E03.9] 04/15/2012  . GERD (gastroesophageal reflux disease) [K21.9] 04/15/2012  . Routine health maintenance [Z00.00] 04/15/2012   Total Time spent with patient:  25 minutes     Past Medical History:  Past Medical History  Diagnosis Date  . Hypothyroidism   . Hypertension   . TIA (transient ischemic attack)   . PUD (peptic ulcer disease)   . HLD (hyperlipidemia)   . Depression   . GERD (gastroesophageal reflux disease)   . Diverticulosis   . PAD (peripheral artery disease) ([place])   . Anal fissure   . Anxiety   . Chronic headaches   . Cataract   . Glaucoma   . Allergy     Past Surgical History  Procedure Laterality Date  . Total thyroidectomy    . Aortogram    . Iliac artery angioplasty and stenting Left 12/2012  . Sfa stent Left 2012    at [facility]  . Abdominal aortagram N/A  01/20/2013    Procedure: ABDOMINAL Maxcine Ham;  Surgeon: Conrad Damascus, MD;  Location: [facility] CATH LAB;  Service: Cardiovascular;  Laterality: N/A;  . Anal fissure repair    . Cataract extraction Left   . Breast reduction surgery    . Stomach surgery     Family History:  Family History  Problem Relation Age of Onset  . Peripheral vascular disease Mother   . Heart disease Mother   . Hypertension Mother   . AAA (abdominal aortic  aneurysm) Father   . Hypertension Father   . Uterine cancer Sister     mets to brain  . Liver cancer Brother   . Stomach cancer Maternal Aunt     x2  . Stomach cancer Paternal Aunt   . Heart disease Brother   . Kidney cancer Brother   . Anuerysm Brother     brain  . HIV Brother     x2  . Aneurysm Father     Social History:  History  Alcohol Use No     History  Drug Use  . 1.00 per week  . Special: Cocaine, Marijuana    Comment: marijuana    Social History   Social History  . Marital Status: Widowed    Spouse Name: N/A  . Number of Children: 1  . Years of Education: 12   Occupational History  . Disabled     Social History Main Topics  . Smoking status: Current Every Day Smoker -- 0.50 packs/day for .5 years    Types: Cigarettes  . Smokeless tobacco: Never Used  . Alcohol Use: No  . Drug Use: 1.00 per week    Special: Cocaine, Marijuana     Comment: marijuana  . Sexual Activity: No   Other Topics Concern  . None   Social History Narrative   Live at home alone.   Caffeine use: Drinks coffee once/day   Soda- 2-3 times per day      Additional Social History:   Sleep:  Partially improved but still reported as sub- optimal with episodes of waking up   Appetite:  Good  Current Medications: Current Facility-Administered Medications  Medication Dose Route Frequency Provider Last Rate Last Dose  . acetaminophen (TYLENOL) tablet 500 mg  500 mg Oral Q6H PRN Jenne Campus, MD   500 mg at 02/19/15 1430  . alum & mag hydroxide-simeth (MAALOX/MYLANTA) 200-200-20 MG/5ML suspension 30 mL  30 mL Oral Q4H PRN Laverle Hobby, PA-C   30 mL at 02/14/15 1308  . amLODipine (NORVASC) tablet 5 mg  5 mg Oral QHS Jenne Campus, MD   5 mg at 02/18/15 2108  . ARIPiprazole (ABILIFY) tablet 2 mg  2 mg Oral Daily Jenne Campus, MD   2 mg at 02/19/15 0734  . clopidogrel (PLAVIX) tablet 75 mg  75 mg Oral Daily Laverle Hobby, PA-C   75 mg at 02/19/15 0732  .  cyclobenzaprine (FLEXERIL) tablet 10 mg  10 mg Oral Q8H PRN Laverle Hobby, PA-C   10 mg at 02/19/15 1003  . hydrOXYzine (ATARAX/VISTARIL) tablet 25 mg  25 mg Oral Q6H PRN Laverle Hobby, PA-C   25 mg at 02/19/15 1003  . levothyroxine (SYNTHROID, LEVOTHROID) tablet 112 mcg  112 mcg Oral QAC breakfast Laverle Hobby, PA-C   112 mcg at 02/19/15 0615  . lisinopril (PRINIVIL,ZESTRIL) tablet 40 mg  40 mg Oral Daily Laverle Hobby, PA-C   40 mg at 02/19/15 0732  .  magnesium hydroxide (MILK OF MAGNESIA) suspension 30 mL  30 mL Oral Daily PRN Laverle Hobby, PA-C   30 mL at 02/18/15 2111  . pantoprazole (PROTONIX) EC tablet 40 mg  40 mg Oral Daily Laverle Hobby, PA-C   40 mg at 02/19/15 0615  . sertraline (ZOLOFT) tablet 100 mg  100 mg Oral Daily Myer Peer , MD   100 mg at 02/19/15 0732  . zolpidem (AMBIEN) tablet 5 mg  5 mg Oral QHS Jenne Campus, MD   5 mg at 02/18/15 2108    Lab Results:  No results found for this or any previous visit (from the past 48 hour(s)).  Physical Findings: AIMS: Facial and Oral Movements Muscles of Facial Expression: None, normal Lips and Perioral Area: None, normal Jaw: None, normal Tongue: None, normal,Extremity Movements Upper (arms, wrists, hands, fingers): None, normal Lower (legs, knees, ankles, toes): None, normal, Trunk Movements Neck, shoulders, hips: None, normal, Overall Severity Severity of abnormal movements (highest score from questions above): None, normal Incapacitation due to abnormal movements: None, normal Patient's awareness of abnormal movements (rate only patient's report): No Awareness, Dental Status Current problems with teeth and/or dentures?: No Does patient usually wear dentures?: No  CIWA:  CIWA-Ar Total: 1 COWS:  COWS Total Score: 1  Musculoskeletal: Strength & Muscle Tone: within normal limits Gait & Station: normal Patient leans: N/A  Psychiatric Specialty Exam: Review of Systems  Psychiatric/Behavioral:  Positive for depression. Negative for suicidal ideas and hallucinations. The patient is nervous/anxious.   All other systems reviewed and are negative.   Blood pressure 123/71, pulse 79, temperature 97.8 F (36.6 C), temperature source Oral, resp. rate 18, height 5' 1" (1.549 m), weight 206 lb (93.441 kg).Body mass index is 38.94 kg/(m^2).  General Appearance: improved grooming   Eye Contact::  Good  Speech:  Normal Rate  Volume:  Normal  Mood: mood partially improved compared to admission, less depressed   Affect;  More reactive   Thought Process:  Linear  Thought Content : Ruminates about  Living situation and disposition planning , no hallucinations, no delusions     Suicidal Thoughts:   Denies suicidal or self injurious ideations at this time , but makes statement she feels she could become suicidal if returning to previous living environment after discharge  Homicidal Thoughts:  No  Memory:  recent and remote grossly intact   Judgement:  Other:  improved   Insight:  Improving   Psychomotor Activity:  Normal   Concentration:  Good  Recall:  Good  Fund of Knowledge:Good  Language: Good  Akathisia:  Negative  Handed:  Right  AIMS (if indicated):     Assets:  Desire for Improvement Financial Resources/Insurance Housing Resilience Social Support  ADL's:   Improving   Cognition: WNL  Sleep:  Number of Hours: 6.25   Assessment - patient presents with partial improvement of mood and affect. Still reports some depression, anxiety, and in particular, is ruminative about disposition planning and reluctant to return to prior living situation , which she states would exacerbate her depression and cause re-emergence of suicidal ideations . She reports partially improved sleep on Ambien, although still reports some residual insomnia. Chart notes indicate she slept 6.25 hours . She is not endorsing side effects from Ambien , Zoloft , or Abilify trials at this time .   Treatment Plan  Summary: Daily contact with patient to assess and evaluate symptoms and progress in treatment, Medication management, Plan inpatient treatment  and  medications as below  Continue to encourage group, milieu participation to work on coping skills and symptom reduction Continue to encourage and support smoking cessation efforts - patient states she currently feels unready to stop smoking- has history of PAD. Increase Zoloft  To 150  mgrs QDAY for depression Continue Abilify 2 mgrs  QDAY as an adjunct  for management of depression- side effects and rationale reviewed Continue Ambien 5 mgrs QHS PRN for insomnia as needed  Continue Plavix for history of vascular disease   Neita Garnet, MD 02/19/2015, 3:07 PM   Berniece Andreas, MD

## 2015-02-19 NOTE — BHH Group Notes (Signed)
[facility] LCSW Aftercare Discharge Planning Group Note  02/19/2015 8:45 AM  Participation Quality: Alert, Appropriate and Oriented  Mood/Affect: Flat  Depression Rating: 7  Anxiety Rating: 8  Thoughts of Suicide: Pt endorses thoughts of dying but denies SI  Will you contract for safety? Yes  Current AVH: Pt denies  Plan for Discharge/Comments: Pt attended discharge planning group and actively participated in group. CSW discussed suicide prevention education with the group and encouraged them to discuss discharge planning and any relevant barriers. Pt reports that she feels she is making progress. She continues to express that she is not sleeping well and feels that her medications are being changed to frequently.  Transportation Means: Pt reports access to transportation  Supports: No supports mentioned at this time  Peri Maris, [place] 02/19/2015 9:27 AM

## 2015-02-19 NOTE — Plan of Care (Signed)
Problem: Alteration in mood Goal: STG-Patient is able to discuss feelings and issues (Patient is able to discuss feelings and issues leading to depression)  Outcome: Progressing Pt reports need to be safe and get better. Pt denies SI.

## 2015-02-19 NOTE — Progress Notes (Signed)
Adult Psychoeducational Group Note  Date:  02/19/2015 Time:  8:38 PM  Group Topic/Focus:  Wrap-Up Group:   The focus of this group is to help patients review their daily goal of treatment and discuss progress on daily workbooks.  Participation Level:  Active  Participation Quality:  Appropriate  Affect:  Appropriate  Cognitive:  Appropriate  Insight: Appropriate  Engagement in Group:  Engaged  Modes of Intervention:  Discussion  Additional Comments: Pt was pleasant during wrap-up group. Pt rated her overall day a 7 out of 10 because she attended all groups and was having positive communication with staff and patients on the unit all day. Pt reported that she achieved her goal for the day, which was to be more sociable.   Lincoln Brigham 02/19/2015, 8:58 PM

## 2015-02-19 NOTE — Plan of Care (Signed)
Problem: Consults Goal: Anxiety Disorder Patient Education See Patient Education Module for eduction specifics.  Outcome: Progressing Nurse discussed anxiety/depression/coping skills with patient.

## 2015-02-20 NOTE — Progress Notes (Signed)
D: Patient continues to c/o of poor sleep.  She states she wake up between 2 and 3 and can't go back to sleep.  She would like her ambien increased and thought the doctor was going to do this.  Informed her I would check with MD regarding same.  She report decreased depressive symptoms.  She rates her depression and hopelessness as a 4; anxiety as a 6.  Patient is attending groups and participating. A: Continue to monitor medication management and MD orders.  Safety checks completed every 15 minutes per protocol.  Offer support and encouragement as needed. R: Patient is receptive to staff; her behavior is appropriate.

## 2015-02-20 NOTE — Progress Notes (Addendum)
Patient ID: [name], female   DOB: July 09, 1950, 65 y.o.   MRN: [id number] [facility] MD Progress Note  02/20/2015 12:32 PM [name]  MRN:  [id number] Subjective:   Patient reports some ongoing improvement, particularly as she has found out she can move in with another family member for a period of time " until I can move out on my own". Denies medication side effects. Continues to report fair sleep, but does state she is sleeping better on Ambien.   Objective: I have discussed case with treatment team and have met with patient. Patient presents less depressed, with a fuller range of affect, and more future oriented and optimistic. This is is partially due to finding out she can stay with another family member after discharge. Housing issues had been a significant stressor, and patient Had reported feeling unsafe about returning to prior living situation. As above, medications currently well tolerated . She is visible in day room and going to groups. Behavior in good control. Less anxious, less apprehensive about discussing discharge options and focusing on discharge planning today.  Insomnia is improved partially with Ambien .     Principal Problem:  Major Depression , Recurrent, no Psychotic Features  Diagnosis:   Patient Active Problem List   Diagnosis Date Noted  . MDD (major depressive disorder), recurrent episode, severe ([place]) [F33.2] 02/13/2015  . Dysphagia [R13.10] 10/26/2014  . New onset of headaches after age 32 [R51] 09/19/2014  . Pulsatile tinnitus of left ear [id number]] 09/19/2014  . Blurry vision [H53.8] 09/19/2014  . Excessive daytime sleepiness [G47.19] 09/19/2014  . Morbid obesity ([place]) [E66.01] 09/19/2014  . Nocturnal headaches [R51] 09/19/2014  . Left-sided weakness [M62.89] 04/20/2014  . Tobacco use disorder [F17.200] 04/19/2014  . Blood in stool [K92.1] 01/30/2013  . Hematuria, gross [R31.0] 01/30/2013  . Intermittent claudication ([place]) [I73.9] 01/20/2013   . Eye mass [H57.8] 12/13/2012  . Cataract [H26.9] 12/13/2012  . Recurrent headache [R51] 12/13/2012  . Major depressive disorder, recurrent episode, severe ([place]) [F33.2] 10/29/2012  . Anxiety state, unspecified [F41.1] 09/20/2012  . Abnormality of gait [R26.9] 04/21/2012  . PAD (peripheral artery disease) ([place]) [I73.9] 04/15/2012  . Hyperlipidemia [E78.5] 04/15/2012  . Hypertension [I10] 04/15/2012  . Hypothyroidism [E03.9] 04/15/2012  . GERD (gastroesophageal reflux disease) [K21.9] 04/15/2012  . Routine health maintenance [Z00.00] 04/15/2012   Total Time spent with patient:  20 minutes     Past Medical History:  Past Medical History  Diagnosis Date  . Hypothyroidism   . Hypertension   . TIA (transient ischemic attack)   . PUD (peptic ulcer disease)   . HLD (hyperlipidemia)   . Depression   . GERD (gastroesophageal reflux disease)   . Diverticulosis   . PAD (peripheral artery disease) ([place])   . Anal fissure   . Anxiety   . Chronic headaches   . Cataract   . Glaucoma   . Allergy     Past Surgical History  Procedure Laterality Date  . Total thyroidectomy    . Aortogram    . Iliac artery angioplasty and stenting Left 12/2012  . Sfa stent Left 2012    at [facility]  . Abdominal aortagram N/A 01/20/2013    Procedure: ABDOMINAL Maxcine Ham;  Surgeon: Conrad Argusville, MD;  Location: [facility] CATH LAB;  Service: Cardiovascular;  Laterality: N/A;  . Anal fissure repair    . Cataract extraction Left   . Breast reduction surgery    . Stomach surgery  Family History:  Family History  Problem Relation Age of Onset  . Peripheral vascular disease Mother   . Heart disease Mother   . Hypertension Mother   . AAA (abdominal aortic aneurysm) Father   . Hypertension Father   . Uterine cancer Sister     mets to brain  . Liver cancer Brother   . Stomach cancer Maternal Aunt     x2  . Stomach cancer Paternal Aunt   . Heart disease Brother   . Kidney cancer  Brother   . Anuerysm Brother     brain  . HIV Brother     x2  . Aneurysm Father     Social History:  History  Alcohol Use No     History  Drug Use  . 1.00 per week  . Special: Cocaine, Marijuana    Comment: marijuana    Social History   Social History  . Marital Status: Widowed    Spouse Name: N/A  . Number of Children: 1  . Years of Education: 12   Occupational History  . Disabled     Social History Main Topics  . Smoking status: Current Every Day Smoker -- 0.50 packs/day for .5 years    Types: Cigarettes  . Smokeless tobacco: Never Used  . Alcohol Use: No  . Drug Use: 1.00 per week    Special: Cocaine, Marijuana     Comment: marijuana  . Sexual Activity: No   Other Topics Concern  . None   Social History Narrative   Live at home alone.   Caffeine use: Drinks coffee once/day   Soda- 2-3 times per day      Additional Social History:   Sleep:  Partially partially improved   Appetite:  Good  Current Medications: Current Facility-Administered Medications  Medication Dose Route Frequency Provider Last Rate Last Dose  . acetaminophen (TYLENOL) tablet 1,000 mg  1,000 mg Oral Q8H PRN Jenne Campus, MD   1,000 mg at 02/20/15 0743  . alum & mag hydroxide-simeth (MAALOX/MYLANTA) 200-200-20 MG/5ML suspension 30 mL  30 mL Oral Q4H PRN Laverle Hobby, PA-C   30 mL at 02/14/15 1308  . amLODipine (NORVASC) tablet 5 mg  5 mg Oral QHS Jenne Campus, MD   5 mg at 02/19/15 2113  . ARIPiprazole (ABILIFY) tablet 2 mg  2 mg Oral Daily Jenne Campus, MD   2 mg at 02/20/15 0739  . clopidogrel (PLAVIX) tablet 75 mg  75 mg Oral Daily Laverle Hobby, PA-C   75 mg at 02/20/15 0739  . cyclobenzaprine (FLEXERIL) tablet 10 mg  10 mg Oral Q8H PRN Laverle Hobby, PA-C   10 mg at 02/19/15 2113  . hydrOXYzine (ATARAX/VISTARIL) tablet 25 mg  25 mg Oral Q6H PRN Laverle Hobby, PA-C   25 mg at 02/20/15 0209  . levothyroxine (SYNTHROID, LEVOTHROID) tablet 112 mcg  112 mcg Oral  QAC breakfast Laverle Hobby, PA-C   112 mcg at 02/20/15 4709  . lisinopril (PRINIVIL,ZESTRIL) tablet 40 mg  40 mg Oral Daily Laverle Hobby, PA-C   40 mg at 02/20/15 0740  . magnesium hydroxide (MILK OF MAGNESIA) suspension 30 mL  30 mL Oral Daily PRN Laverle Hobby, PA-C   30 mL at 02/18/15 2111  . nicotine polacrilex (NICORETTE) gum 2 mg  2 mg Oral PRN Jenne Campus, MD      . pantoprazole (PROTONIX) EC tablet 40 mg  40 mg Oral Daily Frederico Hamman E  Simon, PA-C   40 mg at 02/20/15 8546  . sertraline (ZOLOFT) tablet 150 mg  150 mg Oral Daily Jenne Campus, MD   150 mg at 02/20/15 0739  . zolpidem (AMBIEN) tablet 5 mg  5 mg Oral QHS Jenne Campus, MD   5 mg at 02/19/15 2113    Lab Results:  No results found for this or any previous visit (from the past 48 hour(s)).  Physical Findings: AIMS: Facial and Oral Movements Muscles of Facial Expression: None, normal Lips and Perioral Area: None, normal Jaw: None, normal Tongue: None, normal,Extremity Movements Upper (arms, wrists, hands, fingers): None, normal Lower (legs, knees, ankles, toes): None, normal, Trunk Movements Neck, shoulders, hips: None, normal, Overall Severity Severity of abnormal movements (highest score from questions above): None, normal Incapacitation due to abnormal movements: None, normal Patient's awareness of abnormal movements (rate only patient's report): No Awareness, Dental Status Current problems with teeth and/or dentures?: No Does patient usually wear dentures?: No  CIWA:  CIWA-Ar Total: 1 COWS:  COWS Total Score: 1  Musculoskeletal: Strength & Muscle Tone: within normal limits Gait & Station: normal Patient leans: N/A  Psychiatric Specialty Exam: Review of Systems  Psychiatric/Behavioral: Positive for depression. Negative for suicidal ideas and hallucinations. The patient is nervous/anxious.   All other systems reviewed and are negative.   Blood pressure 126/79, pulse 96, temperature 98.4 F  (36.9 C), temperature source Oral, resp. rate 16, height 5' 1"  (1.549 m), weight 206 lb (93.441 kg).Body mass index is 38.94 kg/(m^2).  General Appearance: improved grooming   Eye Contact::  Good  Speech:  Normal Rate  Volume:  Normal  Mood:  Gradually improving   Affect;  Less depressed, less anxious   Thought Process:  Linear  Thought Content : less ruminative, no hallucinations, no delusions     Suicidal Thoughts:   States she still feels she has become a " burden " to family, but no longer feeling suicidal , denies any SI at this time  Homicidal Thoughts:  No  Memory:  recent and remote grossly intact   Judgement:  Other:  improved   Insight:  Improving   Psychomotor Activity:  Normal   Concentration:  Good  Recall:  Good  Fund of Knowledge:Good  Language: Good  Akathisia:  Negative  Handed:  Right  AIMS (if indicated):     Assets:  Desire for Improvement Financial Resources/Insurance Housing Resilience Social Support  ADL's:   Improving   Cognition: WNL  Sleep:  Number of Hours: 6.5   Assessment - patient presents with partial but ongoing improvement and today presents less anxious and with fuller range of affect, more future oriented and optimistic. Found out she can stay with another family member after discharge- housing issues had been a major stressor. She is tolerating medications well at this time- has tolerated Zoloft titration and Abilify augmentation well thus far. Side effects have been reviewed.   Treatment Plan Summary: Daily contact with patient to assess and evaluate symptoms and progress in treatment, Medication management, Plan inpatient treatment  and medications as below  Continue to encourage group, milieu participation to work on coping skills and symptom reduction Continue to encourage and support smoking cessation efforts - patient states she currently feels unready to stop smoking- has history of PAD. Continue Zoloft   150  mgrs QDAY for  depression Continue Abilify 2 mgrs  QDAY as an adjunct  for management of depression- side effects and rationale reviewed Continue Ambien 5  mgrs QHS PRN for insomnia as needed - we discussed possible risks associated with further titration, and patient agrees to continue current dose. Continue Plavix for history of vascular disease   COBOS, Felicita Gage, MD 02/20/2015, 12:32 PM

## 2015-02-20 NOTE — BHH Group Notes (Signed)
[place] LCSW Group Therapy  02/20/2015   1:15 PM   Type of Therapy:  Group Therapy  Participation Level:  Active  Participation Quality:  Attentive, Sharing and Supportive  Affect: Appropriate  Cognitive:  Alert and Oriented  Insight:  Developing/Improving and Engaged  Engagement in Therapy:  Developing/Improving and Engaged  Modes of Intervention:  Clarification, Confrontation, Discussion, Education, Exploration, Limit-setting, Orientation, Problem-solving, Rapport Building, Art therapist, Socialization and Support  Summary of Progress/Problems: The topic for group therapy was feelings about diagnosis.  Pt actively participated in group discussion on their past and current diagnosis and how they feel towards this.  Pt also identified how society and family members judge them, based on their diagnosis as well as stereotypes and stigmas.  Patient engaged in mindfulness exercises.   Tilden Fossa, MSW, [place] Clinical Social Worker [facility] [phone]

## 2015-02-20 NOTE — BHH Group Notes (Signed)
[place] Group Notes:  (Nursing/MHT/Case Management/Adjunct)  Date:  02/20/2015  Time:  0900 am   Type of Therapy:  Psychoeducational Skills  Participation Level:  Active  Participation Quality:  Appropriate and Attentive  Affect:  Appropriate  Cognitive:  Alert and Appropriate  Insight:  Improving  Engagement in Group:  Supportive  Modes of Intervention:  Discussion  Summary of Progress/Problems: Discussed ruled of the unit since patient was asking questions regarding same.  Zipporah Plants 02/20/2015, 9:27 AM

## 2015-02-20 NOTE — Progress Notes (Signed)
Adult Psychoeducational Group Note  Date:  02/20/2015 Time:  8:30 PM  Group Topic/Focus:  Wrap-Up Group:   The focus of this group is to help patients review their daily goal of treatment and discuss progress on daily workbooks.  Participation Level:  Active  Participation Quality:  Appropriate  Affect:  Appropriate  Cognitive:  Appropriate  Insight: Appropriate  Engagement in Group:  Engaged  Modes of Intervention:  Discussion  Additional Comments:  Pt was pleasant during wrap-up group. Pt rated her overall day a 10 out of 10 and stated that her day was great and many things contributed to her day going so well. Pt reported that she achieved her goal for the day, which was to talk to the doctor about her discharge plans.   Lincoln Brigham 02/20/2015, 9:17 PM

## 2015-02-20 NOTE — Progress Notes (Signed)
Recreation Therapy Notes  Animal-Assisted Activity (AAA) Program Checklist/Progress Notes Patient Eligibility Criteria Checklist & Daily Group note for Rec Tx Intervention  Date: 02.07.2017 Time: 2:45pm Location: [address]    AAA/T Program Assumption of Risk Form signed by Patient/ or Parent Legal Guardian yes  Patient is free of allergies or sever asthma yes  Patient reports no fear of animals yes  Patient reports no history of cruelty to animals yes  Patient understands his/her participation is voluntary yes  Patient washes hands before animal contact yes  Patient washes hands after animal contact yes  Behavioral Response: Appropriate  Education: Hand Washing, Appropriate Animal Interaction   Education Outcome: Acknowledges education.   Clinical Observations/Feedback: Patient attended session, petting therapy dog and interacting with peers in group appropriately.   Laureen Ochs Yamna Mackel, LRT/CTRS        Aaryav Hopfensperger L 02/20/2015 3:11 PM

## 2015-02-20 NOTE — Progress Notes (Signed)
Patient ID: [name], female   DOB: August 13, 1950, 65 y.o.   MRN: [id number] D: Pt reports feeling the same as the day before. Pt reported plans to discharge home and get back with her life. Pt reports tolerating medication well. Pt denies SI/HI/AVH. Cooperative with assessment. A: Met with pt 1:1. Medications administered as prescribed. Support and encouragement provided. R: Patient remains safe and complaint with medications.

## 2015-02-21 MED ORDER — ACETAMINOPHEN 500 MG PO TABS: 1000.0000 mg | ORAL_TABLET | Freq: Four times a day (QID) | ORAL | Status: DC | PRN

## 2015-02-21 MED ORDER — ZOLPIDEM TARTRATE 5 MG PO TABS: 5.0000 mg | ORAL_TABLET | Freq: Every day | ORAL | Status: DC

## 2015-02-21 MED ORDER — ARIPIPRAZOLE 2 MG PO TABS: 2.0000 mg | ORAL_TABLET | Freq: Every day | ORAL | Status: DC

## 2015-02-21 MED ORDER — HYDROXYZINE HCL 25 MG PO TABS: 25.0000 mg | ORAL_TABLET | Freq: Four times a day (QID) | ORAL | Status: DC | PRN

## 2015-02-21 MED ORDER — SERTRALINE HCL 50 MG PO TABS: 150.0000 mg | ORAL_TABLET | Freq: Every day | ORAL | Status: DC

## 2015-02-21 MED ORDER — TRAZODONE HCL 100 MG PO TABS
100.0000 mg | ORAL_TABLET | Freq: Every day | ORAL | Status: DC
  Filled 2015-02-21 (×2): qty 1

## 2015-02-21 MED ORDER — CLOPIDOGREL BISULFATE 75 MG PO TABS: 75.0000 mg | ORAL_TABLET | Freq: Every day | ORAL | Status: DC

## 2015-02-21 MED ORDER — CYCLOBENZAPRINE HCL 10 MG PO TABS: 10.0000 mg | ORAL_TABLET | Freq: Three times a day (TID) | ORAL | Status: DC | PRN

## 2015-02-21 MED ORDER — AMLODIPINE BESYLATE 5 MG PO TABS: 5.0000 mg | ORAL_TABLET | Freq: Every day | ORAL | Status: DC

## 2015-02-21 MED ORDER — LEVOTHYROXINE SODIUM 112 MCG PO TABS: 112.0000 ug | ORAL_TABLET | Freq: Every day | ORAL | Status: DC

## 2015-02-21 MED ORDER — NICOTINE POLACRILEX 2 MG MT GUM: 2.0000 mg | CHEWING_GUM | OROMUCOSAL | Status: DC | PRN

## 2015-02-21 MED ORDER — LISINOPRIL 40 MG PO TABS: 40.0000 mg | ORAL_TABLET | Freq: Every day | ORAL | Status: DC

## 2015-02-21 MED ORDER — TRAZODONE HCL 100 MG PO TABS: 100.0000 mg | ORAL_TABLET | Freq: Every day | ORAL | Status: DC

## 2015-02-21 MED ORDER — PANTOPRAZOLE SODIUM 40 MG PO TBEC: 40.0000 mg | DELAYED_RELEASE_TABLET | Freq: Every day | ORAL | Status: DC

## 2015-02-21 NOTE — Progress Notes (Signed)
Discharge Note:  Patient discharged with family member.  Patient denied SI and HI.  Denied A/V hallucinations.  Suicide prevention information given and discussed with patient who stated she understood and had no questions.  Patient stated she received all her belongings, sweater, scarf, pocketbook, keys, jewelry, cell phone, toiletries, misc items, clothing, etc.  Patient stated she appreciated all assistance received from [facility] staff.  All discharge information given to patient at discharge required by [facility].

## 2015-02-21 NOTE — Progress Notes (Signed)
D:  Patient's self inventory sheet, patient sleeps good, sleep medication is not helpful.  Only slept for 2 hours then woke up.  Fair appetite, normal energy level, good concentration.  Denied depression and hopeless and rated anxiety 3.  Denied withdrawals. Denied SI.  Denied physical problems.  Does have headaches, worst pain in past 24 hours is #4, legs hurt.  Goal is to remain positive and keep goals and continue moving forward with her life.  Stated she is being released with valuable skill.  Thank you for your support, your caring about me and my problems.  Thank you for saving my life.   Does have discharge plans. A:  Medications administered per MD orders.  Emotional support and encouragement given patient. R:  Denied SI and HI, contracts for safety.   Denied A/V hallucinations.  Safety maintained with 15 minuet checks.

## 2015-02-21 NOTE — BHH Group Notes (Signed)
   [facility] LCSW Aftercare Discharge Planning Group Note  02/21/2015  8:45 AM   Participation Quality: Alert, Appropriate and Oriented  Mood/Affect: Appropriate  Depression Rating: 3  Anxiety Rating: 5  Thoughts of Suicide: Pt denies SI/HI  Will you contract for safety? Yes  Current AVH: Pt denies  Plan for Discharge/Comments: Pt attended discharge planning group and actively participated in group. CSW provided pt with today's workbook. Patient reports feeling "great" today and plans to stay with her sister-in-law temporarily and follow up with outpatient services.   Transportation Means: Pt reports access to transportation  Supports: No supports mentioned at this time  Tilden Fossa, MSW, [employer] Clinical Social Worker [employer] [phone]

## 2015-02-21 NOTE — Progress Notes (Signed)
  [facility] Adult Case Management Discharge Plan :  Will you be returning to the same living situation after discharge:  No. Patient plans to stay with sister-in-law At discharge, do you have transportation home?: Yes,  patient will have family transport Do you have the ability to pay for your medications: Yes,  patient will be provided with prescriptions at discharge  Release of information consent forms completed and in the chart;  Patient's signature needed at discharge.  Patient to Follow up at: Follow-up Information    Follow up with [facility] On 02/26/2015.   Why:  Hospital follow up appt for medication management on Monday Feb. 13th at 2:15pm with Dr. Heber Henrietta (Dr. Redmond Pulling not available). Call office if you need to reschedule.    Contact information:   [address] [place], [place] [address] [phone]       Follow up with [facility].   Why:  Therapy appt with Richardo Priest, LCSW on Monday February 13th at 11am. Please arrive ten minutes early and bring your insurance card. Call office by Friday morning if you need to reschedule appt.    Contact information:   [address] [place], [place] [address] [phone]      Next level of care provider has access to [facility]:Yes  Safety Planning and Suicide Prevention discussed: Yes,  with patient and daughter  Have you used any form of tobacco in the last 30 days? (Cigarettes, Smokeless Tobacco, Cigars, and/or Pipes): Yes  Has patient been referred to the Quitline?: Patient refused referral  Patient has been referred for addiction treatment: N/A  Joelee Snoke, Casimiro Needle 02/21/2015, 10:50 AM

## 2015-02-21 NOTE — Tx Team (Signed)
Interdisciplinary Treatment Plan Update (Adult) Date: 02/21/15    Time Reviewed: 9:30 AM  Progress in Treatment: Attending groups: Yes Participating in groups: Yes Taking medication as prescribed: Yes Tolerating medication: Yes Family/Significant other contact made: Yes, CSW has spoken with daughter Patient understands diagnosis: Yes Discussing patient identified problems/goals with staff: Yes Medical problems stabilized or resolved: Yes Denies suicidal/homicidal ideation: Yes Issues/concerns per patient self-inventory: Yes Other:  New problem(s) identified: N/A  Discharge Plan or Barriers: Patient will stay with her sister-in-law to follow up with outpatient services.   Reason for Continuation of Hospitalization:  Depression Anxiety Medication Stabilization   Comments: N/A  Estimated length of stay: Discharge anticipated for today 02/21/15    Patient is a 65 year old female admitted for depression and anxiety. Pt reports primary trigger(s) for admission are housing situation and grief. Patient will benefit from crisis stabilization, medication evaluation, group therapy and psycho education in addition to case management for discharge planning. At discharge, it is recommended that Pt remain compliant with established discharge plan and continued treatment.   Review of initial/current patient goals per problem list:  1. Goal(s): Patient will participate in aftercare plan   Met: Yes   Target date: 3-5 days post admission date   As evidenced by: Patient will participate within aftercare plan AEB aftercare provider and housing plan at discharge being identified.  1/31: Goal met. Patient plans to return home to follow up with outpatient services.   2/8: Goal met. Patient plans to stay with sister-in-law to follow up with outpatient services.   2. Goal (s): Patient will exhibit decreased depressive symptoms and suicidal ideations.   Met: Yes   Target date: 3-5 days  post admission date   As evidenced by: Patient will utilize self rating of depression at 3 or below and demonstrate decreased signs of depression or be deemed stable for discharge by MD.  1/31: Goal not met: Pt presents with flat affect and depressed mood.  Pt admitted with depression rating of 10.  Pt to show decreased sign of depression and a rating of 3 or less before d/c.    2/3: Goal progressing. Patient rates depression at 7, denies SI.  2/8: Goal met. Patient rates depression 3, denies SI.     3. Goal(s): Patient will demonstrate decreased signs and symptoms of anxiety.   Met: Adequate for discharge per MD   Target date: 3-5 days post admission date   As evidenced by: Patient will utilize self rating of anxiety at 3 or below and demonstrated decreased signs of anxiety, or be deemed stable for discharge by MD  1/31: Goal not met: Pt presents with anxious mood and affect.  Pt admitted with anxiety rating of 10.  Pt to show decreased sign of anxiety and a rating of 3 or less before d/c.  2/3: Goal progressing. Patient rates anxiety high related to not sleeping well.  2/8: Adequate for discharge per MD. Patient rates anxiety at 5 but reports feeling safe for discharge.   Attendees: Patient:    Family:    Physician: Dr. Parke Poisson; Dr. Sabra Heck 02/21/2015 9:30 AM  Nursing: Loletta Specter, [address] Christa Kirtland Bouchard [place], [place] 02/21/2015 9:30 AM  Clinical Social Worker: Tilden Fossa, LCSW 02/21/2015 9:30 AM  Other: Peri Maris, LCSWA; [place], LCSW  02/21/2015 9:30 AM  Other:  02/21/2015 9:30 AM  Other: Lars Pinks, Case Manager 02/21/2015 9:30 AM  Other: Agustina Caroli, May Augustin, NP 02/21/2015 9:30 AM  Scribe for Treatment Team:  Shantaya Bluestone, LCSW [phone]     

## 2015-02-21 NOTE — Discharge Summary (Signed)
Physician Discharge Summary Note  Patient:  [name] is an 65 y.o., female MRN:  [id number] DOB:  1950/12/18 Patient phone:  [phone] (home)  Patient address:   [address] [place] [address],  Total Time spent with patient: Greater than 30 minutes  Date of Admission:  02/13/2015  Date of Discharge: 02-21-15  Reason for Admission: Worsening symptoms of depression/suicidal ideations  Principal Problem: MDD (major depressive disorder), recurrent episode, severe [facility])  Discharge Diagnoses: Patient Active Problem List   Diagnosis Date Noted  . Severe episode of recurrent major depressive disorder, without psychotic features ([place]) [F33.2]   . MDD (major depressive disorder), recurrent episode, severe ([place]) [F33.2] 02/13/2015  . Dysphagia [R13.10] 10/26/2014  . New onset of headaches after age 56 [R51] 09/19/2014  . Pulsatile tinnitus of left ear P161950.A2] 09/19/2014  . Blurry vision [H53.8] 09/19/2014  . Excessive daytime sleepiness [G47.19] 09/19/2014  . Morbid obesity ([place]) [E66.01] 09/19/2014  . Nocturnal headaches [R51] 09/19/2014  . Left-sided weakness [M62.89] 04/20/2014  . Tobacco use disorder [F17.200] 04/19/2014  . Blood in stool [K92.1] 01/30/2013  . Hematuria, gross [R31.0] 01/30/2013  . Intermittent claudication ([place]) [I73.9] 01/20/2013  . Eye mass [H57.8] 12/13/2012  . Cataract [H26.9] 12/13/2012  . Recurrent headache [R51] 12/13/2012  . Major depressive disorder, recurrent episode, severe ([place]) [F33.2] 10/29/2012  . Anxiety state, unspecified [F41.1] 09/20/2012  . Abnormality of gait [R26.9] 04/21/2012  . PAD (peripheral artery disease) ([place]) [I73.9] 04/15/2012  . Hyperlipidemia [E78.5] 04/15/2012  . Hypertension [I10] 04/15/2012  . Hypothyroidism [E03.9] 04/15/2012  . GERD (gastroesophageal reflux disease) [K21.9] 04/15/2012  . Routine health maintenance [Z00.00] 04/15/2012   Past Psychiatric History: Major depressive disorder, recurrent  episodes  Past Medical History:  Past Medical History  Diagnosis Date  . Hypothyroidism   . Hypertension   . TIA (transient ischemic attack)   . PUD (peptic ulcer disease)   . HLD (hyperlipidemia)   . Depression   . GERD (gastroesophageal reflux disease)   . Diverticulosis   . PAD (peripheral artery disease) ([place])   . Anal fissure   . Anxiety   . Chronic headaches   . Cataract   . Glaucoma   . Allergy     Past Surgical History  Procedure Laterality Date  . Total thyroidectomy    . Aortogram    . Iliac artery angioplasty and stenting Left 12/2012  . Sfa stent Left 2012    at [facility]  . Abdominal aortagram N/A 01/20/2013    Procedure: ABDOMINAL Maxcine Ham;  Surgeon: Conrad Union Star, MD;  Location: [facility] CATH LAB;  Service: Cardiovascular;  Laterality: N/A;  . Anal fissure repair    . Cataract extraction Left   . Breast reduction surgery    . Stomach surgery     Family History:  Family History  Problem Relation Age of Onset  . Peripheral vascular disease Mother   . Heart disease Mother   . Hypertension Mother   . AAA (abdominal aortic aneurysm) Father   . Hypertension Father   . Uterine cancer Sister     mets to brain  . Liver cancer Brother   . Stomach cancer Maternal Aunt     x2  . Stomach cancer Paternal Aunt   . Heart disease Brother   . Kidney cancer Brother   . Anuerysm Brother     brain  . HIV Brother     x2  . Aneurysm Father    Family Psychiatric  History: See H&P  Social History:  History  Alcohol Use No     History  Drug Use  . 1.00 per week  . Special: Cocaine, Marijuana    Comment: marijuana    Social History   Social History  . Marital Status: Widowed    Spouse Name: N/A  . Number of Children: 1  . Years of Education: 12   Occupational History  . Disabled     Social History Main Topics  . Smoking status: Current Every Day Smoker -- 0.50 packs/day for .5 years    Types: Cigarettes  . Smokeless tobacco: Never  Used  . Alcohol Use: No  . Drug Use: 1.00 per week    Special: Cocaine, Marijuana     Comment: marijuana  . Sexual Activity: No   Other Topics Concern  . None   Social History Narrative   Live at home alone.   Caffeine use: Drinks coffee once/day   Soda- 2-3 times per day      Hospital Course: 65 year old female. States she has history of depression, which has been getting worse over recent months, particularly after her husband passed away in 06/22/14. States she has had passive thoughts of being better off dead, and " wanting to die". She has had recent thoughts of walking into traffic. She states she had been on psychiatric medications - Zoloft & Doxepin- but had stopped taking them after the death of her husband. In addition to depression, reports history of panic attacks and agoraphobia.  [name] was admitted to th [facility] adult unit with complaints of worsening symptoms of depression since the death of husband in 06/22/14. She stated she had stopped all psychiatric medications since 22-Jun-2022 of last year as well. She was in need of mood stabilization treatments. During the course of her treatment, she was medicated & discharged on, Sertraline 50 mg for depression, Hydroxyzine 25 mg for anxiety, Abilify 2 mg for mood control, also as an adjunct treatment for depression & Trazodone 100 mg insomnia. She was enrolled & participated in the group counseling sessions being offered & held on this unit. She was counseled & learned coping skills that should help her cope better & maintain mood stability after discharge. She was resumed on all her pertinent home medications for her other previously existing medical issues that she presented. She tolerated her treatment regimen without any adverse effects reported. While her treatment was on going, [name]'s improvement was monitored by observation & her daily reports of symptom reduction noted.  Her emotional & mental status were monitored by daily  self-inventory reports completed by her & the clinical staff.          [name] was evaluated daily by the treatment team for mood stability & the need for continued recovery after discharge. Her motivation was an integral factor in her recovery & mood stability. She was offered further treatment options upon discharge & will follow up with the outpatient psychiatric services as listed below. She is instructed & aware to follow-up with her primary care provider for all her medical care needs.    Upon discharge, [name] was both mentally & medically stable for discharge. She is currently denying suicidal, homicidal ideation, auditory, visual/tactile hallucinations, delusional thoughts & or paranoia. She was provided with prescriptions for all her [facility] discharge medications. [name] left [facility] with all personal belongings in no apparent distress. Transportation per family.       Physical Findings: AIMS: Facial and Oral  Movements Muscles of Facial Expression: None, normal Lips and Perioral Area: None, normal Jaw: None, normal Tongue: None, normal,Extremity Movements Upper (arms, wrists, hands, fingers): None, normal Lower (legs, knees, ankles, toes): None, normal, Trunk Movements Neck, shoulders, hips: None, normal, Overall Severity Severity of abnormal movements (highest score from questions above): None, normal Incapacitation due to abnormal movements: None, normal Patient's awareness of abnormal movements (rate only patient's report): No Awareness, Dental Status Current problems with teeth and/or dentures?: No Does patient usually wear dentures?: No  CIWA:  CIWA-Ar Total: 1 COWS:  COWS Total Score: 2  Musculoskeletal: Strength & Muscle Tone: within normal limits Gait & Station: normal Patient leans: N/A  Psychiatric Specialty Exam: Review of Systems  Constitutional: Negative.   HENT: Negative.   Eyes: Negative.   Respiratory: Negative.   Cardiovascular: Negative.   Gastrointestinal:  Negative.   Genitourinary: Negative.   Musculoskeletal: Negative.   Skin: Negative.   Neurological: Negative.   Endo/Heme/Allergies: Negative.   Psychiatric/Behavioral: Positive for depression (Stable). Negative for suicidal ideas, hallucinations, memory loss and substance abuse. The patient has insomnia (Stable). The patient is not nervous/anxious.     Blood pressure 107/77, pulse 88, temperature 98.3 F (36.8 C), temperature source Oral, resp. rate 12, height 5\' 1"  (1.549 m), weight 93.441 kg (206 lb).Body mass index is 38.94 kg/(m^2).  See Md's SRA   Have you used any form of tobacco in the last 30 days? (Cigarettes, Smokeless Tobacco, Cigars, and/or Pipes): Yes  Has this patient used any form of tobacco in the last 30 days? (Cigarettes, Smokeless Tobacco, Cigars, and/or Pipes) Yes, Prescription for nicorette gum provided.  Metabolic Disorder Labs:  Lab Results  Component Value Date   HGBA1C 5.8 09/20/2012   No results found for: PROLACTIN Lab Results  Component Value Date   CHOL 341* 12/20/2012   TRIG 513* 12/20/2012   HDL 34* 12/20/2012   CHOLHDL 10.0 12/20/2012   VLDL NOT CALC 12/20/2012   [place]  12/20/2012     Comment:       Not calculated due to Triglyceride >400. Suggest ordering Direct LDL (Unit Code: [phone]).   Total Cholesterol/HDL Ratio:CHD Risk                        Coronary Heart Disease Risk Table                                        Men       Women          1/2 Average Risk              3.4        3.3              Average Risk              5.0        4.4           2X Average Risk              9.6        7.1           3X Average Risk             23.4       11.0 Use the calculated Patient Ratio above and the CHD Risk table  to determine the patient's CHD  Risk. ATP III Classification (LDL):       < 100        mg/dL         Optimal      100 - 129     mg/dL         Near or Above Optimal      130 - 159     mg/dL         Borderline High      160 - 189      mg/dL         High       > 190        mg/dL         Very High     LDLCALC 183* 04/21/2012   See Psychiatric Specialty Exam and Suicide Risk Assessment completed by Attending Physician prior to discharge.  Discharge destination:  Home  Is patient on multiple antipsychotic therapies at discharge:  No   Has Patient had three or more failed trials of antipsychotic monotherapy by history:  No  Recommended Plan for Multiple Antipsychotic Therapies: NA    Medication List    STOP taking these medications        doxepin 100 MG capsule  Commonly known as:  SINEQUAN     GAS-X PO     triamterene-hydrochlorothiazide 37.5-25 MG tablet  Commonly known as:  MAXZIDE-25      TAKE these medications      Indication   acetaminophen 500 MG tablet  Commonly known as:  TYLENOL  Take 2 tablets (1,000 mg total) by mouth every 6 (six) hours as needed for headache.   Indication:  Headaches     amLODipine 5 MG tablet  Commonly known as:  NORVASC  Take 1 tablet (5 mg total) by mouth daily. For high blood pressure   Indication:  High Blood Pressure     ARIPiprazole 2 MG tablet  Commonly known as:  ABILIFY  Take 1 tablet (2 mg total) by mouth daily. For mood control   Indication:  Mood control     clopidogrel 75 MG tablet  Commonly known as:  PLAVIX  Take 1 tablet (75 mg total) by mouth daily. For Peripheral Arterial disease   Indication:  Disease of the Peripheral Arteries     cyclobenzaprine 10 MG tablet  Commonly known as:  FLEXERIL  Take 1 tablet (10 mg total) by mouth every 8 (eight) hours as needed for muscle spasms.   Indication:  Muscle Spasm     hydrOXYzine 25 MG tablet  Commonly known as:  ATARAX/VISTARIL  Take 1 tablet (25 mg total) by mouth every 6 (six) hours as needed for anxiety (sleep).   Indication:  Anxiety, insomnia     levothyroxine 112 MCG tablet  Commonly known as:  SYNTHROID, LEVOTHROID  Take 1 tablet (112 mcg total) by mouth daily before breakfast. For low  thyroid function   Indication:  Underactive Thyroid     lisinopril 40 MG tablet  Commonly known as:  PRINIVIL,ZESTRIL  Take 1 tablet (40 mg total) by mouth daily. For high blood pressure   Indication:  High Blood Pressure     nicotine polacrilex 2 MG gum  Commonly known as:  NICORETTE  Take 1 each (2 mg total) by mouth as needed for smoking cessation.   Indication:  Nicotine Addiction     pantoprazole 40 MG tablet  Commonly known as:  PROTONIX  Take 1 tablet (40 mg total) by  mouth daily. For acid reflux   Indication:  Gastroesophageal Reflux Disease     sertraline 50 MG tablet  Commonly known as:  ZOLOFT  Take 3 tablets (150 mg total) by mouth daily. For depression   Indication:  Major Depressive Disorder     traZODone 100 MG tablet  Commonly known as:  DESYREL  Take 1 tablet (100 mg total) by mouth at bedtime. For sleep   Indication:  Trouble Sleeping       Follow-up Information    Follow up with [facility] 02/26/2015.   Why:  Hospital follow up appt for medication management on Monday Feb. 13th at 2:15pm with Dr. Heber Franklin (Dr. Redmond Pulling not available). Call office if you need to reschedule.    Contact information:   [address] [place], [place] [address] [phone]       Follow up with [facility].   Why:  Therapy appt with Richardo Priest, LCSW on Monday February 13th at 11am. Please arrive ten minutes early and bring your insurance card. Call office by Friday morning if you need to reschedule appt.    Contact information:   [address] [place], [place] [address] [phone]    Follow-up recommendations:  Activity:  As tolerated Diet: As recommended by your primary care doctor. Keep all scheduled follow-up appointments as recommended.  Comments: Take all your medications as prescribed by your mental healthcare provider. Report any adverse effects and or reactions from your medicines to your outpatient provider  promptly. Patient is instructed and cautioned to not engage in alcohol and or illegal drug use while on prescription medicines. In the event of worsening symptoms, patient is instructed to call the crisis hotline, 911 and or go to the nearest ED for appropriate evaluation and treatment of symptoms. Follow-up with your primary care provider for your other medical issues, concerns and or health care needs.   Signed: Encarnacion Slates, NP, PMHNP, FNP-BC 02/21/2015, 3:10 PM  Patient seen, Suicide Assessment Completed.  Disposition Plan Reviewed

## 2015-02-21 NOTE — Progress Notes (Signed)
   D: Pt stated that after discharge she plans to "move in with a friend, get her own place, then rebuild her skills to go back to work". Pt informed the writer that she has anxiety and agoraphobia. Pt has no questions or concerns.   A:  Support and encouragement was offered. 15 min checks continued for safety.  R: Pt remains safe.

## 2015-02-21 NOTE — BHH Suicide Risk Assessment (Signed)
[facility] Discharge Suicide Risk Assessment   Principal Problem:  Major Depression, Recurrent, Severe Discharge Diagnoses:  Patient Active Problem List   Diagnosis Date Noted  . MDD (major depressive disorder), recurrent episode, severe ([place]) [F33.2] 02/13/2015  . Dysphagia [R13.10] 10/26/2014  . New onset of headaches after age 65 [R51] 09/19/2014  . Pulsatile tinnitus of left ear [id number].[id number]] 09/19/2014  . Blurry vision [H53.8] 09/19/2014  . Excessive daytime sleepiness [G47.19] 09/19/2014  . Morbid obesity ([place]) [E66.01] 09/19/2014  . Nocturnal headaches [R51] 09/19/2014  . Left-sided weakness [M62.89] 04/20/2014  . Tobacco use disorder [F17.200] 04/19/2014  . Blood in stool [K92.1] 01/30/2013  . Hematuria, gross [R31.0] 01/30/2013  . Intermittent claudication ([place]) [I73.9] 01/20/2013  . Eye mass [H57.8] 12/13/2012  . Cataract [H26.9] 12/13/2012  . Recurrent headache [R51] 12/13/2012  . Major depressive disorder, recurrent episode, severe ([place]) [F33.2] 10/29/2012  . Anxiety state, unspecified [F41.1] 09/20/2012  . Abnormality of gait [R26.9] 04/21/2012  . PAD (peripheral artery disease) ([place]) [I73.9] 04/15/2012  . Hyperlipidemia [E78.5] 04/15/2012  . Hypertension [I10] 04/15/2012  . Hypothyroidism [E03.9] 04/15/2012  . GERD (gastroesophageal reflux disease) [K21.9] 04/15/2012  . Routine health maintenance [Z00.00] 04/15/2012    Total Time spent with patient: 30 minutes  Musculoskeletal: Strength & Muscle Tone: within normal limits Gait & Station: normal Patient leans: N/A  Psychiatric Specialty Exam: ROS  Blood pressure 107/77, pulse 88, temperature 98.3 F (36.8 C), temperature source Oral, resp. rate 12, height 5\' 1"  (1.549 m), weight 206 lb (93.441 kg).Body mass index is 38.94 kg/(m^2).  General Appearance: Well Groomed  Engineer, water::  Good  Speech:  Normal Rate409  Volume:  Normal  Mood:  improved, states " I'm OK today"  Affect:  Appropriate  Thought Process:  Linear   Orientation:  Full (Time, Place, and Person)  Thought Content:  no hallucinations, no delusions, not internally preoccupied   Suicidal Thoughts:  No- denies any suicidal ideations   Homicidal Thoughts:  No  Memory:  recent and remote grossly intact   Judgement:  Other:  improved   Insight:  present  Psychomotor Activity:  Normal  Concentration:  Good  Recall:  Good  Fund of Knowledge:Good  Language: Good  Akathisia:  Negative  Handed:  Right  AIMS (if indicated):     Assets:  Communication Skills Desire for Improvement Resilience Social Support  Sleep:  Number of Hours: 46  Cognition: WNL  ADL's:  Intact   Mental Status Per Nursing Assessment::   On Admission:     Demographic Factors:  65 year old female, widowed, has one adult daughter , will be living with a friend   Loss Factors: Widowed ( husband passed away last year ), housing difficulties, unemployment   Historical Factors: History of depression, no history of suicide attempts   Risk Reduction Factors:   Sense of responsibility to family, Living with another person, especially a relative, Positive social support and Positive coping skills or problem solving skills  Continued Clinical Symptoms:  At this time patient improved compared to admission- mood improved, affect more reactive, no thought disorder, no SI or HI, no psychotic symptoms, future oriented, thinking of getting a job.  Of  Note , continues to describe insomnia, which has not improved completely on Ambien, and which continues to concern her. We have discussed other medication options, and she agrees to Trazodone- we discussed side effects and importance of not driving if sedated. Will start Trazodone 100 mgrs QHS for insomnia, D/C Ambien  Cognitive Features That Contribute To Risk:  No gross cognitive deficits noted upon discharge. Is alert , attentive, and oriented x 3   Suicide Risk:  Mild:  Suicidal ideation of limited frequency, intensity,  duration, and specificity.  There are no identifiable plans, no associated intent, mild dysphoria and related symptoms, good self-control (both objective and subjective assessment), few other risk factors, and identifiable protective factors, including available and accessible social support.    Plan Of Care/Follow-up recommendations:  Activity:  as tolerated  Diet:  Heart Healthy Tests:  NA Other:  See below  Patient is leaving unit in good spirits . Plans to go live with a supportive friend. She has a PCP for medical issues as needed - Dr. Duwaine Maxin Patient encouraged to continue working on smoking cessation efforts .  Neita Garnet, MD 02/21/2015, 10:29 AM

## 2015-02-26 ENCOUNTER — Encounter: Payer: Self-pay | Admitting: Internal Medicine

## 2015-02-26 ENCOUNTER — Ambulatory Visit: Payer: Commercial Managed Care - HMO | Admitting: Licensed Clinical Social Worker

## 2015-02-26 ENCOUNTER — Ambulatory Visit: Payer: Self-pay | Admitting: Internal Medicine

## 2015-02-26 ENCOUNTER — Other Ambulatory Visit: Payer: Self-pay | Admitting: Internal Medicine

## 2015-02-28 ENCOUNTER — Ambulatory Visit (INDEPENDENT_AMBULATORY_CARE_PROVIDER_SITE_OTHER): Payer: Medicare HMO | Admitting: Licensed Clinical Social Worker

## 2015-02-28 DIAGNOSIS — F332 Major depressive disorder, recurrent severe without psychotic features: Secondary | ICD-10-CM

## 2015-02-28 DIAGNOSIS — F411 Generalized anxiety disorder: Secondary | ICD-10-CM | POA: Diagnosis not present

## 2015-03-02 ENCOUNTER — Other Ambulatory Visit: Payer: Self-pay | Admitting: Internal Medicine

## 2015-03-02 DIAGNOSIS — Z9889 Other specified postprocedural states: Secondary | ICD-10-CM

## 2015-03-02 DIAGNOSIS — Z1231 Encounter for screening mammogram for malignant neoplasm of breast: Secondary | ICD-10-CM

## 2015-03-08 ENCOUNTER — Telehealth: Payer: Self-pay

## 2015-03-08 NOTE — Telephone Encounter (Signed)
Phone call from pt.  Is requesting an earlier appt. for pain and swelling in legs, with (L) > (R).  Stated the pain starts in the buttocks with walking and travels down into the leg.  Reported the pain will somewhat ease up with stopping to rest, but doesn't completely resolve. Also c/o pain in legs at night.  Stated she elevates her legs on pillows, due to the swelling.  Also, reported she has left leg numbness if she sits too long, or if she lays on left side.  Denied any open sores of lower extremities.  Reported she has several bruises on legs.  Is currently taking Plavix.   Advised will have a Scheduler contact her re: possibly moving appt. to an earlier date.  Agreed.

## 2015-03-09 NOTE — Telephone Encounter (Signed)
Spoke with patient to move appt up to 03/20/2015, dpm

## 2015-03-12 ENCOUNTER — Other Ambulatory Visit: Payer: Self-pay | Admitting: *Deleted

## 2015-03-12 DIAGNOSIS — I739 Peripheral vascular disease, unspecified: Secondary | ICD-10-CM

## 2015-03-13 ENCOUNTER — Ambulatory Visit (INDEPENDENT_AMBULATORY_CARE_PROVIDER_SITE_OTHER): Payer: Commercial Managed Care - HMO | Admitting: Internal Medicine

## 2015-03-13 ENCOUNTER — Encounter: Payer: Self-pay | Admitting: Vascular Surgery

## 2015-03-13 ENCOUNTER — Encounter: Payer: Self-pay | Admitting: Internal Medicine

## 2015-03-13 VITALS — BP 131/75 | HR 87 | Temp 98.7°F | Wt 212.1 lb

## 2015-03-13 DIAGNOSIS — K219 Gastro-esophageal reflux disease without esophagitis: Secondary | ICD-10-CM | POA: Diagnosis not present

## 2015-03-13 DIAGNOSIS — I739 Peripheral vascular disease, unspecified: Secondary | ICD-10-CM | POA: Diagnosis not present

## 2015-03-13 DIAGNOSIS — Z79899 Other long term (current) drug therapy: Secondary | ICD-10-CM

## 2015-03-13 DIAGNOSIS — Z0000 Encounter for general adult medical examination without abnormal findings: Secondary | ICD-10-CM

## 2015-03-13 DIAGNOSIS — I10 Essential (primary) hypertension: Secondary | ICD-10-CM | POA: Diagnosis not present

## 2015-03-13 DIAGNOSIS — E039 Hypothyroidism, unspecified: Secondary | ICD-10-CM | POA: Diagnosis not present

## 2015-03-13 MED ORDER — TRAMADOL HCL 50 MG PO TABS: 100.0000 mg | ORAL_TABLET | Freq: Two times a day (BID) | ORAL | Status: DC | PRN

## 2015-03-13 MED ORDER — PANTOPRAZOLE SODIUM 40 MG PO TBEC: 40.0000 mg | DELAYED_RELEASE_TABLET | Freq: Two times a day (BID) | ORAL | Status: DC

## 2015-03-13 NOTE — Progress Notes (Signed)
Patient ID: [name], female   DOB: 1950/08/11, 65 y.o.   MRN: [id number]    Subjective:   Patient ID: [name] female   DOB: [birth year] 65 y.o.   MRN: [id number]  HPI: [name] is a 65 y.o. pleasant woman with past medical history of hypertension, PAD s/p stenting,  hypothyroidism on replacement, depression, hyperlipidemia, CKD Stage 3, cocaine use, tobacco use, and GERD who presents with chief complaint of bilateral leg pain.   She has history of PAD s/p left SFA and left iliac stent with claudication symptoms after walking a very short distance and resting b/l LE pain with swelling. She reports taking tramadol in the past which helped relieve the pain. She reports taking plavix daily. She is trying to cut back on smoking. She is not currently on statin therapy but was previously supposed to be on crestor 20 mg daily. She has never had LE DVT. She denies skins changes or coldness of her legs. She is to see Vascular Surgeon Dr. Kellie Simmering on 03/20/15.   She reports compliance with taking lisinopril and amlodipine for hypertension. She would like to see if she can come off the amlodipine due to LE edema. She has headache but denies blurry vision, chest pain, or lightheadedness.    She has history of PUD and GERD compliant with protonix 40 mg daily but reports uncontrolled symptoms. She denies alarm symptoms. 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   She has hypothyroidism s/p total thyroidectomy compliant with synthroid 112 mcg daily. Her last TSH during recent hospitalization was mildly elevated at 5.369 on 02/15/15. She denies symptoms of hypothyroidism.   She declines flu, tdap, PCV13, and zoster vaccinations.   She would like to have DEXA scan. She is already scheduled for mammogram.      Past Medical History  Diagnosis Date  . Hypothyroidism   . Hypertension   . TIA (transient ischemic attack)   . PUD (peptic ulcer disease)   . HLD (hyperlipidemia)   .  Depression   . GERD (gastroesophageal reflux disease)   . Diverticulosis   . PAD (peripheral artery disease) ([place])   . Anal fissure   . Anxiety   . Chronic headaches   . Cataract   . Glaucoma   . Allergy    Current Outpatient Prescriptions  Medication Sig Dispense Refill  . acetaminophen (TYLENOL) 500 MG tablet Take 2 tablets (1,000 mg total) by mouth every 6 (six) hours as needed for headache. 30 tablet 0  . amLODipine (NORVASC) 5 MG tablet Take 1 tablet (5 mg total) by mouth daily. For high blood pressure    . ARIPiprazole (ABILIFY) 2 MG tablet Take 1 tablet (2 mg total) by mouth daily. For mood control 30 tablet 0  . clopidogrel (PLAVIX) 75 MG tablet Take 1 tablet (75 mg total) by mouth daily. For Peripheral Arterial disease 1 tablet 0  . cyclobenzaprine (FLEXERIL) 10 MG tablet Take 1 tablet (10 mg total) by mouth every 8 (eight) hours as needed for muscle spasms. 60 tablet 6  . hydrOXYzine (ATARAX/VISTARIL) 25 MG tablet Take 1 tablet (25 mg total) by mouth every 6 (six) hours as needed for anxiety (sleep). 60 tablet 0  . levothyroxine (SYNTHROID, LEVOTHROID) 112 MCG tablet Take 1 tablet (112 mcg total) by mouth daily before breakfast. For low thyroid function 1 tablet 0  . lisinopril (PRINIVIL,ZESTRIL) 40 MG tablet Take 1 tablet (40 mg total) by mouth daily. For high blood pressure  1 tablet 1  . nicotine polacrilex (NICORETTE) 2 MG gum Take 1 each (2 mg total) by mouth as needed for smoking cessation. 100 tablet 0  . pantoprazole (PROTONIX) 40 MG tablet Take 1 tablet (40 mg total) by mouth daily. For acid reflux 1 tablet 1  . sertraline (ZOLOFT) 50 MG tablet Take 3 tablets (150 mg total) by mouth daily. For depression 90 tablet 0  . traZODone (DESYREL) 100 MG tablet Take 1 tablet (100 mg total) by mouth at bedtime. For sleep 30 tablet 0   No current facility-administered medications for this visit.   Family History  Problem Relation Age of Onset  . Peripheral vascular disease Mother    . Heart disease Mother   . Hypertension Mother   . AAA (abdominal aortic aneurysm) Father   . Hypertension Father   . Uterine cancer Sister     mets to brain  . Liver cancer Brother   . Stomach cancer Maternal Aunt     x2  . Stomach cancer Paternal Aunt   . Heart disease Brother   . Kidney cancer Brother   . Anuerysm Brother     brain  . HIV Brother     x2  . Aneurysm Father    Social History   Social History  . Marital Status: Widowed    Spouse Name: N/A  . Number of Children: 1  . Years of Education: 12   Occupational History  . Disabled     Social History Main Topics  . Smoking status: Current Every Day Smoker -- 0.50 packs/day for .5 years    Types: Cigarettes  . Smokeless tobacco: Never Used  . Alcohol Use: No  . Drug Use: 1.00 per week    Special: Cocaine, Marijuana     Comment: marijuana  . Sexual Activity: No   Other Topics Concern  . Not on file   Social History Narrative   Live at home alone.   Caffeine use: Drinks coffee once/day   Soda- 2-3 times per day      Review of Systems: Review of Systems  Constitutional: Negative for fever and chills.  Eyes: Negative for blurred vision.  Respiratory: Negative for cough, shortness of breath and wheezing.   Cardiovascular: Positive for claudication (b/l LE) and leg swelling (b/l). Negative for chest pain and palpitations.  Gastrointestinal: Positive for heartburn (uncontrolled) and constipation. Negative for nausea, vomiting, abdominal pain, diarrhea and blood in stool.  Genitourinary: Negative for dysuria, urgency, frequency and hematuria.  Musculoskeletal: Positive for myalgias.  Neurological: Positive for dizziness, sensory change (b/l LE) and headaches.  Psychiatric/Behavioral: Positive for depression (controlled on medical therapy).    Objective:  Physical Exam: Filed Vitals:   03/13/15 0928  BP: 131/75  Pulse: 87  Temp: 98.7 F (37.1 C)  TempSrc: Oral  Weight: 212 lb 1.6 oz (96.208 kg)    SpO2: 99%    Physical Exam  Constitutional: She is oriented to person, place, and time. She appears well-developed and well-nourished. No distress.  HENT:  Head: Normocephalic and atraumatic.  Right Ear: External ear normal.  Left Ear: External ear normal.  Nose: Nose normal.  Mouth/Throat: Oropharynx is clear and moist. No oropharyngeal exudate.  Eyes: Conjunctivae and EOM are normal. Pupils are equal, round, and reactive to light. Right eye exhibits no discharge. Left eye exhibits no discharge. No scleral icterus.  Neck: Normal range of motion. Neck supple. No thyromegaly present.  Cardiovascular: Normal rate, regular rhythm and normal heart sounds.  Pulmonary/Chest: Effort normal and breath sounds normal. No respiratory distress. She has no wheezes. She has no rales.  Abdominal: Soft. Bowel sounds are normal. She exhibits no distension. There is no tenderness. There is no rebound and no guarding.  Musculoskeletal: Normal range of motion. She exhibits edema (trace b/l LE) and tenderness (b/l LE).  Neurological: She is alert and oriented to person, place, and time.  Skin: Skin is warm and dry. No rash noted. She is not diaphoretic. No erythema. No pallor.  Psychiatric: She has a normal mood and affect. Her behavior is normal. Judgment and thought content normal.    Assessment & Plan:   Please see problem list for problem-based assessment and plan

## 2015-03-13 NOTE — Patient Instructions (Addendum)
-Don't take amlodipine for blood pressure, will recheck your blood pressure next time to see if you still need it -Start taking tramadol 100 mg twice a day as needed for leg pain. Make sure you attend your appt with vascular surgery.  -Increase your protonix from 40 mg daily to twice a day for acid reflux and make sure you take it at least 30 minutes before you eat -Will schedule you for a bone mineral density scan -Your mammogram is on 3/14 -Please come back the middle to end of March to recheck your thyroid levels  -Very nice meeting you!   Peripheral Vascular Disease Peripheral vascular disease (PVD) is a disease of the blood vessels that are not part of your heart and brain. A simple term for PVD is poor circulation. In most cases, PVD narrows the blood vessels that carry blood from your heart to the rest of your body. This can result in a decreased supply of blood to your arms, legs, and internal organs, like your stomach or kidneys. However, it most often affects a person's lower legs and feet. There are two types of PVD.  Organic PVD. This is the more common type. It is caused by damage to the structure of blood vessels.  Functional PVD. This is caused by conditions that make blood vessels contract and tighten (spasm). Without treatment, PVD tends to get worse over time. PVD can also lead to acute ischemic limb. This is when an arm or limb suddenly has trouble getting enough blood. This is a medical emergency.  HOME CARE  Take medicines only as told by your doctor.  Do not use any tobacco products, including cigarettes, chewing tobacco, or electronic cigarettes. If you need help quitting, ask your doctor.  Lose weight if you are overweight, and maintain a healthy weight as told by your doctor.  Eat a diet that is low in fat and cholesterol. If you need help, ask your doctor.  Exercise regularly. Ask your doctor for some good activities for you.  Take good care of your  feet.  Wear comfortable shoes that fit well.  Check your feet often for any cuts or sores. GET HELP IF:  You have cramps in your legs while walking.  You have leg pain when you are at rest.  You have coldness in a leg or foot.  Your skin changes.  You are unable to get or have an erection (erectile dysfunction).  You have cuts or sores on your feet that are not healing. GET HELP RIGHT AWAY IF:  Your arm or leg turns cold and blue.  Your arms or legs become red, warm, swollen, painful, or numb.  You have chest pain or trouble breathing.  You suddenly have weakness in your face, arm, or leg.  You become very confused or you cannot speak.  You suddenly have a very bad headache.  You suddenly cannot see.   This information is not intended to replace advice given to you by your health care provider. Make sure you discuss any questions you have with your health care provider.   Document Released: 03/26/2009 Document Revised: 01/20/2014 Document Reviewed: 06/09/2013 Elsevier Interactive Patient Education 2016 [place] Instructions:   Please bring your medicines with you each time you come to clinic.  Medicines may include prescription medications, over-the-counter medications, herbal remedies, eye drops, vitamins, or other pills.   Progress Toward Treatment Goals:  Treatment Goal 12/13/2012  Blood pressure unchanged  Stop smoking smoking the  same amount    Self Care Goals & Plans:  Self Care Goal 10/25/2014  Manage my medications take my medicines as prescribed; bring my medications to every visit; refill my medications on time  Monitor my health -  Eat healthy foods drink diet soda or water instead of juice or soda; eat more vegetables; eat foods that are low in salt; eat baked foods instead of fried foods; eat fruit for snacks and desserts  Stop smoking cut down the number of cigarettes smoked  Meeting treatment goals maintain the current  self-care plan    No flowsheet data found.   Care Management & Community Referrals:  Referral 09/20/2012  Referrals made for care management support social worker

## 2015-03-14 ENCOUNTER — Encounter: Payer: Self-pay | Admitting: Internal Medicine

## 2015-03-14 NOTE — Assessment & Plan Note (Signed)
Assessment: Pt with hypothyroidism s/p total thyroidectomy in 2011 compliant with replacement therapy with last TSH during recent hospitalization mildly elevated at 5.369 on 02/15/15 who presents with no thyroid dysfunction symptoms.   Plan:  -Repeat TSH level in 6 weeks (after 3/16) -Continue synthroid 112 mcg daily

## 2015-03-14 NOTE — Assessment & Plan Note (Signed)
Assessment: Pt with history of PUD and GERD compliant with PPI therapy who presents with uncontrolled symptoms with no alarm symptoms.  Plan:  -Increase protonix 40 mg daily to BID  -Monitor for alarm symptoms warranting EGD

## 2015-03-14 NOTE — Assessment & Plan Note (Signed)
Assessment: Pt with well-controlled hypertension compliant with one-class (ACEi) anti-hypertensive therapy who presents with blood pressure of 131/75.   Plan:  -BP 131/75 at goal <140/90  -Continue lisinopril 40 mg daily  -Trial off amlodipine 5 mg daily in setting of LE edema

## 2015-03-14 NOTE — Assessment & Plan Note (Addendum)
-  Pt scheduled for screening mammogram on 03/27/15  -Ordered DEXA scan -Obtain screening HCV at next visit  -Obtain annual lipid panel at next visit, last one on 12/20/12 with TG level of 513 -Pt declines influenza, tdap, zoster, and PCV13 vaccinations

## 2015-03-14 NOTE — Assessment & Plan Note (Addendum)
Assessment: Pt with worsening claudication in setting of PAD s/p left SFA and left iliac stent who presents with uncontrolled pain.   Plan: -Prescribe tramadol 100 mg BID PRN pain #60  -Continue plavix 75 mg daily -Consider restarting crestor 20 mg daily, needs repeat lipid panel  -Pt counseled on tobacco cessation -Pt to see Vascular Surgeon Dr. Kellie Simmering on 03/20/15

## 2015-03-16 NOTE — Progress Notes (Signed)
Internal Medicine Clinic Attending  Case discussed with Dr. Rabbani soon after the resident saw the patient.  We reviewed the resident's history and exam and pertinent patient test results.  I agree with the assessment, diagnosis, and plan of care documented in the resident's note.  

## 2015-03-19 ENCOUNTER — Ambulatory Visit: Payer: Commercial Managed Care - HMO | Admitting: Licensed Clinical Social Worker

## 2015-03-20 ENCOUNTER — Other Ambulatory Visit: Payer: Self-pay | Admitting: *Deleted

## 2015-03-20 ENCOUNTER — Ambulatory Visit (HOSPITAL_COMMUNITY)
Admission: RE | Admit: 2015-03-20 | Discharge: 2015-03-20 | Disposition: A | Discharge status: Home / Self Care | Payer: Commercial Managed Care - HMO | Source: Ambulatory Visit | Attending: Vascular Surgery | Admitting: Vascular Surgery

## 2015-03-20 ENCOUNTER — Encounter: Payer: Self-pay | Admitting: Family

## 2015-03-20 ENCOUNTER — Ambulatory Visit (INDEPENDENT_AMBULATORY_CARE_PROVIDER_SITE_OTHER): Payer: Commercial Managed Care - HMO | Admitting: Family

## 2015-03-20 VITALS — BP 110/80 | HR 86 | Temp 97.6°F | Resp 18 | Ht 62.0 in | Wt 209.4 lb

## 2015-03-20 DIAGNOSIS — I739 Peripheral vascular disease, unspecified: Secondary | ICD-10-CM | POA: Diagnosis not present

## 2015-03-20 DIAGNOSIS — M79604 Pain in right leg: Secondary | ICD-10-CM

## 2015-03-20 DIAGNOSIS — Z720 Tobacco use: Secondary | ICD-10-CM

## 2015-03-20 DIAGNOSIS — I10 Essential (primary) hypertension: Secondary | ICD-10-CM | POA: Diagnosis not present

## 2015-03-20 DIAGNOSIS — F17200 Nicotine dependence, unspecified, uncomplicated: Secondary | ICD-10-CM

## 2015-03-20 DIAGNOSIS — I779 Disorder of arteries and arterioles, unspecified: Secondary | ICD-10-CM

## 2015-03-20 DIAGNOSIS — M79605 Pain in left leg: Secondary | ICD-10-CM

## 2015-03-20 DIAGNOSIS — E785 Hyperlipidemia, unspecified: Secondary | ICD-10-CM | POA: Insufficient documentation

## 2015-03-20 DIAGNOSIS — R0989 Other specified symptoms and signs involving the circulatory and respiratory systems: Secondary | ICD-10-CM | POA: Diagnosis present

## 2015-03-20 NOTE — Patient Instructions (Addendum)

## 2015-03-20 NOTE — Progress Notes (Signed)
[facility] [place] HISTORY AND PHYSICAL -PAD  History of Present Illness [name] is a 65 y.o. female patient of Dr. Kellie Simmering returns for further complaints regarding bilateral lower extremity pain with minimal ambulation. Patient has had a previous stent in her left SFA at [facility] in 2012. Dr. Bridgett Larsson inserted left iliac stent several months ago. She has some narrowing within the left superficial femoral stent demonstrated on angiogram. She states that she walk short distances and then begins having pain in her buttocks thighs and calves. She also has this while sitting on occasion. She has a history infection gangrene and nonhealing ulcers. She has no history of back problems.  Dr. Kellie Simmering last saw pt on 04/12/13. At that time duplex scan of both lower extremities and ABIs indicated left leg has triphasic flow in the foot with an ABI of 0.9 to a widely patent left SFA. Right leg has ABI of 0.75 with monophasic flow in it fairly well widely patent SFA and popliteal system on the right. Bilateral buttock, hip, thigh, and calf pain with minimal ambulation-not due to vascular etiology Consider spinal stenosis or some type of nerve compression syndrome.  No further vascular evaluation is indicated in this patient at this time. If she continues to have severe symptoms medical doctor might consider MRI of the spine or referral to a Back specialist.   She returns today by referral from Duwaine Maxin, pt's PCP, for PAD evaluation as pt is having rest pain in her left leg, states this started a month ago.  The rest pain is in her left knee to her foot. When she walks she has bilateral buttocks pain that radiates to both feet. She reports popping feeling and sound in her knees and hips when she walks.   Pt reports a TIA in 2010 while hospitalized, pt states caused by "tremendous stress", denies any subsequent TIA's or strokes.   The patient denies New Medical or Surgical History.  Pt  Diabetic: No Pt smoker: smoker  (pt admits to 1 cigarette/day)  Pt meds include: Statin :No, causes ithching Betablocker: No ASA: No, allergic to Other anticoagulants/antiplatelets: Plavix   Past Medical History  Diagnosis Date  . Hypothyroidism   . Hypertension   . TIA (transient ischemic attack)   . PUD (peptic ulcer disease)   . HLD (hyperlipidemia)   . Depression   . GERD (gastroesophageal reflux disease)   . Diverticulosis   . PAD (peripheral artery disease) ([place])   . Anal fissure   . Anxiety   . Chronic headaches   . Cataract   . Glaucoma   . Allergy     Social History Social History  Substance Use Topics  . Smoking status: Current Some Day Smoker -- 0.25 packs/day for .5 years    Types: Cigarettes  . Smokeless tobacco: Never Used     Comment: smokes 2 cigarettes every 3 days    . Alcohol Use: No    Family History Family History  Problem Relation Age of Onset  . Peripheral vascular disease Mother   . Heart disease Mother   . Hypertension Mother   . AAA (abdominal aortic aneurysm) Father   . Hypertension Father   . Uterine cancer Sister     mets to brain  . Liver cancer Brother   . Stomach cancer Maternal Aunt     x2  . Stomach cancer Paternal Aunt   . Heart disease Brother   . Kidney cancer Brother   .  Anuerysm Brother     brain  . HIV Brother     x2  . Aneurysm Father     Past Surgical History  Procedure Laterality Date  . Total thyroidectomy    . Aortogram    . Iliac artery angioplasty and stenting Left 12/2012  . Sfa stent Left 2012    at [facility]  . Abdominal aortagram N/A 01/20/2013    Procedure: ABDOMINAL Maxcine Ham;  Surgeon: Conrad [place], MD;  Location: [facility] CATH LAB;  Service: Cardiovascular;  Laterality: N/A;  . Anal fissure repair    . Cataract extraction Left   . Breast reduction surgery    . Stomach surgery      Allergies  Allergen Reactions  . Asa [Aspirin] Nausea And Vomiting  . Crestor  [Rosuvastatin Calcium] Itching  . Penicillins Hives and Itching    Has patient had a PCN reaction causing immediate rash, facial/tongue/throat swelling, SOB or lightheadedness with hypotension: Yes Has patient had a PCN reaction causing severe rash involving mucus membranes or skin necrosis: Yes Has patient had a PCN reaction that required hospitalization No Has patient had a PCN reaction occurring within the last 10 years: No If all of the above answers are "NO", then may proceed with Cephalosporin use.   . Gadolinium Derivatives Other (See Comments)    Chest tightness    Current Outpatient Prescriptions  Medication Sig Dispense Refill  . acetaminophen (TYLENOL) 500 MG tablet Take 2 tablets (1,000 mg total) by mouth every 6 (six) hours as needed for headache. 30 tablet 0  . ARIPiprazole (ABILIFY) 2 MG tablet Take 1 tablet (2 mg total) by mouth daily. For mood control 30 tablet 0  . clopidogrel (PLAVIX) 75 MG tablet Take 1 tablet (75 mg total) by mouth daily. For Peripheral Arterial disease 1 tablet 0  . cyclobenzaprine (FLEXERIL) 10 MG tablet Take 1 tablet (10 mg total) by mouth every 8 (eight) hours as needed for muscle spasms. 60 tablet 6  . hydrOXYzine (ATARAX/VISTARIL) 25 MG tablet Take 1 tablet (25 mg total) by mouth every 6 (six) hours as needed for anxiety (sleep). 60 tablet 0  . levothyroxine (SYNTHROID, LEVOTHROID) 112 MCG tablet Take 1 tablet (112 mcg total) by mouth daily before breakfast. For low thyroid function 1 tablet 0  . lisinopril (PRINIVIL,ZESTRIL) 40 MG tablet Take 1 tablet (40 mg total) by mouth daily. For high blood pressure 1 tablet 1  . nicotine polacrilex (NICORETTE) 2 MG gum Take 1 each (2 mg total) by mouth as needed for smoking cessation. 100 tablet 0  . pantoprazole (PROTONIX) 40 MG tablet Take 1 tablet (40 mg total) by mouth 2 (two) times daily. For acid reflux 180 tablet 3  . sertraline (ZOLOFT) 50 MG tablet Take 3 tablets (150 mg total) by mouth daily. For  depression 90 tablet 0  . traMADol (ULTRAM) 50 MG tablet Take 2 tablets (100 mg total) by mouth every 12 (twelve) hours as needed. 60 tablet 0  . traZODone (DESYREL) 100 MG tablet Take 1 tablet (100 mg total) by mouth at bedtime. For sleep 30 tablet 0   No current facility-administered medications for this visit.    ROS: See HPI for pertinent positives and negatives.   Physical Examination  Filed Vitals:   03/20/15 1045  BP: 110/80  Pulse: 86  Temp: 97.6 F (36.4 C)  TempSrc: Oral  Resp: 18  Height: 5\' 2"  (1.575 m)  Weight: 209 lb 6.4 oz (94.983 kg)  SpO2: 98%   Body mass index is 38.29 kg/(m^2).  General: A&O x 3, WDWN, obese female. Gait: antalgic Eyes: PERRLA. Pulmonary: CTAB, non labored respirations Cardiac: regular rhythm, no detected murmur.         Carotid Bruits Right Left   Negative Negative  Aorta is not palpable. Radial pulses: 2+ palpable and =                           VASCULAR EXAM: Extremities without ischemic changes, without Gangrene; without open wounds.                                                                                                          LE Pulses Right Left       FEMORAL  not palpable , pt is obese and has pain to touch  not palpable,  pt is obese and has pain to touch        POPLITEAL  not palpable   not palpable       POSTERIOR TIBIAL  pt has pain to light touch    pt has pain to light touch        DORSALIS PEDIS      ANTERIOR TIBIAL  pt has pain to light touch pt has pain to light touch    Abdomen: soft, NT, no palpable masses, but unable to palpate adequately as  pt has pain to light touch. Skin: no rashes, no ulcers. Musculoskeletal: no muscle wasting or atrophy.  Neurologic: A&O X 3; Appropriate Affect, sensation is hyperesthetic; MOTOR FUNCTION:  moving all extremities equally, motor strength 5/5 in upper extremities, 3/5 in lower extremities. Speech is fluent/normal. CN 2-12 intact. Severe pain in both legs,  hips, and low back with straight leg raise test    Non-Invasive Vascular Imaging: DATE: 03/20/2015 ABI (Date: 03/20/2015)  R: 0.67 (0.70, 10/13/14), DP: monophasic, PT: monophasic, TBI: 0.50  L: 0.81 (0.84), DP: biphasic, PT: biphasic, TBI: 0.61   ASSESSMENT: [name] is a 65 y.o. female who presents with: severe pain in both legs at rest, positive and severely painful straight leg raise test in both legs, consistent with radiculopathy. This degree of pain is not c/w mild arterial occlusive disease in her left leg and moderate in her right leg. She has no signs of ischemia in her feet/legs.  Recommend that pt be evaluated for lumbar spine compression and OA in her hips and knees as etiology of pain, defer to pt's PCP.   PLAN:  The patient was counseled re smoking cessation and given several free resources re smoking cessation.   Pt is unable to conduct a graduated walking program as she has radiculopathy type pain with walking. Recommend that pt be evaluated for lumbar spine compression and OA in her hips and knees as etiology of pain, defer to pt's PCP.   Based on the patient's vascular studies and examination, and after discussing with Dr. Kellie Simmering, pt will return to clinic in 1 year with ABI's and bilateral aortoiliac duplex.  I discussed in depth with the patient the nature of atherosclerosis, and emphasized the importance of maximal medical management including strict control of blood pressure, blood glucose, and lipid levels, obtaining regular exercise, and cessation of smoking.  The patient is aware that without maximal medical management the underlying atherosclerotic disease process will progress, limiting the benefit of any interventions.  The patient was given information about PAD including signs, symptoms, treatment, what symptoms should prompt the patient to seek immediate medical care, and risk reduction measures to take.  Clemon Chambers, RN, MSN, FNP-C [facility] Phone: [phone]  Clinic MD: Kellie Simmering  03/20/2015 10:45 AM

## 2015-03-21 ENCOUNTER — Other Ambulatory Visit: Payer: Self-pay | Admitting: Internal Medicine

## 2015-03-27 ENCOUNTER — Ambulatory Visit
Admission: RE | Admit: 2015-03-27 | Discharge: 2015-03-27 | Disposition: A | Discharge status: Home / Self Care | Payer: Commercial Managed Care - HMO | Source: Ambulatory Visit | Attending: Internal Medicine | Admitting: Internal Medicine

## 2015-03-27 DIAGNOSIS — Z1231 Encounter for screening mammogram for malignant neoplasm of breast: Secondary | ICD-10-CM

## 2015-03-27 DIAGNOSIS — Z9889 Other specified postprocedural states: Secondary | ICD-10-CM

## 2015-03-28 ENCOUNTER — Telehealth: Payer: Self-pay | Admitting: Internal Medicine

## 2015-03-28 NOTE — Telephone Encounter (Signed)
Pt states tramadol is not working, requesting the nurse to call back.

## 2015-03-29 NOTE — Telephone Encounter (Signed)
Pt called today unhappy, she stated she was going to need some pain medicine for her back and a referral to a neurosurgeon, she wanted something called in and the referral called, informed her she would need to be seen in clinic for med and referral, made an appt for her

## 2015-03-30 ENCOUNTER — Encounter: Payer: Self-pay | Admitting: Internal Medicine

## 2015-03-30 ENCOUNTER — Ambulatory Visit (INDEPENDENT_AMBULATORY_CARE_PROVIDER_SITE_OTHER): Payer: Commercial Managed Care - HMO | Admitting: Internal Medicine

## 2015-03-30 VITALS — BP 138/81 | HR 90 | Temp 98.3°F | Ht 62.0 in | Wt 208.9 lb

## 2015-03-30 DIAGNOSIS — M25561 Pain in right knee: Secondary | ICD-10-CM | POA: Diagnosis not present

## 2015-03-30 DIAGNOSIS — M25562 Pain in left knee: Secondary | ICD-10-CM

## 2015-03-30 MED ORDER — MELOXICAM 7.5 MG PO TABS: 7.5000 mg | ORAL_TABLET | Freq: Every day | ORAL | Status: DC

## 2015-03-30 NOTE — Assessment & Plan Note (Signed)
A: Pain is considerably worse on the left. This does not seem consistent with PAD given the symptomatology of the left knee. Her sx are more consistent with OA. We will need imaging to confirm the diagnosis. For now, we will treat with 7.5 mg meloxicam daily. If this is ineffective, she may need an dexamethasone shot.  P: 7.5 mg Mobic daily Follow up with Xray tomorrow Steroid injection on 3/26 if still ineffective

## 2015-03-30 NOTE — Progress Notes (Signed)
Subjective:    Patient ID: [name], female    DOB: March 05, 1950, 65 y.o.   MRN: [id number]  HPI  [name] is a 64 year old woman with a PMH as below who presents with severe left knee pain. The pain started approximately 2 months ago and has been progressively worsening. It now hurts "all the time" whether she is walking, sitting or lying down. She has taken tramadol and her husband's vicodin to no effect. She reports "cracking" when she stands and she says it feels like her bones are rubbing together. She specifies that her symptoms are unlike the pain she feels in her right leg from PAD. Of note, she was recently hospitalized for a major depressive episode. She denies any SI today.  Active Ambulatory Problems    Diagnosis Date Noted  . PAD (peripheral artery disease) ([place]) 04/15/2012  . Hyperlipidemia 04/15/2012  . Hypertension 04/15/2012  . Hypothyroidism 04/15/2012  . GERD (gastroesophageal reflux disease) 04/15/2012  . Routine health maintenance 04/15/2012  . Abnormality of gait 04/21/2012  . Anxiety state, unspecified 09/20/2012  . Major depressive disorder, recurrent episode, severe ([place]) 10/29/2012  . Eye mass 12/13/2012  . Cataract 12/13/2012  . Recurrent headache 12/13/2012  . Intermittent claudication ([place]) 01/20/2013  . Tobacco use disorder 04/19/2014  . Left-sided weakness 04/20/2014  . Pulsatile tinnitus of left ear 09/19/2014  . Blurry vision 09/19/2014  . Excessive daytime sleepiness 09/19/2014  . Morbid obesity ([place]) 09/19/2014  . Dysphagia 10/26/2014  . Severe episode of recurrent major depressive disorder, without psychotic features ([place])   . Knee pain, bilateral 03/30/2015   Resolved Ambulatory Problems    Diagnosis Date Noted  . PVD (peripheral vascular disease) with claudication 12/28/2012  . Blood in stool 01/30/2013  . Hematuria, gross 01/30/2013  . New onset of headaches after age 45 09/19/2014  . Nocturnal headaches 09/19/2014  . MDD (major  depressive disorder), recurrent episode, severe ([place]) 02/13/2015   Past Medical History  Diagnosis Date  . TIA (transient ischemic attack)   . PUD (peptic ulcer disease)   . HLD (hyperlipidemia)   . Depression   . Diverticulosis   . Anal fissure   . Anxiety   . Chronic headaches   . Glaucoma   . Allergy     Review of Systems  Constitutional: Positive for activity change and fatigue. Negative for fever.  HENT: Negative for congestion and sore throat.   Eyes: Negative for photophobia and visual disturbance.  Respiratory: Negative for cough, chest tightness, shortness of breath and wheezing.   Cardiovascular: Negative for chest pain and palpitations.  Gastrointestinal: Negative for abdominal pain, constipation and abdominal distention.  Musculoskeletal: Positive for back pain and arthralgias. Negative for joint swelling.  Neurological: Negative for syncope, light-headedness and numbness.  Psychiatric/Behavioral: Negative for dysphoric mood.       Objective:   Physical Exam  Constitutional: She is oriented to person, place, and time. She appears well-developed and well-nourished.  Intermittently frustrated by pain.  HENT:  Mouth/Throat: Oropharynx is clear and moist. No oropharyngeal exudate.  Eyes: Pupils are equal, round, and reactive to light. No scleral icterus.  Neck: Normal range of motion. Neck supple.  Cardiovascular: Normal rate, regular rhythm and normal heart sounds.   Pulmonary/Chest: Breath sounds normal. No respiratory distress. She has no wheezes.  Abdominal: Soft. Bowel sounds are normal. She exhibits no distension. There is no tenderness.  Genitourinary:  Extremely sensitive to very light tough of left knee. No effusion. Joint  is not warm on left.   Musculoskeletal: She exhibits no edema.  Lymphadenopathy:    She has no cervical adenopathy.  Neurological: She is alert and oriented to person, place, and time.  Skin: Skin is warm and dry.  Psychiatric: She  has a normal mood and affect. Her behavior is normal.  No SI.          Assessment & Plan:   Please see problem based assessment and plan for details.

## 2015-03-30 NOTE — Patient Instructions (Signed)
Ms. Krafft, it was a joy meeting you today.  I am so sorry your in such pain. We're going to do our best to address you pain.  First, we're going to start with a new medication. It's called Meloxicam (aka Mobic). You will take it once every day for a week. It may take several days to have a full effect. If it is not working, we're going to go ahead and schedule an appointment for next Friday for a steroid injection.  Please hang in there.   Don't forget about your Xrays tomorrow.

## 2015-04-02 NOTE — Progress Notes (Signed)
Internal Medicine Clinic Attending  Case discussed with Dr. Ford at the time of the visit.  We reviewed the resident's history and exam and pertinent patient test results.  I agree with the assessment, diagnosis, and plan of care documented in the resident's note.  

## 2015-04-06 ENCOUNTER — Ambulatory Visit: Payer: Self-pay | Admitting: Internal Medicine

## 2015-04-10 ENCOUNTER — Ambulatory Visit: Payer: Self-pay | Admitting: Internal Medicine

## 2015-04-10 ENCOUNTER — Encounter (HOSPITAL_COMMUNITY): Payer: Self-pay

## 2015-04-10 ENCOUNTER — Ambulatory Visit: Payer: Self-pay | Admitting: Vascular Surgery

## 2015-04-11 ENCOUNTER — Encounter: Payer: Self-pay | Admitting: Internal Medicine

## 2015-04-12 ENCOUNTER — Encounter: Payer: Self-pay | Admitting: Internal Medicine

## 2015-04-14 ENCOUNTER — Other Ambulatory Visit: Payer: Self-pay | Admitting: Internal Medicine

## 2015-06-01 ENCOUNTER — Other Ambulatory Visit: Payer: Self-pay | Admitting: *Deleted

## 2015-06-01 MED ORDER — MELOXICAM 7.5 MG PO TABS: 7.5000 mg | ORAL_TABLET | Freq: Every day | ORAL | Status: DC | PRN

## 2015-06-01 NOTE — Telephone Encounter (Signed)
Called no answer

## 2015-06-01 NOTE — Telephone Encounter (Signed)
Patient called and report that this medication improved her knee pain and would like a refill. Reports "excruciating" pain and would like to fill as soon as possible. Thanks!

## 2015-06-01 NOTE — Telephone Encounter (Signed)
Informed pt, made June appt w/ dr Redmond Pulling

## 2015-06-27 ENCOUNTER — Encounter: Payer: Self-pay | Admitting: Internal Medicine

## 2015-06-27 ENCOUNTER — Encounter: Payer: Self-pay | Admitting: *Deleted

## 2015-07-16 ENCOUNTER — Encounter: Payer: Self-pay | Admitting: Internal Medicine

## 2015-07-16 ENCOUNTER — Other Ambulatory Visit: Payer: Self-pay | Admitting: Neurology

## 2015-07-25 ENCOUNTER — Encounter: Payer: Self-pay | Admitting: Internal Medicine

## 2015-07-25 ENCOUNTER — Ambulatory Visit (INDEPENDENT_AMBULATORY_CARE_PROVIDER_SITE_OTHER): Payer: Commercial Managed Care - HMO | Admitting: Internal Medicine

## 2015-07-25 VITALS — BP 136/80 | HR 97 | Temp 97.7°F | Ht 61.0 in | Wt 220.2 lb

## 2015-07-25 DIAGNOSIS — M79662 Pain in left lower leg: Secondary | ICD-10-CM | POA: Diagnosis not present

## 2015-07-25 DIAGNOSIS — M7989 Other specified soft tissue disorders: Secondary | ICD-10-CM | POA: Diagnosis not present

## 2015-07-25 DIAGNOSIS — E669 Obesity, unspecified: Secondary | ICD-10-CM

## 2015-07-25 DIAGNOSIS — Z6841 Body Mass Index (BMI) 40.0 and over, adult: Secondary | ICD-10-CM

## 2015-07-25 DIAGNOSIS — F17200 Nicotine dependence, unspecified, uncomplicated: Secondary | ICD-10-CM

## 2015-07-25 DIAGNOSIS — E039 Hypothyroidism, unspecified: Secondary | ICD-10-CM | POA: Diagnosis not present

## 2015-07-25 MED ORDER — CYCLOBENZAPRINE HCL 10 MG PO TABS: 10.0000 mg | ORAL_TABLET | Freq: Three times a day (TID) | ORAL | Status: DC | PRN

## 2015-07-25 MED ORDER — DOXEPIN HCL 100 MG PO CAPS: ORAL_CAPSULE | ORAL | Status: DC

## 2015-07-25 MED ORDER — MELOXICAM 7.5 MG PO TABS: 7.5000 mg | ORAL_TABLET | Freq: Every day | ORAL | Status: DC | PRN

## 2015-07-25 NOTE — Progress Notes (Signed)
CC: leg pain HPI: [name] is a 65 y.o. female with a h/o of PAD, HTN, smoking who presents with 1 month h/o unilateral left sided lower leg swelling and pain.  See problem based charting for HPI.  Past Medical History  Diagnosis Date  . Hypothyroidism   . Hypertension   . TIA (transient ischemic attack)   . PUD (peptic ulcer disease)   . HLD (hyperlipidemia)   . Depression   . GERD (gastroesophageal reflux disease)   . Diverticulosis   . PAD (peripheral artery disease) ([place])   . Anal fissure   . Anxiety   . Chronic headaches   . Cataract   . Glaucoma   . Allergy    Current Outpatient Rx  Name  Route  Sig  Dispense  Refill  . acetaminophen (TYLENOL) 500 MG tablet   Oral   Take 2 tablets (1,000 mg total) by mouth every 6 (six) hours as needed for headache.   30 tablet   0   . ARIPiprazole (ABILIFY) 2 MG tablet   Oral   Take 1 tablet (2 mg total) by mouth daily. For mood control   30 tablet   0   . clopidogrel (PLAVIX) 75 MG tablet   Oral   Take 1 tablet (75 mg total) by mouth daily. For Peripheral Arterial disease   1 tablet   0   . cyclobenzaprine (FLEXERIL) 10 MG tablet   Oral   Take 1 tablet (10 mg total) by mouth every 8 (eight) hours as needed for muscle spasms.   60 tablet   6   . doxepin (SINEQUAN) 100 MG capsule      TAKE 1 CAPSULE BY MOUTH AT BEDTIME.   30 capsule   0   . hydrOXYzine (ATARAX/VISTARIL) 25 MG tablet   Oral   Take 1 tablet (25 mg total) by mouth every 6 (six) hours as needed for anxiety (sleep).   60 tablet   0   . levothyroxine (SYNTHROID, LEVOTHROID) 112 MCG tablet      TAKE 1 TABLET (112 MCG TOTAL) BY MOUTH DAILY BEFORE BREAKFAST.   90 tablet   0   . lisinopril (PRINIVIL,ZESTRIL) 40 MG tablet      TAKE 1 TABLET BY MOUTH ONCE A DAY   90 tablet   0   . meloxicam (MOBIC) 7.5 MG tablet   Oral   Take 1 tablet (7.5 mg total) by mouth daily as needed for pain.   14 tablet   0   . nicotine polacrilex  (NICORETTE) 2 MG gum   Oral   Take 1 each (2 mg total) by mouth as needed for smoking cessation.   100 tablet   0   . pantoprazole (PROTONIX) 40 MG tablet   Oral   Take 1 tablet (40 mg total) by mouth 2 (two) times daily. For acid reflux   180 tablet   3   . sertraline (ZOLOFT) 100 MG tablet      TAKE 1 TABLET (100 MG TOTAL) BY MOUTH DAILY.   90 tablet   1   . sertraline (ZOLOFT) 50 MG tablet   Oral   Take 3 tablets (150 mg total) by mouth daily. For depression   90 tablet   0   . traZODone (DESYREL) 100 MG tablet   Oral   Take 1 tablet (100 mg total) by mouth at bedtime. For sleep   30 tablet   0    Review  of Systems: A complete ROS was negative except as per HPI.  Physical Exam: Filed Vitals:   07/25/15 1527 07/25/15 1529  BP:  136/80  Pulse:  97  Temp: 97.7 F (36.5 C)   TempSrc: Oral   Height: 5\' 1"  (1.549 m)   Weight: 220 lb 3.2 oz (99.882 kg)   SpO2:  99%   General appearance: alert, cooperative, appears stated age and moderately obese Head: Normocephalic, without obvious abnormality, atraumatic Lungs: clear to auscultation bilaterally and breathing mildly labored, O2 sats wnl Heart: regular rate and rhythm, S1, S2 normal, no murmur, click, rub or gallop Abdomen: soft, non-tender; bowel sounds normal; no masses,  no organomegaly Extremities: extremities normal, atraumatic, no cyanosis or edema, on the right; left leg is swollen to mid calf with non-pitting edema, exquisitely tender over the foot but ROM is intact w/o crepitus.  Assessment & Plan:  See encounters tab for problem based medical decision making. Patient seen with Dr. Daryll Drown  Signed: Holley Raring, MD 07/25/2015, 4:41 PM  Pager: [phone]

## 2015-07-25 NOTE — Assessment & Plan Note (Addendum)
Pt reports increased pain and swelling of the LLE x 1 month, incrementally worse not relieved by Mobic. She notes that this pain is different from gout pain and the OA knee pain she has had in the past. Pain is localized to the foot but swelling extends to the knee. Right leg is non-tender and not swollen, but is firm. Smoker, obese, not very mobile, denies recent surgery or prolonged immobilization. DDx is high for DVT w/ [place] of 2. Also includes cellulitis, myositis, MSK injury, and gout. Will evaluate after Venous US to r/o clot. F/u 1-2 days.

## 2015-07-25 NOTE — Patient Instructions (Signed)
We are concerned for a blood clot in your left leg. We have scheduled an ultrasound to investigate this possibility. If your ultrasound does not show a blood clot then we will see you back in the clinic to evaluate other possible causes of your pain and swelling.  If you develop shortness of breath or difficulty breathing, go to the emergency department as this may be a sign of pulmonary embolism which can result from blood clots in your legs.

## 2015-07-26 ENCOUNTER — Ambulatory Visit (HOSPITAL_COMMUNITY): Payer: Commercial Managed Care - HMO

## 2015-07-26 ENCOUNTER — Ambulatory Visit (HOSPITAL_COMMUNITY)
Admission: RE | Admit: 2015-07-26 | Discharge: 2015-07-26 | Disposition: A | Discharge status: Home / Self Care | Payer: Commercial Managed Care - HMO | Source: Ambulatory Visit | Attending: Internal Medicine | Admitting: Internal Medicine

## 2015-07-26 ENCOUNTER — Other Ambulatory Visit (INDEPENDENT_AMBULATORY_CARE_PROVIDER_SITE_OTHER): Payer: Commercial Managed Care - HMO

## 2015-07-26 DIAGNOSIS — M7989 Other specified soft tissue disorders: Secondary | ICD-10-CM | POA: Insufficient documentation

## 2015-07-26 DIAGNOSIS — Z8673 Personal history of transient ischemic attack (TIA), and cerebral infarction without residual deficits: Secondary | ICD-10-CM | POA: Insufficient documentation

## 2015-07-26 DIAGNOSIS — K219 Gastro-esophageal reflux disease without esophagitis: Secondary | ICD-10-CM | POA: Insufficient documentation

## 2015-07-26 DIAGNOSIS — M79662 Pain in left lower leg: Secondary | ICD-10-CM

## 2015-07-26 DIAGNOSIS — E785 Hyperlipidemia, unspecified: Secondary | ICD-10-CM | POA: Insufficient documentation

## 2015-07-26 DIAGNOSIS — F329 Major depressive disorder, single episode, unspecified: Secondary | ICD-10-CM | POA: Insufficient documentation

## 2015-07-26 DIAGNOSIS — M79605 Pain in left leg: Secondary | ICD-10-CM | POA: Diagnosis not present

## 2015-07-26 DIAGNOSIS — I10 Essential (primary) hypertension: Secondary | ICD-10-CM | POA: Insufficient documentation

## 2015-07-26 DIAGNOSIS — E039 Hypothyroidism, unspecified: Secondary | ICD-10-CM

## 2015-07-26 DIAGNOSIS — F419 Anxiety disorder, unspecified: Secondary | ICD-10-CM | POA: Diagnosis not present

## 2015-07-26 LAB — BASIC METABOLIC PANEL
Anion gap: 9 (ref 5–15)
BUN: 12 mg/dL (ref 6–20)
CHLORIDE: 100 mmol/L — AB (ref 101–111)
CO2: 28 mmol/L (ref 22–32)
CREATININE: 1.26 mg/dL — AB (ref 0.44–1.00)
Calcium: 9.3 mg/dL (ref 8.9–10.3)
GFR calc Af Amer: 50 mL/min — ABNORMAL LOW (ref >60)
GFR, EST NON AFRICAN AMERICAN: 43 mL/min — AB (ref >60)
Glucose, Bld: 129 mg/dL — ABNORMAL HIGH (ref 65–99)
Potassium: 4 mmol/L (ref 3.5–5.1)
SODIUM: 137 mmol/L (ref 135–145)

## 2015-07-26 NOTE — Progress Notes (Signed)
*  Preliminary Results* Left lower extremity venous duplex completed. Left lower extremity is negative for deep vein thrombosis. There is no evidence of left Baker's cyst.  07/26/2015 2:32 PM  Maudry Mayhew, RVT, RDCS, RDMS

## 2015-07-27 LAB — CBC
HEMATOCRIT: 38.1 % (ref 34.0–46.6)
HEMOGLOBIN: 12.3 g/dL (ref 11.1–15.9)
MCH: 29.8 pg (ref 26.6–33.0)
MCHC: 32.3 g/dL (ref 31.5–35.7)
MCV: 92 fL (ref 79–97)
Platelets: 204 10*3/uL (ref 150–379)
RBC: 4.13 x10E6/uL (ref 3.77–5.28)
RDW: 15.3 % (ref 12.3–15.4)
WBC: 5.6 10*3/uL (ref 3.4–10.8)

## 2015-07-27 LAB — CK: Total CK: 197 U/L — ABNORMAL HIGH (ref 24–173)

## 2015-07-27 LAB — URIC ACID: Uric Acid: 8.7 mg/dL — ABNORMAL HIGH (ref 2.5–7.1)

## 2015-07-27 NOTE — Addendum Note (Signed)
Addended by: Orson Gear on: 07/27/2015 10:19 AM   Modules accepted: Orders

## 2015-07-27 NOTE — Addendum Note (Signed)
Addended by: Holley Raring A on: 07/27/2015 10:17 AM   Modules accepted: Orders

## 2015-07-27 NOTE — Assessment & Plan Note (Signed)
Concerned for hypothyroid myxedema and myopathy. Will evaluate with TSH.

## 2015-07-27 NOTE — Addendum Note (Signed)
Addended by: Holley Raring A on: 07/27/2015 10:13 AM   Modules accepted: Orders

## 2015-07-27 NOTE — Progress Notes (Signed)
Internal Medicine Clinic Attending  I saw and evaluated the patient.  I personally confirmed the key portions of the history and exam documented by Dr. Strelow and I reviewed pertinent patient test results.  The assessment, diagnosis, and plan were formulated together and I agree with the documentation in the resident's note. 

## 2015-07-30 LAB — SPECIMEN STATUS REPORT

## 2015-07-30 LAB — TSH: TSH: 1.09 u[IU]/mL (ref 0.450–4.500)

## 2015-07-31 ENCOUNTER — Telehealth: Payer: Self-pay | Admitting: Internal Medicine

## 2015-07-31 NOTE — Telephone Encounter (Signed)
i have called and verified scripts with pharm in [place]

## 2015-07-31 NOTE — Telephone Encounter (Signed)
cyclobenzaprine (FLEXERIL) 10 MG tablet [facility] states they dont have this

## 2015-08-13 ENCOUNTER — Other Ambulatory Visit: Payer: Self-pay

## 2015-08-13 NOTE — Telephone Encounter (Signed)
Requesting mobic to be filled.

## 2015-08-15 MED ORDER — MELOXICAM 7.5 MG PO TABS: 7.5000 mg | ORAL_TABLET | Freq: Every day | ORAL | 0 refills | Status: DC | PRN

## 2015-08-16 MED ORDER — MELOXICAM 7.5 MG PO TABS: 7.5000 mg | ORAL_TABLET | Freq: Two times a day (BID) | ORAL | 0 refills | Status: DC

## 2015-08-16 NOTE — Telephone Encounter (Signed)
  Reason for call:   I placed an outgoing call to Ms. Baltazar Apo at 4:30 pm regarding her leg swelling.   Assessment/ Plan:   Pt reports that she is out of her Mobic, but this has helped with her symptoms. She notes that she was taking it at night when the pain was worst. She denies any symptoms in her contralateral leg.  She has an appt with me in 1 wk and I have refilled her Mobic 7.5mg  BID #14 to get her to the next appt with me.  I have also relayed the results of her recent CK lab test which was mildly elevated. I will plan to reevaluate for any signs of myositis at her next appt.  As always, pt is advised that if symptoms worsen or new symptoms arise, they should go to an urgent care facility or to to ER for further evaluation.   Holley Raring, MD   08/16/2015, 4:34 PM

## 2015-08-22 ENCOUNTER — Telehealth: Payer: Self-pay | Admitting: Internal Medicine

## 2015-08-22 NOTE — Telephone Encounter (Signed)
APT. REMINDER CALL, LMTCB °

## 2015-08-23 ENCOUNTER — Encounter: Payer: Self-pay | Admitting: Internal Medicine

## 2015-08-23 ENCOUNTER — Ambulatory Visit (INDEPENDENT_AMBULATORY_CARE_PROVIDER_SITE_OTHER): Payer: Commercial Managed Care - HMO | Admitting: Internal Medicine

## 2015-08-23 ENCOUNTER — Ambulatory Visit (HOSPITAL_COMMUNITY)
Admission: RE | Admit: 2015-08-23 | Discharge: 2015-08-23 | Disposition: A | Discharge status: Home / Self Care | Payer: Commercial Managed Care - HMO | Source: Ambulatory Visit | Attending: Oncology | Admitting: Oncology

## 2015-08-23 VITALS — BP 143/83 | HR 94 | Temp 98.1°F | Ht 61.0 in | Wt 226.3 lb

## 2015-08-23 DIAGNOSIS — M79605 Pain in left leg: Secondary | ICD-10-CM | POA: Insufficient documentation

## 2015-08-23 DIAGNOSIS — M79662 Pain in left lower leg: Secondary | ICD-10-CM

## 2015-08-23 DIAGNOSIS — M79672 Pain in left foot: Secondary | ICD-10-CM | POA: Diagnosis not present

## 2015-08-23 DIAGNOSIS — M7989 Other specified soft tissue disorders: Secondary | ICD-10-CM | POA: Diagnosis not present

## 2015-08-23 DIAGNOSIS — M25572 Pain in left ankle and joints of left foot: Secondary | ICD-10-CM | POA: Diagnosis not present

## 2015-08-23 MED ORDER — CLOPIDOGREL BISULFATE 75 MG PO TABS: 75.0000 mg | ORAL_TABLET | Freq: Every day | ORAL | 0 refills | Status: DC

## 2015-08-23 MED ORDER — DOXEPIN HCL 100 MG PO CAPS: ORAL_CAPSULE | ORAL | 0 refills | Status: DC

## 2015-08-23 MED ORDER — CYCLOBENZAPRINE HCL 10 MG PO TABS: 10.0000 mg | ORAL_TABLET | Freq: Three times a day (TID) | ORAL | 6 refills | Status: DC | PRN

## 2015-08-23 MED ORDER — MELOXICAM 7.5 MG PO TABS: 7.5000 mg | ORAL_TABLET | Freq: Every day | ORAL | 1 refills | Status: DC | PRN

## 2015-08-23 NOTE — Progress Notes (Signed)
   CC: left leg pain and swelling HPI: [name] is a 65 y.o. female with a h/o of HTN, tobacco abuse, hypothyroidism, DJD, PAD who presents for follow up of left leg pain and swelling.  Please see Problem-based charting for HPI and the status of patient's chronic medical conditions.  Past Medical History:  Diagnosis Date  . Allergy   . Anal fissure   . Anxiety   . Cataract   . Chronic headaches   . Depression   . Diverticulosis   . GERD (gastroesophageal reflux disease)   . Glaucoma   . HLD (hyperlipidemia)   . Hypertension   . Hypothyroidism   . PAD (peripheral artery disease) ([place])   . PUD (peptic ulcer disease)   . TIA (transient ischemic attack)    Review of Systems: ROS in HPI. Otherwise: Review of Systems  Constitutional: Negative for chills, diaphoresis, fever and weight loss.  Respiratory: Positive for cough (dry chronic). Negative for hemoptysis, sputum production, shortness of breath and wheezing.   Cardiovascular: Negative for chest pain and palpitations.  Gastrointestinal: Positive for constipation. Negative for abdominal pain, diarrhea, heartburn, nausea and vomiting.  Genitourinary: Negative for dysuria, frequency, hematuria and urgency.  Musculoskeletal: Positive for myalgias (left foot/leg). Negative for joint pain.  Skin: Negative for itching and rash.  Neurological: Negative for weakness.    Physical Exam: Vitals:   08/23/15 1509  BP: (!) 143/83  Pulse: 94  Temp: 98.1 F (36.7 C)  TempSrc: Oral  SpO2: 100%  Weight: 226 lb 4.8 oz (102.6 kg)  Height: '5\' 1"'$  (1.549 m)   Physical Exam  Constitutional: She appears well-developed. She is cooperative. No distress.  HENT:  Nose: Nose normal.  Mouth/Throat: Oropharynx is clear and moist. No oropharyngeal exudate.  Cardiovascular: Normal rate, regular rhythm, normal heart sounds and normal pulses.  Exam reveals no gallop.   No murmur heard. Pulmonary/Chest: Effort normal and breath sounds  normal. No respiratory distress. She has no wheezes. She has no rhonchi. She has no rales. Breasts are symmetrical.  Abdominal: Soft. Bowel sounds are normal. There is no tenderness.  Musculoskeletal: She exhibits edema (firm, woody edema of the left leg extending to the calf, non-pitting, TTP, limited ROM 2/2 pain).  Skin: Skin is warm and dry. Rash (hyperpigmented skin changes overlying the MTP joints on the left foot, hyperpigmented lenticular changes over the bilateral pretibial shin) noted.    Assessment & Plan:  See encounters tab for problem based medical decision making. Patient seen with Dr. Daryll Drown  Pain and swelling of left lower leg Patient continues to c/o left leg and foot pain. Firm woody swelling. Relieved some by Mobic and cyclobenzaprine. Worse at night. Chronic and worsening for 5 months. Neg [place] for DVT, normal TSH, mildly elevated CK and Uric acid. No obvious joint effusion.  Concern for vasculitis. XR of foot, ankle, and leg today. ANA/ANCA, ESR/CRP today for eval of possible vasculitis. Punch biopsy x 2 for path. Will continue Mobic for symptomatic relief, checking renal function to ensure no SCr bump. F/U 1 month.   Signed: Holley Raring, MD 08/23/2015, 4:56 PM  Pager: [phone]

## 2015-08-23 NOTE — Patient Instructions (Signed)
In order to evaluate what is causing your left leg pain, we will do several blood tests today to look for inflammatory disease. We will also take some x-rays in order to make sure there is no changes to the bone. In addition to this the best test to look for inflammation will be a biopsy. We have done this year in the clinic today, and we will should have results back in 1-2 weeks.  In order to treat her pain in the time being, we will continue the meloxicam 7.5 mg tablets. Please do not take more than 2 of these tablets a day. You can continue to take your muscle relaxer if this helps. We will schedule follow-up with me in 1 month, and I will give you a call with the results of your tests as they become available.

## 2015-08-23 NOTE — Assessment & Plan Note (Signed)
Patient continues to c/o left leg and foot pain. Firm woody swelling. Relieved some by Mobic and cyclobenzaprine. Worse at night. Chronic and worsening for 5 months. Neg [place] for DVT, normal TSH, mildly elevated CK and Uric acid. No obvious joint effusion.  Concern for vasculitis. XR of foot, ankle, and leg today. ANA/ANCA, ESR/CRP today for eval of possible vasculitis. Punch biopsy x 2 for path. Will continue Mobic for symptomatic relief, checking renal function to ensure no SCr bump. F/U 1 month.

## 2015-08-24 DIAGNOSIS — I872 Venous insufficiency (chronic) (peripheral): Secondary | ICD-10-CM | POA: Diagnosis not present

## 2015-08-24 LAB — C-REACTIVE PROTEIN: CRP: 4.6 mg/L (ref 0.0–4.9)

## 2015-08-24 LAB — BMP8+ANION GAP
Anion Gap: 20 mmol/L — ABNORMAL HIGH (ref 10.0–18.0)
BUN/Creatinine Ratio: 9 — ABNORMAL LOW (ref 12–28)
BUN: 12 mg/dL (ref 8–27)
CALCIUM: 9.4 mg/dL (ref 8.7–10.3)
CO2: 22 mmol/L (ref 18–29)
CREATININE: 1.38 mg/dL — AB (ref 0.57–1.00)
Chloride: 102 mmol/L (ref 96–106)
GFR calc non Af Amer: 40 mL/min/{1.73_m2} — ABNORMAL LOW (ref >59)
GFR, EST AFRICAN AMERICAN: 46 mL/min/{1.73_m2} — AB (ref >59)
GLUCOSE: 97 mg/dL (ref 65–99)
POTASSIUM: 4.5 mmol/L (ref 3.5–5.2)
SODIUM: 144 mmol/L (ref 134–144)

## 2015-08-24 LAB — FANA STAINING PATTERNS

## 2015-08-24 LAB — MPO/PR-3 (ANCA) ANTIBODIES

## 2015-08-24 LAB — SEDIMENTATION RATE: Sed Rate: 22 mm/hr (ref 0–40)

## 2015-08-24 LAB — ANTINUCLEAR ANTIBODIES, IFA: ANA Titer 1: POSITIVE — AB

## 2015-08-27 NOTE — Progress Notes (Signed)
Internal Medicine Clinic Attending  I saw and evaluated the patient.  I personally confirmed the key portions of the history and exam documented by Dr. Strelow and I reviewed pertinent patient test results.  The assessment, diagnosis, and plan were formulated together and I agree with the documentation in the resident's note. 

## 2015-08-28 ENCOUNTER — Other Ambulatory Visit: Payer: Self-pay | Admitting: Internal Medicine

## 2015-08-28 ENCOUNTER — Telehealth: Payer: Self-pay | Admitting: Internal Medicine

## 2015-08-28 DIAGNOSIS — M79662 Pain in left lower leg: Secondary | ICD-10-CM

## 2015-08-28 DIAGNOSIS — Z114 Encounter for screening for human immunodeficiency virus [HIV]: Secondary | ICD-10-CM

## 2015-08-28 DIAGNOSIS — M7989 Other specified soft tissue disorders: Principal | ICD-10-CM

## 2015-08-28 MED ORDER — TRAMADOL HCL 50 MG PO TABS: 50.0000 mg | ORAL_TABLET | Freq: Four times a day (QID) | ORAL | 0 refills | Status: DC | PRN

## 2015-08-28 NOTE — Telephone Encounter (Signed)
Just spoke with patient.  She states that she will be here this Thursday 08-30-15 after 1 pm to have lab work done.  I have scheduled her appt for next month Sep. 14, 2017 at 3:45 pm.  Pt asked to be given an appt slip when she comes to see [place] later on this week.

## 2015-08-28 NOTE — Telephone Encounter (Signed)
  Reason for call:   I placed an outgoing call to [name] at 1:15 PM regarding her test results.   Assessment/ Plan:   Patient answered the phone. She noted that she was doing roughly the same in terms of her lower extremity pain and swelling.  I relayed the test results from her most recent office visit including her positive ANA with a 1:80 titer. As well as her nonspecific stasis dermatitis biopsy results.  We discussed a plan to send more lab work for specific antibodies, and she noted that she will be able to come by next week for that blood work.  We also discussed the slight rise in her serum creatinine. I noted that I'm uncomfortable continuing to give her Mobic in this setting. We discussed prescribing her tramadol for pain. This medication has been sent to the pharmacy. Tramadol 50mg  q6h PRN #30 R-0, printed/signed and given to Triage for pt to pickup from the clinic.  As always, pt is advised that if symptoms worsen or new symptoms arise, they should go to an urgent care facility or to to ER for further evaluation.   Holley Raring, MD   08/28/2015, 1:15 PM

## 2015-08-30 ENCOUNTER — Other Ambulatory Visit: Payer: Self-pay

## 2015-09-04 ENCOUNTER — Other Ambulatory Visit: Payer: Self-pay | Admitting: Internal Medicine

## 2015-09-04 DIAGNOSIS — M7989 Other specified soft tissue disorders: Principal | ICD-10-CM

## 2015-09-04 DIAGNOSIS — M79662 Pain in left lower leg: Secondary | ICD-10-CM

## 2015-09-06 ENCOUNTER — Other Ambulatory Visit: Payer: Self-pay | Admitting: *Deleted

## 2015-09-06 ENCOUNTER — Other Ambulatory Visit (INDEPENDENT_AMBULATORY_CARE_PROVIDER_SITE_OTHER): Payer: Commercial Managed Care - HMO

## 2015-09-06 ENCOUNTER — Other Ambulatory Visit: Payer: Self-pay | Admitting: Internal Medicine

## 2015-09-06 DIAGNOSIS — M79662 Pain in left lower leg: Secondary | ICD-10-CM | POA: Diagnosis not present

## 2015-09-06 DIAGNOSIS — Z114 Encounter for screening for human immunodeficiency virus [HIV]: Secondary | ICD-10-CM | POA: Diagnosis not present

## 2015-09-06 DIAGNOSIS — M79605 Pain in left leg: Secondary | ICD-10-CM | POA: Diagnosis not present

## 2015-09-06 DIAGNOSIS — M7989 Other specified soft tissue disorders: Secondary | ICD-10-CM

## 2015-09-06 NOTE — Telephone Encounter (Signed)
Pt here for lab work and pick up rx for tramadol.  States she spoke with her pcp and was informed rx would be ready for pick up.  Unable to locate rx in [facility].  Will forward request to attending for review.[name]8/24/20172:46 PM  Her pharmacy was contacted and they confirmed last refill on 08/31/2015 #30 written by [employer].Marland Kitchen

## 2015-09-06 NOTE — Telephone Encounter (Signed)
Sent to MD in error, upon further review of pt's chart, she picked up rx on 08/18-no additional refills due at this time, pt aware.[name]8/24/20172:52 PM  Phone call complete.[name]8/24/20172:52 PM

## 2015-09-07 LAB — ANTI-SMOOTH MUSCLE ANTIBODY, IGG: SMOOTH MUSCLE AB: 34 U — AB (ref 0–19)

## 2015-09-07 LAB — SJOGREN'S SYNDROME ANTIBODS(SSA + SSB)

## 2015-09-07 LAB — EDI MISCELLANEOUS

## 2015-09-07 LAB — RNP ANTIBODIES: ENA RNP Ab: 0.7 AI (ref 0.0–0.9)

## 2015-09-07 LAB — ANTI-DNA ANTIBODY, DOUBLE-STRANDED: dsDNA Ab: <1 IU/mL (ref 0–9)

## 2015-09-07 LAB — ANTI-SCLERODERMA ANTIBODY: Scleroderma SCL-70: <0.2 AI (ref 0.0–0.9)

## 2015-09-07 LAB — HIV ANTIBODY (ROUTINE TESTING W REFLEX): HIV SCREEN 4TH GENERATION: NONREACTIVE

## 2015-09-20 ENCOUNTER — Other Ambulatory Visit: Payer: Self-pay

## 2015-09-20 DIAGNOSIS — K219 Gastro-esophageal reflux disease without esophagitis: Secondary | ICD-10-CM

## 2015-09-20 NOTE — Telephone Encounter (Signed)
Pt states tramadol, pantoprazole and doxepin needs PA.

## 2015-09-21 MED ORDER — DOXEPIN HCL 100 MG PO CAPS: ORAL_CAPSULE | ORAL | 2 refills | Status: DC

## 2015-09-21 MED ORDER — PANTOPRAZOLE SODIUM 40 MG PO TBEC: 40.0000 mg | DELAYED_RELEASE_TABLET | Freq: Two times a day (BID) | ORAL | 0 refills | Status: DC

## 2015-09-21 MED ORDER — TRAMADOL HCL 50 MG PO TABS: 50.0000 mg | ORAL_TABLET | Freq: Four times a day (QID) | ORAL | 0 refills | Status: DC | PRN

## 2015-09-21 NOTE — Telephone Encounter (Signed)
Tramadol rx called to [facility] in [place].

## 2015-09-26 ENCOUNTER — Telehealth: Payer: Self-pay | Admitting: Internal Medicine

## 2015-09-26 NOTE — Telephone Encounter (Signed)
APT. REMINDER CALL, LMTCB °

## 2015-09-27 ENCOUNTER — Encounter: Payer: Self-pay | Admitting: Internal Medicine

## 2015-09-27 ENCOUNTER — Ambulatory Visit (INDEPENDENT_AMBULATORY_CARE_PROVIDER_SITE_OTHER): Payer: Commercial Managed Care - HMO | Admitting: Internal Medicine

## 2015-09-27 VITALS — BP 147/77 | HR 97 | Temp 99.0°F | Ht 61.0 in | Wt 225.7 lb

## 2015-09-27 DIAGNOSIS — R21 Rash and other nonspecific skin eruption: Secondary | ICD-10-CM

## 2015-09-27 DIAGNOSIS — M79662 Pain in left lower leg: Secondary | ICD-10-CM | POA: Diagnosis not present

## 2015-09-27 DIAGNOSIS — R1310 Dysphagia, unspecified: Secondary | ICD-10-CM

## 2015-09-27 DIAGNOSIS — M7989 Other specified soft tissue disorders: Secondary | ICD-10-CM

## 2015-09-27 DIAGNOSIS — R238 Other skin changes: Secondary | ICD-10-CM

## 2015-09-27 MED ORDER — PREDNISONE 20 MG PO TABS: 20.0000 mg | ORAL_TABLET | Freq: Every day | ORAL | 0 refills | Status: DC

## 2015-09-27 MED ORDER — HYDROCODONE-ACETAMINOPHEN 5-325 MG PO TABS: 1.0000 | ORAL_TABLET | Freq: Four times a day (QID) | ORAL | 0 refills | Status: DC | PRN

## 2015-09-27 NOTE — Progress Notes (Signed)
CC: Pain and swelling of the left lower extremity HPI: [name] is a 65 y.o. female with a h/o of PAD, OA, gout, GERD, hypertension, hyperlipidemia who presents for continued evaluation and management of pain and swelling of her left lower extremity.  Please see Problem-based charting for HPI and the status of patient's chronic medical conditions.  Past Medical History:  Diagnosis Date  . Allergy   . Anal fissure   . Anxiety   . Cataract   . Chronic headaches   . Depression   . Diverticulosis   . GERD (gastroesophageal reflux disease)   . Glaucoma   . HLD (hyperlipidemia)   . Hypertension   . Hypothyroidism   . PAD (peripheral artery disease) ([place])   . PUD (peptic ulcer disease)   . TIA (transient ischemic attack)     Social Hx: Patient continues to be significantly limited in her daily activities by her current symptoms. She reports that she is currently not working, but she does stay busy by coming to take care of her daughter's children.  Review of Systems: ROS in HPI. Otherwise: Review of Systems  Constitutional: Negative for chills, fever and weight loss.  Respiratory: Negative for cough and shortness of breath.   Cardiovascular: Negative for chest pain and leg swelling.  Gastrointestinal: Positive for heartburn. Negative for abdominal pain, constipation, diarrhea, nausea and vomiting.       Dysphagia Xerostomia  Genitourinary: Negative for dysuria, frequency and urgency.  Musculoskeletal: Positive for joint pain and myalgias.   Physical Exam: Vitals:   09/27/15 1609  BP: (!) 147/77  Pulse: 97  Temp: 99 F (37.2 C)  TempSrc: Oral  SpO2: 100%  Weight: 225 lb 11.2 oz (102.4 kg)  Height: _0  (1.549 m)   Physical Exam Physical Exam  Constitutional: She appears well-developed. She is cooperative. No distress.  HENT:  Nose: Nose normal.  Mouth/Throat: Oropharynx is clear and moist. No oropharyngeal exudate.  Cardiovascular: Normal rate,  regular rhythm, normal heart sounds and normal pulses.  Exam reveals no gallop.   No murmur heard. Pulmonary/Chest: Effort normal and breath sounds normal. No respiratory distress. She has no wheezes. She has no rhonchi. She has no rales. Breasts are symmetrical.  Abdominal: Soft. Bowel sounds are normal. There is no tenderness.  Musculoskeletal: She exhibits edema (firm, woody edema of the left leg extending to the calf, non-pitting, TTP, limited ROM 2/2 pain).  Skin: Skin is warm and dry. Rash (hyperpigmented skin changes overlying the MTP joints on the left foot increased to include dorsum of left foot, mild hyperpigmentation of the right foot, hyperpigmented lenticular changes over the bilateral pretibial shin) noted.   Assessment & Plan:  See encounters tab for problem based medical decision making. Patient seen with Dr. Lynnae January  Dysphagia Pt complains of new onset dysphagia which is different from her previous symptoms of GERD. She notes that these problems with swallowing began 1-2 weeks ago and she has had difficulty with solids and liquids. She denies and regurgitation. She describes this sensation as feeling like things are caught in her throat. She has had prior EGDs which were not remarkable for stricture or diverticulum. She also had a negative barium swallow in 10/2014 for dysphagia of solids (not liquids). She denies any weight loss.  Given her concomitant myalgias and skin changes, along with the positive anti-sm and concern for sjogren with xerostomia, it is possible that this new complaint is related to an auatoimmune symptomology. We will plan  to refer to GI for evaluation and possible repeat barium swallow vs manomatry and continue with referral to Rheumatology for evaluation of her LE symptoms with a concern for possible Sjogren's.  Pain and swelling of left lower leg Pt continues to complain of severe left LE pain and swelling with worsening hyperpigmented skin changes of the  left foot. She notes that her symptoms have only worsened and not improved since her last visit. The tramadol provides minimal temporary relief but is not sufficient to allow her to complete her ADLs on her own. She is currently getting help from her daughter with this. She also notes that the hyperpigmented skin changes are worseing on the left and now encompass the whole left foot to the ankle. She has a bilateral lenticular rash on her shins which is not blanching, non-tender. She also has some minimal darkening of the right foot, but denies any pain or swelling of this extremity. Her two LLE skin punch biopsies were non-revealing, demonstrating on ly venous stasis edema.  Given her positive anti-sm and concern for SS with xerostomia, dysphagia, myalgia and non-specific skin lesion, we will referr to Rheumatology for further evaluation and help with diagnosis. We will also trial a short course of low dose prednisone 40m qD x 2 wk as a therapeutic trial and diagnostic challenge to determine if the etiology is inflammatory or other wise. Of note, her CRP and ESR were wnl on last testing several weeks ago which is not consistent with inflammatory pathologies. Pt also has claudication but reports these symptoms as significantly different and occurring at rest. She was also referred to Vascular Surgery in the Spring of 2017 and was found to have normal ABIs w/o evidence of a cause for her pain. She has a history of OA, but her symptoms demonstrate concerning skin changes and pain out of proportion to exam when attempting small ROM exercises. Gout is still on the differential, which may be causing significant swelling and edematous changes of the left foot, however, this would not explian the bilateral skin findings.  We appreciate Rheumatologies assistance. We will try 279mqD prednisone x 2 wks. Recommend Norco [id number] for severe pain in the meantime. Plan to f/u in 4 weeks.   Signed: BryHolley RaringMD 10/02/2015, 7:14 PM  Pager: [phone]

## 2015-09-27 NOTE — Patient Instructions (Signed)
We will give you a short course of steroids (15mg /day for 2 weeks). We will also prescribe Vicodin for pain.  The nurse will call you with an appointment to see the GI doctor about your difficulty swallowing and the Rheumatology doctor about your pain and skin changes in your left leg.

## 2015-10-02 NOTE — Assessment & Plan Note (Signed)
Pt complains of new onset dysphagia which is different from her previous symptoms of GERD. She notes that these problems with swallowing began 1-2 weeks ago and she has had difficulty with solids and liquids. She denies and regurgitation. She describes this sensation as feeling like things are caught in her throat. She has had prior EGDs which were not remarkable for stricture or diverticulum. She also had a negative barium swallow in 10/2014 for dysphagia of solids (not liquids). She denies any weight loss.  Given her concomitant myalgias and skin changes, along with the positive anti-sm and concern for sjogren with xerostomia, it is possible that this new complaint is related to an auatoimmune symptomology. We will plan to refer to GI for evaluation and possible repeat barium swallow vs manomatry and continue with referral to Rheumatology for evaluation of her LE symptoms with a concern for possible Sjogren's.

## 2015-10-02 NOTE — Assessment & Plan Note (Signed)
Pt continues to complain of severe left LE pain and swelling with worsening hyperpigmented skin changes of the left foot. She notes that her symptoms have only worsened and not improved since her last visit. The tramadol provides minimal temporary relief but is not sufficient to allow her to complete her ADLs on her own. She is currently getting help from her daughter with this. She also notes that the hyperpigmented skin changes are worseing on the left and now encompass the whole left foot to the ankle. She has a bilateral lenticular rash on her shins which is not blanching, non-tender. She also has some minimal darkening of the right foot, but denies any pain or swelling of this extremity. Her two LLE skin punch biopsies were non-revealing, demonstrating on ly venous stasis edema.  Given her positive anti-sm and concern for SS with xerostomia, dysphagia, myalgia and non-specific skin lesion, we will referr to Rheumatology for further evaluation and help with diagnosis. We will also trial a short course of low dose prednisone 15m qD x 2 wk as a therapeutic trial and diagnostic challenge to determine if the etiology is inflammatory or other wise. Of note, her CRP and ESR were wnl on last testing several weeks ago which is not consistent with inflammatory pathologies. Pt also has claudication but reports these symptoms as significantly different and occurring at rest. She was also referred to Vascular Surgery in the Spring of 2017 and was found to have normal ABIs w/o evidence of a cause for her pain. She has a history of OA, but her symptoms demonstrate concerning skin changes and pain out of proportion to exam when attempting small ROM exercises. Gout is still on the differential, which may be causing significant swelling and edematous changes of the left foot, however, this would not explian the bilateral skin findings.  We appreciate Rheumatologies assistance. We will try 264mqD prednisone x 2 wks.  Recommend Norco 5-32582m6h for severe pain in the meantime. Plan to f/u in 4 weeks.

## 2015-10-03 NOTE — Progress Notes (Signed)
Internal Medicine Clinic Attending  I saw and evaluated the patient.  I personally confirmed the key portions of the history and exam documented by Dr. Strelow and I reviewed pertinent patient test results.  The assessment, diagnosis, and plan were formulated together and I agree with the documentation in the resident's note. 

## 2015-10-17 ENCOUNTER — Other Ambulatory Visit: Payer: Self-pay | Admitting: Internal Medicine

## 2015-10-17 ENCOUNTER — Encounter: Payer: Self-pay | Admitting: *Deleted

## 2015-10-17 MED ORDER — ARIPIPRAZOLE 2 MG PO TABS: 2.0000 mg | ORAL_TABLET | Freq: Every day | ORAL | 0 refills | Status: DC

## 2015-10-17 MED ORDER — LEVOTHYROXINE SODIUM 112 MCG PO TABS: ORAL_TABLET | ORAL | 0 refills | Status: DC

## 2015-10-17 MED ORDER — CYCLOBENZAPRINE HCL 10 MG PO TABS: 10.0000 mg | ORAL_TABLET | Freq: Three times a day (TID) | ORAL | 6 refills | Status: DC | PRN

## 2015-10-17 MED ORDER — DOXEPIN HCL 100 MG PO CAPS: ORAL_CAPSULE | ORAL | 0 refills | Status: DC

## 2015-10-17 NOTE — Telephone Encounter (Signed)
Pt needs a refill on these medications cyclobenzaprine (FLEXERIL) 10 MG tablet clopidogrel (PLAVIX) 75 MG tablet levothyroxine (SYNTHROID, LEVOTHROID) 112 MCG tabletdoxepin (SINEQUAN) 100 MG capsule HYDROcodone-acetaminophen (NORCO/VICODIN) 5-325 MG tablet   Pt requesting to start her prednisone again if possible

## 2015-10-17 NOTE — Telephone Encounter (Signed)
Can we check on the status of pt's referral to rheumatology from visit on 09/27/15?  I can refill Levothyroxine, Aripiprazole, doxepin, and cyclobenzaprine, but want to call pt regarding Norco and prednisone. Will try to speak with the pt tomorrow via phone.

## 2015-10-18 ENCOUNTER — Telehealth: Payer: Self-pay | Admitting: Internal Medicine

## 2015-10-18 MED ORDER — HYDROCODONE-ACETAMINOPHEN 5-325 MG PO TABS: 1.0000 | ORAL_TABLET | ORAL | 0 refills | Status: DC | PRN

## 2015-10-18 NOTE — Telephone Encounter (Signed)
Per EP[facility], records were faxed to [facility] on 10/05/15; no appt yet per [facility], referral coordinator.

## 2015-10-18 NOTE — Telephone Encounter (Signed)
Left message for rtc

## 2015-10-18 NOTE — Telephone Encounter (Signed)
  Reason for call:   I placed an outgoing call to [name] at 1:25 PM regarding her LLE pain and Vicodin Rx.   Assessment/ Plan:   Pt reports that she is only able to control the pain with 2x vicodin 5-325mg . Even still, she notes that the medication wears off after ~3 hours.  She reports that the prednisone helped with the discoloration of the skin and the swelling, but did not have a significant impact on her pain.  She received several messages from the Rheumatologist to set up an appointment and reports that she will call today in order to be seen for evaluation. I will defer refilling prednisone until she is able to see Rheum as it is not having a significant impact on her pain.  I will also refill another temporary script for Vicodin 5-325 1 pill q4h #180. I will leave this with triage for pt to pick up.  As always, pt is advised that if symptoms worsen or new symptoms arise, they should go to an urgent care facility or to to ER for further evaluation.   Holley Raring, MD   10/18/2015, 1:28 PM

## 2015-10-18 NOTE — Telephone Encounter (Signed)
  Reason for call:   I placed an outgoing call to Ms. Baltazar Apo at 9:45 AM regarding her LE pain and swelling. She recently finished a two week prednisone therapeutic trial and I wanted to follow up regarding the efficacy of this. She was also started on Vicodin for pain control and wanted to follow up on her pain level and narcotic requirement currently. Would also like to f/u on pt's referrals to GI and Rheum.   Assessment/ Plan:   VM left asking pt to return call.  Will attempt to call again later if no response today.   Holley Raring, MD   10/18/2015, 9:46 AM

## 2015-10-18 NOTE — Telephone Encounter (Signed)
Pt is returning call to dr Gay Filler

## 2015-10-22 ENCOUNTER — Ambulatory Visit: Payer: Self-pay | Admitting: Internal Medicine

## 2015-11-13 ENCOUNTER — Other Ambulatory Visit: Payer: Self-pay | Admitting: Internal Medicine

## 2015-11-13 NOTE — Telephone Encounter (Signed)
appt  12/13/2015 with pcp

## 2015-11-15 ENCOUNTER — Other Ambulatory Visit: Payer: Self-pay | Admitting: *Deleted

## 2015-11-19 MED ORDER — CLOPIDOGREL BISULFATE 75 MG PO TABS: 75.0000 mg | ORAL_TABLET | Freq: Every day | ORAL | 0 refills | Status: DC

## 2015-11-19 NOTE — Addendum Note (Signed)
Addended by: Hulan Fray on: 11/19/2015 06:01 PM   Modules accepted: Orders

## 2015-12-13 ENCOUNTER — Encounter: Payer: Self-pay | Admitting: Internal Medicine

## 2015-12-13 ENCOUNTER — Telehealth: Payer: Self-pay | Admitting: Internal Medicine

## 2015-12-13 NOTE — Telephone Encounter (Signed)
APT. REMINDER CALL, LMTCB °

## 2015-12-17 ENCOUNTER — Telehealth: Payer: Self-pay

## 2015-12-17 NOTE — Telephone Encounter (Signed)
HYDROcodone-acetaminophen (NORCO/VICODIN) 5-325 MG tablet,  furosemide (LASIX) 20 MG tablet, sertraline (ZOLOFT) 100 MG tablet  sertraline (ZOLOFT) 100 MG tablet, refill request.

## 2015-12-20 MED ORDER — SERTRALINE HCL 100 MG PO TABS: ORAL_TABLET | ORAL | 1 refills | Status: DC

## 2015-12-20 MED ORDER — FUROSEMIDE 20 MG PO TABS: 20.0000 mg | ORAL_TABLET | Freq: Every day | ORAL | 2 refills | Status: DC

## 2015-12-20 NOTE — Telephone Encounter (Signed)
Called pt - stated she has transferred her care. She stated she wanted refills until she is seen by her new doctor on the 18th. Informed hydrocodone was not refilled - need an appt per Dr Gay Filler; voiced ok. Informed Lasix and Zoloft were refilled.

## 2015-12-20 NOTE — Telephone Encounter (Signed)
Sertraline and furosemide refilled. Norco not continued until pt is able to return for appt.  Please schedule at my next available for UDS and check-up.

## 2016-02-11 ENCOUNTER — Other Ambulatory Visit: Payer: Self-pay | Admitting: Internal Medicine

## 2016-02-13 NOTE — Telephone Encounter (Signed)
[place] from [place] requesting doxepin (SINEQUAN) 100 MG capsule to be filled.

## 2016-02-29 ENCOUNTER — Telehealth: Payer: Self-pay

## 2016-02-29 NOTE — Telephone Encounter (Signed)
[place] from [place] requesting [place] for cyclobenzaprine (FLEXERIL) 10 MG tablet.

## 2016-02-29 NOTE — Telephone Encounter (Signed)
Please refer to telephone note and permanent comments.  States patient has transferred care and PCP has been removed.

## 2016-03-12 ENCOUNTER — Other Ambulatory Visit: Payer: Self-pay | Admitting: Internal Medicine

## 2016-03-18 ENCOUNTER — Ambulatory Visit: Payer: Self-pay

## 2016-03-19 ENCOUNTER — Telehealth: Payer: Self-pay | Admitting: General Practice

## 2016-03-19 NOTE — Telephone Encounter (Signed)
APT. REMINDER CALL, LMTCB °

## 2016-03-20 ENCOUNTER — Encounter: Payer: Self-pay | Admitting: Internal Medicine

## 2016-03-20 ENCOUNTER — Ambulatory Visit: Payer: Self-pay

## 2016-03-25 ENCOUNTER — Encounter (HOSPITAL_COMMUNITY): Payer: Self-pay

## 2016-03-25 ENCOUNTER — Ambulatory Visit: Payer: Self-pay | Admitting: Family

## 2016-04-05 ENCOUNTER — Other Ambulatory Visit: Payer: Self-pay | Admitting: Internal Medicine

## 2016-04-09 ENCOUNTER — Encounter: Payer: Self-pay | Admitting: Family

## 2016-04-15 DIAGNOSIS — E039 Hypothyroidism, unspecified: Secondary | ICD-10-CM | POA: Diagnosis not present

## 2016-04-15 DIAGNOSIS — I739 Peripheral vascular disease, unspecified: Secondary | ICD-10-CM | POA: Diagnosis not present

## 2016-04-15 DIAGNOSIS — I519 Heart disease, unspecified: Secondary | ICD-10-CM | POA: Diagnosis not present

## 2016-04-15 DIAGNOSIS — I10 Essential (primary) hypertension: Secondary | ICD-10-CM | POA: Diagnosis not present

## 2016-04-21 ENCOUNTER — Telehealth: Payer: Self-pay

## 2016-04-21 NOTE — Telephone Encounter (Signed)
rec'd vm. from pt. with request for "refill on her blood thinner", since she has moved out of the area. Stated in message, that she had been out of her blood thinner about a month, and needed a one month supply, until she gets established with a PCP.   Attempted to return call to pt.  Unable to reach her at this time.  Left voice message that since we have not seen her in our office in over a year, we will not be able to refill her blood thinner.  Encouraged her to go to an Urgent Care or ER in the local area she is in, since she doesn't have a PCP.  Encouraged to call the office if any questions.

## 2016-04-22 ENCOUNTER — Ambulatory Visit (HOSPITAL_COMMUNITY): Payer: Medicare HMO

## 2016-04-22 ENCOUNTER — Ambulatory Visit: Payer: Medicare HMO | Admitting: Family

## 2016-05-06 ENCOUNTER — Other Ambulatory Visit: Payer: Self-pay | Admitting: Internal Medicine

## 2016-05-13 ENCOUNTER — Other Ambulatory Visit: Payer: Self-pay | Admitting: Internal Medicine

## 2016-05-15 DIAGNOSIS — G8929 Other chronic pain: Secondary | ICD-10-CM | POA: Diagnosis not present

## 2016-05-15 DIAGNOSIS — I10 Essential (primary) hypertension: Secondary | ICD-10-CM | POA: Diagnosis not present

## 2016-05-15 DIAGNOSIS — I739 Peripheral vascular disease, unspecified: Secondary | ICD-10-CM | POA: Diagnosis not present

## 2016-05-15 DIAGNOSIS — F329 Major depressive disorder, single episode, unspecified: Secondary | ICD-10-CM | POA: Diagnosis not present

## 2016-05-27 ENCOUNTER — Other Ambulatory Visit: Payer: Self-pay | Admitting: Internal Medicine

## 2016-08-01 ENCOUNTER — Ambulatory Visit: Payer: Self-pay | Admitting: Family Medicine

## 2016-08-25 ENCOUNTER — Emergency Department (HOSPITAL_COMMUNITY): Payer: Medicare HMO

## 2016-08-25 ENCOUNTER — Emergency Department (HOSPITAL_COMMUNITY)
Admission: EM | Admit: 2016-08-25 | Discharge: 2016-08-25 | Disposition: A | Discharge status: Home / Self Care | Payer: Medicare HMO | Attending: Emergency Medicine | Admitting: Emergency Medicine

## 2016-08-25 ENCOUNTER — Encounter (HOSPITAL_COMMUNITY): Payer: Self-pay | Admitting: Emergency Medicine

## 2016-08-25 DIAGNOSIS — R0609 Other forms of dyspnea: Secondary | ICD-10-CM | POA: Diagnosis not present

## 2016-08-25 DIAGNOSIS — I10 Essential (primary) hypertension: Secondary | ICD-10-CM | POA: Insufficient documentation

## 2016-08-25 DIAGNOSIS — Z8673 Personal history of transient ischemic attack (TIA), and cerebral infarction without residual deficits: Secondary | ICD-10-CM | POA: Diagnosis not present

## 2016-08-25 DIAGNOSIS — K219 Gastro-esophageal reflux disease without esophagitis: Secondary | ICD-10-CM

## 2016-08-25 DIAGNOSIS — E785 Hyperlipidemia, unspecified: Secondary | ICD-10-CM | POA: Diagnosis not present

## 2016-08-25 DIAGNOSIS — Z79899 Other long term (current) drug therapy: Secondary | ICD-10-CM | POA: Diagnosis not present

## 2016-08-25 DIAGNOSIS — R0600 Dyspnea, unspecified: Secondary | ICD-10-CM

## 2016-08-25 DIAGNOSIS — I739 Peripheral vascular disease, unspecified: Secondary | ICD-10-CM | POA: Diagnosis not present

## 2016-08-25 DIAGNOSIS — M79604 Pain in right leg: Secondary | ICD-10-CM

## 2016-08-25 DIAGNOSIS — M79662 Pain in left lower leg: Secondary | ICD-10-CM | POA: Diagnosis not present

## 2016-08-25 DIAGNOSIS — F17210 Nicotine dependence, cigarettes, uncomplicated: Secondary | ICD-10-CM | POA: Insufficient documentation

## 2016-08-25 DIAGNOSIS — M79605 Pain in left leg: Secondary | ICD-10-CM | POA: Insufficient documentation

## 2016-08-25 DIAGNOSIS — M79661 Pain in right lower leg: Secondary | ICD-10-CM | POA: Diagnosis not present

## 2016-08-25 DIAGNOSIS — E039 Hypothyroidism, unspecified: Secondary | ICD-10-CM | POA: Diagnosis not present

## 2016-08-25 DIAGNOSIS — R0602 Shortness of breath: Secondary | ICD-10-CM | POA: Diagnosis not present

## 2016-08-25 LAB — BASIC METABOLIC PANEL
Anion gap: 13 (ref 5–15)
BUN: 10 mg/dL (ref 6–20)
CHLORIDE: 105 mmol/L (ref 101–111)
CO2: 22 mmol/L (ref 22–32)
CREATININE: 1.54 mg/dL — AB (ref 0.44–1.00)
Calcium: 8.7 mg/dL — ABNORMAL LOW (ref 8.9–10.3)
GFR calc Af Amer: 39 mL/min — ABNORMAL LOW (ref >60)
GFR calc non Af Amer: 34 mL/min — ABNORMAL LOW (ref >60)
GLUCOSE: 99 mg/dL (ref 65–99)
Potassium: 3.9 mmol/L (ref 3.5–5.1)
SODIUM: 140 mmol/L (ref 135–145)

## 2016-08-25 LAB — CBC
HEMATOCRIT: 43.8 % (ref 36.0–46.0)
HEMOGLOBIN: 14.9 g/dL (ref 12.0–15.0)
MCH: 31.8 pg (ref 26.0–34.0)
MCHC: 34 g/dL (ref 30.0–36.0)
MCV: 93.6 fL (ref 78.0–100.0)
Platelets: 108 10*3/uL — ABNORMAL LOW (ref 150–400)
RBC: 4.68 MIL/uL (ref 3.87–5.11)
RDW: 14.5 % (ref 11.5–15.5)
WBC: 4.7 10*3/uL (ref 4.0–10.5)

## 2016-08-25 LAB — I-STAT TROPONIN, ED
Troponin i, poc: 0.01 ng/mL (ref 0.00–0.08)
Troponin i, poc: 0.01 ng/mL (ref 0.00–0.08)

## 2016-08-25 LAB — BRAIN NATRIURETIC PEPTIDE: B Natriuretic Peptide: 60.3 pg/mL (ref 0.0–100.0)

## 2016-08-25 MED ORDER — LISINOPRIL 40 MG PO TABS: 40.0000 mg | ORAL_TABLET | Freq: Every day | ORAL | 0 refills | Status: DC

## 2016-08-25 MED ORDER — PANTOPRAZOLE SODIUM 40 MG PO TBEC
40.0000 mg | DELAYED_RELEASE_TABLET | Freq: Once | ORAL | Status: AC
  Administered 2016-08-25: 40 mg via ORAL
  Filled 2016-08-25: qty 1

## 2016-08-25 MED ORDER — AMLODIPINE BESYLATE 5 MG PO TABS
10.0000 mg | ORAL_TABLET | Freq: Once | ORAL | Status: AC
  Administered 2016-08-25: 10 mg via ORAL
  Filled 2016-08-25: qty 2

## 2016-08-25 MED ORDER — HYDROXYZINE HCL 25 MG PO TABS: 25.0000 mg | ORAL_TABLET | Freq: Four times a day (QID) | ORAL | 0 refills | Status: DC | PRN

## 2016-08-25 MED ORDER — AMLODIPINE BESYLATE 10 MG PO TABS: 10.0000 mg | ORAL_TABLET | Freq: Every day | ORAL | 0 refills | Status: DC

## 2016-08-25 MED ORDER — IPRATROPIUM-ALBUTEROL 0.5-2.5 (3) MG/3ML IN SOLN
3.0000 mL | Freq: Once | RESPIRATORY_TRACT | Status: AC
  Administered 2016-08-25: 3 mL via RESPIRATORY_TRACT
  Filled 2016-08-25: qty 3

## 2016-08-25 MED ORDER — CLOPIDOGREL BISULFATE 75 MG PO TABS: 75.0000 mg | ORAL_TABLET | Freq: Every day | ORAL | 0 refills | Status: DC

## 2016-08-25 MED ORDER — PANTOPRAZOLE SODIUM 40 MG PO TBEC: 40.0000 mg | DELAYED_RELEASE_TABLET | Freq: Two times a day (BID) | ORAL | 0 refills | Status: DC

## 2016-08-25 MED ORDER — LEVOTHYROXINE SODIUM 112 MCG PO TABS: ORAL_TABLET | ORAL | 0 refills | Status: DC

## 2016-08-25 MED ORDER — FUROSEMIDE 20 MG PO TABS: 20.0000 mg | ORAL_TABLET | Freq: Every day | ORAL | 0 refills | Status: DC

## 2016-08-25 NOTE — ED Provider Notes (Signed)
[facility] DEPT Provider Note   CSN: [id number] Arrival date & time: 08/25/16  1035     History   Chief Complaint Chief Complaint  Patient presents with  . Shortness of Breath    HPI [name] is a 66 y.o. female with history of PAD, PUD, GERD, hypertension, hypothyroidism, depression, anxiety who presents with shortness of breath on exertion for the past 3 weeks. Patient has also had worsening leg pain bilaterally that she relates to her PAD. Patient denies any chest pain. Patient reports that she has not had any of her medications for the past 3 months. She notes that her epigastric pain from her ulcer has been worse as well as a raspy voice that she gets when her GERD is not controlled well. Patient reports she does not have a PCP at this time because she had an unstable living situation for a while. She is in a situation where she wants to try and get back with the PCP. She denies any nausea, vomiting, urinary symptoms.  HPI  Past Medical History:  Diagnosis Date  . Allergy   . Anal fissure   . Anxiety   . Cataract   . Chronic headaches   . Depression   . Diverticulosis   . GERD (gastroesophageal reflux disease)   . Glaucoma   . HLD (hyperlipidemia)   . Hypertension   . Hypothyroidism   . PAD (peripheral artery disease) ([place])   . PUD (peptic ulcer disease)   . TIA (transient ischemic attack)     Patient Active Problem List   Diagnosis Date Noted  . Pain and swelling of left lower leg 07/25/2015  . Knee pain, bilateral 03/30/2015  . Severe episode of recurrent major depressive disorder, without psychotic features ([place])   . Dysphagia 10/26/2014  . Pulsatile tinnitus of left ear 09/19/2014  . Blurry vision 09/19/2014  . Excessive daytime sleepiness 09/19/2014  . Morbid obesity ([place]) 09/19/2014  . Left-sided weakness 04/20/2014  . Tobacco use disorder 04/19/2014  . Intermittent claudication ([place]) 01/20/2013  . Eye mass 12/13/2012  . Cataract 12/13/2012    . Recurrent headache 12/13/2012  . Major depressive disorder, recurrent episode, severe ([place]) 10/29/2012  . Anxiety state, unspecified 09/20/2012  . Abnormality of gait 04/21/2012  . PAD (peripheral artery disease) ([place]) 04/15/2012  . Hyperlipidemia 04/15/2012  . Hypertension 04/15/2012  . Hypothyroidism 04/15/2012  . GERD (gastroesophageal reflux disease) 04/15/2012  . Routine health maintenance 04/15/2012    Past Surgical History:  Procedure Laterality Date  . ABDOMINAL AORTAGRAM N/A 01/20/2013   Procedure: ABDOMINAL Maxcine Ham;  Surgeon: Conrad Clendenin, MD;  Location: [facility] CATH LAB;  Service: Cardiovascular;  Laterality: N/A;  . ANAL FISSURE REPAIR    . AORTOGRAM    . BREAST REDUCTION SURGERY    . CATARACT EXTRACTION Left   . iliac artery angioplasty and stenting Left 12/2012  . SFA stent Left 2012   at [facility]  . STOMACH SURGERY    . TOTAL THYROIDECTOMY      OB History    No data available       Home Medications    Prior to Admission medications   Medication Sig Start Date End Date Taking? Authorizing Provider  acetaminophen (TYLENOL) 500 MG tablet Take 2 tablets (1,000 mg total) by mouth every 6 (six) hours as needed for headache. 02/21/15  Yes Nwoko, Herbert Pun I, NP  diphenhydramine-acetaminophen (TYLENOL PM) 25-500 MG TABS tablet Take 1 tablet by mouth at bedtime.  Yes [provider]  POTASSIUM PO Take 1 tablet by mouth daily.   Yes [provider]  amLODipine (NORVASC) 10 MG tablet Take 1 tablet (10 mg total) by mouth daily. 08/25/16   Keondria Siever, Bea Graff, PA-C  ARIPiprazole (ABILIFY) 2 MG tablet Take 1 tablet (2 mg total) by mouth daily. For mood control Patient not taking: Reported on 08/25/2016 10/17/15   Holley Raring, MD  clopidogrel (PLAVIX) 75 MG tablet Take 1 tablet (75 mg total) by mouth daily. For Peripheral Arterial disease 08/25/16   Frederica Kuster, PA-C  cyclobenzaprine (FLEXERIL) 10 MG tablet Take 1 tablet (10 mg total) by  mouth every 8 (eight) hours as needed for muscle spasms. Patient not taking: Reported on 08/25/2016 10/17/15 10/16/16  Holley Raring, MD  doxepin (SINEQUAN) 100 MG capsule TAKE 1 CAPSULE BY MOUTH AT BEDTIME. Patient not taking: Reported on 08/25/2016 02/13/16   Holley Raring, MD  furosemide (LASIX) 20 MG tablet Take 1 tablet (20 mg total) by mouth daily. 08/25/16   Almee Pelphrey, Bea Graff, PA-C  HYDROcodone-acetaminophen (NORCO/VICODIN) 5-325 MG tablet Take 1 tablet by mouth every 4 (four) hours as needed for moderate pain. Patient not taking: Reported on 08/25/2016 10/18/15   Holley Raring, MD  hydrOXYzine (ATARAX/VISTARIL) 25 MG tablet Take 1 tablet (25 mg total) by mouth every 6 (six) hours as needed for anxiety (sleep). 08/25/16   Addis Tuohy, Bea Graff, PA-C  levothyroxine (SYNTHROID, LEVOTHROID) 112 MCG tablet TAKE 1 TABLET (112 MCG TOTAL) BY MOUTH DAILY BEFORE BREAKFAST. 08/25/16   Robecca Fulgham, Bea Graff, PA-C  lisinopril (PRINIVIL,ZESTRIL) 40 MG tablet Take 1 tablet (40 mg total) by mouth daily. 08/25/16   Amedee Cerrone, Bea Graff, PA-C  nicotine polacrilex (NICORETTE) 2 MG gum Take 1 each (2 mg total) by mouth as needed for smoking cessation. Patient not taking: Reported on 08/25/2016 02/21/15   Lindell Spar I, NP  pantoprazole (PROTONIX) 40 MG tablet Take 1 tablet (40 mg total) by mouth 2 (two) times daily. For acid reflux 08/25/16   Depaul Arizpe, Bea Graff, PA-C  predniSONE (DELTASONE) 20 MG tablet Take 1 tablet (20 mg total) by mouth daily with breakfast. Patient not taking: Reported on 08/25/2016 09/27/15 09/26/16  Holley Raring, MD  sertraline (ZOLOFT) 100 MG tablet TAKE 1 TABLET (100 MG TOTAL) BY MOUTH DAILY. Patient not taking: Reported on 08/25/2016 12/20/15   Holley Raring, MD  sertraline (ZOLOFT) 50 MG tablet Take 3 tablets (150 mg total) by mouth daily. For depression Patient not taking: Reported on 08/25/2016 02/21/15   Lindell Spar I, NP  traMADol (ULTRAM) 50 MG tablet Take 1 tablet (50 mg total) by mouth every 6 (six) hours  as needed. Patient not taking: Reported on 08/25/2016 09/21/15   Holley Raring, MD  traZODone (DESYREL) 100 MG tablet Take 1 tablet (100 mg total) by mouth at bedtime. For sleep Patient not taking: Reported on 08/25/2016 02/21/15   Encarnacion Slates, NP    Family History Family History  Problem Relation Age of Onset  . Peripheral vascular disease Mother   . Heart disease Mother   . Hypertension Mother   . AAA (abdominal aortic aneurysm) Father   . Hypertension Father   . Uterine cancer Sister        mets to brain  . Liver cancer Brother   . Stomach cancer Maternal Aunt        x2  . Stomach cancer Paternal Aunt   . Heart disease Brother   . Kidney cancer Brother   .  Anuerysm Brother        brain  . HIV Brother        x2  . Aneurysm Father     Social History Social History  Substance Use Topics  . Smoking status: Current Some Day Smoker    Packs/day: 0.50    Years: 0.50    Types: Cigarettes  . Smokeless tobacco: Never Used     Comment: 1 pack per 2 days  . Alcohol use No     Allergies   Asa [aspirin]; Crestor [rosuvastatin calcium]; Penicillins; and Gadolinium derivatives   Review of Systems Review of Systems  Constitutional: Negative for chills and fever.  HENT: Negative for facial swelling and sore throat.   Respiratory: Positive for shortness of breath (on exertion).   Cardiovascular: Negative for chest pain.  Gastrointestinal: Negative for abdominal pain, nausea and vomiting.  Genitourinary: Negative for dysuria.  Musculoskeletal: Positive for myalgias. Negative for back pain.  Skin: Negative for rash and wound.  Neurological: Negative for headaches.  Psychiatric/Behavioral: The patient is not nervous/anxious.      Physical Exam Updated Vital Signs BP (!) 188/112   Pulse 76   Temp 97.9 F (36.6 C) (Oral)   Resp 15   SpO2 98%   Physical Exam  Constitutional: She appears well-developed and well-nourished. No distress.  HENT:  Head: Normocephalic and  atraumatic.  Mouth/Throat: Oropharynx is clear and moist. No oropharyngeal exudate.  Eyes: Pupils are equal, round, and reactive to light. Conjunctivae are normal. Right eye exhibits no discharge. Left eye exhibits no discharge. No scleral icterus.  Neck: Normal range of motion. Neck supple. No thyromegaly present.  Cardiovascular: Normal rate, regular rhythm, normal heart sounds and intact distal pulses.  Exam reveals no gallop and no friction rub.   No murmur heard. Pulmonary/Chest: Effort normal and breath sounds normal. No stridor. No respiratory distress. She has no wheezes. She has no rales.  Abdominal: Soft. Bowel sounds are normal. She exhibits no distension. There is tenderness in the epigastric area. There is no rebound and no guarding.  Musculoskeletal: She exhibits no edema.  Bilateral leg tenderness with edema and skin hyperpigmentation  Lymphadenopathy:    She has no cervical adenopathy.  Neurological: She is alert. Coordination normal.  Skin: Skin is warm and dry. No rash noted. She is not diaphoretic. No pallor.  Psychiatric: She has a normal mood and affect.  Nursing note and vitals reviewed.    ED Treatments / Results  Labs (all labs ordered are listed, but only abnormal results are displayed) Labs Reviewed  BASIC METABOLIC PANEL - Abnormal; Notable for the following:       Result Value   Creatinine, Ser 1.54 (*)    Calcium 8.7 (*)    GFR calc non Af Amer 34 (*)    GFR calc Af Amer 39 (*)    All other components within normal limits  CBC - Abnormal; Notable for the following:    Platelets 108 (*)    All other components within normal limits  BRAIN NATRIURETIC PEPTIDE  I-STAT TROPONIN, ED  I-STAT TROPONIN, ED    EKG  EKG Interpretation  Date/Time:  Monday August 25 2016 10:40:09 EDT Ventricular Rate:  84 PR Interval:  156 QRS Duration: 56 QT Interval:  444 QTC Calculation: 524 R Axis:     Text Interpretation:  Normal sinus rhythm Low voltage QRS Cannot  rule out Anteroseptal infarct , age undetermined Abnormal ECG No significant change since last tracing Confirmed by  Dorie Rank ([id number]) on 08/25/2016 1:59:37 PM       Radiology Dg Chest 2 View  Result Date: 08/25/2016 CLINICAL DATA:  66 year old female with shortness of breath for 1 week. EXAM: CHEST  2 VIEW COMPARISON:  12/25/2012 FINDINGS: The heart size and mediastinal contours are enlarged but grossly stable. Both lungs are clear. The visualized skeletal structures are unremarkable. IMPRESSION: No active cardiopulmonary disease. Electronically Signed   By: Kristopher Oppenheim M.D.   On: 08/25/2016 11:17    Procedures Procedures (including critical care time)  Medications Ordered in ED Medications  amLODipine (NORVASC) tablet 10 mg (10 mg Oral Given 08/25/16 1344)  pantoprazole (PROTONIX) EC tablet 40 mg (40 mg Oral Given 08/25/16 1344)  ipratropium-albuterol (DUONEB) 0.5-2.5 (3) MG/3ML nebulizer solution 3 mL (3 mLs Nebulization Given 08/25/16 1544)     Initial Impression / Assessment and Plan / ED Course  I have reviewed the triage vital signs and the nursing notes.  Pertinent labs & imaging results that were available during my care of the patient were reviewed by me and considered in my medical decision making (see chart for details).     CBC shows platelets 108K. BMP shows creatinine 1.54, calcium 8.7, GFR 39. BNP 60.3. Delta troponin negative. CXR shows no active cardiopulmonary disease. EKG shows NSR, no significant change since last tracing. I will discharge patient home with hypertension medications as well as Synthroid, Plavix, low dose Lasix, Prontonix, and Vistaril for sleep.Patient advised to follow up and establish care with PCP as soon as possible. Return precautions discussed. Patient understands and agrees with plan. Patient BP initially improved after amlodipine, otherwise stable vitals. Patient discharged in satisfactory condition. Patient also evaluated by Dr. Tomi Bamberger who  guided the patient's management and agrees with plan.  Final Clinical Impressions(s) / ED Diagnoses   Final diagnoses:  Dyspnea on exertion  Pain in both lower extremities  PAD (peripheral artery disease) [facility])    New Prescriptions Current Discharge Medication List       Frederica Kuster, Hershal Coria 08/25/16 1621

## 2016-08-25 NOTE — ED Notes (Signed)
Patient presents to ed c/o sob and bilateral leg pain also c/o dizziness , states she is essentially homeless and hasn't taken any of her medication in 2 months. Rates pain 10/10. States she doesn't have a medical doctor.

## 2016-08-25 NOTE — ED Provider Notes (Signed)
Medical screening examination/treatment/procedure(s) were performed by non-physician practitioner and as supervising physician I was immediately available for consultation/collaboration.   EKG Interpretation  Date/Time:  Monday August 25 2016 10:40:09 EDT Ventricular Rate:  84 PR Interval:  156 QRS Duration: 56 QT Interval:  444 QTC Calculation: 524 R Axis:     Text Interpretation:  Normal sinus rhythm Low voltage QRS Cannot rule out Anteroseptal infarct , age undetermined Abnormal ECG No significant change since last tracing Confirmed by Dorie Rank [phone]) on 08/25/2016 1:59:37 PM      Pt presents to the ED with various complaints.  Pt has a history of COPD, PAD.  She unfortunately still smokes and has not been able to take her medications for a while.  Will check labs, CXR.   Suspect her symptoms are more chronic.  If ed eval is reassuring will plan on dc home with her home meds   Dorie Rank, MD 08/25/16 1422

## 2016-08-25 NOTE — ED Notes (Signed)
Pt is resting comfortably, eating and drinking without difficulty.  Pt denies diff breathing but states that she gets SOB with exertion.  Pt is A&Ox 4.

## 2016-08-25 NOTE — ED Notes (Signed)
RT at bedside to start neb tx

## 2016-08-25 NOTE — ED Triage Notes (Signed)
Pt sts SOB and cough; pt sts generalized weakness and blurry vision

## 2016-08-25 NOTE — Discharge Instructions (Signed)
Resume taking your medications as prescribed. Please follow-up with a primary care provider for further evaluation and treatment and for prescribing the rest of your medications as needed. Please return to the emergency department if you develop any new or worsening symptoms.

## 2016-09-11 ENCOUNTER — Ambulatory Visit (INDEPENDENT_AMBULATORY_CARE_PROVIDER_SITE_OTHER): Payer: Medicare HMO | Admitting: Internal Medicine

## 2016-09-11 VITALS — BP 158/91 | HR 81 | Temp 98.0°F | Wt 220.2 lb

## 2016-09-11 DIAGNOSIS — F411 Generalized anxiety disorder: Secondary | ICD-10-CM | POA: Diagnosis not present

## 2016-09-11 DIAGNOSIS — F17210 Nicotine dependence, cigarettes, uncomplicated: Secondary | ICD-10-CM | POA: Diagnosis not present

## 2016-09-11 DIAGNOSIS — M7989 Other specified soft tissue disorders: Secondary | ICD-10-CM

## 2016-09-11 DIAGNOSIS — F332 Major depressive disorder, recurrent severe without psychotic features: Secondary | ICD-10-CM | POA: Diagnosis not present

## 2016-09-11 DIAGNOSIS — I10 Essential (primary) hypertension: Secondary | ICD-10-CM | POA: Diagnosis not present

## 2016-09-11 DIAGNOSIS — E039 Hypothyroidism, unspecified: Secondary | ICD-10-CM | POA: Diagnosis not present

## 2016-09-11 DIAGNOSIS — M79662 Pain in left lower leg: Secondary | ICD-10-CM

## 2016-09-11 DIAGNOSIS — Z79899 Other long term (current) drug therapy: Secondary | ICD-10-CM

## 2016-09-11 DIAGNOSIS — I739 Peripheral vascular disease, unspecified: Secondary | ICD-10-CM | POA: Diagnosis not present

## 2016-09-11 DIAGNOSIS — Z8249 Family history of ischemic heart disease and other diseases of the circulatory system: Secondary | ICD-10-CM | POA: Diagnosis not present

## 2016-09-11 MED ORDER — AMLODIPINE BESYLATE 10 MG PO TABS: 10.0000 mg | ORAL_TABLET | Freq: Every day | ORAL | 3 refills | Status: DC

## 2016-09-11 MED ORDER — FUROSEMIDE 20 MG PO TABS: 20.0000 mg | ORAL_TABLET | Freq: Every day | ORAL | 3 refills | Status: DC

## 2016-09-11 MED ORDER — CLOPIDOGREL BISULFATE 75 MG PO TABS: 75.0000 mg | ORAL_TABLET | Freq: Every day | ORAL | 3 refills | Status: DC

## 2016-09-11 MED ORDER — HYDROCHLOROTHIAZIDE 12.5 MG PO TABS: 12.5000 mg | ORAL_TABLET | Freq: Every day | ORAL | 3 refills | Status: DC

## 2016-09-11 MED ORDER — HYDROCHLOROTHIAZIDE 12.5 MG PO TABS: 25.0000 mg | ORAL_TABLET | Freq: Every day | ORAL | 3 refills | Status: DC

## 2016-09-11 MED ORDER — GABAPENTIN 300 MG PO CAPS: 300.0000 mg | ORAL_CAPSULE | Freq: Three times a day (TID) | ORAL | 3 refills | Status: DC

## 2016-09-11 MED ORDER — LEVOTHYROXINE SODIUM 112 MCG PO TABS: ORAL_TABLET | ORAL | 3 refills | Status: DC

## 2016-09-11 MED ORDER — DOXEPIN HCL 100 MG PO CAPS: 100.0000 mg | ORAL_CAPSULE | Freq: Every day | ORAL | 3 refills | Status: DC

## 2016-09-11 NOTE — Assessment & Plan Note (Addendum)
Patient was previously on sertraline 100 mg daily. However, reports she takes this only when she is having panic attacks. Discussed with patient that his medication is effective for panic attacks when taken long-term on a daily basis. States she would not like to take this medication daily as she does not feel well when she does.   - STOP sertraline as patient not taking it as prescribed  - Start doxepin 100mg  daily (patient previously on it for depression, anxiety, and sleep)

## 2016-09-11 NOTE — Patient Instructions (Addendum)
It was nice to meet you today, [name].   Please start taking gabapentin 300 mg. Start by taking 1 tablet at night. If you experience no sleepiness from this start taking 1 tablet in the morning as well. If you continue to have no side effects, you can start taking the medication 3 times a day.  Please start taking hydrochlorothiazide 12.5 mg 1 tablet in the morning every day. Continue taking amlodipine as always.  Stop using compression stockings at night and start using them during the day.  Continue taking your Synthroid for your thyroid. We checked your thyroid levels today. I will call you if we need to make adjustments in your medications.  Please make a follow-up appointment in 2-3 months.

## 2016-09-11 NOTE — Assessment & Plan Note (Addendum)
Patient presents today with complaint of severe bilateral leg pain that is worse on the left and is associated with swelling. From review of chart this pain is chronic in nature, but the patient reported it has progressively worsened in the past year. The pain is burning and shooting in nature, and is worse during ambulation. States it starts in her foot and radiates up to the thigh. She has been using compression stockings at night with no improvement in symptoms. States she has also been experiencing weakness and reports falling twice recently. No injuries from these falls. She also reports numbness and tingling in both feet that is new. She has a history of PAD s/p L SFA stent in 2012 and L iliac stent 2017 and was seen by vascular surgery in 2017 at which time she had normal ABIs. She was seen in the ED 8/13 for DOE and worsening bilateral leg pain. No interventions performed in the ED.   On exam she has limited range of motion at the ankle due to pain but full strength on both lower extremities. Mild swelling noted in both feet and hyperpigmentation that is chronic. No gait abnormalities. Patient has had 2 skin biopsies in the past both consistent with venous stasis dermatitis. LE hyperpigmentation today consistent with venous stasis dermatitis as well. Worsening edema likely secondary to restarting amlodipine recently.  - Gabapentin 300 mg QHS initially. Patient given instructions to up titrate to TID as tolerated. - Patient instructed to wear compression stockings on today and take them off at night - Follow-up in 2-3 months

## 2016-09-11 NOTE — Assessment & Plan Note (Signed)
-   Restart doxepin 100mg  daily. Please see A&P for MDD for further details.

## 2016-09-11 NOTE — Assessment & Plan Note (Signed)
BP today 158/91. Recently started on amlodipine 10mg  daily in the ED 8/13. Reports compliance. Denies headache, blurry vision, chest pain, short of breath.  HTN: Uncontrolled - Continue amlodipine 10 mg daily - Start HCTZ 25 mg daily  - Follow up in 2-3 months

## 2016-09-11 NOTE — Assessment & Plan Note (Signed)
Patient has a history of PAD s/p L SFA stent in 2012 and L iliac stent 2017. She was seen by vascular surgery in 2017 at which time she had normal ABIs. She reports LE pain worsened by ambulation. LE warm and well perfused on exam today.   - Refilled Plavix 75mg  daily - Follow up in 2-3 months

## 2016-09-11 NOTE — Assessment & Plan Note (Addendum)
Patient is s/p thyroidectomy in 2011 and has not been taking Synthroid for the past 3-4 months due to financial issues. Restarted Synthroid 125mcg 2 weeks ago. Last TSH 1.0 07/2015.   - TSH. Will call patient if adjustments in Synthroid dose needed.  Addendum: TSH 50. Discussed results with patient instructed her to make follow-up appointment in 4 weeks to recheck TSH as she has only been on medication for 2 weeks.

## 2016-09-11 NOTE — Progress Notes (Signed)
   CC: Bilateral leg pain  HPI:  [name] is a 66 y.o. female with PMH listed below who presents to clinic for worsening bilateral leg pain. Please see problem based assessment and plan for further details.  Of note, patient was lost to follow-up and has not established with a PCP in the past year and a half. Has been taking her medications intermittently during this time, but reports being out of them for the past 3-4 months.    Past Medical History:  Diagnosis Date  . Allergy   . Anal fissure   . Anxiety   . Cataract   . Chronic headaches   . Depression   . Diverticulosis   . GERD (gastroesophageal reflux disease)   . Glaucoma   . HLD (hyperlipidemia)   . Hypertension   . Hypothyroidism   . PAD (peripheral artery disease) ([place])   . PUD (peptic ulcer disease)   . TIA (transient ischemic attack)    Review of Systems:   Review of Systems  Constitutional: Negative for chills and fever.  Respiratory: Negative for cough and shortness of breath.   Cardiovascular: Positive for leg swelling. Negative for chest pain and palpitations.  Gastrointestinal: Negative for abdominal pain, constipation, diarrhea, nausea and vomiting.  Musculoskeletal: Positive for falls and myalgias. Negative for back pain and joint pain.  Skin: Positive for rash. Negative for itching.  Neurological: Positive for tingling, sensory change, focal weakness and weakness. Negative for dizziness, tremors, speech change and headaches.    Physical Exam:  Vitals:   09/11/16 1323  BP: (!) 158/91  Pulse: 81  Temp: 98 F (36.7 C)  TempSrc: Oral  SpO2: 97%  Weight: 220 lb 3.2 oz (99.9 kg)   General: Pleasant female, well-nourished, well-developed, in no acute distress Cardiac: regular rate and rhythm, nl S1/S2, no murmurs, rubs or gallops  Pulm: CTAB, no wheezes or crackles, no increased work of breathing  Abd: soft, NTND, bowel sounds present  Neuro: A&Ox3,  limited range of motion at the ankle  bilaterally secondary to pain but full strength on both lower extremities, sensation to light touch intact  Ext: warm and well perfused, 1+ pitting peripheral edema bilaterally up to shins, 1+ DP pulses bilaterally, no gait abnormalities observed  Derm: Lenticular rash observed on both lower extremities, hyperpigmentation of lower extremities up to shin is worse on the LLE, no erythema or warmth noted, no lesions noted Psych: attentive, appropriate affect, answers questions appropriately    Assessment & Plan:   See Encounters Tab for problem based charting.  Patient seen with Dr. Angelia Mould

## 2016-09-12 ENCOUNTER — Telehealth: Payer: Self-pay | Admitting: *Deleted

## 2016-09-12 LAB — T4, FREE: Free T4: 0.97 ng/dL (ref 0.82–1.77)

## 2016-09-12 LAB — TSH: TSH: 50.93 u[IU]/mL — AB (ref 0.450–4.500)

## 2016-09-12 NOTE — Telephone Encounter (Addendum)
Information was sen through CoverMyMeds for [place] for  Doxepin 100 mg capsules.  Determination to be done within 72 hours. Sander Nephew, RN 11:30 AM.

## 2016-09-15 NOTE — Progress Notes (Signed)
Internal Medicine Clinic Attending  I saw and evaluated the patient.  I personally confirmed the key portions of the history and exam documented by Dr. Isac Sarna and I reviewed pertinent patient test results.  The assessment, diagnosis, and plan were formulated together and I agree with the documentation in the resident's note. Her Elevated TSH is as Dr Isac Sarna discussed, a reflection of her pervious non adherence. Her free t4 is in range and it will take a few months for the TSH to reflect her current dose.

## 2016-10-01 ENCOUNTER — Telehealth: Payer: Self-pay

## 2016-10-01 NOTE — Telephone Encounter (Signed)
Questions about meds. Please call pt back.

## 2016-10-03 ENCOUNTER — Other Ambulatory Visit: Payer: Self-pay | Admitting: *Deleted

## 2016-10-03 NOTE — Telephone Encounter (Signed)
Called pt, explained meds, sent request for flexeril

## 2016-10-08 ENCOUNTER — Other Ambulatory Visit: Payer: Self-pay

## 2016-10-08 NOTE — Addendum Note (Signed)
Addended by: Welford Roche on: 10/08/2016 05:19 AM   Modules accepted: Orders

## 2016-10-14 ENCOUNTER — Other Ambulatory Visit: Payer: Self-pay

## 2016-10-14 ENCOUNTER — Other Ambulatory Visit: Payer: Self-pay | Admitting: Internal Medicine

## 2016-10-14 DIAGNOSIS — K219 Gastro-esophageal reflux disease without esophagitis: Secondary | ICD-10-CM

## 2016-10-14 NOTE — Telephone Encounter (Signed)
Hydroxyzine removed from med list. Also was sent to mental health so anxiety meds should come from pysch

## 2016-10-15 ENCOUNTER — Other Ambulatory Visit: Payer: Self-pay

## 2016-10-17 NOTE — Progress Notes (Signed)
Called patient today about need for TSH lab as she restarted synthroid more than 6 weeks ago and might need adjustment in dose. Patient stated she will come next week.

## 2016-11-07 ENCOUNTER — Telehealth: Payer: Self-pay | Admitting: *Deleted

## 2016-11-07 NOTE — Telephone Encounter (Signed)
Call made to patient at DrSantos-Sanchez's request.  Pt needs to have a TSH drawn. HIPPA compliant  message left on pt's recorder to contact the clinic for lab only appt.[name], Kamani Magnussen Cassady10/26/20181:41 PM

## 2016-12-12 ENCOUNTER — Encounter: Payer: Self-pay | Admitting: Internal Medicine

## 2016-12-12 ENCOUNTER — Ambulatory Visit (INDEPENDENT_AMBULATORY_CARE_PROVIDER_SITE_OTHER): Payer: Medicare HMO | Admitting: Internal Medicine

## 2016-12-12 ENCOUNTER — Other Ambulatory Visit: Payer: Self-pay

## 2016-12-12 VITALS — BP 141/86 | HR 87 | Temp 97.6°F | Ht 62.0 in | Wt 225.3 lb

## 2016-12-12 DIAGNOSIS — F411 Generalized anxiety disorder: Secondary | ICD-10-CM

## 2016-12-12 DIAGNOSIS — M7989 Other specified soft tissue disorders: Secondary | ICD-10-CM

## 2016-12-12 DIAGNOSIS — I739 Peripheral vascular disease, unspecified: Secondary | ICD-10-CM

## 2016-12-12 DIAGNOSIS — Z79899 Other long term (current) drug therapy: Secondary | ICD-10-CM

## 2016-12-12 DIAGNOSIS — E785 Hyperlipidemia, unspecified: Secondary | ICD-10-CM

## 2016-12-12 DIAGNOSIS — M79662 Pain in left lower leg: Secondary | ICD-10-CM | POA: Diagnosis not present

## 2016-12-12 DIAGNOSIS — Z79890 Hormone replacement therapy (postmenopausal): Secondary | ICD-10-CM | POA: Diagnosis not present

## 2016-12-12 DIAGNOSIS — Z95820 Peripheral vascular angioplasty status with implants and grafts: Secondary | ICD-10-CM

## 2016-12-12 DIAGNOSIS — E039 Hypothyroidism, unspecified: Secondary | ICD-10-CM

## 2016-12-12 DIAGNOSIS — M62830 Muscle spasm of back: Secondary | ICD-10-CM | POA: Diagnosis not present

## 2016-12-12 DIAGNOSIS — R6889 Other general symptoms and signs: Secondary | ICD-10-CM | POA: Diagnosis not present

## 2016-12-12 DIAGNOSIS — F17210 Nicotine dependence, cigarettes, uncomplicated: Secondary | ICD-10-CM | POA: Diagnosis not present

## 2016-12-12 DIAGNOSIS — I10 Essential (primary) hypertension: Secondary | ICD-10-CM | POA: Diagnosis not present

## 2016-12-12 DIAGNOSIS — M62838 Other muscle spasm: Secondary | ICD-10-CM | POA: Insufficient documentation

## 2016-12-12 DIAGNOSIS — Z0000 Encounter for general adult medical examination without abnormal findings: Secondary | ICD-10-CM

## 2016-12-12 MED ORDER — CLOPIDOGREL BISULFATE 75 MG PO TABS: 75.0000 mg | ORAL_TABLET | Freq: Every day | ORAL | 3 refills | Status: DC

## 2016-12-12 MED ORDER — CYCLOBENZAPRINE HCL 5 MG PO TABS: 5.0000 mg | ORAL_TABLET | Freq: Two times a day (BID) | ORAL | 0 refills | Status: AC | PRN

## 2016-12-12 MED ORDER — DOXEPIN HCL 100 MG PO CAPS: 100.0000 mg | ORAL_CAPSULE | Freq: Every day | ORAL | 3 refills | Status: DC

## 2016-12-12 MED ORDER — HYDROCHLOROTHIAZIDE 12.5 MG PO TABS: 25.0000 mg | ORAL_TABLET | Freq: Every day | ORAL | 3 refills | Status: DC

## 2016-12-12 MED ORDER — GABAPENTIN 400 MG PO CAPS: 400.0000 mg | ORAL_CAPSULE | Freq: Three times a day (TID) | ORAL | 1 refills | Status: DC

## 2016-12-12 MED ORDER — FUROSEMIDE 20 MG PO TABS: 20.0000 mg | ORAL_TABLET | Freq: Every day | ORAL | 3 refills | Status: DC

## 2016-12-12 MED ORDER — AMLODIPINE BESYLATE 10 MG PO TABS: 10.0000 mg | ORAL_TABLET | Freq: Every day | ORAL | 3 refills | Status: DC

## 2016-12-12 NOTE — Assessment & Plan Note (Signed)
The patient during this visit was 145/85 with repeat being 141/86.  The patient currently taking amlodipine 10 mg daily, hydrochlorothiazide, Lasix 20 mg daily 25 mg daily.   -recommended patient to continue previous medication

## 2016-12-12 NOTE — Assessment & Plan Note (Signed)
Patient refused flu shot  Patient referred to get DEXA scan for screening. I believe she would benefit from medication if she has osteoporosis as she is prone to falls especially with her pad

## 2016-12-12 NOTE — Assessment & Plan Note (Signed)
The patient states that she continues to have fatigue, palpitations, and night sweats. She denies any headaches or fever. She has lost 5lbs since previous appointment on 09/11/16. The patient had a thyroidectomy done in 2011. Her last TSH level was 50.9 on 09/11/2016 and free T4 was 0.97.  Patient is currently taking levothyroxine 112 MCG daily.  -We will get a TSH level today to determine how much to adjust the dose of levothyroxine -Continue levothyroxine 112 mcg daily

## 2016-12-12 NOTE — Patient Instructions (Addendum)
It was a pleasure to see you today Ms. Gaughan. Please make the following changes:  -continue taking all your medication -Your gabapentin was increased to 400mg  tid  If you have any questions or concerns, please call our clinic at [phone] between 9am-5pm and after hours call [phone] and ask for the internal medicine resident on call. If you feel you are having a medical emergency please call 911.   Thank you, we look forward to help you remain healthy!  Lars Mage, MD Internal Medicine PGY1

## 2016-12-12 NOTE — Assessment & Plan Note (Signed)
The patient states that she was previously given a muscle relaxant by her neurologist to help prevent her from getting a cerebral aneurysm. There is no documentation of this noted in the chart. However, since the patients neck musculature did seem tense to palpation gave a two week supply of flexeril 5mg  bid #28. Informed the patient that this is a one time thing as muscle relaxants should not be used chronically.

## 2016-12-12 NOTE — Assessment & Plan Note (Signed)
The patient states that she has been having pain in her bilateral legs that has been hurting when she walks. The patient's last ABIs were in 2017 when she has normal ABIs. She has had a L SFA stent placement in 2012 and L iliac stent placement in 2017.   Repeated ABIs today which showed measurements indicative of severe PAD.   -Referral to vascular surgery made

## 2016-12-12 NOTE — Assessment & Plan Note (Signed)
The patient continues to state that she has bilateral lower extremity pain that is burning and tingling in nature. She has been using 600mg  tid. The patient's crcl is 56 and therefore I instructed the patient to not take that much gabapentin.   -Prescribed 400mg  tid -I suspect that a component of the patient's lower extremity pain is also due to her PAD and therefore referred to vascular surgery.

## 2016-12-12 NOTE — Progress Notes (Signed)
   CC: Hypothyroidism follow up  HPI:  [name] is a 66 y.o. female with pmh of peripheral artery disease, hypertension, hypothyroidism, and hyperlipidemia who presented to the clinic today to be followed up for her hypothyroidism. Please see problem based charting for evaluation, assessment, and plan.  Past Medical History:  Diagnosis Date  . Allergy   . Anal fissure   . Anxiety   . Cataract   . Chronic headaches   . Depression   . Diverticulosis   . GERD (gastroesophageal reflux disease)   . Glaucoma   . HLD (hyperlipidemia)   . Hypertension   . Hypothyroidism   . PAD (peripheral artery disease) ([place])   . PUD (peptic ulcer disease)   . TIA (transient ischemic attack)    Review of Systems:  Denies headaches, fever  Physical Exam:  Vitals:   12/12/16 1446 12/12/16 1713  BP: (!) 148/85 (!) 141/86  Pulse: 95 87  Temp: 97.6 F (36.4 C)   TempSrc: Oral   SpO2: 99%   Weight: 225 lb 4.8 oz (102.2 kg)   Height: 5\' 2"  (1.575 m)    Physical Exam  Constitutional: She appears well-developed and well-nourished. No distress.  HENT:  Head: Normocephalic and atraumatic.  Eyes: Conjunctivae are normal.  Neck: Normal range of motion.  Cardiovascular: Normal rate, regular rhythm and normal heart sounds.  Respiratory: Effort normal and breath sounds normal. No respiratory distress. She has no wheezes.  GI: Soft. Bowel sounds are normal. She exhibits no distension. There is no tenderness.  Musculoskeletal:       Cervical back: She exhibits spasm (muscle).  Decreased rom. She has difficulty with ambulation  Neurological: She is alert.  Skin: She is not diaphoretic.  Psychiatric: She has a normal mood and affect. Her behavior is normal. Judgment and thought content normal.    Assessment & Plan:   See Encounters Tab for problem based charting.  Patient seen with Dr. Daryll Drown

## 2016-12-13 LAB — TSH: TSH: 3.03 u[IU]/mL (ref 0.450–4.500)

## 2016-12-13 NOTE — Progress Notes (Signed)
Internal Medicine Clinic Attending  I saw and evaluated the patient.  I personally confirmed the key portions of the history and exam documented by Dr. Chundi and I reviewed pertinent patient test results.  The assessment, diagnosis, and plan were formulated together and I agree with the documentation in the resident's note. 

## 2017-01-16 ENCOUNTER — Ambulatory Visit: Payer: Medicare HMO | Admitting: Vascular Surgery

## 2017-01-16 ENCOUNTER — Encounter: Payer: Self-pay | Admitting: Vascular Surgery

## 2017-01-16 VITALS — BP 138/97 | HR 94 | Resp 18 | Ht 62.0 in | Wt 226.3 lb

## 2017-01-16 DIAGNOSIS — F17200 Nicotine dependence, unspecified, uncomplicated: Secondary | ICD-10-CM | POA: Diagnosis not present

## 2017-01-16 DIAGNOSIS — I779 Disorder of arteries and arterioles, unspecified: Secondary | ICD-10-CM | POA: Diagnosis not present

## 2017-01-16 DIAGNOSIS — M79605 Pain in left leg: Secondary | ICD-10-CM

## 2017-01-16 DIAGNOSIS — R6889 Other general symptoms and signs: Secondary | ICD-10-CM | POA: Diagnosis not present

## 2017-01-16 DIAGNOSIS — M79604 Pain in right leg: Secondary | ICD-10-CM | POA: Diagnosis not present

## 2017-01-16 DIAGNOSIS — Z6841 Body Mass Index (BMI) 40.0 and over, adult: Secondary | ICD-10-CM | POA: Diagnosis not present

## 2017-01-16 NOTE — Progress Notes (Signed)
Patient ID: [name], female   DOB: 1950-06-14, 67 y.o.   MRN: [id number]  Reason for Consult: Follow-up (eval PAD)   Referred by Lars Mage, MD  Subjective:     HPI:  [name] is a 67 y.o. female with history of left SFA stent 2013 by Dr. Madaline Brilliant in [facility] and bilateral external iliac artery stents 4 years ago by Dr. Bridgett Larsson here.  She has subsequently been lost to follow-up with last follow-up over 2 years ago.  She now presents with bilateral lower extremity pain.  She also has associated discoloration.  Most of her pain is concentrated in the right groin and is exacerbated with walking and with any pressure.  She also has burning pain in her bilateral feet for which she has been started on Neurontin and dose was just increased a few weeks ago.  She does not have any tissue loss or ulceration.  She is worried about the discoloration on her skin of both the dorsum of her left foot and anterior left leg.  She has had a mini stroke in the remote past.  She does not have any coronary disease that she knows of.  She does continue to occasionally smoke.  She has an aspirin allergy but does take Plavix.  Past Medical History:  Diagnosis Date  . Allergy   . Anal fissure   . Anxiety   . Cataract   . Chronic headaches   . Depression   . Diverticulosis   . GERD (gastroesophageal reflux disease)   . Glaucoma   . HLD (hyperlipidemia)   . Hypertension   . Hypothyroidism   . PAD (peripheral artery disease) ([place])   . PUD (peptic ulcer disease)   . TIA (transient ischemic attack)    Family History  Problem Relation Age of Onset  . Peripheral vascular disease Mother   . Heart disease Mother   . Hypertension Mother   . AAA (abdominal aortic aneurysm) Father   . Hypertension Father   . Aneurysm Father   . Uterine cancer Sister        mets to brain  . Liver cancer Brother   . Stomach cancer Maternal Aunt        x2  . Stomach cancer Paternal Aunt   . Heart disease Brother     . Kidney cancer Brother   . Anuerysm Brother        brain  . HIV Brother        x2   Past Surgical History:  Procedure Laterality Date  . ABDOMINAL AORTAGRAM N/A 01/20/2013   Procedure: ABDOMINAL Maxcine Ham;  Surgeon: Conrad Cadott, MD;  Location[facility] C[facility] LAB;  Service: Cardiovascular;  Laterality: N/A;  . ANAL FISSURE REPAIR    . AORTOGRAM    . BREAST REDUCTION SURGERY    . CATARACT EXTRACTION Left   . iliac artery angioplasty and stenting Left 12/2012  . SFA stent Left 2012   at [facility]  . STOMACH SURGERY    . TOTAL THYROIDECTOMY      Short Social History:  Social History   Tobacco Use  . Smoking status: Current Some Day Smoker    Packs/day: 0.50    Years: 0.50    Pack years: 0.25    Types: Cigarettes  . Smokeless tobacco: Never Used  . Tobacco comment: 1 pack per 2 days  Substance Use Topics  . Alcohol use: No    Alcohol/week: 0.0 oz  Allergies  Allergen Reactions  . Asa [Aspirin] Nausea And Vomiting  . Crestor [Rosuvastatin Calcium] Itching  . Penicillins Hives and Itching    Has patient had a PCN reaction causing immediate rash, facial/tongue/throat swelling, SOB or lightheadedness with hypotension: Yes Has patient had a PCN reaction causing severe rash involving mucus membranes or skin necrosis: Yes Has patient had a PCN reaction that required hospitalization No Has patient had a PCN reaction occurring within the last 10 years: No If all of the above answers are "NO", then may proceed with Cephalosporin use.   . Gadolinium Derivatives Other (See Comments)    Chest tightness    Current Outpatient Medications  Medication Sig Dispense Refill  . amLODipine (NORVASC) 10 MG tablet Take 1 tablet (10 mg total) by mouth daily. 30 tablet 3  . clopidogrel (PLAVIX) 75 MG tablet Take 1 tablet (75 mg total) by mouth daily. For Peripheral Arterial disease 30 tablet 3  . doxepin (SINEQUAN) 100 MG capsule Take 1 capsule (100 mg total) by mouth at  bedtime. 30 capsule 3  . furosemide (LASIX) 20 MG tablet Take 1 tablet (20 mg total) by mouth daily. 30 tablet 3  . hydrochlorothiazide (HYDRODIURIL) 12.5 MG tablet Take 2 tablets (25 mg total) by mouth daily. 30 tablet 3  . levothyroxine (SYNTHROID, LEVOTHROID) 112 MCG tablet TAKE 1 TABLET (112 MCG TOTAL) BY MOUTH DAILY BEFORE BREAKFAST. 30 tablet 3  . nicotine polacrilex (NICORETTE) 2 MG gum Take 1 each (2 mg total) by mouth as needed for smoking cessation. 100 tablet 0  . pantoprazole (PROTONIX) 40 MG tablet take 1 tablet by mouth twice a day for ACID REFLUX 60 tablet 5  . gabapentin (NEURONTIN) 400 MG capsule Take 1 capsule (400 mg total) by mouth 3 (three) times daily. 90 capsule 1   No current facility-administered medications for this visit.     Review of Systems  Constitutional:  Constitutional negative. Eyes: Eyes negative.  Respiratory: Positive for shortness of breath.  Cardiovascular: Positive for dyspnea with exertion and leg swelling.  GI: Gastrointestinal negative.  Musculoskeletal: Positive for leg pain.  Skin: Positive for rash.  Neurological: Positive for dizziness, focal weakness and numbness.  Hematologic: Positive for bruises/bleeds easily.        Objective:  Objective   Vitals:   01/16/17 1530  BP: (!) 138/97  Pulse: 94  Resp: 18  SpO2: 98%  Weight: 226 lb 4.8 oz (102.6 kg)  Height: 5\' 2"  (1.575 m)   Body mass index is 41.39 kg/m.  Physical Exam  Constitutional: She is oriented to person, place, and time. She appears well-developed.  HENT:  Head: Normocephalic.  Eyes: Pupils are equal, round, and reactive to light.  Neck: Normal range of motion. Neck supple.  Cardiovascular: Normal rate.  Pulses:      Femoral pulses are 2+ on the left side. Pain limits palpating right common femoral artery Strong left peroneal signal and right dp  Pulmonary/Chest: Effort normal.  Abdominal: Soft. She exhibits no mass.  Musculoskeletal: Normal range of motion.    Neurological: She is alert and oriented to person, place, and time.  Skin:  Dark discoloration of distal dorsal left foot  Psychiatric: She has a normal mood and affect. Her behavior is normal. Judgment and thought content normal.    Data: Outside ABIs were reviewed demonstrate PAD.     Assessment/Plan:    67 year old female with bilateral lower extremity burning pain and outside ABIs demonstrating peripheral arterial disease as a diagnosis.  She does have previous stenting on the left external iliac artery and left SFA.  She does not have any tissue loss only skin discoloration at this time and much of her pain seems multifactorial.  I will get her to follow-up with bilateral lower extremity duplexes and ABIs.  She will continue aspirin I have encouraged her to quit smoking entirely.  She has continued pain discoloration we can consider angiogram which would likely need to be with CO2 given previous history of renal dysfunction.  She demonstrates good understanding we will see her in 3 months.     Waynetta Sandy MD [facility]

## 2017-01-19 ENCOUNTER — Other Ambulatory Visit: Payer: Self-pay | Admitting: *Deleted

## 2017-01-19 ENCOUNTER — Other Ambulatory Visit: Payer: Self-pay | Admitting: Internal Medicine

## 2017-01-19 DIAGNOSIS — E039 Hypothyroidism, unspecified: Secondary | ICD-10-CM

## 2017-01-19 MED ORDER — LEVOTHYROXINE SODIUM 112 MCG PO TABS: ORAL_TABLET | ORAL | 0 refills | Status: DC

## 2017-01-19 NOTE — Telephone Encounter (Signed)
Called / informed pt if she's having amy s/s "as lethargy, palpitations, mood changes, anxiety, etc then she should come into acc to be re-evaluated" per Dr Maricela Bo. Stated she has been having these symptoms ie dizziness, rapid heart rate,tiredness but did not know it may be related to her thyroid. [facility] appt scheduled 1/11 (per front office).

## 2017-01-19 NOTE — Telephone Encounter (Signed)
Patient is requesting refills on medicine

## 2017-01-19 NOTE — Telephone Encounter (Signed)
I called and spoke to patient about her symptoms.   Dizziness Increased heart rate even at rest, especially when she eats  Lethargic and sleepy Tiredness for the past 2 months She states that she has gained 25lbs over the past 2 months   Denies chest pain, but states that she has chest tightness only when she is exerting herself  I informed that the patient that it is important for her to be seen prior to 1/11. She stated that she will try to find a ride and come in tomorrow. She mentioned that she will call the clinic.   -If you do not hear from patient, please call patient back to schedule an appointment for her to be seen tomorrow 01/20/17. Thank you!

## 2017-01-19 NOTE — Telephone Encounter (Signed)
Pt informed levothyroxine has been refilled. Pt stated she's still feels tired. She has an appt in March w/PCP.

## 2017-01-19 NOTE — Telephone Encounter (Signed)
Pt informed front office, she can not come tomorrow - appt change to 1/9 in [facility].

## 2017-01-19 NOTE — Addendum Note (Signed)
Addended by: Lianne Cure A on: 01/19/2017 11:52 AM   Modules accepted: Orders

## 2017-01-19 NOTE — Telephone Encounter (Signed)
If the patient is having symptoms such as lethargy, palpitations, mood changes, anxiety, etc then she should come into acc to be re-evaluated. Thank you!

## 2017-01-20 NOTE — Telephone Encounter (Signed)
Ok thank you 

## 2017-01-21 ENCOUNTER — Other Ambulatory Visit: Payer: Self-pay

## 2017-01-21 ENCOUNTER — Ambulatory Visit (INDEPENDENT_AMBULATORY_CARE_PROVIDER_SITE_OTHER): Payer: Medicare HMO | Admitting: Internal Medicine

## 2017-01-21 ENCOUNTER — Ambulatory Visit (HOSPITAL_COMMUNITY)
Admission: RE | Admit: 2017-01-21 | Discharge: 2017-01-21 | Disposition: A | Discharge status: Home / Self Care | Payer: Medicare HMO | Source: Ambulatory Visit | Attending: Internal Medicine | Admitting: Internal Medicine

## 2017-01-21 VITALS — BP 152/84 | HR 90 | Temp 97.8°F | Wt 225.0 lb

## 2017-01-21 DIAGNOSIS — M62838 Other muscle spasm: Secondary | ICD-10-CM | POA: Diagnosis not present

## 2017-01-21 DIAGNOSIS — F332 Major depressive disorder, recurrent severe without psychotic features: Secondary | ICD-10-CM

## 2017-01-21 DIAGNOSIS — R002 Palpitations: Secondary | ICD-10-CM | POA: Insufficient documentation

## 2017-01-21 DIAGNOSIS — E039 Hypothyroidism, unspecified: Secondary | ICD-10-CM | POA: Diagnosis not present

## 2017-01-21 DIAGNOSIS — F339 Major depressive disorder, recurrent, unspecified: Secondary | ICD-10-CM | POA: Diagnosis not present

## 2017-01-21 DIAGNOSIS — Z79899 Other long term (current) drug therapy: Secondary | ICD-10-CM | POA: Diagnosis not present

## 2017-01-21 DIAGNOSIS — I498 Other specified cardiac arrhythmias: Secondary | ICD-10-CM | POA: Insufficient documentation

## 2017-01-21 DIAGNOSIS — R42 Dizziness and giddiness: Secondary | ICD-10-CM

## 2017-01-21 DIAGNOSIS — G8929 Other chronic pain: Secondary | ICD-10-CM | POA: Diagnosis not present

## 2017-01-21 DIAGNOSIS — R6889 Other general symptoms and signs: Secondary | ICD-10-CM | POA: Diagnosis not present

## 2017-01-21 DIAGNOSIS — F411 Generalized anxiety disorder: Secondary | ICD-10-CM

## 2017-01-21 MED ORDER — DOXEPIN HCL 150 MG PO CAPS: 150.0000 mg | ORAL_CAPSULE | Freq: Every day | ORAL | 1 refills | Status: DC

## 2017-01-21 NOTE — Progress Notes (Signed)
   CC: Fatigue, dizziness, palpitations  HPI:  [name] is a 67 y.o. female with a past medical history as described below who presents to the clinic with multiple vague complaints.  Patient notes multiple ongoing issues that have been present for the last 2-3 months. She notes feeling sluggish and tired along with erratic sleep and not feeling restored upon waking in the morning.  She also notes intermittent periods of dizziness or losing balance when standing or walking.  She states it feels as if she is about to pass out, last one-2 minutes and improves with sitting or standing still.  She notes 4-5 brief episodes of sensation of her heart racing over the last few months.  This last occurred at an appointment with her vascular surgeon on 1/4.  She also notes that she has been feeling increased grief and sadness around the holiday season recently. Also reports issues with her chronic pain and muscle spasm.   In November 2018, her symptoms were attributed to untreated hypothyroidism as she had not been on her medications for an extended period and TSH was found to be 50.  Subsequent TSH once on medication was 3.03.  Past Medical History:  Diagnosis Date  . Allergy   . Anal fissure   . Anxiety   . Cataract   . Chronic headaches   . Depression   . Diverticulosis   . GERD (gastroesophageal reflux disease)   . Glaucoma   . HLD (hyperlipidemia)   . Hypertension   . Hypothyroidism   . PAD (peripheral artery disease) ([place])   . PUD (peptic ulcer disease)   . TIA (transient ischemic attack)    Review of Systems:  Review of Systems  Constitutional: Positive for malaise/fatigue.  Cardiovascular: Positive for palpitations.  Psychiatric/Behavioral: Positive for depression. Negative for suicidal ideas.     Physical Exam:  Vitals:   01/21/17 1310  BP: (!) 152/84  Pulse: 90  Temp: 97.8 F (36.6 C)  TempSrc: Oral  SpO2: 100%  Weight: 225 lb (102.1 kg)   General: Sitting in  chair comfortably, no acute distress CV: RRR, no murmur appreciated Resp: Clear breath sounds bilaterally, normal work of breathing, no distress  Abd: Soft, +BS, non-tender to palpation  Neuro: Alert and oriented x3 Skin: Warm, dry      Assessment & Plan:   See Encounters Tab for problem based charting.  Patient discussed with Dr. Dareen Piano

## 2017-01-21 NOTE — Patient Instructions (Addendum)
FOLLOW-UP INSTRUCTIONS When: About 1 month For: F/u of depression/fatigue, palpitations What to bring: Medicines   It was nice to meet you Ms. Remmers.   On your EKG, your heart rate looked good and your heart was in a normal rhythm. Continue to keep a close eye on this and let us know if this begins to occur more frequently.   For your fatigue and other symptoms, we think a factor may be your mood and the grief you are feeling as we commonly see many symptoms like fatigue and sluggishness happen in people who are having depression. Also, the dose of doxepin you are on may not be effective enough to adequately treat your depression, so we are going to increase the dose. We would like to increase the treatment of your depression to see if this will help you feel better in multiple areas.   We will have you come back to check in with [name] in about a month to see how you are doing with these symptoms and see if any other workup needs to be done.

## 2017-01-22 NOTE — Assessment & Plan Note (Signed)
Patient reports infrequent, brief episodes of palpitation sensation which resolved quickly with sitting.  EKG performed today showed normal sinus rhythm without signs of underlying predisposition for tachyarrhythmias.  We will continue to monitor the symptoms and consider monitoring or other workup as indicated

## 2017-01-22 NOTE — Progress Notes (Signed)
Internal Medicine Clinic Attending  Case discussed with Dr. Harden at the time of the visit.  We reviewed the resident's history and exam and pertinent patient test results.  I agree with the assessment, diagnosis, and plan of care documented in the resident's note.  

## 2017-01-22 NOTE — Assessment & Plan Note (Addendum)
Many of the patient's complaints may be explained by depression including her increased sadness, fatigue, sleep issues, worsening of chronic pain.  PHQ-9 performed today was 12, fairly consistent with prior evaluations.  She is currently on doxepin 100 mg.  At least one source found on review recommends at least 150 mg as an ideal dose for depression with max dose of 300 mg.  She is previously been on an SSRI (sertraline) but did not tolerate it well.  We will attempt to improve her depression treatment in case it is currently undertreated and causing her symptoms.  Will increase doxepin dose, though may be beneficial to add or switch to an SSRI as more of a first-line agent and use an alternative to sertraline.  Other options if her symptoms persist at follow-up would be a sleep study as OSA is another potential etiology.  She reports having a sleep study in the past and reports she had an "overactive self-conscious" but did not have respiratory issues.  Did not find records of a sleep study in our system on chart review. Repeat TSH could also be considered though normal when last checked and no changes in interval.   --Increase doxepin to 150 mg  --F/u in 1 month to reassess sx and make medication adjustments or pursue further workup

## 2017-01-23 ENCOUNTER — Ambulatory Visit: Payer: Self-pay

## 2017-03-05 DIAGNOSIS — R197 Diarrhea, unspecified: Secondary | ICD-10-CM | POA: Diagnosis not present

## 2017-03-05 DIAGNOSIS — R079 Chest pain, unspecified: Secondary | ICD-10-CM | POA: Diagnosis not present

## 2017-03-05 DIAGNOSIS — E890 Postprocedural hypothyroidism: Secondary | ICD-10-CM | POA: Diagnosis not present

## 2017-03-05 DIAGNOSIS — B370 Candidal stomatitis: Secondary | ICD-10-CM | POA: Diagnosis not present

## 2017-03-05 DIAGNOSIS — F322 Major depressive disorder, single episode, severe without psychotic features: Secondary | ICD-10-CM | POA: Diagnosis not present

## 2017-03-05 DIAGNOSIS — R200 Anesthesia of skin: Secondary | ICD-10-CM | POA: Diagnosis not present

## 2017-03-05 DIAGNOSIS — E1110 Type 2 diabetes mellitus with ketoacidosis without coma: Secondary | ICD-10-CM | POA: Diagnosis not present

## 2017-03-05 DIAGNOSIS — Z452 Encounter for adjustment and management of vascular access device: Secondary | ICD-10-CM | POA: Diagnosis not present

## 2017-03-05 DIAGNOSIS — R1030 Lower abdominal pain, unspecified: Secondary | ICD-10-CM | POA: Diagnosis not present

## 2017-03-05 DIAGNOSIS — N390 Urinary tract infection, site not specified: Secondary | ICD-10-CM | POA: Diagnosis not present

## 2017-03-05 DIAGNOSIS — E1151 Type 2 diabetes mellitus with diabetic peripheral angiopathy without gangrene: Secondary | ICD-10-CM | POA: Diagnosis not present

## 2017-03-05 DIAGNOSIS — R112 Nausea with vomiting, unspecified: Secondary | ICD-10-CM | POA: Diagnosis not present

## 2017-03-05 DIAGNOSIS — E1100 Type 2 diabetes mellitus with hyperosmolarity without nonketotic hyperglycemic-hyperosmolar coma (NKHHC): Secondary | ICD-10-CM | POA: Diagnosis not present

## 2017-03-05 DIAGNOSIS — K5732 Diverticulitis of large intestine without perforation or abscess without bleeding: Secondary | ICD-10-CM | POA: Diagnosis not present

## 2017-03-05 DIAGNOSIS — I10 Essential (primary) hypertension: Secondary | ICD-10-CM | POA: Diagnosis not present

## 2017-03-05 DIAGNOSIS — R000 Tachycardia, unspecified: Secondary | ICD-10-CM | POA: Diagnosis not present

## 2017-03-05 DIAGNOSIS — R739 Hyperglycemia, unspecified: Secondary | ICD-10-CM | POA: Diagnosis not present

## 2017-03-09 ENCOUNTER — Telehealth: Payer: Self-pay | Admitting: Dietician

## 2017-03-09 NOTE — Telephone Encounter (Signed)
Asked to call Ms April Lanny Hurst back about this patient who is being discharged on insulin. She has not previous diagnosis of diabetes on her chart and has an appointment here tomorrow. Left message for return call.

## 2017-03-10 ENCOUNTER — Ambulatory Visit: Payer: Self-pay

## 2017-03-10 ENCOUNTER — Encounter: Payer: Self-pay | Admitting: Dietician

## 2017-03-10 ENCOUNTER — Ambulatory Visit (INDEPENDENT_AMBULATORY_CARE_PROVIDER_SITE_OTHER): Payer: Medicare HMO | Admitting: Internal Medicine

## 2017-03-10 ENCOUNTER — Ambulatory Visit: Payer: Medicare HMO | Admitting: Dietician

## 2017-03-10 VITALS — BP 121/76 | HR 80 | Temp 98.2°F | Wt 210.9 lb

## 2017-03-10 DIAGNOSIS — I10 Essential (primary) hypertension: Secondary | ICD-10-CM | POA: Diagnosis not present

## 2017-03-10 DIAGNOSIS — E118 Type 2 diabetes mellitus with unspecified complications: Secondary | ICD-10-CM

## 2017-03-10 DIAGNOSIS — Z8673 Personal history of transient ischemic attack (TIA), and cerebral infarction without residual deficits: Secondary | ICD-10-CM

## 2017-03-10 DIAGNOSIS — E039 Hypothyroidism, unspecified: Secondary | ICD-10-CM

## 2017-03-10 DIAGNOSIS — Z794 Long term (current) use of insulin: Secondary | ICD-10-CM | POA: Diagnosis not present

## 2017-03-10 DIAGNOSIS — B370 Candidal stomatitis: Secondary | ICD-10-CM

## 2017-03-10 DIAGNOSIS — E1169 Type 2 diabetes mellitus with other specified complication: Secondary | ICD-10-CM

## 2017-03-10 DIAGNOSIS — E1151 Type 2 diabetes mellitus with diabetic peripheral angiopathy without gangrene: Secondary | ICD-10-CM

## 2017-03-10 DIAGNOSIS — K5792 Diverticulitis of intestine, part unspecified, without perforation or abscess without bleeding: Secondary | ICD-10-CM

## 2017-03-10 DIAGNOSIS — B379 Candidiasis, unspecified: Secondary | ICD-10-CM | POA: Diagnosis not present

## 2017-03-10 DIAGNOSIS — E669 Obesity, unspecified: Secondary | ICD-10-CM

## 2017-03-10 LAB — POCT GLYCOSYLATED HEMOGLOBIN (HGB A1C): Hemoglobin A1C: >14

## 2017-03-10 LAB — GLUCOSE, CAPILLARY: GLUCOSE-CAPILLARY: 143 mg/dL — AB (ref 65–99)

## 2017-03-10 NOTE — Progress Notes (Signed)
CC: Follow-up for diabetes mellitus  HPI:  [name] is a 67 y.o. female with hypertension, peripheral artery disease, hypothyroidism, history of TIA presents for hospital follow-up during which she was diagnosed with diabetes.Please see problem based charting for evaluation, assessment, and plan.  Past Medical History:  Diagnosis Date  . Allergy   . Anal fissure   . Anxiety   . Cataract   . Chronic headaches   . Depression   . Diverticulosis   . GERD (gastroesophageal reflux disease)   . Glaucoma   . HLD (hyperlipidemia)   . Hypertension   . Hypothyroidism   . PAD (peripheral artery disease) ([place])   . PUD (peptic ulcer disease)   . TIA (transient ischemic attack)    Review of Systems: Weakness, polyuria, dry mouth, weight loss, neuropathic pain  Physical Exam:  Vitals:   03/10/17 0903  BP: 121/76  Pulse: 80  Temp: 98.2 F (36.8 C)  TempSrc: Oral  SpO2: 100%  Weight: 210 lb 14.4 oz (95.7 kg)   Physical Exam  Constitutional: She appears well-developed and well-nourished. No distress.  HENT:  Head: Normocephalic and atraumatic.  Eyes: Conjunctivae are normal.  Cardiovascular: Normal rate, regular rhythm and normal heart sounds.  Respiratory: Effort normal and breath sounds normal. No respiratory distress. She has no wheezes.  GI: Soft. Bowel sounds are normal. She exhibits no distension. There is no tenderness.  Musculoskeletal: She exhibits no edema.  Neurological: She is alert.  Skin: She is not diaphoretic.  Psychiatric: She has a normal mood and affect. Her behavior is normal. Judgment and thought content normal.    Assessment & Plan:   See Encounters Tab for problem based charting.  Diabetes mellitus type 2 The patient was hospitalized at [facility] from 03/05/17 to 03/09/17.  She states that she was feeling weak, having polyuria, mouth dryness, decreased appetite, generalized abdominal pain that.  Have been increasingly worsening  for the past 3 months. She states that she also had a feeling of "having a stroke"prompted [facility].  During her hospitalization the patient states that she was diagnosed with diverticulitis, thrush, diabetes mellitus.  The patient brought her after visit summary, laboratory results, and medication list, but no description of hospital course was provided.  Records have not been received by the [facility] at the time of the patient's clinic visit.  The patient had an A1c of 16.5 noted on lab results from [facility].  The patient was started on Lantus 20 units nightly and Humulin 5 units 3 times daily.  The patient states that she has been taking Lantus daily but has not been taking Humulin as she has not been able to afford the medication.   Assessment Since the patient's records have not been received from [facility] will not make any medical changes at this time.  Would like to maximize on non-insulin diabetes medication if the patient did not have an episode of DKA during hospitalization.  Plan -Instructed the patient to continue Lantus 20 units nightly at this time  -Follow-up within 2 weeks -BMP ordered  Diverticulitis The patient was diagnosed with diverticulitis at [facility] and was treated with antibiotics.  She was told to continue metronidazole and levofloxacin.  The patient is no longer having any abdominal adequately with antibiotics during hospitalization we instructed the patient to stop using metronidazole and levofloxacin.  Thrush The patient was diagnosed with thrush at [facility]  medical center during hospitalization from 03/05/17-03/09/17.  -Continue fluconazole as prescribed on discharge  Patient seen with Dr. Evette Doffing

## 2017-03-10 NOTE — Patient Instructions (Addendum)
It was a pleasure to see you today [name]. Please make the following changes:  -Please continue taking lantus 20units daily at night time and we will adjust your diabetes medications according to your blood work results -Please stop taking metronidazole and ciprofloxacin -Continue taking all remaining medications as prescribed   If you have any questions or concerns, please call our clinic at [phone] between 9am-5pm and after hours call [phone] and ask for the internal medicine resident on call. If you feel you are having a medical emergency please call 911.   Thank you, we look forward to help you remain healthy!  Lars Mage, MD Internal Medicine PGY1   Diabetes Mellitus and Nutrition When you have diabetes (diabetes mellitus), it is very important to have healthy eating habits because your blood sugar (glucose) levels are greatly affected by what you eat and drink. Eating healthy foods in the appropriate amounts, at about the same times every day, can help you:  Control your blood glucose.  Lower your risk of heart disease.  Improve your blood pressure.  Reach or maintain a healthy weight.  Every person with diabetes is different, and each person has different needs for a meal plan. Your health care provider may recommend that you work with a diet and nutrition specialist (dietitian) to make a meal plan that is best for you. Your meal plan may vary depending on factors such as:  The calories you need.  The medicines you take.  Your weight.  Your blood glucose, blood pressure, and cholesterol levels.  Your activity level.  Other health conditions you have, such as heart or kidney disease.  How do carbohydrates affect me? Carbohydrates affect your blood glucose level more than any other type of food. Eating carbohydrates naturally increases the amount of glucose in your blood. Carbohydrate counting is a method for keeping track of how many carbohydrates you  eat. Counting carbohydrates is important to keep your blood glucose at a healthy level, especially if you use insulin or take certain oral diabetes medicines. It is important to know how many carbohydrates you can safely have in each meal. This is different for every person. Your dietitian can help you calculate how many carbohydrates you should have at each meal and for snack. Foods that contain carbohydrates include:  Bread, cereal, rice, pasta, and crackers.  Potatoes and corn.  Peas, beans, and lentils.  Milk and yogurt.  Fruit and juice.  Desserts, such as cakes, cookies, ice cream, and candy.  How does alcohol affect me? Alcohol can cause a sudden decrease in blood glucose (hypoglycemia), especially if you use insulin or take certain oral diabetes medicines. Hypoglycemia can be a life-threatening condition. Symptoms of hypoglycemia (sleepiness, dizziness, and confusion) are similar to symptoms of having too much alcohol. If your health care provider says that alcohol is safe for you, follow these guidelines:  Limit alcohol intake to no more than 1 drink per day for nonpregnant women and 2 drinks per day for men. One drink equals 12 oz of beer, 5 oz of wine, or 1 oz of hard liquor.  Do not drink on an empty stomach.  Keep yourself hydrated with water, diet soda, or unsweetened iced tea.  Keep in mind that regular soda, juice, and other mixers may contain a lot of sugar and must be counted as carbohydrates.  What are tips for following this plan? Reading food labels  Start by checking the serving size on the label. The amount of calories, carbohydrates,  fats, and other nutrients listed on the label are based on one serving of the food. Many foods contain more than one serving per package.  Check the total grams (g) of carbohydrates in one serving. You can calculate the number of servings of carbohydrates in one serving by dividing the total carbohydrates by 15. For example, if a  food has 30 g of total carbohydrates, it would be equal to 2 servings of carbohydrates.  Check the number of grams (g) of saturated and trans fats in one serving. Choose foods that have low or no amount of these fats.  Check the number of milligrams (mg) of sodium in one serving. Most people should limit total sodium intake to less than 2,300 mg per day.  Always check the nutrition information of foods labeled as "low-fat" or "nonfat". These foods may be higher in added sugar or refined carbohydrates and should be avoided.  Talk to your dietitian to identify your daily goals for nutrients listed on the label. Shopping  Avoid buying canned, premade, or processed foods. These foods tend to be high in fat, sodium, and added sugar.  Shop around the outside edge of the grocery store. This includes fresh fruits and vegetables, bulk grains, fresh meats, and fresh dairy. Cooking  Use low-heat cooking methods, such as baking, instead of high-heat cooking methods like deep frying.  Cook using healthy oils, such as olive, canola, or sunflower oil.  Avoid cooking with butter, cream, or high-fat meats. Meal planning  Eat meals and snacks regularly, preferably at the same times every day. Avoid going long periods of time without eating.  Eat foods high in fiber, such as fresh fruits, vegetables, beans, and whole grains. Talk to your dietitian about how many servings of carbohydrates you can eat at each meal.  Eat 4-6 ounces of lean protein each day, such as lean meat, chicken, fish, eggs, or tofu. 1 ounce is equal to 1 ounce of meat, chicken, or fish, 1 egg, or 1/4 cup of tofu.  Eat some foods each day that contain healthy fats, such as avocado, nuts, seeds, and fish. Lifestyle   Check your blood glucose regularly.  Exercise at least 30 minutes 5 or more days each week, or as told by your health care provider.  Take medicines as told by your health care provider.  Do not use any products  that contain nicotine or tobacco, such as cigarettes and e-cigarettes. If you need help quitting, ask your health care provider.  Work with a Social worker or diabetes educator to identify strategies to manage stress and any emotional and social challenges. What are some questions to ask my health care provider?  Do I need to meet with a diabetes educator?  Do I need to meet with a dietitian?  What number can I call if I have questions?  When are the best times to check my blood glucose? Where to find more information:  American Diabetes Association: diabetes.org/food-and-fitness/food  Academy of Nutrition and Dietetics: PokerClues.dk  [employer] of Diabetes and Digestive and Kidney Diseases (NIH): ContactWire.be Summary  A healthy meal plan will help you control your blood glucose and maintain a healthy lifestyle.  Working with a diet and nutrition specialist (dietitian) can help you make a meal plan that is best for you.  Keep in mind that carbohydrates and alcohol have immediate effects on your blood glucose levels. It is important to count carbohydrates and to use alcohol carefully. This information is not intended to replace advice  given to you by your health care provider. Make sure you discuss any questions you have with your health care provider. Document Released: 09/26/2004 Document Revised: 02/04/2016 Document Reviewed: 02/04/2016 Elsevier Interactive Patient Education  [employer].

## 2017-03-10 NOTE — Progress Notes (Signed)
Met with patient briefly, provided Living well with Type 2 diabetes and my contact information. She has many questions for the doctors today. Recommend a referral for diabetes self management training and that she schedule an appointment at a later time. Butch Penny Traci Plemons, RD 03/10/2017 10:09 AM.

## 2017-03-11 ENCOUNTER — Other Ambulatory Visit: Payer: Self-pay

## 2017-03-11 DIAGNOSIS — E1151 Type 2 diabetes mellitus with diabetic peripheral angiopathy without gangrene: Secondary | ICD-10-CM | POA: Insufficient documentation

## 2017-03-11 DIAGNOSIS — K5792 Diverticulitis of intestine, part unspecified, without perforation or abscess without bleeding: Secondary | ICD-10-CM | POA: Insufficient documentation

## 2017-03-11 DIAGNOSIS — B370 Candidal stomatitis: Secondary | ICD-10-CM | POA: Insufficient documentation

## 2017-03-11 LAB — BMP8+ANION GAP
ANION GAP: 17 mmol/L (ref 10.0–18.0)
BUN/Creatinine Ratio: 4 — ABNORMAL LOW (ref 12–28)
BUN: 4 mg/dL — ABNORMAL LOW (ref 8–27)
CHLORIDE: 107 mmol/L — AB (ref 96–106)
CO2: 20 mmol/L (ref 20–29)
Calcium: 8.7 mg/dL (ref 8.7–10.3)
Creatinine, Ser: 1 mg/dL (ref 0.57–1.00)
GFR calc Af Amer: 67 mL/min/{1.73_m2} (ref >59)
GFR calc non Af Amer: 58 mL/min/{1.73_m2} — ABNORMAL LOW (ref >59)
GLUCOSE: 129 mg/dL — AB (ref 65–99)
POTASSIUM: 4.3 mmol/L (ref 3.5–5.2)
Sodium: 144 mmol/L (ref 134–144)

## 2017-03-11 NOTE — Assessment & Plan Note (Signed)
The patient was diagnosed with diverticulitis at [facility] and was treated with antibiotics.  She was told to continue metronidazole and levofloxacin.  The patient is no longer having any abdominal adequately with antibiotics during hospitalization we instructed the patient to stop using metronidazole and levofloxacin.

## 2017-03-11 NOTE — Assessment & Plan Note (Addendum)
The patient was hospitalized at [facility] from 03/05/17 to 03/09/17.  She states that she was feeling weak, having polyuria, mouth dryness, decreased appetite, generalized abdominal pain that.  Have been increasingly worsening for the past 3 months. She states that she also had a feeling of "having a stroke"prompted her to go to [facility].  During her hospitalization the patient states that she was diagnosed with diverticulitis, thrush, diabetes mellitus.  The patient brought her after visit summary, laboratory results, and medication list, but no description of hospital course was provided.  Records have not been received by the [facility] at the time of the patient's clinic visit.  The patient had an A1c of 16.5 noted on lab results from [facility].  The patient was started on Lantus 20 units nightly and Humulin 5 units 3 times daily.  The patient states that she has been taking Lantus daily but has not been taking Humulin as she has not been able to afford the medication.   Assessment Since the patient's records have not been received from [facility] will not make any medical changes at this time.  Would like to maximize on non-insulin diabetes medication if the patient did not have an episode of DKA during hospitalization.  Plan -Instructed the patient to continue Lantus 20 units nightly at this time  -Follow-up within 2 weeks -BMP ordered

## 2017-03-11 NOTE — Patient Outreach (Signed)
[facility]) Care Management  03/11/2017  [name] 04-18-1950 [id number]   Referral Date: 03/11/17 Referral Source: [employer] Date of Admission: 03/05/17 Diagnosis: Diabetes Date of Discharge: 03/09/17 Facility: [place]: [place] attempt # 1 spoke with patient. She is able to verify HIPAA. Patient acknowledges being in the hospital for new diabetes diagnosis.  Patient reports that she was in [place] area  to watch her grandchildren and fell ill.  Her A1c is 16.5.  Patient reports she is having trouble with affording medication, food for her new diagnosis, and checking her blood sugar.  Patient did meet with the nutritionist at the [place] office but just feels overwhelmed with it all.    Social: Patient lives alone in a rooming house.  Patient has a friend that she depends on but that friend works and sometimes cannot depend on.   Conditions: Patient reports she has multiple illnesses and has al lot going on.  She admits to new diabetes diagnosis, HTN, PAD, and hypothyroidism.    Medications: Patient takes her medications but unable to afford Humulin which her has held off on for now.    Appointments: Patient saw primary doctor on yesterday.  Patient has problems with transportation and needs transportation resources.    Consent: RN CM reviewed [facility] services with patient. Patient gave verbal consent for services.  Plan: RN CM will refer to community nurse for transition of care, education, and support for new diabetes diagnosis.   RN CM will refer to social worker to assist with food resources and transportation resources. RN CM will refer to pharmacy for patient assistance with medications.     Jone Baseman, RN, MSN [facility] Care Management Care Management Coordinator Direct Line [phone] Toll Free: [phone]  Fax: [phone]

## 2017-03-11 NOTE — Progress Notes (Signed)
Internal Medicine Clinic Attending  I saw and evaluated the patient.  I personally confirmed the key portions of the history and exam documented by [name] and I reviewed pertinent patient test results.  The assessment, diagnosis, and plan were formulated together and I agree with the documentation in the resident's note.  Unfortunately we did not have full records from [place] about the recent hospital course. We did confirm the diagnosis of DM by repeating an A1c. Renal function is better than historical values, with GFR around 50. I would still avoid metformin, given her history of GFR <45. I think continuing Lantus is fine, but I would add a GLP1 agonist injectable at this time given history of peripheral artery disease. We can then work on transitioning to an insulin-sparing regimen in future visits.

## 2017-03-11 NOTE — Assessment & Plan Note (Signed)
The patient was diagnosed with thrush at [facility] center during hospitalization from 03/05/17-03/09/17.  -Continue fluconazole as prescribed on discharge

## 2017-03-12 ENCOUNTER — Other Ambulatory Visit: Payer: Self-pay | Admitting: *Deleted

## 2017-03-12 ENCOUNTER — Encounter: Payer: Self-pay | Admitting: *Deleted

## 2017-03-12 ENCOUNTER — Other Ambulatory Visit: Payer: Self-pay | Admitting: Internal Medicine

## 2017-03-12 DIAGNOSIS — E039 Hypothyroidism, unspecified: Secondary | ICD-10-CM

## 2017-03-12 NOTE — Patient Outreach (Signed)
Request received from Joanna Saporito, LCSW to mail patient personal care resources.  Information mailed today. 

## 2017-03-12 NOTE — Patient Outreach (Signed)
[facility]) Care Management  03/12/2017  [name] 13-May-1950 [id number]   CSW was able to make initial contact with patient today to perform phone assessment, as well as assess and assist with social work needs and services.  CSW introduced self, explained role and types of services provided through [place] [facility] ([place] [facility]).  CSW further explained to patient that CSW works with patient's Telephonic RNCM, also with [place] [facility], Dionne Leath. CSW then explained the reason for the call, indicating that Mrs. [name] thought that patient would benefit from social work services and resources to assist with obtaining transportation to and from physician appointments, as well as with obtaining a list of food resources.  CSW obtained two HIPAA compliant identifiers from patient, which included patient's name and date of birth. Patient reported that she is already utilizing [employer] to get to and from her physician appointments, but that she only has a few rides left for this fiscal year.  Patient was agreeable to having CSW complete a [employer] Par[employer]ic) application on her behalf and submit directly to [employer]) for processing.  CSW agreed to complete the application and fax in for processing today.  CSW explained to patient that she should be receiving a call from a representative with [employer] within the next two weeks, as they will want to perform a phone assessment, as well as an initial visit to determine eligibility. CSW also spoke with patient about transportation services through [employer] with [employer] [place].  Patient expressed an interest in receiving services through this free, volunteer-based program.  CSW agreed to contact [employer] to get patient set up in their data base, simply by providing basic demographic information.  Patient  is aware that she will need to contact [employer] at least one week in advance for all transportation requests.  Patient reported that she will continue to use [employer] for all upcoming appointments, until she has exhausted this resource. CSW spoke with patient about food resources through [employer] [place] in [place], as well as [employer] [place] in [place], agreeing to mail patient a copy of each.  CSW has submitted a request to Kennyth Lose, Care Management Assistant with [facility] to mail patient copies of the books.  CSW agreed to follow-up with patient within the next two weeks to ensure that patient received the packet of resources mailed to her home, as well as assess need for continued social work involvement.  Patient voiced understanding and was agreeable to this plan. THN CM Care Plan Problem One     Most Recent Value  Care Plan Problem One  Lack of transportation to and from physician appointments.  Role Documenting the Problem One  Clinical Social Worker  Care Plan for Problem One  Active  [facility] Long Term Goal   Patient will have secure transportation arrangements in place, within the next 45 days, through [employer] and [employer].  [facility]N Long Term Goal Start Date  03/12/17  Interventions for Problem One Long Term Goal  CSW will assist patient with referral to [employer] and [employer].   THN CM Short Term Goal #1   Patient will contact a representative with [employer], within the next two weeks, if she has not received a call regarding application submission.  THN CM Short Term Goal #  1 Start Date  03/12/17  Interventions for Short Term Goal #1  Patient has been given the contact number for [employer] and the name of a representative to contact regarding her [employer]AT application.  THN CM Short Term Goal #2   Patient will contact [employer] through [employer] [place], within the next 30 days, when transportation is needed to get to and from her physician appointments.  THN CM Short Term Goal #2 Start Date  03/12/17  Interventions for Short Term Goal #2  Patient has been given a contact number and name for a representative with [employer].    THN CM Care Plan Problem Two     Most Recent Value  Care Plan Problem Two  Lack of food resources.  Role Documenting the Problem Two  Clinical Social Worker  Care Plan for Problem Two  Active  THN CM Short Term Goal #1   Patient will utilize [employer] [place] and [employer] to obtain food, within the next 30 days.  THN CM Short Term Goal #1 Start Date  03/12/17  Interventions for Short Term Goal #2   CSW will mail patient a copy of [employer] [place] and [employer] [place].     Nat Christen, BSW, MSW, LCSW  Licensed Education officer, [facility]  Mailing [place] [address] [place], [place] [address] Physical Address-[address], [place], [place] [address] Toll Free Main # [phone] Fax # [phone] Cell # [phone]  Office # [phone] [name]

## 2017-03-13 ENCOUNTER — Telehealth: Payer: Self-pay | Admitting: *Deleted

## 2017-03-13 ENCOUNTER — Encounter: Payer: Self-pay | Admitting: Internal Medicine

## 2017-03-13 ENCOUNTER — Ambulatory Visit: Payer: Self-pay | Admitting: *Deleted

## 2017-03-13 ENCOUNTER — Other Ambulatory Visit: Payer: Self-pay | Admitting: *Deleted

## 2017-03-13 ENCOUNTER — Other Ambulatory Visit: Payer: Self-pay | Admitting: Pharmacist

## 2017-03-13 NOTE — Telephone Encounter (Signed)
Spoke with Dr Maricela Bo regarding pt's insulin regimen after receiving call from Harlow Asa, PharmD, [place] Pharmacist with [facility] [phone] (see 03/13/2017 telephone note).  At this time,  patient is take Lantus 20 units at bedtime and NOT Novolin R.  Pt was contacted and verbalized understanding, she was also instructed to check blood glucose two times daily (fasting am and one other time prior to lunch, dinner or bed).  Pt also requested to meet with our diabetic nutritionist and was scheduled for Wed 03//06/19 @ 2:15pm.  No further action required at this time, phone call complete.[name]3/1/20194:45 PM

## 2017-03-13 NOTE — Patient Outreach (Signed)
[facility]) Care Management  03/13/2017  [name] 05-Apr-1950 [id number]   Receive a call back from [facility] with [facility] Internal Medicine. [name] reports that per [name], for the patient's insulin regimen, the patient is to continue only the Lantus, injecting 20 units nightly at bedtime as previously directed; patient is not to start Novolin N at this time. Reports that [name] wants the patient to test twice daily. [name] states that she will call now to follow up with the patient about this insulin regimen and testing schedule.  Harlow Asa, PharmD, [place] [facility] [phone]

## 2017-03-13 NOTE — Patient Outreach (Addendum)
[facility]) Care Management  03/13/2017  [name] 06-09-1950 [id number]   Call patient's [facility] to confirm dose of Novolin N prescription that was picked up today by [name]. Pharmacist reports that this Novolin N prescription was written for patient to Inject 5 units three times daily with meals.   Call back now and let [name] in Dr. Thea Gist office know that the prescription for Novolin N that the patient picked up from [place] today reads Inject 5 units three times daily with meals, rather than 20 units three times daily with meals as [name] had stated.  Harlow Asa, PharmD, [place] [facility] [phone]

## 2017-03-13 NOTE — Patient Outreach (Signed)
[facility]) Care Management  03/13/2017  [name] 1950/02/15 [id number]   Call to follow up with patient's PCP now for medication reconciliation of patient's insulin regimen. Call and speak with [name], nurse with Dr, Maricela Bo. Let [name] know that patient picked up prescriptions for Lantus and for Novolin R insulins from her pharmacy today. Note that per patient's Epic medication list, Lantus is on patient's list, but Novolin R is not. Per most recent note in Epic from [name], provider wrote that her plan for the patient was "to continue Lantus 20 units nightly at this time and follow-up within 2 weeks". Let [name] know that [name] reports that her Novolin R prescription is written for her to inject 20 units three times daily with meals. Request confirmation of what insulin regimen the provider wants the patient to take.   [name] reports that [name] is currently in the office and that she will follow up with her now, call the patient back to let her know how to proceed and will then call to follow up with me as well. I also ask that [name] let patient know when the provider wants her to check her blood sugar and when her next follow up visit will be.  Harlow Asa, PharmD, [place] [facility] [phone]

## 2017-03-13 NOTE — Patient Outreach (Addendum)
[facility]) Care Management  03/13/2017  LLUVIA GWYNNE [birth year] [id number]   Incoming call back from Ms. Scantlebury. HIPAA identifiers verified and verbal consent received.  Per referral, patient was recently hospitalized at [facility] and diagnosed with type 2 diabetes. Ms. Mersman reports that she went to her [place] today and had two prescriptions for insulin filled, for Lantus and for Novolin R. Patient reports that her cost for each insulin was $16 and $8 respectively. Reports that this price was lower than she thought it would be and reports that she is concerned about the price being higher next time. Look up patient's extra help status on the Medicare.gov website and confirm that patient has the full extra help subsidy. Counsel patient about extra help.  Note that per patient's Epic medication list, Lantus is on patient's list, but Novolin R is not. Per most recent note in Epic from patient's PCP from 03/10/17, provider noted that from [facility], the "patient was started on Lantus 20 units nightly and Humulin 5 units 3 times daily.  The patient states that she has been taking Lantus daily but has not been taking Humulin as she has not been able to afford the medication." [name] wrote that her plan for the patient was "to continue Lantus 20 units nightly at this time and follow-up within 2 weeks".   Patient reports that her Novolin R prescription that she picked up today was written for the patient to take 20 units three times daily with meals. Patient states that this prescription was written by a provider at [facility], but that she thought that [name] wanted her to start taking this insulin as well. Patient reports that she has not yet started taking this insulin. Let patient know that I am going to call Dr. Thea Gist office right now to follow up about whether she is to take the Novolin R and that I will call her back  today as soon as I have an answer. Patient confirms that she will be home for this call. Patient also expresses confusion about when she is to test her blood sugar and about when she is to have her follow up appointment with [name].  PLAN  Will call to follow up with patient's PCP now for medication reconciliation of patient's insulin regimen and then call patient back.  Harlow Asa, PharmD, [place] [facility] [phone]

## 2017-03-13 NOTE — Patient Outreach (Signed)
[place] [facility]) Care Management  03/13/2017  LACRYSTAL BARBE August 16, 1950 [id number]   Baltazar Apo is referred to pharmacy for medication assistance. Call to speak with the patient. Ms. Alderman answers and reports that she is currently out of the house. Patient states that she sees my phone number on her caller ID and states that she will call me back when she gets home. If I have not heard back from patient by 03/16/17, will follow up again at that time.  Harlow Asa, PharmD, [facility] Management [phone]

## 2017-03-13 NOTE — Patient Outreach (Signed)
[place] [facility]) Care Management  03/13/2017  [name] Jan 12, 1951 [id number]   Referral received from telephonic care manager for assistance with management of diabetes as recent A1C was greater than 14. Was recently admitted to and discharged from hospital outside the system, transition of care assessment complete by D. Dia Sitter, RN.   Call placed to member to introduce self as assigned community nurse and to schedule home visit.  She state she is not able to talk at this time as she is out paying bills.  She request this care manager call back later this afternoon.        Update @ 1530:  Call received back from member, identity verified.  This care manager introduced self and purpose of call.  She report that she is trying to learn how to manage her newly diagnosed condition but is looking forward to the help of THN.  She report she was contacted today by C. Summe in regards to assistance with her medications.  She denies any urgent concerns, agrees to home visit next week.  Advised to contact this care manager with questions.  THN CM Care Plan Problem One     Most Recent Value  Care Plan Problem One  Risk for readmission related to uncontrolled diabetes as evidenced by recent hospitalization.  Role Documenting the Problem One  Care Management [place] for Problem One  Active  THN Long Term Goal   Member will not be readmitted to hospital within the next 31 days  THN Long Term Goal Start Date  03/13/17  Interventions for Problem One Long Term Goal  Discussed with member the importance of following discharge instructions, including follow up appointments, medications, and diet, to decrease the risk of readmission  THN CM Short Term Goal #1   Member will check blood sugars and record at least daily over the next 4 weeks  THN CM Short Term Goal #1 Start Date  03/13/17  Interventions for Short Term Goal #1  Educated on importance of checking blood sugar in  effort to manage diabetes on a daily basis.  Confirmed she has glucose meter for daily readings.  THN CM Short Term Goal #2   Member will report compliance with medications over the next 4 weeks  THN CM Short Term Goal #2 Start Date  03/13/17  Interventions for Short Term Goal #2  Verified with member that she has been in contact with [facility] pharmacist.  Educated on importance of taking medications as prescribed in effort to effectively manage diabetes      Valente David, Therapist, sports, MSN [place] Manager [phone]

## 2017-03-13 NOTE — Addendum Note (Signed)
Addended by: Harlow Asa A on: 03/13/2017 04:24 PM   Modules accepted: Orders

## 2017-03-13 NOTE — Patient Outreach (Signed)
[place] [facility]) Care Management  03/13/2017  [name] 1950-01-19 [id number]   Call and follow up with [name]. HIPAA identifiers verified and verbal consent received. Patient verifies that she just spoke with [name] in Dr. Thea Gist office and that she understands that per her PCP, she is to continue only the Lantus, injecting 20 units nightly at bedtime as previously directed; but is not to start Novolin N at this time. Confirms that she will check her blood sugar twice daily as directed.   Patient denies any further medication questions/concerns at this time. Confirm that patient has my phone number for future questions.  Will close pharmacy episode at this time.  Harlow Asa, PharmD, [place] Management [phone]

## 2017-03-17 ENCOUNTER — Other Ambulatory Visit: Payer: Self-pay | Admitting: *Deleted

## 2017-03-17 ENCOUNTER — Encounter: Payer: Self-pay | Admitting: *Deleted

## 2017-03-17 NOTE — Patient Outreach (Signed)
[place] [facility]) Care Management   03/17/2017  NANAKO STOPHER 08-27-1950 [id number]  TAWNIE EHRESMAN is an 67 y.o. female  Subjective:   Member alert and oriented x3, denies pain or discomfort at this time.  Denies any hyper/hypo glycemic episodes.  State she has been checking blood sugar daily since discharge.  Report she has follow up with primary MD on 3/15.  Objective:   Review of Systems  Constitutional: Negative.   HENT: Negative.   Eyes: Negative.   Respiratory: Negative.   Cardiovascular: Negative.   Gastrointestinal: Negative.   Genitourinary: Negative.   Musculoskeletal: Negative.   Skin: Negative.   Neurological: Negative.   Endo/Heme/Allergies: Negative.   Psychiatric/Behavioral: Negative.     Physical Exam  Constitutional: She is oriented to person, place, and time. She appears well-developed and well-nourished.  Neck: Normal range of motion.  Cardiovascular: Normal rate, regular rhythm and normal heart sounds.  Respiratory: Effort normal and breath sounds normal.  GI: Soft. Bowel sounds are normal.  Musculoskeletal: Normal range of motion.  Neurological: She is alert and oriented to person, place, and time.  Skin: Skin is warm and dry.   BP 130/88 (BP Location: Left Arm, Patient Position: Sitting, Cuff Size: Normal)   Pulse 83   Resp 18   Ht 1.575 m (5' 2" )   Wt 210 lb (95.3 kg)   SpO2 98%   BMI 38.41 kg/m   Encounter Medications:   Outpatient Encounter Medications as of 03/17/2017  Medication Sig  . amLODipine (NORVASC) 10 MG tablet Take 1 tablet (10 mg total) by mouth daily.  . clopidogrel (PLAVIX) 75 MG tablet Take 1 tablet (75 mg total) by mouth daily. For Peripheral Arterial disease  . doxepin (SINEQUAN) 150 MG capsule Take 1 capsule (150 mg total) by mouth at bedtime.  . furosemide (LASIX) 20 MG tablet Take 1 tablet (20 mg total) by mouth daily.  . hydrochlorothiazide (HYDRODIURIL) 12.5 MG tablet Take 2 tablets (25 mg total) by  mouth daily.  . insulin glargine (LANTUS) 100 UNIT/ML injection Inject 20 Units into the skin at bedtime.  [place] Kitchen levothyroxine (SYNTHROID, LEVOTHROID) 112 MCG tablet TAKE 1 TABLET BY MOUTH ONCE DAILY BEFORE BREAKFAST  . pantoprazole (PROTONIX) 40 MG tablet take 1 tablet by mouth twice a day for ACID REFLUX  . gabapentin (NEURONTIN) 400 MG capsule Take 1 capsule (400 mg total) by mouth 3 (three) times daily.  . nicotine polacrilex (NICORETTE) 2 MG gum Take 1 each (2 mg total) by mouth as needed for smoking cessation. (Patient not taking: Reported on 03/11/2017)   No facility-administered encounter medications on file as of 03/17/2017.     Functional Status:   In your present state of health, do you have any difficulty performing the following activities: 03/12/2017 01/21/2017  Hearing? N N  Vision? N Y  Difficulty concentrating or making decisions? N N  Walking or climbing stairs? Y Y  Dressing or bathing? N N  Doing errands, shopping? N N  Preparing Food and eating ? N -  Using the Toilet? N -  In the past six months, have you accidently leaked urine? N -  Do you have problems with loss of bowel control? N -  Managing your Medications? N -  Managing your Finances? Y -  Housekeeping or managing your Housekeeping? Y -  Some recent data might be hidden    Fall/Depression Screening:    Fall Risk  03/12/2017 03/10/2017 01/21/2017  Falls in the past year? Yes  Yes Exclusion - non ambulatory  Comment - - -  Number falls in past yr: 2 or more 2 or more 2 or more  Comment - - -  Injury with Fall? Yes Yes No  Comment - - a little bruised  Risk Factor Category  High Fall Risk High Fall Risk High Fall Risk  Risk for fall due to : History of fall(s);Impaired balance/gait;Impaired mobility History of fall(s) History of fall(s)  Risk for fall due to: Comment - - -  Follow up Education provided;Falls prevention discussed - Falls prevention discussed   PHQ 2/9 Scores 03/12/2017 03/10/2017 01/21/2017 12/12/2016  09/11/2016 08/23/2015 07/25/2015  PHQ - 2 Score 1 1 2 2 3 1  0  PHQ- 9 Score - - 13 12 14  - -    Assessment:    Met with member at scheduled time.  [facility] care management services again explained, consent obtained.  Report she is learning to change her habits (mainly diet) as she is typically a Advertising copywriter."  State she would at times go all day without eating and "live off of Pepsi, chocolate, sugar wafers, chips."  She confirms that she has now started to buy diet and sugar free drinks/snacks, state she is attempting to eat at least 3 times a day.    This care manager inquired about attending diabetes class, she state she is aware of how to manage condition as she has had several family member's with diabetes.  Does not feel she need class.  She has seen dietician, next visit scheduled for tomorrow, however report she does not have transportation and would like to see her when she is in office next week.  She will call office with this request.  Ability to maintain healthy eating is a concern for this care manager as she lives in a room, no stove available and with a small mini-fridge, discussed with member.  She state her family has also expressed concern, but she is not open to other living options at this time as she is comfortable and is able to afford rent.  Report her landlord will provide a larger refrigerator and she has a hotplate and crockpot, ensures she is able to follow diabetic diet.    Denies any other concerns at this time.  Advised to contact this care manager with any questions, contact information provided.  Plan:   Will follow up telephonically next week with weekly transition of care call.  THN CM Care Plan Problem One     Most Recent Value  Care Plan Problem One  Risk for readmission related to uncontrolled diabetes as evidenced by recent hospitalization.  Role Documenting the Problem One  Care Management [place] for Problem One  Active  THN Long Term Goal   Member  will not be readmitted to hospital within the next 31 days  THN Long Term Goal Start Date  03/13/17  Interventions for Problem One Long Term Goal  Educated on complications of untreated diabetes.  Provided with Living Well with Diabetes book.  THN CM Short Term Goal #1   Member will check blood sugars and record at least daily over the next 4 weeks  THN CM Short Term Goal #1 Start Date  03/13/17  Interventions for Short Term Goal #1  Education provided on correct procedure for checking blood sugar.  Advised on signs/symptoms of hyper/hypoglycemia and interventions to treat.  THN CM Short Term Goal #2   Member will report compliance with medications over the next  4 weeks  THN CM Short Term Goal #2 Start Date  03/13/17  Interventions for Short Term Goal #2  Medications reviewed in the home.  Provided with pill planner to increase medication adherence.     Valente David, [place], MSN [place] [phone]

## 2017-03-18 ENCOUNTER — Encounter: Payer: Self-pay | Admitting: Dietician

## 2017-03-23 ENCOUNTER — Other Ambulatory Visit: Payer: Self-pay | Admitting: *Deleted

## 2017-03-23 NOTE — Patient Outreach (Signed)
[place] [facility]) Care Management  03/23/2017  [name] July 29, 1950 [id number]   Weekly transition of care call placed to member.  She report doing well, state blood sugars have been below 200 (range 115-175).  Denies any hyper/hypoglycemic episodes.  Has appointment Friday with primary MD for diabetes management.  Reminded to call today to secure transportation as 3 day notice is needed with [employer].  She denies any urgent concerns.  She does inquire about help with increasing amount of food stamps she receive.  Advised that LCSW is active, advised to discuss with her during planned call today.  Also advised that per collaboration with LCSW, Medicaid application has been initiated on member's behalf.  She expresses gratitude for services.  Will follow up next week, advised to contact with questions.  THN CM Care Plan Problem One     Most Recent Value  Care Plan Problem One  Risk for readmission related to uncontrolled diabetes as evidenced by recent hospitalization.  Role Documenting the Problem One  Care Management [place] for Problem One  Active  THN Long Term Goal   Member will not be readmitted to hospital within the next 31 days  THN Long Term Goal Start Date  03/13/17  Interventions for Problem One Long Term Goal  Member advised again on importance of eating well balanced diet in effort to control daily blood sugars and decrease overall A1C  THN CM Short Term Goal #1   Member will check blood sugars and record at least daily over the next 4 weeks  THN CM Short Term Goal #1 Start Date  03/13/17  Interventions for Short Term Goal #1  Member re-educated on correct way to stick finger for adequate blood flow.  Demonstration complete during home visit.  THN CM Short Term Goal #2   Member will report compliance with medications over the next 4 weeks  THN CM Short Term Goal #2 Start Date  03/13/17  [facility] CM Short Term Goal #2 Met Date  03/23/17   Interventions for Short Term Goal #2  Medications reviewed in the home.  Provided with pill planner to increase medication adherence.      Valente David, [place], MSN [place] [phone]

## 2017-03-23 NOTE — Patient Outreach (Signed)
[place] [facility]) Care Management  03/23/2017  DEVINA BEZOLD [birth year] [id number]   CSW made an attempt to try and contact patient today to follow-up regarding social work services and resources; however, patient was not available at the time of CSW's call.  A HIPAA compliant message was left for patient on voicemail.  CSW is currently awaiting a return call.  CSW will make a second outreach attempt on Tuesday, March 24, 2017, if a return call is not received from patient in the meantime. Nat Christen, BSW, MSW, LCSW  Licensed Education officer, environmental Health System  Mailing [place] [address], [place], [place] [address] Physical Address-[address], [place], [place] [address] Toll Free Main # [phone] Fax # [phone] Cell # [phone]  Office # [phone] [name]

## 2017-03-24 ENCOUNTER — Other Ambulatory Visit: Payer: Self-pay | Admitting: *Deleted

## 2017-03-24 NOTE — Patient Outreach (Signed)
[place] [facility]) Care Management  03/24/2017  [name] 1950/05/25 [id number]   CSW was able to make contact with patient today to follow-up regarding social work services and resources, as well as to ensure that patient received the packet of resource information mailed to her home.  Patient confirmed that she has and that she has already begun utilizing [employer] [place] in [place].  Patient also plans to utilize [employer] through [employer] [place] until she is approved for [employer]), as application is still pending.  CSW agreed to schedule an initial home visit with patient to assist with completion of an Adult Medicaid application, which CSW will submit directly to the [place] for processing.  The initial home visit has been scheduled for Monday, March 30, 2017 at 10:00 AM. [facility] CM Care Plan Problem One     Most Recent Value  Care Plan Problem One  Lack of transportation to and from physician appointments.  Role Documenting the Problem One  Clinical Social Worker  Care Plan for Problem One  Active  [facility] Long Term Goal   Patient will have secure transportation arrangements in place, within the next 45 days, through [employer] and [employer].  THN Long Term Goal Start Date  03/12/17  Interventions for Problem One Long Term Goal  CSW will assist patient with referral to [employer] and [employer].   THN CM Short Term Goal #1   Patient will contact a representative with [employer], within the next two weeks, if she has not received a call regarding application submission.  THN CM Short Term Goal #1 Start Date  03/12/17  Interventions for Short Term Goal #1  Patient has been given the contact number for [employer] and the name of a representative to contact regarding her [employer] application.  THN CM  Short Term Goal #2   Patient will contact [employer] through [employer] [place], within the next 30 days, when transportation is needed to get to and from her physician appointments.  THN CM Short Term Goal #2 Start Date  03/12/17  [facility] CM Short Term Goal #2 Met Date  03/24/17  Interventions for Short Term Goal #2  Patient has been given a contact number and name for a representative with [employer].    THN CM Care Plan Problem Two     Most Recent Value  Care Plan Problem Two  Lack of food resources.  Role Documenting the Problem Two  Clinical Social Worker  Care Plan for Problem Two  Active  THN CM Short Term Goal #1   Patient will utilize [employer] and [employer] to obtain food, within the next 30 days.  THN CM Short Term Goal #1 Start Date  03/12/17  [facility] CM Short Term Goal #1 Met Date   03/24/17  Interventions for Short Term Goal #2   CSW will mail patient a copy of [employer] [place] [employer].    THN CM Care Plan Problem Three     Most Recent Value  Care Plan Problem Three  Lack of financial resources to pay for medical expenses.  Role Documenting the Problem Three  Clinical Social Worker  Care Plan for Problem Three  Active  THN CM Short Term Goal #1  Patient will receive assistance with completion of an Adult Medicaid application, within the next week.  THN CM Short Term Goal #1 Start Date  03/24/17  Interventions for Short Term Goal #1  CSW has scheduled an initial home visit with patient to assist with completion of an Adult Medicaid application, which CSW will submit directly to the [place] for processing.    Nat Christen, BSW, MSW, LCSW  Licensed Education officer, environmental Health System  Mailing [place] [address] [place], [place] [address] Physical Address-[address], [place], [place] [address] Toll Free Main #  [phone] Fax # [phone] Cell # [phone]  Office # [phone] [name]

## 2017-03-25 ENCOUNTER — Telehealth: Payer: Self-pay

## 2017-03-25 DIAGNOSIS — E1169 Type 2 diabetes mellitus with other specified complication: Secondary | ICD-10-CM

## 2017-03-25 DIAGNOSIS — E669 Obesity, unspecified: Principal | ICD-10-CM

## 2017-03-25 MED ORDER — "INSULIN SYRINGE-NEEDLE U-100 31G X 15/64"" 0.3 ML MISC": 5 refills | Status: DC

## 2017-03-25 NOTE — Telephone Encounter (Signed)
Pt called back stating the pharmacy is requesting a Rx for a needle so that it will be covered by the insurance. Please call pt back.

## 2017-03-25 NOTE — Telephone Encounter (Signed)
Called pharmacy to confirm she needs syringes and not pen needles. They do not have lantus on file. They have vials of Novolin. She needs syringes. Called patient to let her know- she says the [facility].

## 2017-03-25 NOTE — Telephone Encounter (Signed)
Pt states she is out of the insulin syringes, but do not have any funds to purchase. Requesting samples, please call pt back.

## 2017-03-27 ENCOUNTER — Ambulatory Visit (INDEPENDENT_AMBULATORY_CARE_PROVIDER_SITE_OTHER): Payer: Medicare HMO | Admitting: Dietician

## 2017-03-27 ENCOUNTER — Ambulatory Visit (INDEPENDENT_AMBULATORY_CARE_PROVIDER_SITE_OTHER): Payer: Medicare HMO | Admitting: Internal Medicine

## 2017-03-27 ENCOUNTER — Telehealth: Payer: Self-pay

## 2017-03-27 VITALS — BP 156/92 | HR 86 | Temp 98.4°F | Wt 211.5 lb

## 2017-03-27 DIAGNOSIS — Z794 Long term (current) use of insulin: Secondary | ICD-10-CM

## 2017-03-27 DIAGNOSIS — Z79899 Other long term (current) drug therapy: Secondary | ICD-10-CM

## 2017-03-27 DIAGNOSIS — E669 Obesity, unspecified: Secondary | ICD-10-CM

## 2017-03-27 DIAGNOSIS — E1151 Type 2 diabetes mellitus with diabetic peripheral angiopathy without gangrene: Secondary | ICD-10-CM

## 2017-03-27 DIAGNOSIS — M5431 Sciatica, right side: Secondary | ICD-10-CM

## 2017-03-27 DIAGNOSIS — E1169 Type 2 diabetes mellitus with other specified complication: Secondary | ICD-10-CM | POA: Diagnosis not present

## 2017-03-27 DIAGNOSIS — Z713 Dietary counseling and surveillance: Secondary | ICD-10-CM | POA: Diagnosis not present

## 2017-03-27 DIAGNOSIS — Z6838 Body mass index (BMI) 38.0-38.9, adult: Secondary | ICD-10-CM | POA: Diagnosis not present

## 2017-03-27 DIAGNOSIS — M5432 Sciatica, left side: Secondary | ICD-10-CM

## 2017-03-27 DIAGNOSIS — R6889 Other general symptoms and signs: Secondary | ICD-10-CM | POA: Diagnosis not present

## 2017-03-27 DIAGNOSIS — Z833 Family history of diabetes mellitus: Secondary | ICD-10-CM | POA: Diagnosis not present

## 2017-03-27 MED ORDER — GABAPENTIN 400 MG PO CAPS: ORAL_CAPSULE | ORAL | 1 refills | Status: DC

## 2017-03-27 MED ORDER — METFORMIN HCL ER 500 MG PO TB24: ORAL_TABLET | ORAL | 2 refills | Status: DC

## 2017-03-27 NOTE — Progress Notes (Signed)
Medical Nutrition Therapy:  Appt start time: 1340 end time:  9562. Visit # 1  Assessment:  Primary concerns today:meal planning for blood sugar and pressure control.  [name] recently moved here. She resides in one room, has a small refrigerator, microwave, crockpot and hot plate. She gets 15$ food stamps and has a very limited income. She reports recently intentional weight loss.  Patient is not on a statin, and blood glucsoe has worsened since lipids last checked.  Preferred Learning Style: No preference indicated  Learning Readiness: Change in progress  ANTHROPOMETRICS: Estimated body mass index is 38.68 kg/m as calculated from the following:   Height as of 03/17/17: 5\' 2"  (1.575 m).   Weight as of an earlier encounter on 03/27/17: 211 lb 8 oz (95.9 kg). WEIGHT HISTORY: Highest: 230# Lowest-208.9# SLEEP:need to assess at future visit Labs: Lab Results  Component Value Date   HGBA1C >14.0 03/10/2017   Lipid Panel     Component Value Date/Time   CHOL 341 (H) 12/20/2012 1019   TRIG 513 (H) 12/20/2012 1019   HDL 34 (L) 12/20/2012 1019   CHOLHDL 10.0 12/20/2012 1019   VLDL NOT CALC 12/20/2012 1019   [place]  12/20/2012 1019     Comment:       Not calculated due to Triglyceride >400. Suggest ordering Direct LDL (Unit Code: 2568363730).   Total Cholesterol/HDL Ratio:CHD Risk                        Coronary Heart Disease Risk Table                                        Men       Women          1/2 Average Risk              3.4        3.3              Average Risk              5.0        4.4           2X Average Risk              9.6        7.1           3X Average Risk             23.4       11.0 Use the calculated Patient Ratio above and the CHD Risk table  to determine the patient's CHD Risk. ATP III Classification (LDL):       < 100        mg/dL         Optimal      100 - 129     mg/dL         Near or Above Optimal      130 - 159     mg/dL         Borderline High      160 -  189     mg/dL         High       > 190        mg/dL         Very High  LDLDIRECT 140 (H) 12/20/2012 1019    MEDICATIONS: lantus  BLOOD SUGAR:well controlled off meal time insuli with average for the past month 155mg /dl testing two times a day DIETARY INTAKE: Usual eating pattern includes 3 meals and 1-2 snacks per day.Everyday foods include fruit, vegetable, protein.  Avoided foods include red meat, fried foods  24-hr recall:  B ( AM): toast or waffle or grits with butter, coffee with creamer and non nutritive sweetener  Lunch: tuna sandwich Dinner: meat, starch and vegetable Snk ( PM): fruit or cracker with peanut butter Beverages: coffee, water  Usual physical activity: need to assess at future visit  Progress Towards Goal(s):  In progress.   Nutritional Diagnosis:  NB-1.1 Food and nutrition-related knowledge deficit As related to lack of sufficient prior training in diabetes meal planning.  As evidenced by her report and questions.    Intervention:  Nutrition education about diabetes meal planing and interpreting her blood glucose numbers and meter download printout.  Coordination of care: suggest repeat lipids once diabetes is well controlled.  Teaching Method Utilized: Visual. Auditory.Hands on Handouts given during visit include: Barriers to learning/adherence to lifestyle change: limited material resources Demonstrated degree of understanding via:  Teach Back   Monitoring/Evaluation:  Dietary intake, exercise, meter, and body weight in 4 week(s)  [name], RD 03/30/2017 12:45 PM. .

## 2017-03-27 NOTE — Progress Notes (Signed)
   CC: Diabetes follow-up   HPI:  [name] is a 67 y.o. female who presented to the clinic for continued evaluation and management of her chronic medical illnesses. For detailed assessment and management plan please refer to problem-based charting below.  Past Medical History:  Diagnosis Date  . Allergy   . Anal fissure   . Anxiety   . Cataract   . Chronic headaches   . Depression   . Diverticulosis   . GERD (gastroesophageal reflux disease)   . Glaucoma   . HLD (hyperlipidemia)   . Hypertension   . Hypothyroidism   . PAD (peripheral artery disease) ([place])   . PUD (peptic ulcer disease)   . TIA (transient ischemic attack)    Review of Systems:   Denies fevers, chills Denies CP, SOB  Physical Exam: Vitals:   03/27/17 1430  BP: (!) 156/92  Pulse: 86  Temp: 98.4 F (36.9 C)  TempSrc: Oral  SpO2: 100%  Weight: 211 lb 8 oz (95.9 kg)   General: Obese female in no acute distress Pulm: Good air movement with no wheezing or crackles  CV: RRR, no murmurs, no rubs   Neuro: Alert and oriented x 3. Gross strength 3/5 on the right and 3/5 on the left. Sensation to light touch intact. Straight leg raise test positive on the right. No tenderness to palpation of the lumbar spinous process.   Assessment & Plan:   See Encounters Tab for problem based charting.  Patient discussed with Dr. Dareen Piano

## 2017-03-27 NOTE — Patient Instructions (Addendum)
Thank you for allowing [place] to provide your care.   We have started a new medication called Metformin. You will start by taking 1 tablet in the morning for 1 week then increase to 1 tablet twice a day.  I have put in for you to get imaging of your back. They will call you with an appointment. In the meantime we will increase your Gabapentin to 400 mg with breakfast and lunch, then 800 mg at bedtime.

## 2017-03-28 ENCOUNTER — Encounter: Payer: Self-pay | Admitting: Internal Medicine

## 2017-03-28 DIAGNOSIS — M5432 Sciatica, left side: Secondary | ICD-10-CM

## 2017-03-28 DIAGNOSIS — M5431 Sciatica, right side: Secondary | ICD-10-CM | POA: Insufficient documentation

## 2017-03-28 NOTE — Assessment & Plan Note (Signed)
Patient presented for continued evaluation and management of her diabetes mellitus. She is newly diagnosed after being admitted from 2/21-2/25/19 for hyperglycemia. On discharged she was on Lantus 20 units QHS and Humulin 5 units TID WC. She was seen in the clinic on 2/27, at which point her Humalin was stopped and her A1c was rechecked. Her A1c was found to be >14.  Today the patient is doing well. She has been taking her Lantus as prescribed and checking her blood sugars 2-3 times daily. She is trying to avoid high carb meals and snacks. She has been unable to exercise due to bilateral sciatic pain the recently started. She does have a strong family history of DM and knows several of the complications of diabetes. She would like to be referred to an opthamologist for an eye exam.   CBG Trend: Average blood glucose is 155 with a max of 321 and min of 89. She has not had any blood glucose above 210 since 03/01.   At this point the patient seems to be doing well on the Lantus 20 units QHS. 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 With that we should still try to obtain records for her hospitalization to guide whether she needs further work-up (autoantibiodies). Today we will start Metformin 500 mg QAM for 1 week then increase to Metformin 500 mg BID. She will follow-up in 1 month to further increase her metformin and for re-evaluation.   Plan: - Continue to work to obtain prior hospitalization records - Continue Lantus 20 units QHS - START Metformin 500 mg QD for 1 week then increase to Metformin 500 mg BID - Follow-up in 1 month for further titration of her metformin

## 2017-03-28 NOTE — Assessment & Plan Note (Addendum)
Patient presents for evaluation of bilateral LE pain. She states that she typically has LE claudication pain from her PAD but this is a different type of pain. It radiates from her lower back down both her legs to her feet. The pain is burning/stabbing in nature. It started 2 weeks ago. She does not remember any trauma to the area. She does not remember straining her back 2 weeks ago. She does feel that her LE strength is decreased compared to prior. She denies pain that wakes her up at night, changes in bowel or bladder habits, fevers, or history of cancer.   Neuro exam is significant for gross strength 3/5 on the right and 3/5 on the left. Sensation to light touch intact. Straight leg raise test positive on the right. No tenderness to palpation of the lumbar spinous process.  Given the change in the patient's LE pain, bilateral nature, and weakness on PE further imaging by MRI is warranted. While awaiting MRI results we will increase the patient's gabapentin to 400 mg in the AM and with lunch, then 800 mg at night. I have sent out a new prescription.   Plan: - Urgent MRI of the lumbar spine  - Gabapentin 400 mg in the morning and afternoon, 800 mg at bedtime  - Follow-up in 1 month

## 2017-03-30 ENCOUNTER — Encounter: Payer: Self-pay | Admitting: Dietician

## 2017-03-30 ENCOUNTER — Other Ambulatory Visit: Payer: Self-pay | Admitting: *Deleted

## 2017-03-30 ENCOUNTER — Ambulatory Visit: Payer: Self-pay | Admitting: *Deleted

## 2017-03-30 DIAGNOSIS — E118 Type 2 diabetes mellitus with unspecified complications: Secondary | ICD-10-CM

## 2017-03-30 MED ORDER — INSULIN GLARGINE 100 UNIT/ML ~~LOC~~ SOLN: 20.0000 [IU] | Freq: Every day | SUBCUTANEOUS | 1 refills | Status: DC

## 2017-03-30 NOTE — Progress Notes (Signed)
Internal Medicine Clinic Attending  Case discussed with Dr. Helberg at the time of the visit.  We reviewed the resident's history and exam and pertinent patient test results.  I agree with the assessment, diagnosis, and plan of care documented in the resident's note.    

## 2017-03-31 ENCOUNTER — Other Ambulatory Visit: Payer: Self-pay | Admitting: *Deleted

## 2017-03-31 NOTE — Patient Outreach (Signed)
[place] [facility]) Care Management  03/31/2017  [name] [birth year] [id number]  CSW was able to meet with patient today to perform an initial home visit.  CSW was a little concerned about patient's current living arrangements, as patient pays $550.00 per month for a very small living space in a building that looks to be condemnable.  Patient reported that she is comfortable with her arrangements and that the rent is within her budget.  Patient was not interested in talking about alternative placement arrangements, despite CSW's willingness to assist with this process.  Patient went on to say that she has been homeless, shortly after her husband died, and that she is just thankful to have a roof over her head.  Patient has only been residing at the apartment since August of 2018. CSW was able to assist patient with completion of her Adult Medicaid application and agreed to submit it directly to the [place] for processing.  Patient is aware that it can take 45-60 days to process the application so she will not hear back from anyone, unless they require additional information, for the next couple of months.  Patient has been given the contact number for Adult Medicaid and has been encouraged to contact them periodically to check the status of her application.  Patient is already receiving Food Stamps in the amount of $15.00 per month.  Patient did not wish to apply for Medicaid Transportation, indicating that she will utilize [employer] through [employer] and/or [employer] (Engineering geologist) through [employer]), now that she has been approved. Patient admitted that she is in need of a refrigerator for her apartment.  CSW spoke with patient about a referral to the [facility], which patient was agreeable too.  CSW agreed to write a letter on patient's behalf to prove medical  necessity, if need be.  CSW explained to patient that [facility] does not have a contract with the [employer] and that patient would have to go through either [employer] or [place].  Patient voiced understanding and was agreeable to this plan.  CSW provided patient with contact information for both. CSW will perform a case closure on patient, as all goals of treatment have been met from social work standpoint and no additional social work needs have been identified at this time. CSW will notify patient's RNCM with [place] [facility], Valente David of CSW's plans to close patient's case.  CSW will fax an update to patient's Primary Care Physician, [name] to ensure that they are aware of CSW's involvement with patient's plan of care.  CSW will submit a case closure request to Alycia Rossetti, Care Management Assistant with [place] [facility], in the form of an [employer].  CSW will ensure that Mrs. [name] is aware of Mrs. [name], RNCM with [place] [facility], continued involvement with patient's care. THN CM Care Plan Problem One     Most Recent Value  Care Plan Problem One  Lack of transportation to and from physician appointments.  Role Documenting the Problem One  Clinical Social Worker  Care Plan for Problem One  Active  [facility] Long Term Goal   Patient will have secure transportation arrangements in place, within the next 45 days, through [employer] and [employer].  THN Long Term Goal Start Date  03/12/17  THN Long Term Goal Met Date  03/31/17  Interventions for Problem One Long Term Goal  Patient will utilize [employer] and/or Armed forces technical officer for all upcoming physician appointments.  THN CM Short Term Goal #1   Patient will contact a representative with [employer], within the next two weeks, if she has not received a call regarding application submission.   THN CM Short Term Goal #1 Start Date  03/12/17  [facility] CM Short Term Goal #1 Met Date  03/31/17  Interventions for Short Term Goal #1  An initial phone assessment has been completed with patient by a representative from [place].  THN CM Short Term Goal #2   Patient will contact [employer] through [employer] [place], within the next 30 days, when transportation is needed to get to and from her physician appointments.  THN CM Short Term Goal #2 Start Date  03/12/17  [facility] CM Short Term Goal #2 Met Date  03/24/17  Interventions for Short Term Goal #2  Patient will contact [employer] and/or [employer] to get to and from physician appointments, moving forward.    THN CM Care Plan Problem Two     Most Recent Value  Care Plan Problem Two  Lack of food resources.  Role Documenting the Problem Two  Clinical Social Worker  Care Plan for Problem Two  Active  THN CM Short Term Goal #1   Patient will utilize [employer] [place] [employer] to obtain food, within the next 30 days.  THN CM Short Term Goal #1 Start Date  03/12/17  [facility] CM Short Term Goal #1 Met Date   03/24/17  Interventions for Short Term Goal #2   Patient is utilizing [employer] [place] [employer] to obtain food and meals in [place].    THN CM Care Plan Problem Three     Most Recent Value  Care Plan Problem Three  Lack of financial resources to pay for medical expenses.  Role Documenting the Problem Three  Clinical Social Worker  Care Plan for Problem Three  Active  [facility] CM Short Term Goal #1   Patient will receive assistance with completion of an Adult Medicaid application, within the next week.  THN CM Short Term Goal #1 Start Date  03/24/17  [facility] CM Short Term Goal #1 Met Date  03/31/17  Interventions for Short Term Goal #1  Adult Medicaid application has been completed with the assistance of CSW and submitted to the [place] for processing.    Nat Christen, BSW, MSW, LCSW  Licensed Education officer, environmental Health System  Mailing [place] [address] [place], [place] [address] Physical Address-[address], [place], [place] [address] Toll Free Main # [phone] Fax # [phone] Cell # [phone]  Office # [phone] [name]

## 2017-03-31 NOTE — Patient Outreach (Signed)
[place] [facility]) Care Management  03/31/2017  ETHAL GOTAY [birth year] [id number]   Weekly transition of care call placed to member.  She report she continue to struggle to manage diabetes due to limited resources.  She has visit today with LCSW to review available resources.  Report blood sugar 157 today, 160s last night.  She was started on Metformin and insulin increased.  She also report increased leg pain - increased gabapentin as well.  She state she is experiencing pain at insulin injection site, advised about rotating site.  Denies any urgent concerns at this time.  Will follow up next week.  THN CM Care Plan Problem One     Most Recent Value  Care Plan Problem One  Risk for readmission related to uncontrolled diabetes as evidenced by recent hospitalization.  Role Documenting the Problem One  Care Management [place] for Problem One  Active  THN Long Term Goal   Member will not be readmitted to hospital within the next 31 days  THN Long Term Goal Start Date  03/13/17  Interventions for Problem One Long Term Goal  Educated on complications of uncontrolled diabetes.  THN CM Short Term Goal #1   Member will check blood sugars and record at least daily over the next 4 weeks  THN CM Short Term Goal #1 Start Date  03/13/17  [facility] CM Short Term Goal #1 Met Date  03/31/17     Valente David, RN, MSN [place] Manager [phone]

## 2017-04-05 ENCOUNTER — Other Ambulatory Visit: Payer: Self-pay | Admitting: Internal Medicine

## 2017-04-08 ENCOUNTER — Other Ambulatory Visit: Payer: Self-pay | Admitting: *Deleted

## 2017-04-08 NOTE — Patient Outreach (Signed)
[place] [facility]) Care Management  04/08/2017  [name] 1951-01-07 [id number]   Weekly transition of care call placed to member.  She report she continues to struggle to manage diet due to lack of resources.  She has been in contact with LCSW and is awaiting a call back regarding assistance with larger refrigerator and other food resources.  She report her blood sugars ranging 80-110s fasting and up to 180s nonfasting.  Denies any urgent concerns at this time, agree to home visit next week.   THN CM Care Plan Problem One     Most Recent Value  Care Plan Problem One  Risk for readmission related to uncontrolled diabetes as evidenced by recent hospitalization.  Role Documenting the Problem One  Care Management [place] for Problem One  Active  THN Long Term Goal   Member will not be readmitted to hospital within the next 31 days  [facility] Long Term Goal Start Date  03/13/17  [facility] Long Term Goal Met Date  04/08/17      Valente David, RN, MSN [place] Manager [phone]

## 2017-04-10 ENCOUNTER — Ambulatory Visit: Payer: Self-pay

## 2017-04-14 ENCOUNTER — Encounter: Payer: Self-pay | Admitting: *Deleted

## 2017-04-14 ENCOUNTER — Other Ambulatory Visit: Payer: Self-pay | Admitting: *Deleted

## 2017-04-14 NOTE — Patient Outreach (Signed)
[place] [facility]) Care Management   04/14/2017  [name] [birth year] [id number]  [name] is an 67 y.o. female  Subjective:   Member alert and oriented x3, denies any complaints of pain or discomfort. Report she has been compliant with medications up until a few days ago when she ran out.  State she has not had money for refills.  She has appointment with vascular MD this week, eye doctor next week, and primary MD on 4/15.  Objective:   Review of Systems  Constitutional: Negative.   HENT: Negative.   Eyes: Negative.   Respiratory: Negative.   Cardiovascular: Negative.   Gastrointestinal: Negative.   Genitourinary: Negative.   Musculoskeletal: Negative.   Skin: Negative.   Neurological: Negative.   Endo/Heme/Allergies: Negative.   Psychiatric/Behavioral: Negative.     Physical Exam  Constitutional: She is oriented to person, place, and time. She appears well-developed and well-nourished.  Neck: Normal range of motion.  Cardiovascular: Normal rate, regular rhythm and normal heart sounds.  Respiratory: Effort normal and breath sounds normal.  GI: Soft. Bowel sounds are normal.  Musculoskeletal: Normal range of motion.  Neurological: She is alert and oriented to person, place, and time.  Skin: Skin is warm and dry.   BP 138/86 (BP Location: Left Arm, Patient Position: Sitting, Cuff Size: Normal)   Pulse 95   Resp 18   SpO2 98%   Encounter Medications:   Outpatient Encounter Medications as of 04/14/2017  Medication Sig  . amLODipine (NORVASC) 10 MG tablet Take 1 tablet (10 mg total) by mouth daily.  . clopidogrel (PLAVIX) 75 MG tablet Take 1 tablet (75 mg total) by mouth daily. For Peripheral Arterial disease  . doxepin (SINEQUAN) 100 MG capsule TAKE 1 CAPSULE BY MOUTH AT BEDTIME  . doxepin (SINEQUAN) 150 MG capsule Take 1 capsule (150 mg total) by mouth at bedtime.  . furosemide (LASIX) 20 MG tablet Take 1 tablet (20 mg total) by mouth daily.  [place]  gabapentin (NEURONTIN) 400 MG capsule 400 mg in the morning and at lunch, then 800 mg at bedtime  . hydrochlorothiazide (HYDRODIURIL) 12.5 MG tablet Take 2 tablets (25 mg total) by mouth daily.  . insulin glargine (LANTUS) 100 UNIT/ML injection Inject 0.2 mLs (20 Units total) into the skin at bedtime.  . Insulin Syringe-Needle U-100 31G X 15/64" 0.3 ML MISC Use to inject insulin daily  . levothyroxine (SYNTHROID, LEVOTHROID) 112 MCG tablet TAKE 1 TABLET BY MOUTH ONCE DAILY BEFORE BREAKFAST  . metFORMIN (GLUCOPHAGE-XR) 500 MG 24 hr tablet Take 1 tablet every morning (500 mg) for 1 week then increase to 1 tablet in the morning (500 mg) and 1 tablet at night (500 mg)  . pantoprazole (PROTONIX) 40 MG tablet take 1 tablet by mouth twice a day for ACID REFLUX  . nicotine polacrilex (NICORETTE) 2 MG gum Take 1 each (2 mg total) by mouth as needed for smoking cessation. (Patient not taking: Reported on 03/11/2017)   No facility-administered encounter medications on file as of 04/14/2017.     Functional Status:   In your present state of health, do you have any difficulty performing the following activities: 03/27/2017 03/12/2017  Hearing? N N  Vision? N N  Difficulty concentrating or making decisions? N N  Walking or climbing stairs? Y Y  Dressing or bathing? N N  Doing errands, shopping? N N  Preparing Food and eating ? - N  Using the Toilet? - N  In the past six months,  have you accidently leaked urine? - N  Do you have problems with loss of bowel control? - N  Managing your Medications? - N  Managing your Finances? - Y  Housekeeping or managing your Housekeeping? - Y  Some recent data might be hidden    Fall/Depression Screening:    Fall Risk  03/27/2017 03/12/2017 03/10/2017  Falls in the past year? Yes Yes Yes  Comment - - -  Number falls in past yr: 2 or more 2 or more 2 or more  Comment - - -  Injury with Fall? Yes Yes Yes  Comment - - -  Risk Factor Category  High Fall Risk High Fall  Risk High Fall Risk  Risk for fall due to : History of fall(s);Impaired balance/gait History of fall(s);Impaired balance/gait;Impaired mobility History of fall(s)  Risk for fall due to: Comment - - -  Follow up Falls prevention discussed Education provided;Falls prevention discussed -   PHQ 2/9 Scores 03/27/2017 03/12/2017 03/10/2017 01/21/2017 12/12/2016 09/11/2016 08/23/2015  PHQ - 2 Score 1 1 1 2 2 3 1   PHQ- 9 Score - - - 13 12 14  -    Assessment:    Met with member at scheduled time, no distress noted.  [facility] care management services again explained, including potential pharmacy involvement if needed for medication assistance.  She verbalizes understanding.  She report she has still not been able to get a full size refrigerator due to lack of resources in the community, but is still working with LCSW to find one.  She feel once this is done she will be able to purchase better food choices.  State she has been approved for increased food stamps.  Denies any urgent concerns, advised to contact this care manager with questions.  Plan:   Will follow up next month with routine home visit.  THN CM Care Plan Problem One     Most Recent Value  Care Plan Problem One  Knowledge deficit related to management of diabetes as evidenced by elevated A1C  Role Documenting the Problem One  Care Management [place] for Problem One  Active  THN Long Term Goal   Member's A1C will be decreased </= 11 within the next 2 months  THN Long Term Goal Start Date  04/14/17  Interventions for Problem One Long Term Goal  Member educated on the complications of uncontrolled diabetes  THN CM Short Term Goal #1   Member will report completed visit with eye MD within the next 4 weeks  THN CM Short Term Goal #1 Start Date  04/14/17  Interventions for Short Term Goal #1  Confirmed eye appointment on 4/9.  Educated on importance of eye care in relation to diabetes management  THN CM Short Term Goal #2   Member  will report taking medications as prescribed over the next 4 weeks  THN CM Short Term Goal #2 Start Date  04/14/17  Interventions for Short Term Goal #2  Member advised on importance of not letting medications run out.  Advised of [employer] mail order pharmacy in effort to have medications delivered and cost effectiveness.  Encouraged to call to have medications switched over.     Valente David, [place], MSN [place] [phone]

## 2017-04-15 ENCOUNTER — Other Ambulatory Visit: Payer: Self-pay | Admitting: Internal Medicine

## 2017-04-15 DIAGNOSIS — E039 Hypothyroidism, unspecified: Secondary | ICD-10-CM

## 2017-04-16 ENCOUNTER — Other Ambulatory Visit: Payer: Self-pay | Admitting: Internal Medicine

## 2017-04-16 ENCOUNTER — Telehealth: Payer: Self-pay

## 2017-04-16 MED ORDER — GLUCOSE BLOOD VI STRP: ORAL_STRIP | 2 refills | Status: DC

## 2017-04-16 NOTE — Telephone Encounter (Signed)
Not a problem. I sent them in! I filled accu chek activa. I hope those are the correct strips. If not I am happy to correct. Thanks Charleston Ropes and Lauren!  Curtis Cain

## 2017-04-16 NOTE — Progress Notes (Signed)
Refilled accu chek test strips

## 2017-04-16 NOTE — Telephone Encounter (Signed)
Requesting Accu-check test strips to be filled @ [facility] on [address]. Pt would like this by today.

## 2017-04-17 ENCOUNTER — Other Ambulatory Visit: Payer: Self-pay | Admitting: Internal Medicine

## 2017-04-17 ENCOUNTER — Ambulatory Visit: Payer: Medicare HMO | Admitting: Vascular Surgery

## 2017-04-17 ENCOUNTER — Encounter (HOSPITAL_COMMUNITY): Payer: Self-pay

## 2017-04-17 ENCOUNTER — Other Ambulatory Visit: Payer: Self-pay | Admitting: Pharmacist

## 2017-04-17 ENCOUNTER — Other Ambulatory Visit: Payer: Self-pay | Admitting: *Deleted

## 2017-04-17 MED ORDER — GLUCOSE BLOOD VI STRP: ORAL_STRIP | 2 refills | Status: DC

## 2017-04-17 NOTE — Telephone Encounter (Signed)
Script needed more info plus pt is using accuchek aviva, new script sent to dr Maricela Bo, will need 1 month to local [facility] [address] and 6 months to [facility] per elizabeth at [place]

## 2017-04-17 NOTE — Telephone Encounter (Signed)
Please call Benjamine Mola, regarding test strips

## 2017-04-17 NOTE — Telephone Encounter (Signed)
I have include instructions and dx code. Please verify.

## 2017-04-17 NOTE — Telephone Encounter (Signed)
Good Morning [name],  I have already filled the test strips. Please let me know if I need to fill again. Thanks!  [name]

## 2017-04-17 NOTE — Patient Outreach (Signed)
[place] [facility]) Care Management  04/17/2017  [name] [birth year] [id number]   Call and leave a message with [name] in patient's PCP office. Let [name] know that patient's prescription for her test strips is too soon through her insurance, but that patient reports that she is currently out of test strips. Note that patient reports that she has been testing more than twice daily as she has been trying to get the hang of testing her blood sugar. Note that the prescription that was sent over to [facility] by patient's PCP yesterday read "Use as instructed".  Will call PCP office to ask provider about testing frequency and, if provider is Okay with patient testing more than twice daily as patient reports that she has been doing, will request that provider send a new prescription for the Accu-Chek Aviva Plus, rather than Accu-chek Active, test strips with these specific instructions.   Harlow Asa, PharmD, [place] Management [phone]

## 2017-04-17 NOTE — Patient Outreach (Signed)
[place] [facility]) Care Management  04/17/2017  [name] 1950/05/22 [id number]   Call to follow up with patient's [place] to confirm that patient will be able to pick up her test strips today. Speak with Pharmacist who states that it is too soon for the prescription to go through the patient's insurance. Pharmacist also reports that the prescription was sent for Accu-chek Active, rather than Accu-Chek Aviva, test strips.   Call to follow up with [name]. Patient checks her meter and confirms that it is an Accu-Chek Aviva meter and strips that she has. Reports that the one and only time that she has had a prescription filled for the test strips was in late February when she was discharged from the hospital. Reports that the directions on this prescription were written for patient to test twice daily. Patient reports that she has been testing more than twice daily as she has been trying to get the hang of testing her blood sugar. Note that the prescription that was sent over to [facility] by patient's PCP yesterday read "Use as instructed".  Will call PCP office to ask provider about testing frequency and, if provider is Okay with patient testing more than twice daily as she reports that she has been doing, will request that she send a new prescription for the Accu-Chek Aviva Plus test strips with these specific instructions.  Harlow Asa, PharmD, [place] Management [phone]

## 2017-04-17 NOTE — Patient Outreach (Signed)
[place] [facility]) Care Management  04/17/2017  BRIANAH HOPSON [birth year] [id number]   Incoming call from [facility]. HIPAA identifiers verified and verbal consent received.  Ms. Bojarski reports that she is calling for help with obtaining her diabetic testing strips. Reports that she does not currently have a prescription for the test strips and needs one to be sent to her pharmacy. Perform Epic chart review and note that patient called her PCP office yesterday to make this request and, per note from [name], this prescription was sent to the patient's [place] yesterday.  Ms. Hangartner reports that she will call to follow up with her pharmacy now.   Harlow Asa, PharmD, [place] Management [phone]

## 2017-04-17 NOTE — Telephone Encounter (Signed)
Benjamine Mola is calling back from [facility], she wanted patient to be able to have test strips before the weekend

## 2017-04-17 NOTE — Telephone Encounter (Signed)
Benjamine Mola called back and ask that pt be called and reassured, she is new to the diab and is fearful, called her and informed her she [facility] and she is to continue taking her insulin, gave her info for this weekend to call im resident on call at [phone] if she needs anything, she is grateful

## 2017-04-17 NOTE — Patient Outreach (Signed)
[facility]) Care Management  04/17/2017  [name] 1950/11/21 [id number]   Call to follow up with [name]. HIPAA identifiers verified and verbal consent received.  Let patient know that I spoke with [name], RN at her PCP office, and that the prescription will be sent into her pharmacy today with updated instructions that will allow her prescription to go through her insurance today. Patient verbalizes understanding and states that she will call to follow up with her pharmacy this evening to verify when the prescription is ready. Patient also confirms that she is aware that if she has any issue, she can call the on-call [facility] Internal Medicine medical resident.  [name] denies any further medication questions at this time. Patient asks that I call to follow up with her next week.  PLAN  Will call to follow up with [name] on 04/20/17.  Harlow Asa, PharmD, [facility] [phone]

## 2017-04-17 NOTE — Patient Outreach (Signed)
[facility]) Care Management  04/17/2017  CHAMPAGNE PALETTA [birth year] [id number]   Incoming call from Freddy Finner, RN with [facility] Internal Medicine. [name] states that she will send patient's Accu-chek Aviva test strip prescription into her [facility] today with directions for patient to test three times daily.   Harlow Asa, PharmD, [facility] [phone]

## 2017-04-20 ENCOUNTER — Other Ambulatory Visit: Payer: Self-pay | Admitting: Pharmacist

## 2017-04-20 NOTE — Patient Outreach (Signed)
[facility]) Care Management  04/20/2017  [name] [birth year] [id number]  Outreach call to Baltazar Apo to follow up regarding her testing supplies. HIPAA identifiers verified and verbal consent received.  [name] confirms that she was able to get her blood glucose test strips this weekend. Patient expresses appreciation for my help with getting these. Reports that her morning fasting blood sugar was in the 90s yesterday morning and was 125 mg/dL this morning. Reports that she has been paying attention to her diet. Denies any recent low blood sugars. Counsel patient on signs of lows and managing lows. [name] reports that she has been taking her metformin as directed. Counsel patient about taking the medication consistently and with meals.   Note that [name] has full extra help with the cost of her medications. Remind patient about cost savings of using [employer] mail order for both her testing supplies and other prescriptions. Remind patient that I requested that her PCP office send a prescription for her testing supplies to [employer]. [name] reports that she will talk to [name] at their upcoming appointment about getting the rest of her medications through [facility] mail order.  [name] denies any further medication questions/concerns at this time. Will close pharmacy episode at this time.  Harlow Asa, PharmD, [facility] [phone]

## 2017-04-21 ENCOUNTER — Other Ambulatory Visit: Payer: Self-pay | Admitting: Internal Medicine

## 2017-04-21 DIAGNOSIS — E119 Type 2 diabetes mellitus without complications: Secondary | ICD-10-CM | POA: Diagnosis not present

## 2017-04-21 DIAGNOSIS — Z961 Presence of intraocular lens: Secondary | ICD-10-CM | POA: Diagnosis not present

## 2017-04-21 DIAGNOSIS — H2511 Age-related nuclear cataract, right eye: Secondary | ICD-10-CM | POA: Diagnosis not present

## 2017-04-21 DIAGNOSIS — H11442 Conjunctival cysts, left eye: Secondary | ICD-10-CM | POA: Diagnosis not present

## 2017-04-21 DIAGNOSIS — E039 Hypothyroidism, unspecified: Secondary | ICD-10-CM

## 2017-04-21 LAB — HM DIABETES EYE EXAM

## 2017-04-24 ENCOUNTER — Other Ambulatory Visit: Payer: Self-pay | Admitting: *Deleted

## 2017-04-24 DIAGNOSIS — M7989 Other specified soft tissue disorders: Secondary | ICD-10-CM

## 2017-04-24 DIAGNOSIS — K219 Gastro-esophageal reflux disease without esophagitis: Secondary | ICD-10-CM

## 2017-04-24 DIAGNOSIS — I739 Peripheral vascular disease, unspecified: Secondary | ICD-10-CM

## 2017-04-24 DIAGNOSIS — M79662 Pain in left lower leg: Secondary | ICD-10-CM

## 2017-04-24 DIAGNOSIS — I10 Essential (primary) hypertension: Secondary | ICD-10-CM

## 2017-04-24 DIAGNOSIS — E118 Type 2 diabetes mellitus with unspecified complications: Secondary | ICD-10-CM

## 2017-04-24 MED ORDER — AMLODIPINE BESYLATE 10 MG PO TABS: 10.0000 mg | ORAL_TABLET | Freq: Every day | ORAL | 1 refills | Status: DC

## 2017-04-24 MED ORDER — FUROSEMIDE 20 MG PO TABS
20.0000 mg | ORAL_TABLET | Freq: Every day | ORAL | 1 refills | Status: DC
Start: 2017-04-24 — End: 2017-06-10

## 2017-04-24 MED ORDER — INSULIN GLARGINE 100 UNIT/ML ~~LOC~~ SOLN: 20.0000 [IU] | Freq: Every day | SUBCUTANEOUS | 0 refills | Status: DC

## 2017-04-24 MED ORDER — PANTOPRAZOLE SODIUM 40 MG PO TBEC: 40.0000 mg | DELAYED_RELEASE_TABLET | Freq: Every day | ORAL | 1 refills | Status: DC

## 2017-04-24 MED ORDER — HYDROCHLOROTHIAZIDE 12.5 MG PO TABS: 25.0000 mg | ORAL_TABLET | Freq: Every day | ORAL | 1 refills | Status: DC

## 2017-04-24 MED ORDER — CLOPIDOGREL BISULFATE 75 MG PO TABS: 75.0000 mg | ORAL_TABLET | Freq: Every day | ORAL | 1 refills | Status: DC

## 2017-04-24 NOTE — Telephone Encounter (Signed)
insulin glargine (LANTUS) 100 UNIT/ML injection, Refill request @ [facility].

## 2017-04-27 ENCOUNTER — Other Ambulatory Visit: Payer: Self-pay | Admitting: Internal Medicine

## 2017-04-27 ENCOUNTER — Other Ambulatory Visit: Payer: Self-pay

## 2017-04-27 ENCOUNTER — Encounter (INDEPENDENT_AMBULATORY_CARE_PROVIDER_SITE_OTHER): Payer: Self-pay

## 2017-04-27 ENCOUNTER — Encounter: Payer: Self-pay | Admitting: *Deleted

## 2017-04-27 ENCOUNTER — Ambulatory Visit (INDEPENDENT_AMBULATORY_CARE_PROVIDER_SITE_OTHER): Payer: Medicare HMO | Admitting: Internal Medicine

## 2017-04-27 ENCOUNTER — Ambulatory Visit (HOSPITAL_COMMUNITY)
Admission: RE | Admit: 2017-04-27 | Discharge: 2017-04-27 | Disposition: A | Discharge status: Home / Self Care | Payer: Medicare HMO | Source: Ambulatory Visit | Attending: Internal Medicine | Admitting: Internal Medicine

## 2017-04-27 ENCOUNTER — Ambulatory Visit (HOSPITAL_BASED_OUTPATIENT_CLINIC_OR_DEPARTMENT_OTHER)
Admission: RE | Admit: 2017-04-27 | Discharge: 2017-04-27 | Disposition: A | Discharge status: Home / Self Care | Payer: Medicare HMO | Source: Ambulatory Visit | Attending: Physical Medicine and Rehabilitation | Admitting: Physical Medicine and Rehabilitation

## 2017-04-27 VITALS — BP 129/70 | HR 95 | Temp 98.2°F

## 2017-04-27 DIAGNOSIS — I70203 Unspecified atherosclerosis of native arteries of extremities, bilateral legs: Secondary | ICD-10-CM | POA: Diagnosis not present

## 2017-04-27 DIAGNOSIS — I739 Peripheral vascular disease, unspecified: Secondary | ICD-10-CM | POA: Diagnosis not present

## 2017-04-27 DIAGNOSIS — Z95820 Peripheral vascular angioplasty status with implants and grafts: Secondary | ICD-10-CM

## 2017-04-27 DIAGNOSIS — K219 Gastro-esophageal reflux disease without esophagitis: Secondary | ICD-10-CM

## 2017-04-27 DIAGNOSIS — Z9119 Patient's noncompliance with other medical treatment and regimen: Secondary | ICD-10-CM

## 2017-04-27 DIAGNOSIS — I10 Essential (primary) hypertension: Secondary | ICD-10-CM

## 2017-04-27 DIAGNOSIS — E1151 Type 2 diabetes mellitus with diabetic peripheral angiopathy without gangrene: Secondary | ICD-10-CM | POA: Diagnosis not present

## 2017-04-27 DIAGNOSIS — R6889 Other general symptoms and signs: Secondary | ICD-10-CM | POA: Diagnosis not present

## 2017-04-27 DIAGNOSIS — M79604 Pain in right leg: Secondary | ICD-10-CM

## 2017-04-27 DIAGNOSIS — Z720 Tobacco use: Secondary | ICD-10-CM | POA: Diagnosis not present

## 2017-04-27 DIAGNOSIS — M79605 Pain in left leg: Secondary | ICD-10-CM | POA: Diagnosis not present

## 2017-04-27 NOTE — Progress Notes (Signed)
   CC: follow-up of RLE pain  HPI:  [name] is a 67 y.o. F with uncontrolled DM2, HTN, GERD, PAD and ongoing tobacco use who presents today requesting pain medication until she is able to have vascular studies in May.  She complains of constant BL LE pain R>L which feels similar to prior LLE-PAD treated previously with stenting of SFA. Pain radiates from right hip down legs and describes it as sharp, cramping and throbbing. Pain is constant and has found nothing that relieves this pain, although the "burning" component was improved with increased Gabapentin dose.   For details regarding today's visit and the status of their chronic medical issues, please refer to the assessment and plan.  Past Medical History:  Diagnosis Date  . Allergy   . Anal fissure   . Anxiety   . Cataract   . Chronic headaches   . Depression   . Diverticulosis   . GERD (gastroesophageal reflux disease)   . Glaucoma   . HLD (hyperlipidemia)   . Hypertension   . Hypothyroidism   . PAD (peripheral artery disease) ([place])   . PUD (peptic ulcer disease)   . TIA (transient ischemic attack)    Review of Systems:   General: Denies fevers, chills, Cardiac: Denies CP, SOB Extremities: Admits to pain and swelling  Physical Exam: General: Alert. Labile mood. Hostile and unreasonable.  HEENT: No icterus or ptosis. No hoarseness or dysarthria  Cardiac: RRR, no Murmur Pulmonary: CTA BL with normal WOB on RA. Able to speak in complete sentences Extremities: BL LE warm, RLE cooler to touch though. No necrotic ulcerations RLE or diffuse skin mottling.    Vitals:   04/27/17 1338  BP: 129/70  Pulse: 95  Temp: 98.2 F (36.8 C)  TempSrc: Oral   There is no height or weight on file to calculate BMI.  Assessment & Plan:   See Encounters Tab for problem based charting.  Patient seen with Dr. Angelia Mould

## 2017-04-27 NOTE — Assessment & Plan Note (Addendum)
Assessment: Patient demanding pain medications until she is able to complete studies ordered by [facility] in May. Chart review shows she cancelled her [facility] appointments earlier this month on 4/5 (imaging and office visits). She describes constant RLE pain with several different qualities including burning, stabbing and throbbing characteristics. Exam with perfused extremities although right cooler to touch but without mottling or necrotic ulcerations.  We discussed importance of not "masking" the pain and risk of PAD without evaluating her blood flow due to risk of limb loss however patient adamant that "I just came here for pain meds until I can get to those appointments. If you do studies, and they do show a blockage, what are you going to do about it - you're not a surgeon" in addition to other statements. She was quite hostile during the visit with both RN, resident and attending  Plan: We were actually able to arrange STAT ABI/Dopplers today which were originally ordered by [facility] and re-scheduled for early May. She was taken to radiology from clinic to complete these. On arrival, she refused these studies, citing "she had already had these done before" and didn't understand why these were being repeated and why the studies she was supposed to get weren't done. She left from radiology and was not prescribed pain medication.  I'm unsure of the reason behind her hostility and non-compliance with standard work-up. Not a vascular emergency currently, and she is refusing the work-up regardless. Fortunately, she has these studies already scheduled and also has follow-up with [facility]. In addition, her MRI is scheduled for next week.  -MRI 4/23 -[facility] studies and appt in early may

## 2017-04-27 NOTE — Progress Notes (Signed)
Internal Medicine Clinic Attending  I saw and evaluated the patient.  I personally confirmed the key portions of the history and exam documented by Dr. Danford Bad and I reviewed pertinent patient test results.  The assessment, diagnosis, and plan were formulated together and I agree with the documentation in the resident's note.  Patient with history of PAD, presents for bilateral leg pain.  Difficult to ascertain history from the patient, from my direct questions, pain is severe, bilateral. Starts in the hips and radiates down to legs, pain described as sharp as well as cramping.  Worse in the right leg, compared to the left, Patient feels pain is similar to previous PAD.  She notes pain is constant, not relieved by dependent positioning.  Pain is also not better with bending of back (walking with shopping cart).  Patient does not feel gabapentin helps much.  In review of her chart, PAD in current smoker with stenting of left iliac and SFA stenting. Has had POC ABI with b/l ABI reading "PAD", she followed up with Dr Donzetta Matters in early January who reviewed some outside ABI (I cannot seem to find this exact study) and planned to follow up in 3 months. She however canceled her Appointment with Dr Donzetta Matters on 4/5. And it is now rescheduled for 5/10.  She was seen in our clinic about 3 weeks ago for this same issue, given her unclear total picture a MRI was ordered but not yet complete (scheduled for next week).  I discussed with her my assessment that she is at risk for PAD and has some concerning findings however her exam is nonspecific.  I discussed that we could obtain STAT ABI that would help our decision making, however she became very displeased with this.  She stated "I came here for pain medications, that's all you can do, you cant do surgery."  She stated her goal was to get pain medication until her appointment with vascular.  However we were able to get her arranged for STAT ABI's today, she was agreeable to this  however visible displeased that I did not conform to her plan.

## 2017-04-27 NOTE — Progress Notes (Signed)
Preliminary results by tech - ABI completed. Positive for bilateral moderate arterial disease at rest. Right ABI is 0.59 and left ABI is 0.69 with monophasic waveforms bilaterally. Oda Cogan, BS, RDMS, RVT

## 2017-04-28 ENCOUNTER — Telehealth: Payer: Self-pay | Admitting: *Deleted

## 2017-04-28 NOTE — Telephone Encounter (Signed)
Fax from [employer] -Doxepin is not covered by patient's insurance.  Maprotiline 75 mg tablets is covered.  Message was sent to  [name] to consider a change to the covered medication.  Sander Nephew, RN 04/28/2017 12:05 PM.

## 2017-04-29 ENCOUNTER — Other Ambulatory Visit: Payer: Self-pay | Admitting: *Deleted

## 2017-04-29 NOTE — Telephone Encounter (Signed)
Call made to patient informing her that her chart is still being reviewed and that it may be tomorrow before I get a response from the MD.[name]4/17/20194:40 PM

## 2017-04-29 NOTE — Telephone Encounter (Addendum)
ADDENDUM: She initially refused ABIs at our visit on 4/15, it appears these have now been completed.  She  Does have diffused PAD and this is reduced from her previous ABI's in the system from 2017.  We will need to discuss this result with Dr Donzetta Matters to see if a more urgent intervention is needed.

## 2017-04-29 NOTE — Telephone Encounter (Addendum)
Call from patient requesting results of ABIs-pt still upset about having to have a test that she has already had in the past, one that her insurance would not pay for and will leave her with the bill.  Pt again asking for 2wk supply of pain meds to help her function. States she has difficulty walking and moving around due to the pain. Will send request to MD that saw pt yesterday for review, please advise.[name]4/17/20193:38 PM

## 2017-04-29 NOTE — Telephone Encounter (Signed)
Sent Dr. Dareen Piano a message to confirm if okay to change from doxepin to maprotiline. Did not see any contraindications. Awaiting response.

## 2017-04-29 NOTE — Telephone Encounter (Signed)
Thanks for letting me know. Unfortunately they require those tests to be repeated prior to vascular evaluation, even if they were done relatively recently. Fortunately there is not a critical blockage at this point. Will route to attending for advise on pain medication.

## 2017-04-30 ENCOUNTER — Encounter: Payer: Self-pay | Admitting: *Deleted

## 2017-04-30 ENCOUNTER — Encounter: Payer: Self-pay | Admitting: Internal Medicine

## 2017-04-30 ENCOUNTER — Other Ambulatory Visit: Payer: Self-pay | Admitting: Internal Medicine

## 2017-04-30 MED ORDER — ROSUVASTATIN CALCIUM 20 MG PO TABS: 20.0000 mg | ORAL_TABLET | Freq: Every day | ORAL | 11 refills | Status: DC

## 2017-04-30 NOTE — Telephone Encounter (Signed)
Awaiting callback from [facility] re: earlier appointment and pain medications. Will keep you guys in the loop.

## 2017-04-30 NOTE — Telephone Encounter (Signed)
A user error has taken place: encounter opened in error, closed for administrative reasons.

## 2017-05-05 ENCOUNTER — Ambulatory Visit (HOSPITAL_COMMUNITY): Payer: Medicare HMO

## 2017-05-13 ENCOUNTER — Telehealth: Payer: Self-pay | Admitting: *Deleted

## 2017-05-13 NOTE — Telephone Encounter (Signed)
Call from pt - upset by her last visit. Stated she was here for something for her pain which she did not get. Also stated Crestor was prescribed but she's allergic to this med. Also she wants all her meds refilled and sent to [facility]. Reminded pt she has an appt to see her PCP on Friday - she will discuss her issues then.

## 2017-05-13 NOTE — Telephone Encounter (Signed)
Thank you for letting me know. We will take of it during her visit on Friday 05/15/17

## 2017-05-15 ENCOUNTER — Ambulatory Visit (INDEPENDENT_AMBULATORY_CARE_PROVIDER_SITE_OTHER): Payer: Medicare HMO | Admitting: Internal Medicine

## 2017-05-15 ENCOUNTER — Other Ambulatory Visit: Payer: Self-pay

## 2017-05-15 VITALS — BP 126/70 | HR 96 | Temp 98.2°F | Ht 62.0 in | Wt 211.0 lb

## 2017-05-15 DIAGNOSIS — Z794 Long term (current) use of insulin: Secondary | ICD-10-CM | POA: Diagnosis not present

## 2017-05-15 DIAGNOSIS — K279 Peptic ulcer, site unspecified, unspecified as acute or chronic, without hemorrhage or perforation: Secondary | ICD-10-CM | POA: Diagnosis not present

## 2017-05-15 DIAGNOSIS — E1151 Type 2 diabetes mellitus with diabetic peripheral angiopathy without gangrene: Secondary | ICD-10-CM | POA: Diagnosis not present

## 2017-05-15 DIAGNOSIS — I10 Essential (primary) hypertension: Secondary | ICD-10-CM | POA: Diagnosis not present

## 2017-05-15 DIAGNOSIS — Z87448 Personal history of other diseases of urinary system: Secondary | ICD-10-CM | POA: Diagnosis not present

## 2017-05-15 DIAGNOSIS — R319 Hematuria, unspecified: Secondary | ICD-10-CM

## 2017-05-15 DIAGNOSIS — I739 Peripheral vascular disease, unspecified: Secondary | ICD-10-CM

## 2017-05-15 DIAGNOSIS — Z888 Allergy status to other drugs, medicaments and biological substances status: Secondary | ICD-10-CM | POA: Diagnosis not present

## 2017-05-15 DIAGNOSIS — Z8673 Personal history of transient ischemic attack (TIA), and cerebral infarction without residual deficits: Secondary | ICD-10-CM

## 2017-05-15 DIAGNOSIS — E785 Hyperlipidemia, unspecified: Secondary | ICD-10-CM

## 2017-05-15 DIAGNOSIS — R1013 Epigastric pain: Secondary | ICD-10-CM

## 2017-05-15 DIAGNOSIS — Z79899 Other long term (current) drug therapy: Secondary | ICD-10-CM | POA: Diagnosis not present

## 2017-05-15 DIAGNOSIS — Z0000 Encounter for general adult medical examination without abnormal findings: Secondary | ICD-10-CM

## 2017-05-15 DIAGNOSIS — E039 Hypothyroidism, unspecified: Secondary | ICD-10-CM

## 2017-05-15 DIAGNOSIS — F411 Generalized anxiety disorder: Secondary | ICD-10-CM

## 2017-05-15 DIAGNOSIS — E782 Mixed hyperlipidemia: Secondary | ICD-10-CM

## 2017-05-15 LAB — POCT GLYCOSYLATED HEMOGLOBIN (HGB A1C): Hemoglobin A1C: 8.1

## 2017-05-15 LAB — GLUCOSE, CAPILLARY: Glucose-Capillary: 99 mg/dL (ref 65–99)

## 2017-05-15 MED ORDER — ATORVASTATIN CALCIUM 40 MG PO TABS: 40.0000 mg | ORAL_TABLET | Freq: Every day | ORAL | 2 refills | Status: DC

## 2017-05-15 MED ORDER — CELECOXIB 100 MG PO CAPS: 100.0000 mg | ORAL_CAPSULE | Freq: Every day | ORAL | 0 refills | Status: AC

## 2017-05-15 MED ORDER — SERTRALINE HCL 25 MG PO TABS: 25.0000 mg | ORAL_TABLET | Freq: Every day | ORAL | 1 refills | Status: DC

## 2017-05-15 MED ORDER — CILOSTAZOL 100 MG PO TABS
100.0000 mg | ORAL_TABLET | Freq: Two times a day (BID) | ORAL | 2 refills | Status: AC
Start: 2017-05-15 — End: 2017-06-14

## 2017-05-15 NOTE — Progress Notes (Signed)
CC: Peripheral artery disease and Diabetes follow up   HPI:  [name] is a 67 y.o. f with hypertension, hypothyroidism, PAD, PUD, hx of TIA who presents to be seen for diabetes follow up. Please see problem based charting for evaluation, assessment, and plan.   Past Medical History:  Diagnosis Date  . Allergy   . Anal fissure   . Anxiety   . Cataract   . Chronic headaches   . Depression   . Diverticulosis   . GERD (gastroesophageal reflux disease)   . Glaucoma   . HLD (hyperlipidemia)   . Hypertension   . Hypothyroidism   . PAD (peripheral artery disease) ([place])   . PUD (peptic ulcer disease)   . TIA (transient ischemic attack)    Review of Systems:  Dizziness when she doesn't eat, lower extremity pain   Physical Exam:  Vitals:   05/15/17 1448  BP: 126/70  Pulse: 96  Temp: 98.2 F (36.8 C)  TempSrc: Oral  SpO2: 99%  Weight: 211 lb (95.7 kg)  Height: 5\' 2"  (1.575 m)   Physical Exam  Constitutional: She appears well-developed and well-nourished. No distress.  HENT:  Head: Normocephalic and atraumatic.  Cardiovascular: Normal rate, regular rhythm and normal heart sounds.  Respiratory: Effort normal and breath sounds normal. No respiratory distress. She has no wheezes.  GI: Soft. Bowel sounds are normal. She exhibits no distension. There is tenderness (epigastric).  Musculoskeletal: She exhibits no edema.  Neurological: She is alert.  Skin: She is not diaphoretic. No erythema.  Psychiatric: She has a normal mood and affect. Her behavior is normal. Judgment and thought content normal.     Assessment & Plan:   See Encounters Tab for problem based charting.  Diabetes Mellitus Type 2 The patient is taking metformin 500mg  bid, lantus 20u qhs, . The patient's  A1c=8.1 during this visit. The patient's home blood glucose measurements over the past month have ranged 80-120. The patient  States that she has been having some symptoms of hypoglycemia when she  doesn't eat. She has been compliant with her medications.   -Continue current diabetes regimen   Essential hypertension The patient's blood pressure during this visit was 126/70. She is currently taking amlodipine 10mg  qd, hctz 25mg  bid.  -Recommended continuing current regimen due to being in normal range  Hyperlipidemia  The patient was started on crestor at previous acc visit. She is allergic to crestor.  -Stopped crestor  -Started patient on Lipitor 40mg   -Ordered lipid panel: tcholes=232, trig=369, ldl=124, hdl=34. The patient has a 14.% risk of MI or death based on the Framingham risk score.  Anxiety Received notice from patient's insurance stating that [facility] will no longer cover the patient's doxepin. Did GAD 7 scale which she scored 12 on. Patient stated that she does not know of any side effects from ssri and upon chart review no side effects were noted either.  -Started patient on sertraline 25mg  qd  Hematuria The patient mentioned that she has noticed some blood in her urine a few months ago, but not recently. She did not note any dysuria or burning sensation.   -Urinalysis showed no leukocytes and nitrities, negative rbc, some ketones   Health Maintenance Microalbubin/creatinine ratio was normal  Mammogram  Peripheral artery disease The patient presents with continued lower extremity pain bilaterally. ABI's at previous visit showed right=0.59 and left =0.69.   -Patient to follow with vascular surgery  -Started cilastazol 100mg  bid  -celebrex 100mg  qd for her  pain   Patient Discussed with Dr. Angelia Mould

## 2017-05-15 NOTE — Patient Instructions (Signed)
It was a pleasure to see you today Ms. Guaman. Please make the following changes:  -Please start taking zoloft 25mg  qd -Please take celebrex for your pain -Please get mammogram done -Please follow up in 2 months  If you have any questions or concerns, please call our clinic at [phone] between 9am-5pm and after hours call [phone] and ask for the internal medicine resident on call. If you feel you are having a medical emergency please call 911.   Thank you, we look forward to help you remain healthy!  Lars Mage, MD Internal Medicine PGY1

## 2017-05-16 DIAGNOSIS — R319 Hematuria, unspecified: Secondary | ICD-10-CM | POA: Insufficient documentation

## 2017-05-16 LAB — MICROSCOPIC EXAMINATION: Casts: NONE SEEN /lpf

## 2017-05-16 LAB — LIPID PANEL
Chol/HDL Ratio: 6.8 ratio — ABNORMAL HIGH (ref 0.0–4.4)
Cholesterol, Total: 232 mg/dL — ABNORMAL HIGH (ref 100–199)
HDL: 34 mg/dL — ABNORMAL LOW (ref >39)
LDL Calculated: 124 mg/dL — ABNORMAL HIGH (ref 0–99)
Triglycerides: 369 mg/dL — ABNORMAL HIGH (ref 0–149)
VLDL Cholesterol Cal: 74 mg/dL — ABNORMAL HIGH (ref 5–40)

## 2017-05-16 LAB — MICROALBUMIN / CREATININE URINE RATIO
Creatinine, Urine: 202.3 mg/dL
Microalb/Creat Ratio: 6.4 mg/g creat (ref 0.0–30.0)
Microalbumin, Urine: 13 ug/mL

## 2017-05-16 LAB — URINALYSIS, COMPLETE
Bilirubin, UA: NEGATIVE
Glucose, UA: NEGATIVE
Leukocytes, UA: NEGATIVE
Nitrite, UA: NEGATIVE
Protein, UA: NEGATIVE
RBC, UA: NEGATIVE
Specific Gravity, UA: 1.02 (ref 1.005–1.030)
Urobilinogen, Ur: 0.2 mg/dL (ref 0.2–1.0)
pH, UA: 5.5 (ref 5.0–7.5)

## 2017-05-16 NOTE — Assessment & Plan Note (Signed)
The patient was started on crestor at previous acc visit. She is allergic to crestor.  -Stopped crestor  -Started patient on Lipitor 40mg   -Ordered lipid panel: tcholes=232, trig=369, ldl=124, hdl=34. The patient has a 14.% risk of MI or death based on the Framingham risk score.

## 2017-05-16 NOTE — Assessment & Plan Note (Signed)
The patient mentioned that she has noticed some blood in her urine a few months ago, but not recently. She did not note any dysuria or burning sensation.   -Urinalysis showed no leukocytes and nitrities, negative rbc, some ketones

## 2017-05-16 NOTE — Assessment & Plan Note (Signed)
Received notice from patient's insurance stating that [facility] will no longer cover the patient's doxepin. Did GAD 7 scale which she scored 12 on. Patient stated that she does not know of any side effects from ssri and upon chart review no side effects were noted either.  -Started patient on sertraline 25mg  qd

## 2017-05-16 NOTE — Assessment & Plan Note (Signed)
Microalbumin/creatinine ratio normal  Mammogram ordered

## 2017-05-16 NOTE — Assessment & Plan Note (Signed)
The patient's blood pressure during this visit was 126/70. She is currently taking amlodipine 10mg  qd, hctz 25mg  bid.  -Recommended continuing current regimen due to being in normal range

## 2017-05-16 NOTE — Assessment & Plan Note (Signed)
The patient presents with continued lower extremity pain bilaterally. ABI's at previous visit showed right=0.59 and left =0.69.   -Patient to follow with vascular surgery  -Started cilastazol 100mg  bid  -celebrex 100mg  qd for her pain

## 2017-05-16 NOTE — Assessment & Plan Note (Signed)
The patient is taking metformin 500mg  bid, lantus 20u qhs, . The patient's  A1c=8.1 during this visit. The patient's home blood glucose measurements over the past month have ranged 80-120. The patient  States that she has been having some symptoms of hypoglycemia when she doesn't eat. She has been compliant with her medications.   -Continue current diabetes regimen

## 2017-05-18 ENCOUNTER — Other Ambulatory Visit: Payer: Self-pay | Admitting: Internal Medicine

## 2017-05-18 DIAGNOSIS — E1169 Type 2 diabetes mellitus with other specified complication: Secondary | ICD-10-CM

## 2017-05-18 DIAGNOSIS — E669 Obesity, unspecified: Secondary | ICD-10-CM

## 2017-05-18 DIAGNOSIS — M5431 Sciatica, right side: Secondary | ICD-10-CM

## 2017-05-18 DIAGNOSIS — M5432 Sciatica, left side: Principal | ICD-10-CM

## 2017-05-18 NOTE — Telephone Encounter (Signed)
Pt seen by pcp on 05/15/17

## 2017-05-19 ENCOUNTER — Other Ambulatory Visit: Payer: Self-pay | Admitting: Internal Medicine

## 2017-05-19 ENCOUNTER — Other Ambulatory Visit: Payer: Self-pay | Admitting: *Deleted

## 2017-05-19 ENCOUNTER — Other Ambulatory Visit: Payer: Self-pay

## 2017-05-19 DIAGNOSIS — Z1231 Encounter for screening mammogram for malignant neoplasm of breast: Secondary | ICD-10-CM

## 2017-05-19 DIAGNOSIS — M5431 Sciatica, right side: Secondary | ICD-10-CM

## 2017-05-19 DIAGNOSIS — M5432 Sciatica, left side: Principal | ICD-10-CM

## 2017-05-19 MED ORDER — GLUCOSE BLOOD VI STRP: ORAL_STRIP | 1 refills | Status: DC

## 2017-05-19 NOTE — Patient Outreach (Signed)
[facility]) Care Management   05/19/2017  TRACINA BEAUMONT 1950/11/10 [id number]  [name] is an 67 y.o. female  Subjective:   Member alert and oriented x3, complains of right leg pain. She report she was seen by her primary MD on Friday and was provided medication for pain, but no relief.  State she will see her vein specialist this week, hoping to have a plan for pain control.    Objective:   Review of Systems  Constitutional: Negative.   HENT: Negative.   Eyes: Negative.   Respiratory: Negative.   Cardiovascular: Negative.   Gastrointestinal: Negative.   Genitourinary: Negative.   Musculoskeletal: Negative.   Skin: Negative.   Neurological: Negative.   Endo/Heme/Allergies: Negative.   Psychiatric/Behavioral: Negative.     Physical Exam  Constitutional: She is oriented to person, place, and time. She appears well-developed and well-nourished.  Neck: Normal range of motion.  Cardiovascular: Normal rate, regular rhythm and normal heart sounds.  Respiratory: Effort normal and breath sounds normal.  GI: Soft. Bowel sounds are normal.  Musculoskeletal: Normal range of motion.  Neurological: She is alert and oriented to person, place, and time.  Skin: Skin is warm and dry.   BP 118/80 (BP Location: Left Arm, Patient Position: Sitting, Cuff Size: Normal)   Pulse 81   Resp 18   SpO2 97%   Encounter Medications:   Outpatient Encounter Medications as of 05/19/2017  Medication Sig  . amLODipine (NORVASC) 10 MG tablet Take 1 tablet (10 mg total) by mouth daily.  Marland Kitchen atorvastatin (LIPITOR) 40 MG tablet Take 1 tablet (40 mg total) by mouth daily.  . celecoxib (CELEBREX) 100 MG capsule Take 1 capsule (100 mg total) by mouth daily.  . cilostazol (PLETAL) 100 MG tablet Take 1 tablet (100 mg total) by mouth 2 (two) times daily.  . clopidogrel (PLAVIX) 75 MG tablet Take 1 tablet (75 mg total) by mouth daily. For Peripheral Arterial disease  . furosemide (LASIX)  20 MG tablet Take 1 tablet (20 mg total) by mouth daily.  Marland Kitchen gabapentin (NEURONTIN) 400 MG capsule TAKE 1 CAPSULE BY MOUTH EVERY MORNING, 1 CAPSULE AT LUNCH AND 2 CAPSULES AT BEDTIME  . glucose blood (ACCU-CHEK AVIVA) test strip Use 3 times daily to check blood sugar. 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 Box of 100 equals 1 month testing  . hydrochlorothiazide (HYDRODIURIL) 12.5 MG tablet Take 2 tablets (25 mg total) by mouth daily.  . insulin glargine (LANTUS) 100 UNIT/ML injection Inject 0.2 mLs (20 Units total) into the skin at bedtime.  . Insulin Syringe-Needle U-100 31G X 15/64" 0.3 ML MISC Use to inject insulin daily  . levothyroxine (SYNTHROID, LEVOTHROID) 112 MCG tablet TAKE 1 TABLET BY MOUTH ONCE DAILY BEFORE BREAKFAST  . levothyroxine (SYNTHROID, LEVOTHROID) 112 MCG tablet TAKE 1 TABLET BY MOUTH ONCE DAILY BEFORE BREAKFAST  . metFORMIN (GLUCOPHAGE-XR) 500 MG 24 hr tablet TAKE 1 TABLET(500 MG) BY MOUTH EVERY MORNING FOR 1 WEEK. INCREASE TO 1 TABLET IN THE MORNING 500 MG AND 1 TABLET AT NIGHT 500 MG  . pantoprazole (PROTONIX) 40 MG tablet Take 1 tablet (40 mg total) by mouth daily.  . nicotine polacrilex (NICORETTE) 2 MG gum Take 1 each (2 mg total) by mouth as needed for smoking cessation. (Patient not taking: Reported on 03/11/2017)  . rosuvastatin (CRESTOR) 20 MG tablet Take 1 tablet (20 mg total) by mouth at bedtime. (Patient not taking: Reported on 05/19/2017)  . sertraline (ZOLOFT) 25 MG tablet Take  1 tablet (25 mg total) by mouth daily. (Patient not taking: Reported on 05/19/2017)   No facility-administered encounter medications on file as of 05/19/2017.     Functional Status:   In your present state of health, do you have any difficulty performing the following activities: 05/15/2017 04/27/2017  Hearing? Y Y  Comment - left ear  Vision? N N  Difficulty concentrating or making decisions? N N  Walking or climbing stairs? Y Y  Dressing or bathing? Y Y  Comment - bathing 2/2 difficulty  standing  Doing errands, shopping? Y Y  Preparing Food and eating ? - -  Using the Toilet? - -  In the past six months, have you accidently leaked urine? - -  Do you have problems with loss of bowel control? - -  Managing your Medications? - -  Managing your Finances? - -  Housekeeping or managing your Housekeeping? - -  Some recent data might be hidden    Fall/Depression Screening:    Fall Risk  05/15/2017 04/27/2017 03/27/2017  Falls in the past year? Yes Yes Yes  Comment - - -  Number falls in past yr: 2 or more 2 or more 2 or more  Comment - - -  Injury with Fall? No No Yes  Comment - - -  Risk Factor Category  High Fall Risk High Fall Risk High Fall Risk  Risk for fall due to : History of fall(s);Impaired balance/gait;Impaired mobility History of fall(s);Impaired balance/gait;Impaired mobility History of fall(s);Impaired balance/gait  Risk for fall due to: Comment - Pain in legs -  Follow up - - Falls prevention discussed   PHQ 2/9 Scores 05/15/2017 04/27/2017 03/27/2017 03/12/2017 03/10/2017 01/21/2017 12/12/2016  PHQ - 2 Score _0 PHQ- 9 Score 7 7 - - - 13 12    Assessment:    Met with member at scheduled time.  Blood sugars remain under control, with occasional highs in the 200s.  She is aware that her A1C has decreased, encouraged to continue diabetic diet and medication management.  State all of her medications will start to come from [facility] next month.    She completed testing on her legs, will have follow up this week.  MRI of spine (intermittent back pain) scheduled for next week.  Denies any urgent concerns at this time.   Encouraged to contact this care manager with questions.  Plan:   Will follow up within the next 2 weeks.  If remain stable, will transition to health coach.  THN CM Care Plan Problem One     Most Recent Value  Care Plan Problem One  Knowledge deficit related to management of diabetes as evidenced by elevated A1C  Role Documenting the  Problem One  Care Management [place] for Problem One  Not Active  THN Long Term Goal   Member's A1C will be decreased </= 11 within the next 2 months  THN Long Term Goal Start Date  04/14/17  [facility] Long Term Goal Met Date  05/19/17  [facility] CM Short Term Goal #1   Member will report completed visit with eye MD within the next 4 weeks  [facility]N CM Short Term Goal #1 Start Date  04/14/17  [facility] CM Short Term Goal #1 Met Date  05/19/17  [facility] CM Short Term Goal #2   Member will report taking medications as prescribed over the next 4 weeks  THN CM Short Term Goal #2 Start Date  04/14/17  [facility] CM Short Term Goal #2 Met Date  05/19/17     Valente David, RN, MSN [place] Manager [phone]

## 2017-05-19 NOTE — Progress Notes (Signed)
Internal Medicine Clinic Attending  Case discussed with Dr. Chundi at the time of the visit.  We reviewed the resident's history and exam and pertinent patient test results.  I agree with the assessment, diagnosis, and plan of care documented in the resident's note. 

## 2017-05-20 ENCOUNTER — Encounter (HOSPITAL_COMMUNITY): Payer: Self-pay

## 2017-05-20 ENCOUNTER — Inpatient Hospital Stay (HOSPITAL_COMMUNITY): Admission: RE | Admit: 2017-05-20 | Payer: Self-pay | Source: Ambulatory Visit

## 2017-05-22 ENCOUNTER — Ambulatory Visit (INDEPENDENT_AMBULATORY_CARE_PROVIDER_SITE_OTHER): Payer: Medicare HMO | Admitting: Vascular Surgery

## 2017-05-22 ENCOUNTER — Encounter: Payer: Self-pay | Admitting: Vascular Surgery

## 2017-05-22 ENCOUNTER — Encounter: Payer: Self-pay | Admitting: *Deleted

## 2017-05-22 ENCOUNTER — Other Ambulatory Visit: Payer: Self-pay | Admitting: *Deleted

## 2017-05-22 ENCOUNTER — Other Ambulatory Visit: Payer: Self-pay

## 2017-05-22 VITALS — BP 130/92 | HR 93 | Temp 98.2°F | Resp 20 | Ht 62.0 in | Wt 212.0 lb

## 2017-05-22 DIAGNOSIS — R6889 Other general symptoms and signs: Secondary | ICD-10-CM | POA: Diagnosis not present

## 2017-05-22 DIAGNOSIS — I739 Peripheral vascular disease, unspecified: Secondary | ICD-10-CM | POA: Diagnosis not present

## 2017-05-22 NOTE — Progress Notes (Signed)
Patient ID: [name], female   DOB: 03/08/1950, 67 y.o.   MRN: [id number]  Reason for Consult: PAD (3 mth f/u ABI, LE art bilat 04/27/17 at [facility] hospital.)   Referred by Lars Mage, MD  Subjective:     HPI:  [name] is a 67 y.o. female last evaluated by me in January of this year.  She has history of left SFA stent in 2013 at [facility] and more recently Dr. Tacy Learn performed stenting of her left external iliac artery with 2 stents 2 years ago.  She at that time was having left lower extremity pain that has mostly resolved.  She now presents with persistent right lower extremity pain that occurs with sitting in certain positions and is exacerbated with walking or with any pressure at all.  She cannot take aspirin she does take Plavix.  She does occasionally smoke.  She has no previous heart disease.  She does have associated leg swelling.  She also has numbness that is progressive in her bilateral feet and was recently diagnosed as diabetic.  She denies tissue loss or ulceration.  Past Medical History:  Diagnosis Date  . Allergy   . Anal fissure   . Anxiety   . Cataract   . Chronic headaches   . Depression   . Diverticulosis   . GERD (gastroesophageal reflux disease)   . Glaucoma   . HLD (hyperlipidemia)   . Hypertension   . Hypothyroidism   . PAD (peripheral artery disease) ([place])   . PUD (peptic ulcer disease)   . TIA (transient ischemic attack)    Family History  Problem Relation Age of Onset  . Peripheral vascular disease Mother   . Heart disease Mother   . Hypertension Mother   . AAA (abdominal aortic aneurysm) Father   . Hypertension Father   . Aneurysm Father   . Uterine cancer Sister        mets to brain  . Liver cancer Brother   . Stomach cancer Maternal Aunt        x2  . Stomach cancer Paternal Aunt   . Heart disease Brother   . Kidney cancer Brother   . Anuerysm Brother        brain  . HIV Brother        x2   Past Surgical History:    Procedure Laterality Date  . ABDOMINAL AORTAGRAM N/A 01/20/2013   Procedure: ABDOMINAL Maxcine Ham;  Surgeon: Conrad Marble, MD;  Location: [facility] CATH LAB;  Service: Cardiovascular;  Laterality: N/A;  . ANAL FISSURE REPAIR    . AORTOGRAM    . BREAST REDUCTION SURGERY    . CATARACT EXTRACTION Left   . iliac artery angioplasty and stenting Left 12/2012  . SFA stent Left 2012   at [facility]  . STOMACH SURGERY    . TOTAL THYROIDECTOMY      Short Social History:  Social History   Tobacco Use  . Smoking status: Current Some Day Smoker    Packs/day: 0.50    Years: 0.50    Pack years: 0.25    Types: Cigarettes  . Smokeless tobacco: Never Used  . Tobacco comment: 1 pack per 2 days  Substance Use Topics  . Alcohol use: No    Alcohol/week: 0.0 oz    Allergies  Allergen Reactions  . Asa [Aspirin] Nausea And Vomiting  . Crestor [Rosuvastatin Calcium] Hives and Itching  . Penicillins Hives and Itching  Has patient had a PCN reaction causing immediate rash, facial/tongue/throat swelling, SOB or lightheadedness with hypotension: Yes Has patient had a PCN reaction causing severe rash involving mucus membranes or skin necrosis: Yes Has patient had a PCN reaction that required hospitalization No Has patient had a PCN reaction occurring within the last 10 years: No If all of the above answers are "NO", then may proceed with Cephalosporin use.   . Gadolinium Derivatives Other (See Comments)    Chest tightness    Current Outpatient Medications  Medication Sig Dispense Refill  . amLODipine (NORVASC) 10 MG tablet Take 1 tablet (10 mg total) by mouth daily. 90 tablet 1  . atorvastatin (LIPITOR) 40 MG tablet Take 1 tablet (40 mg total) by mouth daily. 30 tablet 2  . celecoxib (CELEBREX) 100 MG capsule Take 1 capsule (100 mg total) by mouth daily. 30 capsule 0  . cilostazol (PLETAL) 100 MG tablet Take 1 tablet (100 mg total) by mouth 2 (two) times daily. 60 tablet 2  .  clopidogrel (PLAVIX) 75 MG tablet Take 1 tablet (75 mg total) by mouth daily. For Peripheral Arterial disease 90 tablet 1  . doxepin (SINEQUAN) 100 MG capsule Take 100 mg by mouth.    . furosemide (LASIX) 20 MG tablet Take 1 tablet (20 mg total) by mouth daily. 90 tablet 1  . gabapentin (NEURONTIN) 400 MG capsule TAKE 1 CAPSULE BY MOUTH EVERY MORNING, 1 CAPSULE AT LUNCH AND 2 CAPSULES AT BEDTIME 120 capsule 0  . glucose blood (ACCU-CHEK AVIVA) test strip Use 3 times daily to check blood sugar. 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 Box of 100 equals 1 month testing 300 each 1  . hydrochlorothiazide (HYDRODIURIL) 12.5 MG tablet Take 2 tablets (25 mg total) by mouth daily. 180 tablet 1  . insulin glargine (LANTUS) 100 UNIT/ML injection Inject 0.2 mLs (20 Units total) into the skin at bedtime. 18 mL 0  . Insulin Syringe-Needle U-100 31G X 15/64" 0.3 ML MISC Use to inject insulin daily 100 each 5  . levothyroxine (SYNTHROID, LEVOTHROID) 112 MCG tablet TAKE 1 TABLET BY MOUTH ONCE DAILY BEFORE BREAKFAST 90 tablet 1  . metFORMIN (GLUCOPHAGE-XR) 500 MG 24 hr tablet TAKE 1 TABLET(500 MG) BY MOUTH EVERY MORNING FOR 1 WEEK. INCREASE TO 1 TABLET IN THE MORNING 500 MG AND 1 TABLET AT NIGHT 500 MG 90 tablet 2  . pantoprazole (PROTONIX) 40 MG tablet Take 1 tablet (40 mg total) by mouth daily. 90 tablet 1  . sertraline (ZOLOFT) 25 MG tablet Take 1 tablet (25 mg total) by mouth daily. 30 tablet 1   No current facility-administered medications for this visit.     Review of Systems  Constitutional:  Constitutional negative. HENT: HENT negative.  Eyes: Eyes negative.  Respiratory: Respiratory negative.  Cardiovascular: Positive for leg swelling.  GI: Gastrointestinal negative.  Musculoskeletal: Positive for leg pain.  Skin: Skin negative.  Neurological: Positive for numbness.  Hematologic: Hematologic/lymphatic negative.  Psychiatric: Psychiatric negative.        Objective:  Objective   Vitals:    05/22/17 0906  BP: (!) 130/92  Pulse: 93  Resp: 20  Temp: 98.2 F (36.8 C)  TempSrc: Oral  SpO2: 95%  Weight: 212 lb (96.2 kg)  Height: 5\' 2"  (1.575 m)   Body mass index is 38.78 kg/m.  Physical Exam  Constitutional: She appears well-developed.  HENT:  Head: Normocephalic.  Eyes: Pupils are equal, round, and reactive to light.  Neck: Normal range of motion.  Cardiovascular:  Normal rate.  Pulses:      Radial pulses are 2+ on the right side, and 2+ on the left side.       Femoral pulses are 1+ on the right side, and 1+ on the left side.      Popliteal pulses are 0 on the right side, and 0 on the left side.  Monophasic AT/PT bilaterally    Data: ABI on the right 0.59 left 0.69.  Toe pressure on the right 34 on the left 40    Assessment/Plan:     67 year old female with diabetes, hypertension, hyperlipidemia and continues to smoke occasionally.  She has a bilateral lower extremity pain which is far worse on the right after stenting on the left side.  I discussed with her that this pain is likely multifactorial but given the underlying vascular insufficiency we can proceed with aortogram and bilateral lower extremity runoff and will consider intervention on the right side.  This will be from a left femoral approach.  She does have a history of renal insufficiency although her most recent creatinine was normal.  We will check it on the day of procedure and possibly consider using CO2.  We discussed the risk and benefits of intervention she demonstrates good understanding we will get her signed up for the near future.     Waynetta Sandy MD [facility]

## 2017-05-25 ENCOUNTER — Ambulatory Visit (HOSPITAL_COMMUNITY): Payer: Medicare HMO

## 2017-06-03 ENCOUNTER — Encounter (HOSPITAL_COMMUNITY)
Admission: RE | Disposition: A | Discharge status: Home / Self Care | Payer: Self-pay | Source: Ambulatory Visit | Attending: Vascular Surgery

## 2017-06-03 ENCOUNTER — Ambulatory Visit (HOSPITAL_COMMUNITY)
Admission: RE | Admit: 2017-06-03 | Discharge: 2017-06-03 | Disposition: A | Discharge status: Home / Self Care | Payer: Medicare HMO | Source: Ambulatory Visit | Attending: Vascular Surgery | Admitting: Vascular Surgery

## 2017-06-03 DIAGNOSIS — Z7901 Long term (current) use of anticoagulants: Secondary | ICD-10-CM | POA: Insufficient documentation

## 2017-06-03 DIAGNOSIS — Z8711 Personal history of peptic ulcer disease: Secondary | ICD-10-CM | POA: Insufficient documentation

## 2017-06-03 DIAGNOSIS — Z79899 Other long term (current) drug therapy: Secondary | ICD-10-CM | POA: Insufficient documentation

## 2017-06-03 DIAGNOSIS — F329 Major depressive disorder, single episode, unspecified: Secondary | ICD-10-CM | POA: Diagnosis not present

## 2017-06-03 DIAGNOSIS — Z886 Allergy status to analgesic agent status: Secondary | ICD-10-CM | POA: Diagnosis not present

## 2017-06-03 DIAGNOSIS — Z794 Long term (current) use of insulin: Secondary | ICD-10-CM | POA: Diagnosis not present

## 2017-06-03 DIAGNOSIS — Z8249 Family history of ischemic heart disease and other diseases of the circulatory system: Secondary | ICD-10-CM | POA: Diagnosis not present

## 2017-06-03 DIAGNOSIS — F17210 Nicotine dependence, cigarettes, uncomplicated: Secondary | ICD-10-CM | POA: Diagnosis not present

## 2017-06-03 DIAGNOSIS — E039 Hypothyroidism, unspecified: Secondary | ICD-10-CM | POA: Diagnosis not present

## 2017-06-03 DIAGNOSIS — Z95820 Peripheral vascular angioplasty status with implants and grafts: Secondary | ICD-10-CM | POA: Diagnosis not present

## 2017-06-03 DIAGNOSIS — I70221 Atherosclerosis of native arteries of extremities with rest pain, right leg: Secondary | ICD-10-CM | POA: Diagnosis not present

## 2017-06-03 DIAGNOSIS — Z880 Allergy status to penicillin: Secondary | ICD-10-CM | POA: Diagnosis not present

## 2017-06-03 DIAGNOSIS — Z7982 Long term (current) use of aspirin: Secondary | ICD-10-CM | POA: Diagnosis not present

## 2017-06-03 DIAGNOSIS — E119 Type 2 diabetes mellitus without complications: Secondary | ICD-10-CM | POA: Insufficient documentation

## 2017-06-03 DIAGNOSIS — Z9842 Cataract extraction status, left eye: Secondary | ICD-10-CM | POA: Insufficient documentation

## 2017-06-03 DIAGNOSIS — H269 Unspecified cataract: Secondary | ICD-10-CM | POA: Insufficient documentation

## 2017-06-03 DIAGNOSIS — F419 Anxiety disorder, unspecified: Secondary | ICD-10-CM | POA: Insufficient documentation

## 2017-06-03 DIAGNOSIS — Z9889 Other specified postprocedural states: Secondary | ICD-10-CM | POA: Insufficient documentation

## 2017-06-03 DIAGNOSIS — Z79890 Hormone replacement therapy (postmenopausal): Secondary | ICD-10-CM | POA: Insufficient documentation

## 2017-06-03 DIAGNOSIS — R51 Headache: Secondary | ICD-10-CM | POA: Diagnosis not present

## 2017-06-03 DIAGNOSIS — K219 Gastro-esophageal reflux disease without esophagitis: Secondary | ICD-10-CM | POA: Diagnosis not present

## 2017-06-03 DIAGNOSIS — Z888 Allergy status to other drugs, medicaments and biological substances status: Secondary | ICD-10-CM | POA: Diagnosis not present

## 2017-06-03 DIAGNOSIS — I10 Essential (primary) hypertension: Secondary | ICD-10-CM | POA: Insufficient documentation

## 2017-06-03 DIAGNOSIS — E785 Hyperlipidemia, unspecified: Secondary | ICD-10-CM | POA: Insufficient documentation

## 2017-06-03 DIAGNOSIS — Z8673 Personal history of transient ischemic attack (TIA), and cerebral infarction without residual deficits: Secondary | ICD-10-CM | POA: Insufficient documentation

## 2017-06-03 HISTORY — PX: ABDOMINAL AORTOGRAM W/LOWER EXTREMITY: CATH118223

## 2017-06-03 HISTORY — PX: PERIPHERAL VASCULAR ATHERECTOMY: CATH118256

## 2017-06-03 LAB — POCT I-STAT, CHEM 8
BUN: 7 mg/dL (ref 6–20)
CALCIUM ION: 0.94 mmol/L — AB (ref 1.15–1.40)
Chloride: 101 mmol/L (ref 101–111)
Creatinine, Ser: 1 mg/dL (ref 0.44–1.00)
GLUCOSE: 89 mg/dL (ref 65–99)
HCT: 38 % (ref 36.0–46.0)
HEMOGLOBIN: 12.9 g/dL (ref 12.0–15.0)
POTASSIUM: 3.2 mmol/L — AB (ref 3.5–5.1)
SODIUM: 144 mmol/L (ref 135–145)
TCO2: 28 mmol/L (ref 22–32)

## 2017-06-03 LAB — GLUCOSE, CAPILLARY: GLUCOSE-CAPILLARY: 86 mg/dL (ref 65–99)

## 2017-06-03 LAB — POCT ACTIVATED CLOTTING TIME: ACTIVATED CLOTTING TIME: 257 s

## 2017-06-03 SURGERY — ABDOMINAL AORTOGRAM W/LOWER EXTREMITY
Anesthesia: LOCAL

## 2017-06-03 MED ORDER — MIDAZOLAM HCL 2 MG/2ML IJ SOLN
INTRAMUSCULAR | Status: DC | PRN
  Administered 2017-06-03 (×2): 1 mg via INTRAVENOUS

## 2017-06-03 MED ORDER — VIPERSLIDE LUBRICANT OPTIME
TOPICAL | Status: DC | PRN
  Administered 2017-06-03: 11:00:00 via SURGICAL_CAVITY

## 2017-06-03 MED ORDER — MORPHINE SULFATE (PF) 10 MG/ML IV SOLN: 2.0000 mg | INTRAVENOUS | Status: DC | PRN

## 2017-06-03 MED ORDER — SODIUM CHLORIDE 0.9 % IV SOLN
INTRAVENOUS | Status: DC
  Administered 2017-06-03: 09:00:00 via INTRAVENOUS

## 2017-06-03 MED ORDER — MIDAZOLAM HCL 2 MG/2ML IJ SOLN
INTRAMUSCULAR | Status: AC
Start: 2017-06-03 — End: ?
  Filled 2017-06-03: qty 2

## 2017-06-03 MED ORDER — HEPARIN (PORCINE) IN NACL 1000-0.9 UT/500ML-% IV SOLN
INTRAVENOUS | Status: AC
  Filled 2017-06-03: qty 500

## 2017-06-03 MED ORDER — HEPARIN (PORCINE) IN NACL 2-0.9 UNITS/ML
INTRAMUSCULAR | Status: AC | PRN
  Administered 2017-06-03: 500 mL

## 2017-06-03 MED ORDER — LIDOCAINE HCL (PF) 1 % IJ SOLN
INTRAMUSCULAR | Status: AC
  Filled 2017-06-03: qty 30

## 2017-06-03 MED ORDER — HEPARIN SODIUM (PORCINE) 1000 UNIT/ML IJ SOLN
INTRAMUSCULAR | Status: DC | PRN
  Administered 2017-06-03: 10000 [IU] via INTRAVENOUS

## 2017-06-03 MED ORDER — LIDOCAINE HCL (PF) 1 % IJ SOLN
INTRAMUSCULAR | Status: DC | PRN
  Administered 2017-06-03: 15 mL

## 2017-06-03 MED ORDER — FENTANYL CITRATE (PF) 100 MCG/2ML IJ SOLN
INTRAMUSCULAR | Status: AC
  Filled 2017-06-03: qty 2

## 2017-06-03 MED ORDER — FENTANYL CITRATE (PF) 100 MCG/2ML IJ SOLN
INTRAMUSCULAR | Status: DC | PRN
  Administered 2017-06-03: 50 ug via INTRAVENOUS
  Administered 2017-06-03 (×2): 25 ug via INTRAVENOUS

## 2017-06-03 MED ORDER — HEPARIN SODIUM (PORCINE) 1000 UNIT/ML IJ SOLN
INTRAMUSCULAR | Status: AC
  Filled 2017-06-03: qty 1

## 2017-06-03 MED ORDER — IODIXANOL 320 MG/ML IV SOLN
INTRAVENOUS | Status: DC | PRN
  Administered 2017-06-03: 150 mL via INTRA_ARTERIAL

## 2017-06-03 SURGICAL SUPPLY — 25 items
BALLN STERLING OTW 5X100X135 (BALLOONS) ×3
BALLN STERLING OTW 5X220X150 (BALLOONS) ×3
BALLOON STERLING OTW 5X100X135 (BALLOONS) ×2 IMPLANT
BALLOON STERLING OTW 5X220X150 (BALLOONS) ×2 IMPLANT
BUR JETSTREAM XC 2.1/3.0 (BURR) ×2 IMPLANT
BURR JETSTREAM XC 2.1/3.0 (BURR) ×3
CATH OMNI FLUSH 5F 65CM (CATHETERS) ×3 IMPLANT
CATH QUICKCROSS SUPP .035X90CM (MICROCATHETER) ×3 IMPLANT
DEVICE CLOSURE MYNXGRIP 6/7F (Vascular Products) ×3 IMPLANT
DEVICE EMBOSHIELD NAV6 4.0-7.0 (FILTER) ×3 IMPLANT
KIT ENCORE 26 ADVANTAGE (KITS) ×3 IMPLANT
KIT MICROPUNCTURE NIT STIFF (SHEATH) ×3 IMPLANT
KIT PV (KITS) ×3 IMPLANT
SHEATH FLEXOR ANSEL 1 7F 45CM (SHEATH) ×3 IMPLANT
SHEATH PINNACLE 5F 10CM (SHEATH) ×3 IMPLANT
SHEATH PINNACLE 7F 10CM (SHEATH) ×3 IMPLANT
SHIELD RADPAD SCOOP 12X17 (MISCELLANEOUS) ×3 IMPLANT
STENT ELUVIA 6X120X130 (Permanent Stent) ×3 IMPLANT
SYR MEDRAD MARK V 150ML (SYRINGE) ×3 IMPLANT
TRANSDUCER W/STOPCOCK (MISCELLANEOUS) ×3 IMPLANT
TRAY PV CATH (CUSTOM PROCEDURE TRAY) ×3 IMPLANT
WIRE AQUATRAK .035X260 ANG (WIRE) ×3 IMPLANT
WIRE BAREWIRE WORK .014X315CM (WIRE) ×3 IMPLANT
WIRE BENTSON .035X145CM (WIRE) ×3 IMPLANT
WIRE G V18X300CM (WIRE) ×3 IMPLANT

## 2017-06-03 NOTE — Discharge Instructions (Signed)
**Note -identified via Obfuscation** Femoral Site Care °Refer to this sheet in the next few weeks. These instructions provide you with information about caring for yourself after your procedure. Your health care provider may also give you more specific instructions. Your treatment has been planned according to current medical practices, but problems sometimes occur. Call your health care provider if you have any problems or questions after your procedure. °What can I expect after the procedure? °After your procedure, it is typical to have the following: °· Bruising at the site that usually fades within 1-2 weeks. °· Blood collecting in the tissue (hematoma) that may be painful to the touch. It should usually decrease in size and tenderness within 1-2 weeks. ° °Follow these instructions at home: °· Take medicines only as directed by your health care provider. °· You may shower 24-48 hours after the procedure or as directed by your health care provider. Remove the bandage (dressing) and gently wash the site with plain soap and water. Pat the area dry with a clean towel. Do not rub the site, because this may cause bleeding. °· Do not take baths, swim, or use a hot tub until your health care provider approves. °· Check your insertion site every day for redness, swelling, or drainage. °· Do not apply powder or lotion to the site. °· Limit use of stairs to twice a day for the first 2-3 days or as directed by your health care provider. °· Do not squat for the first 2-3 days or as directed by your health care provider. °· Do not lift over 10 lb (4.5 kg) for 5 days after your procedure or as directed by your health care provider. °· Ask your health care provider when it is okay to: °? Return to work or school. °? Resume usual physical activities or sports. °? Resume sexual activity. °· Do not drive home if you are discharged the same day as the procedure. Have someone else drive you. °· You may drive 24 hours after the procedure unless otherwise instructed by  your health care provider. °· Do not operate machinery or power tools for 24 hours after the procedure or as directed by your health care provider. °· If your procedure was done as an outpatient procedure, which means that you went home the same day as your procedure, a responsible adult should be with you for the first 24 hours after you arrive home. °· Keep all follow-up visits as directed by your health care provider. This is important. °Contact a health care provider if: °· You have a fever. °· You have chills. °· You have increased bleeding from the site. Hold pressure on the site. °Get help right away if: °· You have unusual pain at the site. °· You have redness, warmth, or swelling at the site. °· You have drainage (other than a small amount of blood on the dressing) from the site. °· The site is bleeding, and the bleeding does not stop after 30 minutes of holding steady pressure on the site. °· Your leg or foot becomes pale, cool, tingly, or numb. °This information is not intended to replace advice given to you by your health care provider. Make sure you discuss any questions you have with your health care provider. °Document Released: 09/02/2013 Document Revised: 06/07/2015 Document Reviewed: 07/19/2013 °Elsevier Interactive Patient Education © 2018 Elsevier Inc[employer] ° °

## 2017-06-03 NOTE — Op Note (Signed)
Patient name: [name] MRN: [id number] DOB: 02/03/1950 Sex: female  06/03/2017 Pre-operative Diagnosis: Right lower extremity pain with peripheral arterial disease Post-operative diagnosis:  Same Surgeon:  Eda Paschal. Donzetta Matters, MD Procedure Performed: 1.  Ultrasound-guided cannulation left common femoral artery 2.  Aortogram with bilateral lower extremity runoff 3.  Jetstream atherectomy of right SFA with distal embolic protection 4.  Balloon angioplasty of right SFA with 5 mm balloon 5.  Drug-eluting stent placement with 6 x 120 mm balloon via in right SFA 6.  Moderate sedation with fentanyl and Versed for 85 minutes 7.  Minx grip device closure of left common femoral artery   Indications: 67 year old female with history of left lower extremity stenting.  She now has right lower extremity pain and ABI that is in the 0.5 range.  She is indicated for angina and possible intervention on the right.  Findings: Aorta is free of disease.  On the left there is an external iliac artery stent that appears patent as well as an SFA stent that appears patent.  The left sided SFA has multiple greater than 50% stenoses with one-vessel runoff via the peroneal.  The site of interest there is a 30% stenosis in the external iliac artery.  The SFA has multiple greater than 70% stenosis and is also occluded in several sections.  It reconstitutes an above-knee popliteal artery but then has runoff via the posterior tibial and peroneal arteries.  After initial balloon angioplasty and atherectomy there was a flow-limiting dissection proximally that was stented and there was residual 0% stenosis or dissection there.  Distally we had less than 30% stenosis where we previously had greater than 70%.  There is two-vessel runoff via the peroneal and posterior tibial arteries to the foot.   Procedure:  The patient was identified in the holding area and taken to room 8.  The patient was then placed supine on the table and  prepped and draped in the usual sterile fashion.  A time out was called.  Ultrasound was used to evaluate the left common femoral artery and this was noted to be pulsatile and patent.  This was cannulated under direct visualization with a micropuncture needle and an image was saved to the permanent record.  We then placed a micropuncture sheath and a Bentson wire.  An Omni Flush was placed to the level of L1 and aortogram followed by bilateral lower extremity runoff was performed with the above findings.  We then crossed the bifurcation with a Glidewire and Omni Flush catheter and placed a long 7 French sheath into the right common femoral artery.  A V 18 wire and quick cross catheter were used to cross the occluded SFA and this was done in a true luminal fashion.  We then confirmed distal access in the popliteal.  Jetstream atherectomy was then used.  We first exchanged for a bare wire and placed a nav 6 distal embolic device in the popliteal artery.  After arthrectomy we performed balloon angioplasty of the entire SFA with 5 mm balloon.  Approximately we did have a significant dissection and this was primarily stented with a drug-eluting stent.  The stent was postdilated with 5 mm balloon.  There was no flow-limiting stenosis or dissection and we still had flow via the posterior tibial and the peroneal to the level of the foot.  Satisfied we withdrew our sheath and perform an angled angiogram of our external iliac artery which demonstrated only approximately 30% stenosis and so this  was not treated.  We exchanged for short 7 French sheath and then deployed a minx device which did work well.  This patient tolerated procedure well without immediate complication.  Next  Contrast 150 cc.   [place] Francisco C. Donzetta Matters, MD [facility] [place] Office: [phone] Pager: [phone]

## 2017-06-03 NOTE — H&P (Signed)
   History and Physical Update  The patient was interviewed and re-examined.  The patient's previous History and Physical has been reviewed and is unchanged from recent office visit.  We will plan for right lower extremity angiogram from a left common femoral approach.  She demonstrates good understanding the risk and benefits.  Brandon C. Donzetta Matters, MD [facility] [place] Office: [phone] Pager: [phone]   06/03/2017, 9:22 AM

## 2017-06-04 ENCOUNTER — Telehealth: Payer: Self-pay | Admitting: Vascular Surgery

## 2017-06-04 ENCOUNTER — Encounter (HOSPITAL_COMMUNITY): Payer: Self-pay | Admitting: Vascular Surgery

## 2017-06-04 ENCOUNTER — Other Ambulatory Visit: Payer: Self-pay | Admitting: *Deleted

## 2017-06-04 ENCOUNTER — Other Ambulatory Visit: Payer: Self-pay | Admitting: Internal Medicine

## 2017-06-04 NOTE — Telephone Encounter (Signed)
error 

## 2017-06-04 NOTE — Telephone Encounter (Signed)
sch appt 07-10-17 1230pm LE Art 130pm ABI 2pm F/U PA

## 2017-06-04 NOTE — Telephone Encounter (Signed)
Error

## 2017-06-04 NOTE — Patient Outreach (Signed)
[facility]) Care Management  06/04/2017  [name] Feb 20, 1950 [id number]   Call placed to member to follow up on current health status.  Noted that member had aortogram yesterday, discharged same day.  She report she is doing well, cousin has been staying with her for support.  State she has had increased pain due to the procedure, advised to contact vascular MD for instructions regarding pain medications.  State her blood sugar has remained stable, denies any hyper/hypoglycemic episodes.  Denies any needs for this care manager, agrees to ongoing follow up from health coach.  Will place referral to health coach and notify MD of transition.  Valente David, [place], MSN [place] [phone]

## 2017-06-04 NOTE — Telephone Encounter (Signed)
-----   Message from Penni Homans, RN sent at 06/03/2017  2:28 PM EDT ----- Regarding: Appointment   ----- Message ----- From: Waynetta Sandy, MD Sent: 06/03/2017  11:37 AM To: [address]  ELNER SEIFERT [id number] Nov 05, 1950  06/03/2017 Pre-operative Diagnosis: Right lower extremity pain with peripheral arterial disease  Surgeon:  Erlene Quan C. Donzetta Matters, MD  Procedure Performed: 1.  Ultrasound-guided cannulation left common femoral artery 2.  Aortogram with bilateral lower extremity runoff 3.  Jetstream atherectomy of right SFA with distal embolic protection 4.  Balloon angioplasty of right SFA with 5 mm balloon 5.  Drug-eluting stent placement with 6 x 120 mm balloon via in right SFA 6.  Moderate sedation with fentanyl and Versed for 85 minutes 7.  Minx grip device closure of left common femoral artery  F/u in 4-6 week with RLE duplex and ABI

## 2017-06-10 ENCOUNTER — Other Ambulatory Visit: Payer: Self-pay | Admitting: *Deleted

## 2017-06-10 ENCOUNTER — Telehealth: Payer: Self-pay

## 2017-06-10 ENCOUNTER — Other Ambulatory Visit: Payer: Self-pay

## 2017-06-10 DIAGNOSIS — I10 Essential (primary) hypertension: Secondary | ICD-10-CM

## 2017-06-10 DIAGNOSIS — E669 Obesity, unspecified: Secondary | ICD-10-CM

## 2017-06-10 DIAGNOSIS — I739 Peripheral vascular disease, unspecified: Secondary | ICD-10-CM

## 2017-06-10 DIAGNOSIS — E039 Hypothyroidism, unspecified: Secondary | ICD-10-CM

## 2017-06-10 DIAGNOSIS — E118 Type 2 diabetes mellitus with unspecified complications: Secondary | ICD-10-CM

## 2017-06-10 DIAGNOSIS — K219 Gastro-esophageal reflux disease without esophagitis: Secondary | ICD-10-CM

## 2017-06-10 DIAGNOSIS — E1169 Type 2 diabetes mellitus with other specified complication: Secondary | ICD-10-CM

## 2017-06-10 DIAGNOSIS — M5432 Sciatica, left side: Principal | ICD-10-CM

## 2017-06-10 DIAGNOSIS — M7989 Other specified soft tissue disorders: Secondary | ICD-10-CM

## 2017-06-10 DIAGNOSIS — M5431 Sciatica, right side: Secondary | ICD-10-CM

## 2017-06-10 DIAGNOSIS — M79662 Pain in left lower leg: Secondary | ICD-10-CM

## 2017-06-10 NOTE — Telephone Encounter (Signed)
Requesting all meds to be filled @ [facility].

## 2017-06-10 NOTE — Telephone Encounter (Signed)
Request sent 

## 2017-06-11 ENCOUNTER — Telehealth: Payer: Self-pay | Admitting: *Deleted

## 2017-06-11 NOTE — Telephone Encounter (Signed)
Call to [facility] for PA for Doxepin. Spoke with representative from [employer].  No PA is needed at this time.  Sander Nephew, RN 06/11/2017 4:27 PM

## 2017-06-13 MED ORDER — VITAMIN D3 10 MCG (400 UNIT) PO CAPS: 400.0000 [IU] | ORAL_CAPSULE | Freq: Every day | ORAL | 1 refills | Status: DC

## 2017-06-13 MED ORDER — FUROSEMIDE 20 MG PO TABS: 20.0000 mg | ORAL_TABLET | Freq: Every day | ORAL | 1 refills | Status: DC

## 2017-06-13 MED ORDER — INSULIN GLARGINE 100 UNIT/ML ~~LOC~~ SOLN
20.0000 [IU] | Freq: Every day | SUBCUTANEOUS | 1 refills | Status: DC
Start: 2017-06-13 — End: 2017-10-23

## 2017-06-13 MED ORDER — ACETAMINOPHEN 500 MG PO TABS: 1000.0000 mg | ORAL_TABLET | Freq: Three times a day (TID) | ORAL | 1 refills | Status: DC | PRN

## 2017-06-13 MED ORDER — HYDROCHLOROTHIAZIDE 12.5 MG PO TABS: 25.0000 mg | ORAL_TABLET | Freq: Every day | ORAL | 1 refills | Status: DC

## 2017-06-13 MED ORDER — LEVOTHYROXINE SODIUM 112 MCG PO TABS: ORAL_TABLET | ORAL | 1 refills | Status: DC

## 2017-06-13 MED ORDER — GABAPENTIN 400 MG PO CAPS: ORAL_CAPSULE | ORAL | 1 refills | Status: DC

## 2017-06-13 MED ORDER — AMLODIPINE BESYLATE 10 MG PO TABS: 10.0000 mg | ORAL_TABLET | Freq: Every day | ORAL | 1 refills | Status: DC

## 2017-06-13 MED ORDER — PANTOPRAZOLE SODIUM 40 MG PO TBEC: 40.0000 mg | DELAYED_RELEASE_TABLET | Freq: Every day | ORAL | 1 refills | Status: DC

## 2017-06-13 MED ORDER — CLOPIDOGREL BISULFATE 75 MG PO TABS: 75.0000 mg | ORAL_TABLET | Freq: Every day | ORAL | 1 refills | Status: DC

## 2017-06-13 MED ORDER — METFORMIN HCL ER 500 MG PO TB24: 500.0000 mg | ORAL_TABLET | Freq: Two times a day (BID) | ORAL | 1 refills | Status: DC

## 2017-06-15 ENCOUNTER — Encounter: Payer: Self-pay | Admitting: *Deleted

## 2017-06-16 ENCOUNTER — Ambulatory Visit
Admission: RE | Admit: 2017-06-16 | Discharge: 2017-06-16 | Disposition: A | Discharge status: Home / Self Care | Payer: Medicare HMO | Source: Ambulatory Visit | Attending: Internal Medicine | Admitting: Internal Medicine

## 2017-06-16 ENCOUNTER — Telehealth: Payer: Self-pay | Admitting: *Deleted

## 2017-06-16 DIAGNOSIS — Z1231 Encounter for screening mammogram for malignant neoplasm of breast: Secondary | ICD-10-CM | POA: Diagnosis not present

## 2017-06-16 NOTE — Telephone Encounter (Signed)
Received fax from [facility] requesting refill on following items not listed on current med list:  BD single use swabs Accu-Chek softclix lancet Kit  Accu-Chek softclix lancets Accu-Chek Aviva solution (for quality control) L. Silvano Rusk, RN, BSN

## 2017-06-19 ENCOUNTER — Telehealth: Payer: Self-pay | Admitting: *Deleted

## 2017-06-19 NOTE — Telephone Encounter (Signed)
Received fax from [facility] stating patient is requesting refill on doxepin, however, this requires a PA. Alternative med, maprotiline 75 mg tablet does not require PA. Will route to PCP for consideration. Hubbard Hartshorn, RN, BSN

## 2017-06-19 NOTE — Telephone Encounter (Signed)
I have declined doxepin and started Ms. Balow a different anti-anxiolytic

## 2017-06-23 ENCOUNTER — Other Ambulatory Visit: Payer: Self-pay | Admitting: Internal Medicine

## 2017-06-23 MED ORDER — ACCU-CHEK AVIVA VI SOLN: 1.0000 "application " | Freq: Once | 1 refills | Status: AC

## 2017-06-23 MED ORDER — BD SWABS SINGLE USE BUTTERFLY PADS: MEDICATED_PAD | 1 refills | Status: DC

## 2017-06-23 MED ORDER — ACCU-CHEK SOFT TOUCH LANCETS MISC: 2 refills | Status: DC

## 2017-06-25 NOTE — Telephone Encounter (Signed)
The lancets have already been filled.

## 2017-07-02 ENCOUNTER — Other Ambulatory Visit: Payer: Self-pay | Admitting: *Deleted

## 2017-07-02 NOTE — Patient Outreach (Signed)
[facility]) Care Management  07/02/2017  ALETHEA TERHAAR 04/09/1950 [id number]   [place] Initial Assessment  Referral Date:  06/09/2017 Referral Source:  Transfer from [place]  Reason for Referral:  Continued Disease Management Education Insurance:  [facility] Medicare   Outreach Attempt:  Outreach attempt #1 to patient for initial telephone assessment. No answer. RN Health Coach left HIPAA compliant voicemail message along with contact information.  Plan:  RN Health Coach will send unsuccessful outreach letter to patient.  RN Health Coach will make another outreach attempt to patient within 3-4 business days if no return call back from patient.   [place] [phone] [email or web address]

## 2017-07-06 ENCOUNTER — Other Ambulatory Visit: Payer: Self-pay | Admitting: *Deleted

## 2017-07-06 NOTE — Patient Outreach (Signed)
[facility]) Care Management  07/06/2017  [name] April 26, 1950 [id number]   [place] Initial Assessment  Referral Date:  06/09/2017 Referral Source:  Transfer from [place]  Reason for Referral:  Continued Disease Management Education Insurance:  [facility] Medicare   Outreach Attempt:  Outreach attempt #2 to patient for initial telephone assessment. No answer. RN Health Coach left HIPAA compliant voicemail message along with contact information.  Plan:  RN Health Coach will make another outreach attempt to patient within 3-4 business days if no return call back from patient.  [place] [phone] [email or web address]

## 2017-07-07 ENCOUNTER — Encounter: Payer: Self-pay | Admitting: *Deleted

## 2017-07-07 ENCOUNTER — Other Ambulatory Visit: Payer: Self-pay | Admitting: *Deleted

## 2017-07-07 DIAGNOSIS — I739 Peripheral vascular disease, unspecified: Secondary | ICD-10-CM

## 2017-07-07 DIAGNOSIS — K219 Gastro-esophageal reflux disease without esophagitis: Secondary | ICD-10-CM

## 2017-07-07 MED ORDER — PANTOPRAZOLE SODIUM 40 MG PO TBEC
40.0000 mg | DELAYED_RELEASE_TABLET | Freq: Every day | ORAL | 1 refills | Status: DC
Start: 2017-07-07 — End: 2017-07-10

## 2017-07-07 MED ORDER — CLOPIDOGREL BISULFATE 75 MG PO TABS: 75.0000 mg | ORAL_TABLET | Freq: Every day | ORAL | 1 refills | Status: AC

## 2017-07-07 NOTE — Telephone Encounter (Signed)
Is the patient willing to try a different medication other than zoloft?

## 2017-07-07 NOTE — Telephone Encounter (Signed)
Patient called in requesting refills on plavix and protonix sent to [facility]. Also requesting return to doxepin. States she hasn't been able to sleep since being switched to zoloft. Knows insurance wont cover it but is willing to pay out of pocket. Hubbard Hartshorn, RN, BSN

## 2017-07-07 NOTE — Patient Outreach (Signed)
[facility]) Care Management  [place]  07/07/2017   SHERMA VANMETRE Aug 12, 1950 [id number]   [place] Initial Assessment   Referral Date:  06/09/2017 Referral Source:  Transfer from [place]  Reason for Referral:  Continued Disease Management Education Insurance:  [facility] Medicare   Outreach Attempt:  Successful telephone outreach to patient for initial telephone assessment.  HIPAA verified with patient.  Patient completed initial telephone assessment.  Social:  Patient states she lives at home alone.  Reports being independent with ADLs and IADLs.  Ambulates independently, but reports multiple falls at home.  States she has fallen at least 3 times in the past 3 months and has had more near falls where she has caught herself.  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  States she has asked her primary care for a prescription for cane and shower chair, as many falls have occurred in the bathroom.  Reports she can not stand for long periods of time, and has not discussed her many falls with her primary care providers.  Patient instructed and encouraged to discuss falls with providers.  Also encouraged patient to request walker, cane, and shower chair from providers.  Discussed with patient possible need for physical therapy for safety evaluation and assessment of equipment needs.  DME in the home include CBG meter and eyeglasses.  Patient reporting she is currently using [employer] and [employer] for transportation to her medical appointments.  She does state she is almost out of [employer] transport rides and her plan is to use [employer] or Public Transportation (if her legs feel well enough to allow for distant ambulation).  Conditions:  Per chart review and discussion with patient, PMH include and not limited to:  Anxiety, depression, diabetes, cataracts (left cataract removed/right needs to be  removed), chronic headaches, diverticulosis, GERD, glaucoma, hyperlipidemia, hypertension, hypothyroidism, peripheral artery disease, peptic ulcer disease, transient ischemic attack, and peripheral artery stenting.  Patient underwent recent right SFA angioplasty and stenting on 06/03/2017.  Reports her groin where she had her procedure continues to be sore and tender.  Encouraged patient to discuss her groin with her vascular physician at her appointment this week and make sure her assess her groin site.  Diagnosed with diabetes in February 2019.  Initial Hgb A1C was >14.  Last A1C was 8.1 05/15/2017.  Reports she monitors her blood sugars three times a day.  Fasting blood sugar this morning was 96 with ranges of 100-110's.  Continues to smoke.  Smoking cessation discussed and encouraged.  Patient reports increasing feelings of depression over the past few months.  Stated she has previously attended counseling and stopped, but is interested in getting back into therapy.  States her increase in depression is related to this is the month of the death of her husband.  Discussed [place] Work referral and patient verbally agrees.  Medications:  Patient reports taking about 15 medications.  States she receives Extra Help to assist with affordability of the medications.  Manages her medications herself using a pill box she fills weekly.  Patient reporting her depression and sleep medications have been changed and she has not been getting sleep which is also increasing her depression.  States she does not take her Zoloft because she does not like the way it make her feel.  Reports she only takes the Zoloft when she feels anxious.  Patient stating her statin  medication is causing her to itch so she takes this medication every other day.  Encouraged patient to discuss this with her primary care physician.  Discussed [place] consult to assist with medication reconciliation, patient verbally agrees.   Encounter  Medications:  Outpatient Encounter Medications as of 07/07/2017  Medication Sig Note  . acetaminophen (TYLENOL) 500 MG tablet Take 2 tablets (1,000 mg total) by mouth every 8 (eight) hours as needed for moderate pain.   . Alcohol Swabs (B-D SINGLE USE SWABS BUTTERFLY) PADS Use as needed   . amLODipine (NORVASC) 10 MG tablet Take 1 tablet (10 mg total) by mouth daily.   . celecoxib (CELEBREX) 100 MG capsule Take 100 mg by mouth daily.   . cilostazol (PLETAL) 100 MG tablet Take 100 mg by mouth 2 (two) times daily.   . furosemide (LASIX) 20 MG tablet Take 1 tablet (20 mg total) by mouth daily.   Marland Kitchen gabapentin (NEURONTIN) 400 MG capsule TAKE 1 CAPSULE BY MOUTH EVERY MORNING, 1 CAPSULE AT LUNCH AND 2 CAPSULES AT BEDTIME   . glucose blood (ACCU-CHEK AVIVA) test strip Use 3 times daily to check blood sugar. 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 Box of 100 equals 1 month testing   . hydrochlorothiazide (HYDRODIURIL) 12.5 MG tablet Take 2 tablets (25 mg total) by mouth daily. 07/07/2017: Taking 1 tablet twice a day  . insulin glargine (LANTUS) 100 UNIT/ML injection Inject 0.2 mLs (20 Units total) into the skin at bedtime.   . Insulin Syringe-Needle U-100 31G X 15/64" 0.3 ML MISC Use to inject insulin daily   . Lancets (ACCU-CHEK SOFT TOUCH) lancets Use as instructed   . levothyroxine (SYNTHROID, LEVOTHROID) 112 MCG tablet TAKE 1 TABLET BY MOUTH ONCE DAILY BEFORE BREAKFAST   . metFORMIN (GLUCOPHAGE-XR) 500 MG 24 hr tablet Take 1 tablet (500 mg total) by mouth 2 (two) times daily before lunch and supper.   Marland Kitchen OVER THE COUNTER MEDICATION Take 500 mg by mouth daily. Potassium 500mg    . Polyethyl Glycol-Propyl Glycol (SYSTANE OP) Place 1 drop into both eyes daily.   . [DISCONTINUED] clopidogrel (PLAVIX) 75 MG tablet Take 1 tablet (75 mg total) by mouth daily. For Peripheral Arterial disease   . [DISCONTINUED] pantoprazole (PROTONIX) 40 MG tablet Take 1 tablet (40 mg total) by mouth daily. 07/07/2017: Taking twice a  day  . atorvastatin (LIPITOR) 40 MG tablet Take 1 tablet (40 mg total) by mouth daily. 07/07/2017: Reports taking but states medication make her itch so she takes every other day  . Cholecalciferol (VITAMIN D3) 400 units CAPS Take 400 Units by mouth daily. (Patient not taking: Reported on 07/07/2017) 07/07/2017: Not currently taking  . sertraline (ZOLOFT) 25 MG tablet Take 1 tablet (25 mg total) by mouth daily. (Patient not taking: Reported on 05/27/2017) 07/07/2017: Patient not taking, only takes PRN when she is feeling anxious   No facility-administered encounter medications on file as of 07/07/2017.     Functional Status:  In your present state of health, do you have any difficulty performing the following activities: 07/07/2017 05/15/2017  Hearing? N Y  [place]? N N  Difficulty concentrating or making decisions? Y N  Comment forgetful at times, TIA's causing short term memory loss -  Walking or climbing stairs? Y Y  Comment multiple falls, can not stand for long periods of time -  Dressing or bathing? Y Y  Comment - -  Doing errands, shopping? Tem[place] Donning  Comment has transportation issues at time -  Preparing Food and eating ? N -  Using the Toilet? N -  In the past six months, have you accidently leaked urine? Y -  Do you have problems with loss of bowel control? N -  Managing your Medications? N -  Managing your Finances? N -  Housekeeping or managing your Housekeeping? N -  Some recent data might be hidden    Fall/Depression Screening: Fall Risk  07/07/2017 05/15/2017 04/27/2017  Falls in the past year? Yes Yes Yes  Comment - - -  Number falls in past yr: 2 or more 2 or more 2 or more  Comment - - -  Injury with Fall? No No No  Comment - - -  Risk Factor Category  - High Fall Risk High Fall Risk  Risk for fall due to : History of fall(s);Impaired balance/gait;Impaired mobility;Medication side effect History of fall(s);Impaired balance/gait;Impaired mobility History of  fall(s);Impaired balance/gait;Impaired mobility  Risk for fall due to: Comment - - Pain in legs  Follow up Falls evaluation completed;Falls prevention discussed;Education provided - -   PHQ 2/9 Scores 07/07/2017 05/15/2017 04/27/2017 03/27/2017 03/12/2017 03/10/2017 01/21/2017  PHQ - 2 Score 3 1 1 1 1 1 2   PHQ- 9 Score 8 7 7  - - - 13    THN CM Care Plan Problem One     Most Recent Value  Care Plan Problem One  Knowledge deficit related to self care management of diagnosis  Role Documenting the Problem One  [place] for Problem One  Active  THN Long Term Goal   Patient will maintain A1C of 8.1 in the next 90 days.  THN Long Term Goal Start Date  07/07/17  Interventions for Problem One Long Term Goal  Reviewed and discussed care plan and goals with patient, congratulated patient on reduction of her Hgb A1C, reviewed and discussed Hgb A1C meaning with patient, encouraged patient to continue to revew Living Well with Diabetes Booklet, encouraged patient to discuss goal A1C with physician, encouraged patient to keep and attend medical appointments, [facility] Social Worker consult to assist with depression resources, EMMI on Smoking Cessation  THN CM Short Term Goal #1   Patient will discuss falls with her primary care providers at her next medical appointment on July 5.  THN CM Short Term Goal #1 Start Date  07/07/17  Interventions for Short Term Goal #1  Falls precautions reviewed and discussed with patient, patient encouraged to discuss increase in falls with physicians and to request walker/cane/shower chair from physicians, discussed possible need of physical therapy with patient, EMMI on Falls Precautions, EMMI on Getting Up from a Fall  [facility] CM Short Term Goal #2   Patient will discuss her medications with her primary care physician at her next medical appointment on July 5. (zoloft and lipitor)  THN CM Short Term Goal #2 Start Date  07/07/17  Interventions for Short Term Goal #2  Reviewed  medications with patient and discussed indications, encouraged patient to discuss side effects she is experiencing from medications with physician, [place] consult for medication reconcilliation,       Appointments:  Patient attended primary care appointment on 05/15/2017 with [name] and has scheduled follow up appointment on 07/17/2017.  Patient has follow up appointment with Dr. Donzetta Matters, Vascular on 07/10/2017.  Advanced Directives:  Patient reports having a Living Will and [place] in place and does not wish to make any changes at this time.   Consent:  [facility] services reviewed and discussed.  Patient verbally agrees to [place] monthly telephone outreaches, [place] Worker referral for depression and counseling resources, and [place] for medication education and reconciliation.  Plan: RN Health Coach will send primary MD barriers letter. RN Health Coach will route initial telephone assessment note to primary MD. [place] will send patient EMMI Preventing Falls. RN Health Coach will send patient EMMI Getting Up from a Fall. RN Health Coach will send patient EMMI Quitting Smoking. RN Health Coach will send [facility] SW referral for possible assistance with community resources related to depression and counseling assistance. RN Health Coach will send [place] referral for medication education and reconciliation. RN Health Coach will make next monthly outreach to patient in the month of July.  [place] [phone] [email or web address]

## 2017-07-08 ENCOUNTER — Encounter: Payer: Self-pay | Admitting: Internal Medicine

## 2017-07-08 NOTE — Telephone Encounter (Signed)
I messaged Dr. Dareen Piano to discuss about switching back to Doxepin. Will let you know [name].

## 2017-07-08 NOTE — Progress Notes (Signed)
The patient called clinic to discuss possibility of being switched back to doxepin. Offered the patient a different medication other than sertraline, however she continues to refuse. The patient has been having several falls as noted by [facility] which she mentioned that she did not inform us about during clinic visit. Requested front desk to schedule clinic visit to discuss about doxepin change and to assess about recent falls.

## 2017-07-09 NOTE — Telephone Encounter (Signed)
Patient called in upset that Rx for protonix was sent for once daily when she's been taking bid x 3 years. Would like 90 day supply of protonix 40 mg bid sent to [facility].  Also upset that Rx for doxepin was not sent in. States falls are 2/2 "legs give out" and not doxepin. Patient has appt on 07/17/2017 with PCP. States she will be very agitated when she comes to that appt due to problems obtaining her meds. Hubbard Hartshorn, RN, BSN

## 2017-07-10 ENCOUNTER — Ambulatory Visit (INDEPENDENT_AMBULATORY_CARE_PROVIDER_SITE_OTHER): Payer: Medicare HMO | Admitting: Physician Assistant

## 2017-07-10 ENCOUNTER — Other Ambulatory Visit: Payer: Self-pay | Admitting: Internal Medicine

## 2017-07-10 ENCOUNTER — Ambulatory Visit (INDEPENDENT_AMBULATORY_CARE_PROVIDER_SITE_OTHER)
Admission: RE | Admit: 2017-07-10 | Discharge: 2017-07-10 | Disposition: A | Discharge status: Home / Self Care | Payer: Medicare HMO | Source: Ambulatory Visit | Attending: Vascular Surgery | Admitting: Vascular Surgery

## 2017-07-10 ENCOUNTER — Ambulatory Visit (HOSPITAL_COMMUNITY)
Admission: RE | Admit: 2017-07-10 | Discharge: 2017-07-10 | Disposition: A | Discharge status: Home / Self Care | Payer: Medicare HMO | Source: Ambulatory Visit | Attending: Vascular Surgery | Admitting: Vascular Surgery

## 2017-07-10 VITALS — BP 145/97 | HR 78 | Temp 97.1°F | Resp 16 | Ht 60.5 in | Wt 207.6 lb

## 2017-07-10 DIAGNOSIS — I779 Disorder of arteries and arterioles, unspecified: Secondary | ICD-10-CM

## 2017-07-10 DIAGNOSIS — M79604 Pain in right leg: Secondary | ICD-10-CM | POA: Insufficient documentation

## 2017-07-10 DIAGNOSIS — I7789 Other specified disorders of arteries and arterioles: Secondary | ICD-10-CM | POA: Insufficient documentation

## 2017-07-10 DIAGNOSIS — M79605 Pain in left leg: Secondary | ICD-10-CM | POA: Diagnosis not present

## 2017-07-10 DIAGNOSIS — K219 Gastro-esophageal reflux disease without esophagitis: Secondary | ICD-10-CM

## 2017-07-10 DIAGNOSIS — Z95828 Presence of other vascular implants and grafts: Secondary | ICD-10-CM | POA: Insufficient documentation

## 2017-07-10 DIAGNOSIS — F17200 Nicotine dependence, unspecified, uncomplicated: Secondary | ICD-10-CM | POA: Diagnosis not present

## 2017-07-10 DIAGNOSIS — I739 Peripheral vascular disease, unspecified: Secondary | ICD-10-CM

## 2017-07-10 MED ORDER — PANTOPRAZOLE SODIUM 40 MG PO TBEC: 40.0000 mg | DELAYED_RELEASE_TABLET | Freq: Two times a day (BID) | ORAL | 0 refills | Status: DC

## 2017-07-10 NOTE — Progress Notes (Addendum)
HISTORY AND PHYSICAL     CC:  Follow up Requesting Provider:  Lars Mage, MD  HPI: This is a 67 y.o. female who has a hx of LLE stenting to the SFA in 2013 at [facility] and more recently Dr. Tacy Learn performed stenting of her left EIA with 2 stents 2 years ago.  She presented back to Dr. Donzetta Matters with persistent  RLE pain occurred with sitting in certain positions and exacerbated wit walking or with any pressure. and ABI around 0.5.  She does not take aspirin but she does take Plavix.  She has numbness that is progressive in both feet and recently diagnosed as diabetic and on insulin.  She is on gabapentin for neuropathy.   She has a hx of renal insufficiency but her creatinine on the day of procedure was 1.0.  The pt is on a statin for cholesterol management.  She is on a CCB for blood pressure management.   She states that she has had trouble with acid reflux since she was 14.  Many years ago, she developed an ulcer and required open abdominal surgery for this.   On 06/03/17, she was taken to the [facility] lab and underwent jet-stream atherectomy of the right SFA withdistal embolic protection,  balloon angioplasty of the right SFA, drug eluding stent placement of right SFA.  ABI's prior to procedure were 0.59 on the right and 0.69 on the left.   She states that her right leg is much better but she still has pain in both thighs but the right is worse than the left.  She also c/o swelling in both legs but the right is worse.  She states that this is improved in the mornings when she wakes and the more she is on her feet, the more they swell.  She states that about 3 weeks ago, she injured the 5th toe on the right foot and it is slowly getting better but not healed.  She continues to smoke but is trying to quit.  She states that she has also lost ~ 15lbs now that she is not having as much pain in her legs and able to get around better.    She tells me that her mother died during cardiac surgery and her father  died from an aneurysm rupture in his abdomen.  She is the only sibling of 8 left.  Most died with cancer, one with AIDS, one with brain aneurysm and one with MI.    Past Medical History:  Diagnosis Date  . Allergy   . Anal fissure   . Anxiety   . Cataract   . Chronic headaches   . Depression   . Diverticulosis   . GERD (gastroesophageal reflux disease)   . Glaucoma   . HLD (hyperlipidemia)   . Hypertension   . Hypothyroidism   . PAD (peripheral artery disease) ([place])   . PUD (peptic ulcer disease)   . TIA (transient ischemic attack)     Past Surgical History:  Procedure Laterality Date  . ABDOMINAL AORTAGRAM N/A 01/20/2013   Procedure: ABDOMINAL Maxcine Ham;  Surgeon: Conrad Bell Center, MD;  Location: [facility] CATH LAB;  Service: Cardiovascular;  Laterality: N/A;  . ABDOMINAL AORTOGRAM W/LOWER EXTREMITY N/A 06/03/2017   Procedure: ABDOMINAL AORTOGRAM W/LOWER EXTREMITY;  Surgeon: Waynetta Sandy, MD;  Location: [place] [facility];  Service: Cardiovascular;  Laterality: N/A;  Bilateral  . ANAL FISSURE REPAIR    . AORTOGRAM    . BREAST REDUCTION SURGERY    .  CATARACT EXTRACTION Left   . iliac artery angioplasty and stenting Left 12/2012  . PERIPHERAL VASCULAR ATHERECTOMY  06/03/2017   Procedure: PERIPHERAL VASCULAR ATHERECTOMY and Stent;  Surgeon: Waynetta Sandy, MD;  Location: [place] [facility];  Service: Cardiovascular;;  Rt. SFA   . REDUCTION MAMMAPLASTY Bilateral   . SFA stent Left 2012   at [facility]  . STOMACH SURGERY    . TOTAL THYROIDECTOMY      Allergies  Allergen Reactions  . Gadolinium Derivatives Anaphylaxis and Other (See Comments)    Chest tightness  . Asa [Aspirin] Hives and Nausea And Vomiting  . Crestor [Rosuvastatin Calcium] Hives and Itching  . Penicillins Hives and Itching    Has patient had a PCN reaction causing immediate rash, facial/tongue/throat swelling, SOB or lightheadedness with hypotension: Yes Has patient had a PCN  reaction causing severe rash involving mucus membranes or skin necrosis: Yes Has patient had a PCN reaction that required hospitalization No Has patient had a PCN reaction occurring within the last 10 years: No If all of the above answers are "NO", then may proceed with Cephalosporin use.     Current Outpatient Medications  Medication Sig Dispense Refill  . acetaminophen (TYLENOL) 500 MG tablet Take 2 tablets (1,000 mg total) by mouth every 8 (eight) hours as needed for moderate pain. 90 tablet 1  . Alcohol Swabs (B-D SINGLE USE SWABS BUTTERFLY) PADS Use as needed 100 each 1  . amLODipine (NORVASC) 10 MG tablet Take 1 tablet (10 mg total) by mouth daily. 90 tablet 1  . atorvastatin (LIPITOR) 40 MG tablet Take 1 tablet (40 mg total) by mouth daily. 30 tablet 2  . celecoxib (CELEBREX) 100 MG capsule Take 100 mg by mouth daily.    . Cholecalciferol (VITAMIN D3) 400 units CAPS Take 400 Units by mouth daily. (Patient not taking: Reported on 07/07/2017) 90 capsule 1  . cilostazol (PLETAL) 100 MG tablet Take 100 mg by mouth 2 (two) times daily.    . clopidogrel (PLAVIX) 75 MG tablet Take 1 tablet (75 mg total) by mouth daily. For Peripheral Arterial disease 90 tablet 1  . furosemide (LASIX) 20 MG tablet Take 1 tablet (20 mg total) by mouth daily. 90 tablet 1  . gabapentin (NEURONTIN) 400 MG capsule TAKE 1 CAPSULE BY MOUTH EVERY MORNING, 1 CAPSULE AT LUNCH AND 2 CAPSULES AT BEDTIME 360 capsule 1  . glucose blood (ACCU-CHEK AVIVA) test strip Use 3 times daily to check blood sugar. 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 Box of 100 equals 1 month testing 300 each 1  . hydrochlorothiazide (HYDRODIURIL) 12.5 MG tablet Take 2 tablets (25 mg total) by mouth daily. 180 tablet 1  . insulin glargine (LANTUS) 100 UNIT/ML injection Inject 0.2 mLs (20 Units total) into the skin at bedtime. 3 vial 1  . Insulin Syringe-Needle U-100 31G X 15/64" 0.3 ML MISC Use to inject insulin daily 100 each 5  . Lancets (ACCU-CHEK  SOFT TOUCH) lancets Use as instructed 100 each 2  . levothyroxine (SYNTHROID, LEVOTHROID) 112 MCG tablet TAKE 1 TABLET BY MOUTH ONCE DAILY BEFORE BREAKFAST 90 tablet 1  . metFORMIN (GLUCOPHAGE-XR) 500 MG 24 hr tablet Take 1 tablet (500 mg total) by mouth 2 (two) times daily before lunch and supper. 180 tablet 1  . OVER THE COUNTER MEDICATION Take 500 mg by mouth daily. Potassium 500mg     . pantoprazole (PROTONIX) 40 MG tablet Take 1 tablet (40 mg total) by mouth 2 (two)  times daily. 180 tablet 0  . Polyethyl Glycol-Propyl Glycol (SYSTANE OP) Place 1 drop into both eyes daily.    . sertraline (ZOLOFT) 25 MG tablet Take 1 tablet (25 mg total) by mouth daily. (Patient not taking: Reported on 05/27/2017) 30 tablet 1   No current facility-administered medications for this visit.     Family History  Problem Relation Age of Onset  . Peripheral vascular disease Mother   . Heart disease Mother   . Hypertension Mother   . AAA (abdominal aortic aneurysm) Father   . Hypertension Father   . Aneurysm Father   . Uterine cancer Sister        mets to brain  . Liver cancer Brother   . Stomach cancer Maternal Aunt        x2  . Stomach cancer Paternal Aunt   . Heart disease Brother   . Kidney cancer Brother   . Anuerysm Brother        brain  . HIV Brother        x2    Social History   Socioeconomic History  . Marital status: Widowed    Spouse name: Not on file  . Number of children: 1  . Years of education: 4  . Highest education level: Not on file  Occupational History  . Occupation: Disabled   Social Needs  . Financial resource strain: Not on file  . Food insecurity:    Worry: Not on file    Inability: Not on file  . Transportation needs:    Medical: Not on file    Non-medical: Not on file  Tobacco Use  . Smoking status: Current Some Day Smoker    Packs/day: 0.50    Years: 0.50    Pack years: 0.25    Types: Cigarettes  . Smokeless tobacco: Never Used  . Tobacco comment: 1 pack  per 2 days  Substance and Sexual Activity  . Alcohol use: No    Alcohol/week: 0.0 oz  . Drug use: Yes    Types: Cocaine, Marijuana    Comment: marijuana: 2-3 times per month  . Sexual activity: Never    Birth control/protection: None  Lifestyle  . Physical activity:    Days per week: Not on file    Minutes per session: Not on file  . Stress: Not on file  Relationships  . Social connections:    Talks on phone: Not on file    Gets together: Not on file    Attends religious service: Not on file    Active member of club or organization: Not on file    Attends meetings of clubs or organizations: Not on file    Relationship status: Not on file  . Intimate partner violence:    Fear of current or ex partner: Not on file    Emotionally abused: Not on file    Physically abused: Not on file    Forced sexual activity: Not on file  Other Topics Concern  . Not on file  Social History Narrative   Live at home alone.   Caffeine use: Drinks coffee once/day   Soda- 2-3 times per day     REVIEW OF SYSTEMS:   [X]  denotes positive finding, [ ]  denotes negative finding Cardiac  Comments:  Chest pain or chest pressure:    Shortness of breath upon exertion:    Short of breath when lying flat:    Irregular heart rhythm:        Vascular  Pain in calf, thigh, or hip brought on by ambulation: x   Pain in feet at night that wakes you up from your sleep:  x   Blood clot in your veins:    Leg swelling:  x       Pulmonary    Oxygen at home:    Productive cough:     Wheezing:         Neurologic    Sudden weakness in arms or legs:     Sudden numbness in arms or legs:  x   Sudden onset of difficulty speaking or slurred speech:    Temporary loss of vision in one eye:     Problems with dizziness:         Gastrointestinal    Blood in stool:     Vomited blood:         Genitourinary    Burning when urinating:     Blood in urine:        Psychiatric    Major depression:  x         Hematologic    Bleeding problems:    Problems with blood clotting too easily:        Skin    Rashes or ulcers:        Constitutional    Fever or chills:      PHYSICAL EXAMINATION: Vitals:   07/10/17 1323 07/10/17 1328  BP: (!) 155/98 (!) 145/97  Pulse: 78   Resp: 16   Temp: (!) 97.1 F (36.2 C)   SpO2: 99%    Vitals:   07/10/17 1323  Weight: 207 lb 9.6 oz (94.2 kg)  Height: 5' 0.5" (1.537 m)   Body mass index is 39.88 kg/m.   General:  WDWN in NAD; vital signs documented above Gait: Not observed HENT: WNL, normocephalic Pulmonary: normal non-labored breathing , without Rales, rhonchi,  wheezing Cardiac: regular HR, without  Murmurs without carotid bruits Abdomen: soft, slightly tender to mild palpation, no masses Skin: without rashes; left groin is soft without hematoma Vascular Exam/Pulses:  Right Left  Radial 2+ (normal) Unable to palpate  Ulnar Unable to palpate Unable to palpate   DP monophasic monophasic  PT Brisk monophasic monophasic  Peroneal monophasic monophasic   Extremities: small superficial wound on dorsum of right 5th toe; there is hemosiderin staining bilateral legs left>right.  BLE with right >left. Bilateral feet are warm.   Musculoskeletal: no muscle wasting or atrophy  Neurologic: A&O X 3;  No focal weakness or paresthesias are detected Psychiatric:  The pt has Normal affect.   Non-Invasive Vascular Imaging:   ABI's 07/10/17: Right:  0.77 Left:  0.84  TBI's 07/10/17: Right:  0.62 Left:  0.63  Lower extremity arterial duplex - right: Patent stent with no evidence of stenosis in the proximal SFA artery  Previous ABI 04/27/17: Right:  0.59 Left:  0.69  Pt meds includes: Statin:  Yes.   Beta Blocker:  No. Aspirin:  No. ACEI:  No. ARB:  No. CCB use:  Yes Other Antiplatelet/Anticoagulant:  Yes Plavix   ASSESSMENT/PLAN:: 67 y.o. female who is s/p: 1.  Ultrasound-guided cannulation left common femoral artery 2.  Aortogram with  bilateral lower extremity runoff 3.  Jetstream atherectomy of right SFA with distal embolic protection 4.  Balloon angioplasty of right SFA with 5 mm balloon 5.  Drug-eluting stent placement with 6 x 120 mm balloon via in right SFA 6.  Moderate sedation with fentanyl and Versed for 85  minutes 7.  Minx grip device closure of left common femoral artery  Here for follow up.   -pt's ABI's are improved from pre procedure ABI's.  Her symptoms have greatly improved but still having some bilateral thigh pain that sounds more related spine issues.  She does have a small superficial wound on her right 5th toe, but this is improving.   -I spent greater than 3 minutes discussing smoking cessation with the pt and the importance.  She continues to try to quit.  -after discussion about family hx, she reveals that her father died from an aneurysm rupture in the abdomen.  Given this history, will plan for aorta duplex when she returns in 6 months. -bilateral leg swelling:  Most likely related to venous insufficiency.  She has hemosiderin staining BLE and her swelling is improved in the mornings after sleeping.  I encouraged her elevate her legs when she is not up and around.  I did discuss the importance of walking at least 30 minutes a day as well as weight loss to help with her swelling.  If this becomes worse, we can get venous duplex -diabetes/neuropathy:  She does describe some sharp shooting pains that come and go in her feet and lower legs and I discussed that this is most likely related to her neuropathy.  Continue Neurontin per PCP.   -pt will work on smoking cessation, exercise, diet and weight loss. -she will return in 6 months with BLE arterial duplex, ABI's and screening aorta duplex given her family hx with her father. -she will call sooner should she have any issues.    Leontine Locket, PA-C [facility] [phone]  Clinic MD:  Scot Dock

## 2017-07-10 NOTE — Progress Notes (Signed)
For the past few months it looks like pantoprazole was recently changed to once daily by another provider. Will resend refill for twice daily.   Doxepin has numerous side effects and I worry about the patient being on this medication especially given her recent falls that [facility] has documented. I discussed this with Dr. Dareen Piano and he agreed that she needs to be seen in clinic. Please inform her that we apologize for having to come in, but it is so we can assess her in person and talk about a safe sustainable solution.

## 2017-07-10 NOTE — Telephone Encounter (Signed)
Looks like pantoprazole was recently changed to once daily by another provider. Will resend refill for twice daily.   Doxepin has numerous side effects and I worry about the patient being on this medication especially given her recent falls that [facility] has documented. I discussed this with Dr. Dareen Piano and he agreed that she needs to be seen in clinic. Please inform her that we apologize for having to come in, but it is so we can assess her in person and talk about a safe sustainable solution.

## 2017-07-13 NOTE — Progress Notes (Signed)
Appt has been sch on 07/17/2017 to f/u.

## 2017-07-14 NOTE — Progress Notes (Signed)
Pt is sch on 07/17/2017 with you.

## 2017-07-16 NOTE — Progress Notes (Signed)
CC: Major Depressive Disorder Medication  HPI:  [name] is a 67 y.o. female with diabetes mellitus type 2, hypertension, peripheral artery disease, hypothyroidism, and major depressive disorder who presents to discuss about medication for major depressive disorder. Please see problem based charting for evaluation, assessment, and plan.  Past Medical History:  Diagnosis Date  . Allergy   . Anal fissure   . Anxiety   . Cataract   . Chronic headaches   . Depression   . Diverticulosis   . GERD (gastroesophageal reflux disease)   . Glaucoma   . HLD (hyperlipidemia)   . Hypertension   . Hypothyroidism   . PAD (peripheral artery disease) ([place])   . PUD (peptic ulcer disease)   . TIA (transient ischemic attack)    Review of Systems:   Nausea, epigastric abdominal pain, blood in toilet, itching Denies vomiting  Physical Exam:  Vitals:   07/17/17 1335  BP: (!) 149/84  Pulse: 96  Temp: 98.9 F (37.2 C)  TempSrc: Oral  SpO2: 100%  Weight: 205 lb 9.6 oz (93.3 kg)  Height: 5' 0.5" (1.537 m)   Physical Exam  Constitutional: She appears well-developed and well-nourished. No distress.  HENT:  Head: Normocephalic and atraumatic.  Eyes: Conjunctivae are normal.  Cardiovascular: Normal rate, regular rhythm and normal heart sounds.  Respiratory: Effort normal. No respiratory distress. She has wheezes (Right lower lobe and right upper lobe). She has no rales.  GI: Soft. Bowel sounds are normal. She exhibits no distension. There is tenderness (Epigastric).  Musculoskeletal: She exhibits edema (Right lower extremity).  Circumference 10 cm below knee: 44 cm, 41 cm.  Circumference around ankle: 24 cm on the right, 22 cm on the left  Neurological: She is alert.  Skin: She is not diaphoretic. No erythema.  Psychiatric: Her speech is normal and behavior is normal. Thought content normal. Her mood appears anxious. Cognition and memory are normal. She exhibits a depressed mood.      Assessment & Plan:   See Encounters Tab for problem based charting.  Hypertension  The patient's blood pressure during this visit 149/84. The patient is currently on amlodipine 10mg  qd, hydrochlorothiazide 25mg  daily, Lasix 20 mg daily.  Assessment and plan The patient's blood pressure is not adequately controlled.  She does not need Lasix at this time so we will discontinue. Per BMP done in February 2019 which shows the patient's renal function is normal she creatinine=1 GFR=67.  The patient will need additional hypertension medication and therefore started patient on lisinopril 10 mg daily.  Major Depressive Disorder with anxiety The patient was started on sertraline 25 mg once daily at previous visit in May 2019. The patient called clinic and expressed that she would like to be switched to doxepin which she was previously on.  TH and has been monitoring the patient and informed us that she has been falling a lot lately.  Due to concern of the patient's recent falls requested that the patient return to clinic to evaluate her falls and determine appropriate medication for her anxiety and depression.   Assessment and plan  The patient states that she does not believe that her symptoms are well controlled on sertraline.  She failed therapy with Zoloft and Celexa in the past.  During today's visit the patient's GAD score was 9 and PHQ 9 8.  Since the patient states that she was doing well on doxepin previously will restart it.  -Start doxepin 150 mg daily  Frequent falls  Patient states that she has been having numerous falls recently.  [facility] has also documented these falls as well.  States that she has not hit her head and usually last on her bilateral knees or arms.  She has not had any significant pain or injuries subsequent to the falls.   Patient notes that each time she falls she has had different precipitating events.  She notes dizziness sometimes and states that other times she feels  as if her legs are going to give out.  The patient's home environment is clear of any environmental hair hazards.  She is wearing good footwear.  She is not on any significant centrally acting medications.  Orthostatic vital signs were negative.  The patient noted chest tightness 1-2 times daily.  EKG was done which showed normal sinus rhythm, left axis deviation, chronic anterior septal infarct changes.  The EKG was not significantly different from prior EKG in January 2019.  Assessment and plan Patient states that she is dizziness and some ringing in the ears occasionally and therefore she may have some vestibular dysfunction that may be causing her frequent falls.  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  Patient is currently taking a total of 1600 mg of gabapentin which she states has significantly decreased her numbness and tingling.  However, the patient states that she still sometimes feels numbness.  Therefore, will increase gabapentin from 1600 mg to 2000 mg daily by increasing her morning dose from 1 capsule to 2 capsules.  -Increase gabapentin from 1600 mg to 2000 mg daily   Peripheral artery disease Patient has left SFA stent in 2013 at [facility], 2 stents in left external iliac artery in 2017.    The patient continued to have right lower extremity pain.  Dr. Gwenlyn Saran did an ultrasound-guided cannulation of the left femoral artery, aortogram with bilateral lower extremity runoff, arthrectomy of the right SFA with distal embolic protection, balloon angioplasty of the right SFA with a 5 mm balloon, and drug-eluting stent placement with a balloon in the right SFA on 06/03/17.  ABIs prior to the procedure showed 0.59 on the right and 0.69 on the left.  At postoperative follow-up on 07/10/2017 the  patient's ABIs showed right 0.77 and left 0.84.  Overall the patient did well postoperatively and her pain has improved.  The remaining pain was thought to be due  to spine related issues.  Assessment and plan -Aorta duplex will be done by Dr. Tod Persia office in 6 months due to strong family history of vascular disease  -BLE arterial duplex in 6 months -Recommended walking 30 minutes daily and having continued weight loss -Smoking cessation.  Started patient on Chantix.  Chronic GERD Patient came in today stating that she has epigastric abdominal pain that is sometimes 10/10 in intensity.  Patient states that she started noticing the abdominal pain when her tonics was decreased from 40 mg twice daily to 40 mg daily.  The patient is now on her regular dose of 40 mg twice daily.  She also states that she has difficulty swallowing food but does not have difficulty with fluids.  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  Colonoscopy done in 2016 showed 2 polyps in transverse colon and ascending colon and polypectomy was done.  There is a moderate diverticulitis seen throughout the colon.  Assessment and plan Due to patient's Protonix dose being changed recently will continue to assess  the patient's symptoms for another month before ordering repeat imaging to assess for other causes of reflux.  -Counseled patient to avoid-mints, caffeine, smoking, and other agents that can precipitate reflux -Reassess in 1 month  Right lower extremity swelling Patient's right lower extremity is more swollen than left.  Greater than 3 cm difference was seen on circumference measurement.  Patient has a Wells or 2 which places her at a moderate risk for DVT.  The patient does not have any pain in the lower extremity.  Assessment and plan Although the patient's right lower extremity swelling is likely postoperative will assess the patient for DVT.  -D-dimer ordered  Patient seen with Dr. Beryle Beams

## 2017-07-17 ENCOUNTER — Other Ambulatory Visit: Payer: Self-pay

## 2017-07-17 ENCOUNTER — Encounter: Payer: Self-pay | Admitting: Internal Medicine

## 2017-07-17 ENCOUNTER — Ambulatory Visit (HOSPITAL_COMMUNITY)
Admission: RE | Admit: 2017-07-17 | Discharge: 2017-07-17 | Disposition: A | Discharge status: Home / Self Care | Payer: Medicare HMO | Source: Ambulatory Visit | Attending: Family Medicine | Admitting: Family Medicine

## 2017-07-17 ENCOUNTER — Telehealth: Payer: Self-pay | Admitting: Internal Medicine

## 2017-07-17 ENCOUNTER — Ambulatory Visit (INDEPENDENT_AMBULATORY_CARE_PROVIDER_SITE_OTHER): Payer: Medicare HMO | Admitting: Internal Medicine

## 2017-07-17 VITALS — BP 149/84 | HR 96 | Temp 98.9°F | Ht 60.5 in | Wt 205.6 lb

## 2017-07-17 DIAGNOSIS — L299 Pruritus, unspecified: Secondary | ICD-10-CM | POA: Diagnosis not present

## 2017-07-17 DIAGNOSIS — F332 Major depressive disorder, recurrent severe without psychotic features: Secondary | ICD-10-CM | POA: Diagnosis not present

## 2017-07-17 DIAGNOSIS — K219 Gastro-esophageal reflux disease without esophagitis: Secondary | ICD-10-CM

## 2017-07-17 DIAGNOSIS — Z95820 Peripheral vascular angioplasty status with implants and grafts: Secondary | ICD-10-CM

## 2017-07-17 DIAGNOSIS — W19XXXA Unspecified fall, initial encounter: Secondary | ICD-10-CM

## 2017-07-17 DIAGNOSIS — K5732 Diverticulitis of large intestine without perforation or abscess without bleeding: Secondary | ICD-10-CM

## 2017-07-17 DIAGNOSIS — I739 Peripheral vascular disease, unspecified: Secondary | ICD-10-CM

## 2017-07-17 DIAGNOSIS — R600 Localized edema: Secondary | ICD-10-CM | POA: Diagnosis not present

## 2017-07-17 DIAGNOSIS — M5431 Sciatica, right side: Secondary | ICD-10-CM

## 2017-07-17 DIAGNOSIS — Z9181 History of falling: Secondary | ICD-10-CM

## 2017-07-17 DIAGNOSIS — I10 Essential (primary) hypertension: Secondary | ICD-10-CM | POA: Insufficient documentation

## 2017-07-17 DIAGNOSIS — F419 Anxiety disorder, unspecified: Secondary | ICD-10-CM

## 2017-07-17 DIAGNOSIS — R9431 Abnormal electrocardiogram [ECG] [EKG]: Secondary | ICD-10-CM | POA: Insufficient documentation

## 2017-07-17 DIAGNOSIS — E039 Hypothyroidism, unspecified: Secondary | ICD-10-CM | POA: Diagnosis not present

## 2017-07-17 DIAGNOSIS — Z86010 Personal history of colonic polyps: Secondary | ICD-10-CM

## 2017-07-17 DIAGNOSIS — R110 Nausea: Secondary | ICD-10-CM | POA: Diagnosis not present

## 2017-07-17 DIAGNOSIS — Z0000 Encounter for general adult medical examination without abnormal findings: Secondary | ICD-10-CM

## 2017-07-17 DIAGNOSIS — R1013 Epigastric pain: Secondary | ICD-10-CM | POA: Diagnosis not present

## 2017-07-17 DIAGNOSIS — E1151 Type 2 diabetes mellitus with diabetic peripheral angiopathy without gangrene: Secondary | ICD-10-CM

## 2017-07-17 DIAGNOSIS — M5432 Sciatica, left side: Secondary | ICD-10-CM

## 2017-07-17 DIAGNOSIS — Z79899 Other long term (current) drug therapy: Secondary | ICD-10-CM

## 2017-07-17 LAB — D-DIMER, QUANTITATIVE: D-Dimer, Quant: 0.73 ug/mL-FEU — ABNORMAL HIGH (ref 0.00–0.50)

## 2017-07-17 LAB — GLUCOSE, CAPILLARY: GLUCOSE-CAPILLARY: 90 mg/dL (ref 70–99)

## 2017-07-17 MED ORDER — DOXEPIN HCL 150 MG PO CAPS: 150.0000 mg | ORAL_CAPSULE | Freq: Every day | ORAL | 0 refills | Status: DC

## 2017-07-17 MED ORDER — LISINOPRIL 10 MG PO TABS: 10.0000 mg | ORAL_TABLET | Freq: Every day | ORAL | 0 refills | Status: DC

## 2017-07-17 MED ORDER — VARENICLINE TARTRATE 0.5 MG X 11 & 1 MG X 42 PO MISC: ORAL | 0 refills | Status: DC

## 2017-07-17 MED ORDER — GABAPENTIN 400 MG PO CAPS: ORAL_CAPSULE | ORAL | 0 refills | Status: DC

## 2017-07-17 NOTE — Assessment & Plan Note (Signed)
Patient has left SFA stent in 2013 at [facility], 2 stents in left external iliac artery in 2017.    The patient continued to have right lower extremity pain.  Dr. Gwenlyn Saran did an ultrasound-guided cannulation of the left femoral artery, aortogram with bilateral lower extremity runoff, arthrectomy of the right SFA with distal embolic protection, balloon angioplasty of the right SFA with a 5 mm balloon, and drug-eluting stent placement with a balloon in the right SFA on 06/03/17.  ABIs prior to the procedure showed 0.59 on the right and 0.69 on the left.  At postoperative follow-up on 07/10/2017 the  patient's ABIs showed right 0.77 and left 0.84.  Overall the patient did well postoperatively and her pain has improved.  The remaining pain was thought to be due to spine related issues.  Assessment and plan -Aorta duplex will be done by Dr. Tod Persia office in 6 months due to strong family history of vascular disease  -BLE arterial duplex in 6 months -Recommended walking 30 minutes daily and having continued weight loss -Smoking cessation.  Started patient on Chantix.

## 2017-07-17 NOTE — Assessment & Plan Note (Signed)
The patient's blood pressure during this visit 149/84. The patient is currently on amlodipine 10mg  qd, hydrochlorothiazide 25mg  daily, Lasix 20 mg daily.  Assessment and plan The patient's blood pressure is not adequately controlled.  She does not need Lasix at this time so we will discontinue. Per BMP done in February 2019 which shows the patient's renal function is normal she creatinine=1 GFR=67.  The patient will need additional hypertension medication and therefore started patient on lisinopril 10 mg daily.

## 2017-07-17 NOTE — Telephone Encounter (Signed)
Got a call from patient that [name] is trying to reach her and she wants to talk with her.  Patient was seen by [name] today in the clinic and a d-dimer was obtained because of concerns of DVT.  D-dimer came back positive and [name] was trying to reach patient regarding her results. Called back her and give her the message from [name] that she needs to go to emergency room as her d-dimer was elevated to get an venous Doppler of her leg to rule out DVT. Patient seems understanding.

## 2017-07-17 NOTE — Progress Notes (Signed)
The patient's d-dimer returned elevated at 0.73. Attempted to call patient numerous times to tell her that she needs to go to the ED to get a doppler done to further assess for possible DVT. Unfortunately, the patient's phone is going directly to voicemail.  Called patient's Daughter who is trying to reach her. Will continue to try contacting patient.    Lars Mage, MD Internal Medicine PGY2 QJSID:[phone] 07/17/2017, 4:43 PM

## 2017-07-17 NOTE — Assessment & Plan Note (Signed)
Patient's right lower extremity is more swollen than left.  Greater than 3 cm difference was seen on circumference measurement.  Patient has a Wells or 2 which places her at a moderate risk for DVT.  The patient does not have any pain in the lower extremity.  Assessment and plan Although the patient's right lower extremity swelling is likely postoperative will assess the patient for DVT.  -D-dimer ordered

## 2017-07-17 NOTE — Assessment & Plan Note (Signed)
The patient was started on sertraline 25 mg once daily at previous visit in May 2019. The patient called clinic and expressed that she would like to be switched to doxepin which she was previously on.  TH and has been monitoring the patient and informed us that she has been falling a lot lately.  Due to concern of the patient's recent falls requested that the patient return to clinic to evaluate her falls and determine appropriate medication for her anxiety and depression.   Assessment and plan  The patient states that she does not believe that her symptoms are well controlled on sertraline.  She failed therapy with Zoloft and Celexa in the past.  During today's visit the patient's GAD score was 9 and PHQ 9 8.  Since the patient states that she was doing well on doxepin previously will restart it.  -Start doxepin 150 mg daily

## 2017-07-17 NOTE — Assessment & Plan Note (Signed)
Patient states that she has been having numerous falls recently.  [facility] has also documented these falls as well.  States that she has not hit her head and usually last on her bilateral knees or arms.  She has not had any significant pain or injuries subsequent to the falls.   Patient notes that each time she falls she has had different precipitating events.  She notes dizziness sometimes and states that other times she feels as if her legs are going to give out.  The patient's home environment is clear of any environmental hair hazards.  She is wearing good footwear.  She is not on any significant centrally acting medications.  Orthostatic vital signs were negative.  The patient noted chest tightness 1-2 times daily.  EKG was done which showed normal sinus rhythm, left axis deviation, chronic anterior septal infarct changes.  The EKG was not significantly different from prior EKG in January 2019.  Assessment and plan Patient states that she is dizziness and some ringing in the ears occasionally and therefore she may have some vestibular dysfunction that may be causing her frequent falls.  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  Patient is currently taking a total of 1600 mg of gabapentin which she states has significantly decreased her numbness and tingling.  However, the patient states that she still sometimes feels numbness.  Therefore, will increase gabapentin from 1600 mg to 2000 mg daily by increasing her morning dose from 1 capsule to 2 capsules.  -Increase gabapentin from 1600 mg to 2000 mg daily

## 2017-07-17 NOTE — Assessment & Plan Note (Signed)
Patient came in today stating that she has epigastric abdominal pain that is sometimes 10/10 in intensity.  Patient states that she started noticing the abdominal pain when her tonics was decreased from 40 mg twice daily to 40 mg daily.  The patient is now on her regular dose of 40 mg twice daily.  She also states that she has difficulty swallowing food but does not have difficulty with fluids.  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  Colonoscopy done in 2016 showed 2 polyps in transverse colon and ascending colon and polypectomy was done.  There is a moderate diverticulitis seen throughout the colon.  Assessment and plan Due to patient's Protonix dose being changed recently will continue to assess the patient's symptoms for another month before ordering repeat imaging to assess for other causes of reflux.  -Counseled patient to avoid-mints, caffeine, smoking, and other agents that can precipitate reflux -Reassess in 1 month

## 2017-07-17 NOTE — Patient Instructions (Addendum)
It was a pleasure to see you today [name]. Please make the following changes:  -Stop Zoloft -Stop Lasix -Start doxepin -Endoscopy and colonoscopy done -Increased gabapentin to 2000 mg daily from prior 1600 mg daily -Start lisinopril 10 mg daily  Please follow-up in 1 month to assess blood pressure control and mood   If you have any questions or concerns, please call our clinic at [phone] between 9am-5pm and after hours call [phone] and ask for the internal medicine resident on call. If you feel you are having a medical emergency please call 911.   Thank you, we look forward to help you remain healthy!  Lars Mage, MD Internal Medicine PGY1  Varenicline oral tablets What is this medicine? VARENICLINE (var EN i kleen) is used to help people quit smoking. It can reduce the symptoms caused by stopping smoking. It is used with a patient support program recommended by your physician. This medicine may be used for other purposes; ask your health care provider or pharmacist if you have questions. COMMON BRAND NAME(S): Chantix What should I tell my health care provider before I take this medicine? They need to know if you have any of these conditions: -bipolar disorder, depression, schizophrenia or other mental illness -heart disease -if you often drink alcohol -kidney disease -peripheral vascular disease -seizures -stroke -suicidal thoughts, plans, or attempt; a previous suicide attempt by you or a family member -an unusual or allergic reaction to varenicline, other medicines, foods, dyes, or preservatives -pregnant or trying to get pregnant -breast-feeding How should I use this medicine? Take this medicine by mouth after eating. Take with a full glass of water. Follow the directions on the prescription label. Take your doses at regular intervals. Do not take your medicine more often than directed. There are 3 ways you can use this medicine to help you quit smoking; talk  to your health care professional to decide which plan is right for you: 1) you can choose a quit date and start this medicine 1 week before the quit date, or, 2) you can start taking this medicine before you choose a quit date, and then pick a quit date between day 8 and 35 days of treatment, or, 3) if you are not sure that you are able or willing to quit smoking right away, start taking this medicine and slowly decrease the amount you smoke as directed by your health care professional with the goal of being cigarette-free by week 12 of treatment. Stick to your plan; ask about support groups or other ways to help you remain cigarette-free. If you are motivated to quit smoking and did not succeed during a previous attempt with this medicine for reasons other than side effects, or if you returned to smoking after this treatment, speak with your health care professional about whether another course of this medicine may be right for you. A special MedGuide will be given to you by the pharmacist with each prescription and refill. Be sure to read this information carefully each time. Talk to your pediatrician regarding the use of this medicine in children. This medicine is not approved for use in children. Overdosage: If you think you have taken too much of this medicine contact a poison control center or emergency room at once. NOTE: This medicine is only for you. Do not share this medicine with others. What if I miss a dose? If you miss a dose, take it as soon as you can. If it is almost time for your next  dose, take only that dose. Do not take double or extra doses. What may interact with this medicine? -alcohol or any product that contains alcohol -insulin -other stop smoking aids -theophylline -warfarin This list may not describe all possible interactions. Give your health care provider a list of all the medicines, herbs, non-prescription drugs, or dietary supplements you use. Also tell them if you  smoke, drink alcohol, or use illegal drugs. Some items may interact with your medicine. What should I watch for while using this medicine? Visit your doctor or health care professional for regular check ups. Ask for ongoing advice and encouragement from your doctor or healthcare professional, friends, and family to help you quit. If you smoke while on this medication, quit again Your mouth may get dry. Chewing sugarless gum or sucking hard candy, and drinking plenty of water may help. Contact your doctor if the problem does not go away or is severe. You may get drowsy or dizzy. Do not drive, use machinery, or do anything that needs mental alertness until you know how this medicine affects you. Do not stand or sit up quickly, especially if you are an older patient. This reduces the risk of dizzy or fainting spells. Sleepwalking can happen during treatment with this medicine, and can sometimes lead to behavior that is harmful to you, other people, or property. Stop taking this medicine and tell your doctor if you start sleepwalking or have other unusual sleep-related activity. Decrease the amount of alcoholic beverages that you drink during treatment with this medicine until you know if this medicine affects your ability to tolerate alcohol. Some people have experienced increased drunkenness (intoxication), unusual or sometimes aggressive behavior, or no memory of things that have happened (amnesia) during treatment with this medicine. The use of this medicine may increase the chance of suicidal thoughts or actions. Pay special attention to how you are responding while on this medicine. Any worsening of mood, or thoughts of suicide or dying should be reported to your health care professional right away. What side effects may I notice from receiving this medicine? Side effects that you should report to your doctor or health care professional as soon as possible: -allergic reactions like skin rash, itching or  hives, swelling of the face, lips, tongue, or throat -acting aggressive, being angry or violent, or acting on dangerous impulses -breathing problems -changes in vision -chest pain or chest tightness -confusion, trouble speaking or understanding -new or worsening depression, anxiety, or panic attacks -extreme increase in activity and talking (mania) -fast, irregular heartbeat -feeling faint or lightheaded, falls -fever -pain in legs when walking -problems with balance, talking, walking -redness, blistering, peeling or loosening of the skin, including inside the mouth -ringing in ears -seeing or hearing things that aren't there (hallucinations) -seizures -sleepwalking -sudden numbness or weakness of the face, arm or leg -thoughts about suicide or dying, or attempts to commit suicide -trouble passing urine or change in the amount of urine -unusual bleeding or bruising -unusually weak or tired Side effects that usually do not require medical attention (report to your doctor or health care professional if they continue or are bothersome): -constipation -headache -nausea, vomiting -strange dreams -stomach gas -trouble sleeping This list may not describe all possible side effects. Call your doctor for medical advice about side effects. You may report side effects to FDA at 1-800-FDA-1088. Where should I keep my medicine? Keep out of the reach of children. Store at room temperature between 15 and 30 degrees C (59 and 86  degrees F). Throw away any unused medicine after the expiration date. NOTE: This sheet is a summary. It may not cover all possible information. If you have questions about this medicine, talk to your doctor, pharmacist, or health care provider.  2018 Elsevier/Gold Standard (2014-09-14 16:14:23)

## 2017-07-17 NOTE — Progress Notes (Signed)
Noted Thanks DrG 

## 2017-07-18 ENCOUNTER — Encounter (HOSPITAL_COMMUNITY): Payer: Self-pay

## 2017-07-18 ENCOUNTER — Emergency Department (HOSPITAL_COMMUNITY)
Admission: EM | Admit: 2017-07-18 | Discharge: 2017-07-18 | Disposition: A | Discharge status: Home / Self Care | Payer: Medicare HMO | Attending: Emergency Medicine | Admitting: Emergency Medicine

## 2017-07-18 ENCOUNTER — Emergency Department (HOSPITAL_BASED_OUTPATIENT_CLINIC_OR_DEPARTMENT_OTHER): Payer: Medicare HMO

## 2017-07-18 ENCOUNTER — Telehealth: Payer: Self-pay | Admitting: Internal Medicine

## 2017-07-18 DIAGNOSIS — Z7902 Long term (current) use of antithrombotics/antiplatelets: Secondary | ICD-10-CM | POA: Diagnosis not present

## 2017-07-18 DIAGNOSIS — F17210 Nicotine dependence, cigarettes, uncomplicated: Secondary | ICD-10-CM | POA: Insufficient documentation

## 2017-07-18 DIAGNOSIS — I10 Essential (primary) hypertension: Secondary | ICD-10-CM | POA: Insufficient documentation

## 2017-07-18 DIAGNOSIS — E039 Hypothyroidism, unspecified: Secondary | ICD-10-CM | POA: Diagnosis not present

## 2017-07-18 DIAGNOSIS — R2241 Localized swelling, mass and lump, right lower limb: Secondary | ICD-10-CM | POA: Insufficient documentation

## 2017-07-18 DIAGNOSIS — M7989 Other specified soft tissue disorders: Secondary | ICD-10-CM

## 2017-07-18 DIAGNOSIS — Z79899 Other long term (current) drug therapy: Secondary | ICD-10-CM | POA: Diagnosis not present

## 2017-07-18 DIAGNOSIS — Z794 Long term (current) use of insulin: Secondary | ICD-10-CM | POA: Diagnosis not present

## 2017-07-18 DIAGNOSIS — M79671 Pain in right foot: Secondary | ICD-10-CM | POA: Insufficient documentation

## 2017-07-18 DIAGNOSIS — E119 Type 2 diabetes mellitus without complications: Secondary | ICD-10-CM | POA: Diagnosis not present

## 2017-07-18 DIAGNOSIS — M79609 Pain in unspecified limb: Secondary | ICD-10-CM | POA: Diagnosis not present

## 2017-07-18 NOTE — Progress Notes (Signed)
Right lower extremity venous duplex completed. There is no evidence of a DVT or Baker's cyst. The SFA stent appears patent. [email or web address] 07/18/2017 12:56 PM

## 2017-07-18 NOTE — ED Notes (Addendum)
Patient transported to [facility] 

## 2017-07-18 NOTE — Telephone Encounter (Signed)
   Reason for call:   I received a call from [name] at 8:30 AM indicating questions about lab results.   Pertinent Data:   Patient seen in clinic by [name] yesterday for RLE swelling; screening D dimer was positive. She was called to go to the ED for dopplers as it was not possible at the time to get outpt u/s scheduled. Patient was unable to go to the ED yesterday and is planning on coming in today; she wanted to double check what she needed to say and get evaluated in the ED.    Assessment / Plan / Recommendations:   I discussed with her the reason for the D dimer and what an elevated result indicates; advised that she needed ultrasound of her RLE to look for a DVT. She stated understanding and appreciated the call. She is on her way to the ED.  As always, pt is advised that if symptoms worsen or new symptoms arise, they should go to an urgent care facility or to to ER for further evaluation.   Alphonzo Grieve, MD   07/18/2017, 8:28 AM

## 2017-07-18 NOTE — ED Triage Notes (Signed)
Pt had stent to right SFA performed 06-03-17 for right LE pain with PAD.  Pt was called by IM to come to ED for ultasound RLE to r/o DVT d/t D dimer was elevated.  Pt is still having pain in anterior RLE and swelling.

## 2017-07-18 NOTE — ED Provider Notes (Signed)
[facility] EMERGENCY DEPARTMENT Provider Note   CSN: [id number] Arrival date & time: 07/18/17  1119     History   Chief Complaint Chief Complaint  Patient presents with  . Leg Pain    HPI [name] is a 67 y.o. female.  HPI  [age]-year-old female with history of peripheral vascular disease status post angioplasty and stenting of right SNF a 522 seen in primary care office yesterday and noted to have right lower extremity swelling.  She was sent to for ultrasound of right lower extremity today.  Venous duplex ultrasound was completed and showed no evidence of DVT or Baker's cyst.  SFA stent appeared to be patent per the report.  She states swelling has decreased from yesterday.  She has a sore on the right small toe but it has been healing.  She has noticed some ongoing skin discoloration but no erythema, streaking, or fever.  Past Medical History:  Diagnosis Date  . Allergy   . Anal fissure   . Anxiety   . Cataract   . Chronic headaches   . Depression   . Diverticulosis   . GERD (gastroesophageal reflux disease)   . Glaucoma   . HLD (hyperlipidemia)   . Hypertension   . Hypothyroidism   . PAD (peripheral artery disease) ([place])   . PUD (peptic ulcer disease)   . TIA (transient ischemic attack)     Patient Active Problem List   Diagnosis Date Noted  . Fall 07/17/2017  . Edema of right lower extremity 07/17/2017  . Hematuria 05/16/2017  . Bilateral sciatica 03/28/2017  . Diverticulitis 03/11/2017  . DM (diabetes mellitus), type 2 with peripheral vascular complications ([place]) 49/67/5916  . Thrush 03/11/2017  . Intermittent palpitations 01/21/2017  . Muscle spasm 12/12/2016  . Pain and swelling of left lower leg 07/25/2015  . Knee pain, bilateral 03/30/2015  . Dysphagia 10/26/2014  . Blurry vision 09/19/2014  . Morbid obesity ([place]) 09/19/2014  . Left-sided weakness 04/20/2014  . Tobacco use disorder 04/19/2014  . Intermittent claudication  ([place]) 01/20/2013  . Eye mass 12/13/2012  . Cataract 12/13/2012  . Recurrent headache 12/13/2012  . Major depressive disorder, recurrent episode, severe ([place]) 10/29/2012  . Anxiety state 09/20/2012  . Abnormality of gait 04/21/2012  . PAD (peripheral artery disease) ([place]) 04/15/2012  . Hyperlipidemia 04/15/2012  . Chronic hypertension 04/15/2012  . Hypothyroidism 04/15/2012  . GERD (gastroesophageal reflux disease) 04/15/2012  . Routine health maintenance 04/15/2012    Past Surgical History:  Procedure Laterality Date  . ABDOMINAL AORTAGRAM N/A 01/20/2013   Procedure: ABDOMINAL Maxcine Ham;  Surgeon: Conrad [place], MD;  Location: [facility] CATH LAB;  Service: Cardiovascular;  Laterality: N/A;  . ABDOMINAL AORTOGRAM W/LOWER EXTREMITY N/A 06/03/2017   Procedure: ABDOMINAL AORTOGRAM W/LOWER EXTREMITY;  Surgeon: Waynetta Sandy, MD;  Location: [facility] CV LAB;  Service: Cardiovascular;  Laterality: N/A;  Bilateral  . ANAL FISSURE REPAIR    . AORTOGRAM    . BREAST REDUCTION SURGERY    . CATARACT EXTRACTION Left   . iliac artery angioplasty and stenting Left 12/2012  . PERIPHERAL VASCULAR ATHERECTOMY  06/03/2017   Procedure: PERIPHERAL VASCULAR ATHERECTOMY and Stent;  Surgeon: Waynetta Sandy, MD;  Location: [facility] CV LAB;  Service: Cardiovascular;;  Rt. SFA   . REDUCTION MAMMAPLASTY Bilateral   . SFA stent Left 2012   at [facility]  . STOMACH SURGERY    . TOTAL THYROIDECTOMY  OB History   None      Home Medications    Prior to Admission medications   Medication Sig Start Date End Date Taking? Authorizing Provider  acetaminophen (TYLENOL) 500 MG tablet Take 2 tablets (1,000 mg total) by mouth every 8 (eight) hours as needed for moderate pain. 06/13/17   Lars Mage, MD  Alcohol Swabs (B-D SINGLE USE SWABS BUTTERFLY) PADS Use as needed 06/23/17   Chundi, Verne Spurr, MD  amLODipine (NORVASC) 10 MG tablet Take 1 tablet (10 mg total) by mouth  daily. 06/13/17 09/11/17  Lars Mage, MD  atorvastatin (LIPITOR) 40 MG tablet Take 1 tablet (40 mg total) by mouth daily. 05/15/17 06/14/17  Lars Mage, MD  Blood Glucose Calibration (ACCU-CHEK AVIVA) SOLN  06/24/17   [provider]  celecoxib (CELEBREX) 100 MG capsule Take 100 mg by mouth daily.    [provider]  Cholecalciferol (VITAMIN D3) 400 units CAPS Take 400 Units by mouth daily. 06/13/17   Chundi, Verne Spurr, MD  cilostazol (PLETAL) 100 MG tablet Take 100 mg by mouth 2 (two) times daily.    [provider]  clopidogrel (PLAVIX) 75 MG tablet Take 1 tablet (75 mg total) by mouth daily. For Peripheral Arterial disease 07/07/17 10/05/17  Lars Mage, MD  doxepin (SINEQUAN) 150 MG capsule Take 1 capsule (150 mg total) by mouth at bedtime. 07/17/17 08/16/17  Chundi, Verne Spurr, MD  gabapentin (NEURONTIN) 400 MG capsule TAKE 2 CAPSULE BY MOUTH EVERY MORNING, 1 CAPSULE AT LUNCH AND 2 CAPSULES AT BEDTIME 07/17/17   Chundi, Vahini, MD  glucose blood (ACCU-CHEK AVIVA) test strip Use 3 times daily to check blood sugar. 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 Box of 100 equals 1 month testing 05/19/17   Lars Mage, MD  hydrochlorothiazide (HYDRODIURIL) 12.5 MG tablet Take 2 tablets (25 mg total) by mouth daily. 06/13/17 09/11/17  Chundi, Verne Spurr, MD  insulin glargine (LANTUS) 100 UNIT/ML injection Inject 0.2 mLs (20 Units total) into the skin at bedtime. 06/13/17 09/11/17  Lars Mage, MD  Insulin Syringe-Needle U-100 31G X 15/64" 0.3 ML MISC Use to inject insulin daily 03/25/17   Chundi, Verne Spurr, MD  Lancets (ACCU-CHEK SOFT TOUCH) lancets Use as instructed 06/23/17   Chundi, Verne Spurr, MD  levothyroxine (SYNTHROID, LEVOTHROID) 112 MCG tablet TAKE 1 TABLET BY MOUTH ONCE DAILY BEFORE BREAKFAST 06/13/17   Chundi, Vahini, MD  lisinopril (PRINIVIL,ZESTRIL) 10 MG tablet Take 1 tablet (10 mg total) by mouth daily. 07/17/17 08/16/17  Lars Mage, MD  metFORMIN (GLUCOPHAGE-XR) 500 MG 24 hr tablet Take 1 tablet  (500 mg total) by mouth 2 (two) times daily before lunch and supper. 06/13/17   Chundi, Verne Spurr, MD  OVER THE COUNTER MEDICATION Take 500 mg by mouth daily. Potassium 500mg     [provider]  pantoprazole (PROTONIX) 40 MG tablet Take 1 tablet (40 mg total) by mouth 2 (two) times daily. 07/10/17 10/08/17  Lars Mage, MD  Polyethyl Glycol-Propyl Glycol (SYSTANE OP) Place 1 drop into both eyes daily.    [provider]  varenicline (CHANTIX STARTING MONTH PAK) 0.5 MG X 11 & 1 MG X 42 tablet Take one 0.5 mg tablet by mouth once daily for 3 days, then increase to one 0.5 mg tablet twice daily for 4 days, then increase to one 1 mg tablet twice daily. 07/17/17   Lars Mage, MD    Family History Family History  Problem Relation Age of Onset  . Peripheral vascular disease Mother   . Heart disease Mother   .  Hypertension Mother   . AAA (abdominal aortic aneurysm) Father   . Hypertension Father   . Aneurysm Father   . Uterine cancer Sister        mets to brain  . Liver cancer Brother   . Stomach cancer Maternal Aunt        x2  . Stomach cancer Paternal Aunt   . Heart disease Brother   . Kidney cancer Brother   . Anuerysm Brother        brain  . HIV Brother        x2    Social History Social History   Tobacco Use  . Smoking status: Current Some Day Smoker    Packs/day: 0.50    Years: 0.50    Pack years: 0.25    Types: Cigarettes  . Smokeless tobacco: Never Used  . Tobacco comment: 1 pack per 2 days  Substance Use Topics  . Alcohol use: No    Alcohol/week: 0.0 oz  . Drug use: Yes    Types: Cocaine, Marijuana    Comment: marijuana: 2-3 times per month     Allergies   Gadolinium derivatives; Asa [aspirin]; Crestor [rosuvastatin calcium]; and Penicillins   Review of Systems Review of Systems  All other systems reviewed and are negative.    Physical Exam Updated Vital Signs BP 139/77 (BP Location: Right Arm)   Pulse 93   Temp 97.8 F (36.6 C) (Oral)    Resp 18   Ht 1.549 m (5\' 1" )   Wt 93 kg (205 lb)   SpO2 95%   BMI 38.73 kg/m   Physical Exam  Constitutional: She is oriented to person, place, and time. She appears well-developed and well-nourished. No distress.  HENT:  Head: Normocephalic and atraumatic.  Right Ear: External ear normal.  Left Ear: External ear normal.  Nose: Nose normal.  Eyes: Pupils are equal, round, and reactive to light. Conjunctivae and EOM are normal.  Neck: Normal range of motion. Neck supple.  Cardiovascular: Normal rate and regular rhythm.  Pulmonary/Chest: Effort normal.  Abdominal: Soft.  Musculoskeletal: Normal range of motion.  Neurological: She is alert and oriented to person, place, and time. She exhibits normal muscle tone. Coordination normal.  Skin: Skin is warm and dry.  Skin changes noted bilateral feet. There is some mild edema but appears equal bilaterally Source of the fifth toe appears to be healing well Mild tenderness across dorsum of foot Pulses not palpated in foot. Toes are pink and capillary refill is less than 2 seconds  Psychiatric: She has a normal mood and affect. Her behavior is normal. Thought content normal.  Nursing note and vitals reviewed.    ED Treatments / Results  Labs (all labs ordered are listed, but only abnormal results are displayed) Labs Reviewed - No data to display  EKG None  Radiology No results found.  Procedures Procedures (including critical care time)  Medications Ordered in ED Medications - No data to display   Initial Impression / Assessment and Plan / ED Course  I have reviewed the triage vital signs and the nursing notes.  Pertinent labs & imaging results that were available during my care of the patient were reviewed by me and considered in my medical decision making (see chart for details).     Doppler study done during ED evaluation reveals no evidence of DVT and appears to have patent graft.  Patient discussed with me that  her doctors also felt she may have some neuropathic  pain.  Peers stable here in the ED to be discharged for follow-up.  We have discussed return precautions and need for follow-up and she voices understanding.  Final Clinical Impressions(s) / ED Diagnoses   Final diagnoses:  Right foot pain    ED Discharge Orders    None       Pattricia Boss, MD 07/18/17 1404

## 2017-07-18 NOTE — Discharge Instructions (Addendum)
Elevate foot Recheck with Dr. Donzetta Matters and primary doctor next week Return if worse at any time especially worsening pain, redness, or fevert

## 2017-07-20 ENCOUNTER — Encounter: Payer: Self-pay | Admitting: Internal Medicine

## 2017-07-20 ENCOUNTER — Other Ambulatory Visit: Payer: Self-pay | Admitting: Internal Medicine

## 2017-07-20 DIAGNOSIS — E785 Hyperlipidemia, unspecified: Secondary | ICD-10-CM

## 2017-07-20 NOTE — Progress Notes (Signed)
Was able to reach patient and she was seen in the emergency department for doppler to assess for DVT. Doppler did not show presence of dvt or baker cyst.   Lars Mage, MD Internal Medicine PGY2 Pager:[phone] 07/20/2017, 11:32 AM

## 2017-07-21 NOTE — Progress Notes (Signed)
thanks

## 2017-07-22 ENCOUNTER — Other Ambulatory Visit: Payer: Self-pay

## 2017-07-22 DIAGNOSIS — Z8249 Family history of ischemic heart disease and other diseases of the circulatory system: Secondary | ICD-10-CM

## 2017-07-22 DIAGNOSIS — I739 Peripheral vascular disease, unspecified: Secondary | ICD-10-CM

## 2017-07-22 DIAGNOSIS — M79605 Pain in left leg: Secondary | ICD-10-CM

## 2017-07-22 DIAGNOSIS — I779 Disorder of arteries and arterioles, unspecified: Secondary | ICD-10-CM

## 2017-07-22 DIAGNOSIS — M79662 Pain in left lower leg: Secondary | ICD-10-CM

## 2017-07-22 DIAGNOSIS — M7989 Other specified soft tissue disorders: Secondary | ICD-10-CM

## 2017-07-22 DIAGNOSIS — M79604 Pain in right leg: Secondary | ICD-10-CM

## 2017-07-24 ENCOUNTER — Telehealth: Payer: Self-pay | Admitting: *Deleted

## 2017-07-24 NOTE — Telephone Encounter (Signed)
Fax from [facility] stating received rx for Pletal 100 mg and she is also on Plavix 75 mg. Wants to know if pt is on both or one of the other? Thanks

## 2017-07-29 NOTE — Telephone Encounter (Signed)
The patient had vascularization procedures and is asymptomatic so will now stop pletal. Thanks.

## 2017-07-30 ENCOUNTER — Encounter: Payer: Self-pay | Admitting: Internal Medicine

## 2017-07-30 ENCOUNTER — Telehealth: Payer: Self-pay | Admitting: *Deleted

## 2017-07-30 NOTE — Telephone Encounter (Signed)
Called pt - no answer; left message to call the office . 

## 2017-07-30 NOTE — Progress Notes (Signed)
Attempted to call patient to tell her to stop taking cilastazol as she is also on plavix and both combined have an increased bleeding risk. Furthermore, the patient had vascular procedures done recently and is asymptomatic subsequent to it so she no longer needs cilastazol (pletal). The patient was not able to be reached. Will continue to try to call her. Refill was denied and took medication off med list.   Lars Mage, MD Internal Medicine PGY2 Pager:[phone] 07/30/2017, 10:08 AM

## 2017-07-30 NOTE — Telephone Encounter (Signed)
Return call from pt - instructed to stop Cilostazol (Pletal) per Dr Maricela Bo and to continue Plavix. Voiced understanding. She asked about Lasix - informed to stop Lasix and Zoloft per office visit on 7/5; stated she just wanted to be sure.

## 2017-07-30 NOTE — Telephone Encounter (Signed)
[facility] made aware to stop Pletal per Dr Maricela Bo.

## 2017-07-30 NOTE — Telephone Encounter (Signed)
-----   Message from Lars Mage, MD sent at 07/30/2017 10:09 AM EDT ----- Kermit Balo Morning Holley Raring and Margaretha Glassing you both are doing great! I was unfortunately not able to reach this patient to tell her to stop pletal. Her phone usually goes to voicemail and I have a hard time reaching her even previously.   Can you please try calling her again. I have documented the reason for discontinuation in a note in the chart. Thanks!  Best,  [employer]

## 2017-07-30 NOTE — Telephone Encounter (Signed)
Perfect, thanks Glenda!

## 2017-08-04 ENCOUNTER — Ambulatory Visit: Payer: Self-pay | Admitting: *Deleted

## 2017-08-10 ENCOUNTER — Encounter: Payer: Self-pay | Admitting: *Deleted

## 2017-08-10 ENCOUNTER — Other Ambulatory Visit: Payer: Self-pay | Admitting: *Deleted

## 2017-08-10 NOTE — Patient Outreach (Signed)
[facility]) Care Management  08/10/2017  [name] 04/20/1950 [id number]   [place] Monthly Outreach  Referral Date:06/09/2017 Referral Source:Transfer from [place] Reason for Referral:Continued Disease Management Education Insurance:[employer] Medicare   Outreach Attempt:  Outreach attempt #1 to patient for monthly follow up. No answer. RN Health Coach left HIPAA compliant voicemail message along with contact information.  Plan:  RN Health Coach will send unsuccessful outreach letter to patient.  RN Health Coach will make another outreach attempt to patient within 3-4 business days if no return call back from patient.   [place] [phone] [email or web address]

## 2017-08-10 NOTE — Patient Outreach (Signed)
[facility]) Care Management  08/10/2017  VERONNICA HENNINGS [birth year] [id number]   [place] Monthly Outreach  Referral Date:06/09/2017 Referral Source:Transfer from [place] Reason for Referral:Continued Disease Management Education Insurance:[employer]   Outreach Attempt:  Received telephone call back from patient.  HIPAA verified with patient.  Patient stating she is out of town with her niece at this time.  Reports she feels her legs are stronger, but still reports frequent instances of imbalance and near falls.  One fall in the last month.  Falls precautions and preventions discussed with her.  Fasting blood sugar this morning was 135 with fasting ranges of 87-135.  Patient stating her night time blood sugar has ranged up to 235 and she feels it is related to her diet.  States she gets food from the food bank, but food is not carbohydrate restricted.  Discussed meals on wheels and possible [facility] Social Work referral.  Patient declines at this time.  States she is going to have her friends buy her food and store at their home.  Patient also reporting she is skeptical about taking Chantix to assist with smoking cessation due to her history of suicide.  Encouraged patient to discuss her concerns with her primary care provider.  Appointments:   Patient has seen [name] on 07/17/2017 and has follow up scheduled for 08/21/2017.  Plan: RN Health Coach will make next monthly outreach to patient in the month of August.  Tahjir Silveria RN [place] [phone] [email or web address]

## 2017-08-21 ENCOUNTER — Encounter (INDEPENDENT_AMBULATORY_CARE_PROVIDER_SITE_OTHER): Payer: Self-pay

## 2017-08-21 ENCOUNTER — Ambulatory Visit (INDEPENDENT_AMBULATORY_CARE_PROVIDER_SITE_OTHER): Payer: Medicare HMO | Admitting: Internal Medicine

## 2017-08-21 ENCOUNTER — Other Ambulatory Visit: Payer: Self-pay

## 2017-08-21 ENCOUNTER — Encounter: Payer: Self-pay | Admitting: Internal Medicine

## 2017-08-21 VITALS — BP 141/70 | HR 99 | Temp 98.1°F | Ht 60.5 in | Wt 204.0 lb

## 2017-08-21 DIAGNOSIS — K219 Gastro-esophageal reflux disease without esophagitis: Secondary | ICD-10-CM

## 2017-08-21 DIAGNOSIS — I10 Essential (primary) hypertension: Secondary | ICD-10-CM

## 2017-08-21 DIAGNOSIS — Z0000 Encounter for general adult medical examination without abnormal findings: Secondary | ICD-10-CM

## 2017-08-21 DIAGNOSIS — Z794 Long term (current) use of insulin: Secondary | ICD-10-CM

## 2017-08-21 DIAGNOSIS — E1151 Type 2 diabetes mellitus with diabetic peripheral angiopathy without gangrene: Secondary | ICD-10-CM | POA: Diagnosis not present

## 2017-08-21 DIAGNOSIS — F332 Major depressive disorder, recurrent severe without psychotic features: Secondary | ICD-10-CM | POA: Diagnosis not present

## 2017-08-21 DIAGNOSIS — R339 Retention of urine, unspecified: Secondary | ICD-10-CM

## 2017-08-21 DIAGNOSIS — Z23 Encounter for immunization: Secondary | ICD-10-CM

## 2017-08-21 DIAGNOSIS — Z79899 Other long term (current) drug therapy: Secondary | ICD-10-CM | POA: Diagnosis not present

## 2017-08-21 LAB — POCT GLYCOSYLATED HEMOGLOBIN (HGB A1C): HEMOGLOBIN A1C: 6.1 % — AB (ref 4.0–5.6)

## 2017-08-21 LAB — GLUCOSE, CAPILLARY: GLUCOSE-CAPILLARY: 88 mg/dL (ref 70–99)

## 2017-08-21 MED ORDER — LISINOPRIL-HYDROCHLOROTHIAZIDE 20-25 MG PO TABS: 1.0000 | ORAL_TABLET | Freq: Every day | ORAL | 1 refills | Status: DC

## 2017-08-21 NOTE — Progress Notes (Signed)
   CC: Hypertension and major depressive disorder follow-up  HPI:  [name] is a 67 y.o. with diabetes mellitus type 2, peripheral artery disease, retention, GERD, depressive disorder who presents for hypertension follow-up. Please see problem based charting for evaluation, assessment, and plan.  Past Medical History:  Diagnosis Date  . Allergy   . Anal fissure   . Anxiety   . Cataract   . Chronic headaches   . Depression   . Diverticulosis   . GERD (gastroesophageal reflux disease)   . Glaucoma   . HLD (hyperlipidemia)   . Hypertension   . Hypothyroidism   . PAD (peripheral artery disease) ([place])   . PUD (peptic ulcer disease)   . TIA (transient ischemic attack)    Review of Systems:   Denies depression, anxiety, sob, chest pain  Physical Exam:  Vitals:   08/21/17 1400  BP: (!) 141/70  Pulse: 99  Temp: 98.1 F (36.7 C)  TempSrc: Oral  SpO2: 99%  Weight: 204 lb (92.5 kg)  Height: 5' 0.5" (1.537 m)   Physical Exam  Constitutional: She appears well-developed and well-nourished. No distress.  HENT:  Head: Normocephalic and atraumatic.  Eyes: Conjunctivae are normal.  Cardiovascular: Normal rate, regular rhythm and normal heart sounds.  Respiratory: Effort normal and breath sounds normal. No respiratory distress. She has no wheezes.  GI: Soft. Bowel sounds are normal. She exhibits no distension. There is no tenderness.  Musculoskeletal: She exhibits no edema.  Neurological: She is alert.  Skin: She is not diaphoretic.  Psychiatric: She has a normal mood and affect. Her behavior is normal. Judgment and thought content normal.    Assessment & Plan:   See Encounters Tab for problem based charting.  Hypertension The patient's blood pressure during this visit was 141/70.  Patient is currently taking Hydrochlorothiazide 25 mg daily, lisinopril 10 mg daily, and amlodipine 10 mg daily.  Patient states that she is taking her medication daily  Assessment and  plan Patient's blood pressure is not ideally controlled at <130,80.  Therefore, decided to increase lisinopril dosage to 20 mg daily.  Since the patient has a large pill burden combined the medications to Zestoretic 20-25 mg daily.  Diabetes mellitus type 2 The patient's last A1c was in May 2019 during which time it was 8.1.  She is currently taking metformin 500 mg twice daily, Lantus 20 units nightly.  Patient states that her home blood glucose readings have been ranging between 100-150.  Her A1c at this visit was 6.1.  Assessment and plan Patient's diabetes is well controlled.  Therefore, we will continue current regimen.  Will place ophthalmology referral for cataracts and diabetic retinopathy check.  Follow-up in 3 months.  Major depressive disorder Patient states that her mood is significantly better while on doxepin 150 mg daily. The patient's GAD and PHQ 9 scores were 11 and 5 respectively.  The patient is currently on doxepin 150 mg nightly.  Assessment and plan Subjective measures indicate that the patient's mood has improved significantly.  Therefore recommend continue in Doxepin 150 mg nightly.  Health Maintenance Patient received a Tdap vaccine today  Patient discussed with Dr. Daryll Drown

## 2017-08-21 NOTE — Assessment & Plan Note (Signed)
The patient's last A1c was in May 2019 during which time it was 8.1.  She is currently taking metformin 500 mg twice daily, Lantus 20 units nightly.  Patient states that her home blood glucose readings have been ranging between 100-150.  Her A1c at this visit was 6.1.  Assessment and plan Patient's diabetes is well controlled.  Therefore, we will continue current regimen.  Will place ophthalmology referral for cataracts and diabetic retinopathy check.  Follow-up in 3 months.

## 2017-08-21 NOTE — Assessment & Plan Note (Signed)
The patient's blood pressure during this visit was 141/70.  Patient is currently taking Hydrochlorothiazide 25 mg daily, lisinopril 10 mg daily, and amlodipine 10 mg daily.  Patient states that she is taking her medication daily  Assessment and plan Patient's blood pressure is not ideally controlled at <130,80.  Therefore, decided to increase lisinopril dosage to 20 mg daily.  Since the patient has a large pill burden combined the medications to Zestoretic 20-25 mg daily.

## 2017-08-21 NOTE — Assessment & Plan Note (Signed)
Patient states that her mood is significantly better while on doxepin 150 mg daily. The patient's GAD and PHQ 9 scores were 11 and 5 respectively.  The patient is currently on doxepin 150 mg nightly.  Assessment and plan Subjective measures indicate that the patient's mood has improved significantly.  Therefore recommend continue in Doxepin 150 mg nightly.

## 2017-08-21 NOTE — Assessment & Plan Note (Signed)
Patient received a Tdap vaccine today

## 2017-08-21 NOTE — Patient Instructions (Addendum)
It was a pleasure to see you today Ms. Figeroa. Please make the following changes:  -Please start taking combined pill Zestoric (lisinopril-hydrochlorothiazide) 20-25mg  once daily -Please continue taking all of your other medications -Follow up in 3 months  If you have any questions or concerns, please call our clinic at [phone] between 9am-5pm and after hours call [phone] and ask for the internal medicine resident on call. If you feel you are having a medical emergency please call 911.   Thank you, we look forward to help you remain healthy!  Lars Mage, MD Internal Medicine PGY2

## 2017-08-24 NOTE — Progress Notes (Signed)
Internal Medicine Clinic Attending  Case discussed with Dr. Chundi at the time of the visit.  We reviewed the resident's history and exam and pertinent patient test results.  I agree with the assessment, diagnosis, and plan of care documented in the resident's note. 

## 2017-08-25 ENCOUNTER — Other Ambulatory Visit: Payer: Self-pay | Admitting: Internal Medicine

## 2017-08-28 ENCOUNTER — Other Ambulatory Visit: Payer: Self-pay | Admitting: Internal Medicine

## 2017-08-28 DIAGNOSIS — F332 Major depressive disorder, recurrent severe without psychotic features: Secondary | ICD-10-CM

## 2017-08-31 ENCOUNTER — Other Ambulatory Visit: Payer: Self-pay | Admitting: *Deleted

## 2017-08-31 ENCOUNTER — Telehealth: Payer: Self-pay | Admitting: *Deleted

## 2017-08-31 ENCOUNTER — Encounter: Payer: Self-pay | Admitting: *Deleted

## 2017-08-31 NOTE — Telephone Encounter (Signed)
Phone note opened in error - no call placed or received at this time. Yvonna Alanis, RN, 08/31/17- 3P

## 2017-08-31 NOTE — Patient Outreach (Signed)
[facility]) Care Management  08/31/2017  [name] 04-15-1950 [id number]   [place] Monthly Outreach  Referral Date:06/09/2017 Referral Source:Transfer from [place] Reason for Referral:Continued Disease Management Education Insurance:[employer]   Outreach Attempt:  Successful telephone outreach to patient for monthly follow up.  HIPAA verified with patient.  Patient excited about recent lab results, stating her latest Hgb A1C is down to 6.1.  Congratulated patient on her latest lab results and her hard work.  Reports fasting blood sugar this morning was 119 with fasting ranges of 80-110's.  Does report about 3 episodes of hypoglycemic events below 70's, typically in the morning.  Encouraged patient to make sure she eats bedtime snack to prevent morning hypoglycemia.    Appointments:  Patient attended appointment with primary provider on 08/21/2017 and has follow up appointment scheduled for 11/20/2017.  Plan:  RN Health Coach will make next monthly outreach to patient in the month of September.   [place] [phone] [email or web address]

## 2017-09-04 ENCOUNTER — Other Ambulatory Visit: Payer: Self-pay | Admitting: Pharmacist

## 2017-09-04 NOTE — Patient Outreach (Signed)
[facility]) Care Management  09/04/2017  [name] [birth year] [id number]   Receive a voicemail message from [place] Apo requesting a call back. Leave a HIPAA compliant message on the patient's voicemail.   Harlow Asa, PharmD, [place] [facility] [phone]

## 2017-09-17 ENCOUNTER — Other Ambulatory Visit: Payer: Self-pay | Admitting: Internal Medicine

## 2017-09-17 DIAGNOSIS — K219 Gastro-esophageal reflux disease without esophagitis: Secondary | ICD-10-CM

## 2017-09-22 ENCOUNTER — Other Ambulatory Visit: Payer: Self-pay | Admitting: Internal Medicine

## 2017-10-01 ENCOUNTER — Encounter: Payer: Self-pay | Admitting: *Deleted

## 2017-10-01 ENCOUNTER — Other Ambulatory Visit: Payer: Self-pay | Admitting: *Deleted

## 2017-10-01 NOTE — Patient Outreach (Signed)
[facility]) Care Management  [name]  10/01/2017   [name] 1950/09/17 [id number]   [place] Monthly Outreach   Referral Date:  06/09/2017 Referral Source:  Transfer from [place]  Reason for Referral:  Continued Disease Management Education Insurance:  [facility] Medicare   Outreach Attempt:  Successful telephone outreach to patient, after her leaving message requesting call.  HIPAA verified with patient.  Patient stating she has been getting error messages on her CBG meter and usually has to check her blood sugar about 3 times before she gets a reading.  States she has never changed her battery.  Instructed and encouraged patient to take her CBG meter to a local pharmacy and request Pharmacy staff to assist her in changing the battery.  Also encouraged patient to contact the 800 number listed on the back of the meter to assist with troubleshooting the meter if the new battery does not work. If unable to get meter working, patient instructed to contact physician to get new prescription for meter.  Patient also reporting she has been waking up in the mornings really shaky and her blood sugars in the 70's.  States this happens about 3 times a week.  This mornings fasting blood sugar was 79.  Reports blood sugar throughout the day has been ranging 120-160's.  Encouraged patient to contact primary care provider for possible reduction in her insulin dose.  Patient reporting she is having periods of anxiety and depression; and is requesting assistance with arranging therapy.  [facility] Social Work referral discussed and patient verbally agrees.  Encounter Medications:  Outpatient Encounter Medications as of 10/01/2017  Medication Sig Note  . ACCU-CHEK SOFTCLIX LANCETS lancets Use to check blood sugar up to 4 times daily. 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 acetaminophen (TYLENOL) 500 MG tablet Take 2 tablets (1,000 mg total) by mouth every 8 (eight)  hours as needed for moderate pain.   . Alcohol Swabs (B-D SINGLE USE SWABS REGULAR) PADS USE AS NEEDED   . Blood Glucose Calibration (ACCU-CHEK AVIVA) SOLN    . clopidogrel (PLAVIX) 75 MG tablet Take 1 tablet (75 mg total) by mouth daily. For Peripheral Arterial disease   . doxepin (SINEQUAN) 150 MG capsule TAKE 1 CAPSULE   AT BEDTIME.   Marland Kitchen gabapentin (NEURONTIN) 400 MG capsule TAKE 2 CAPSULE BY MOUTH EVERY MORNING, 1 CAPSULE AT LUNCH AND 2 CAPSULES AT BEDTIME   . glucose blood (ACCU-CHEK AVIVA) test strip Use 3 times daily to check blood sugar. 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 Box of 100 equals 1 month testing   . Insulin Syringe-Needle U-100 31G X 15/64" 0.3 ML MISC Use to inject insulin daily   . levothyroxine (SYNTHROID, LEVOTHROID) 112 MCG tablet TAKE 1 TABLET BY MOUTH ONCE DAILY BEFORE BREAKFAST   . lisinopril-hydrochlorothiazide (PRINZIDE,ZESTORETIC) 20-25 MG tablet Take 1 tablet by mouth daily.   . metFORMIN (GLUCOPHAGE-XR) 500 MG 24 hr tablet Take 1 tablet (500 mg total) by mouth 2 (two) times daily before lunch and supper.   Marland Kitchen OVER THE COUNTER MEDICATION Take 500 mg by mouth daily. Potassium 565m   . pantoprazole (PROTONIX) 40 MG tablet TAKE 1 TABLET TWICE DAILY   . Polyethyl Glycol-Propyl Glycol (SYSTANE OP) Place 1 drop into both eyes daily.   .Marland KitchenamLODipine (NORVASC) 10 MG tablet Take 1 tablet (10 mg total) by mouth daily. 10/01/2017: Reports continues to take  . atorvastatin (LIPITOR) 40 MG tablet TAKE 1 TABLET EVERY DAY (  Patient not taking: Reported on 10/01/2017) 10/01/2017: Is not taking due to making her itch  . celecoxib (CELEBREX) 100 MG capsule Take 100 mg by mouth daily. 10/01/2017: Reports not taking  . Cholecalciferol (VITAMIN D3) 400 units CAPS Take 400 Units by mouth daily. (Patient not taking: Reported on 10/01/2017) 07/07/2017: Not currently taking  . insulin glargine (LANTUS) 100 UNIT/ML injection Inject 0.2 mLs (20 Units total) into the skin at bedtime. 10/01/2017: Reports  continues to take 20 units at bedtime  . varenicline (CHANTIX STARTING MONTH PAK) 0.5 MG X 11 & 1 MG X 42 tablet Take one 0.5 mg tablet by mouth once daily for 3 days, then increase to one 0.5 mg tablet twice daily for 4 days, then increase to one 1 mg tablet twice daily. (Patient not taking: Reported on 10/01/2017) 10/01/2017: Patient has not started this medication yet   No facility-administered encounter medications on file as of 10/01/2017.     Functional Status:  In your present state of health, do you have any difficulty performing the following activities: 08/21/2017 07/07/2017  Hearing? N N  Comment - -  Vision? N N  Difficulty concentrating or making decisions? N Y  Comment - forgetful at times, TIA's causing short term memory loss  Walking or climbing stairs? Y Y  Comment winded  multiple falls, can not stand for long periods of time  Dressing or bathing? N Y  Comment - -  Doing errands, shopping? N Y  Comment - has transportation issues at time  Conservation officer, nature and eating ? - N  Using the Toilet? - N  In the past six months, have you accidently leaked urine? - Y  Do you have problems with loss of bowel control? - N  Managing your Medications? - N  Managing your Finances? - N  Housekeeping or managing your Housekeeping? - N  Some recent data might be hidden    Fall/Depression Screening: Fall Risk  08/21/2017 08/10/2017 07/17/2017  Falls in the past year? Yes Yes Yes  Comment - reports one fall in the last month -  Number falls in past yr: 2 or more 2 or more 2 or more  Comment - - -  Injury with Fall? No No No  Comment - - -  Risk Factor Category  High Fall Risk - High Fall Risk  Risk for fall due to : - - Impaired balance/gait;Impaired mobility;History of fall(s);Medication side effect  Risk for fall due to: Comment - - -  Follow up Falls prevention discussed Education provided;Falls prevention discussed Falls evaluation completed;Education provided;Falls prevention discussed    PHQ 2/9 Scores 08/21/2017 07/20/2017 07/17/2017 07/07/2017 05/15/2017 04/27/2017 03/27/2017  PHQ - 2 Score 2 2 6 3 1 1 1   PHQ- 9 Score 5 8 21 8 7 7  -   THN CM Care Plan Problem One     Most Recent Value  Care Plan Problem One  Knowledge deficit related to self care management of diabetes  Role Documenting the Problem One  [place] for Problem One  Active  THN Long Term Goal   Patient will maintain A1C of 7 or below in the next 90 days.  THN Long Term Goal Start Date  08/31/17  Interventions for Problem One Long Term Goal  Reviewed and discussed current care plan and goals, [facility] Social Work referral for anxiety and depression resources, reviewed medications and encouraged compliance, encouraged to speak with physician concerning hypoglycemic events, discussed importance of eating at  least 3 balanced meals throughout the day  THN CM Short Term Goal #1   Patient will report decreasing cigarettes to 5 day in the next 30 days.  THN CM Short Term Goal #1 Start Date  10/01/17  [facility] CM Short Term Goal #1 Met Date  10/01/17  Interventions for Short Term Goal #1  Congratulated patient on reducing her cigarettes per day, encouraged patient to continue to reduce her daily use, encouraged patient to discuss other medical options other than chantix with her primary care provider, discussed importance of smoking cessation  THN CM Short Term Goal #2   Patient will report no more than 3 hypoglycemic events in the next 30 days.  THN CM Short Term Goal #2 Start Date  10/01/17  Interventions for Short Term Goal #2  Reviewed signs and symptoms of hypoglycemia with patient, reviewed how many episodes of hypoglycemia patient is having in a week and instructed patient to call her physician and notify them of hypoglycemic events for possible insulin reduction, instructed patient to take curent CBG meter to local pharmacy and have pharmacy staff assist her in [place] battery in meter, encouraged patient to call 800  number on back of meter for trouble shooting if meter continues to give error message after changing the batteries, encouraged patient to eat well balanced meals throughout the day and a good bedtime snack to help prevent hypoglycemia     Appointments:  States she has scheduled follow up appointment with primary care provider on 11/20/2017.    Plan: RN Health Coach will send [facility] SW referral for possible assistance with community resources related to depression. RN Health Coach will send primary care provider Quarterly Update. RN Health Coach will make next monthly telephone outreach to patient in the month of October.  [place] [phone] [email or web address]

## 2017-10-02 ENCOUNTER — Other Ambulatory Visit: Payer: Self-pay | Admitting: *Deleted

## 2017-10-02 NOTE — Patient Outreach (Signed)
[place] [facility]) Care Management  10/02/2017  ADALIAH HIEGEL 1950-08-26 [id number]   RN Health Coach Follow Up with Physician Request  Referral Date:06/09/2017 Referral Source:Transfer from [place] Reason for Referral:Continued Disease Management Education Insurance:[employer] Medicare   Outreach Attempt:  Received in basket message from [name] requesting [place] to ask patient to go to their "[facility] to be evaluated for hypoglycemic".  RN Health Coach made unsuccessful outreach to patient concerning physicians instructions, left HIPAA compliant voice message.  Plan:  RN Health Coach will monitor chart to see if patient has contacted Internal Medicine Clinic and if not will make another telephone outreach to patient in the next 3-4 business days.  [place] [phone] [email or web address]

## 2017-10-05 ENCOUNTER — Encounter: Payer: Self-pay | Admitting: *Deleted

## 2017-10-05 ENCOUNTER — Other Ambulatory Visit: Payer: Self-pay | Admitting: *Deleted

## 2017-10-05 NOTE — Patient Outreach (Signed)
[facility]) Care Management  10/05/2017  [name] [birth year] [id number]   CSW was able to make initial contact with patient today to perform phone assessment, as well as assess and assist with social work needs and services.  CSW introduced self, explained role and types of services provided through [place] [facility] ([place] [facility]).  CSW further explained to patient that CSW works with [name], also with [place] [facility], Hubert Azure. CSW then explained the reason for the call, indicating that [name] thought that patient would benefit from social work services and resources to assist with counseling and supportive services to help reduce symptoms of anxiety and depression.  CSW obtained two HIPAA compliant identifiers from patient, which included patient's name and date of birth.  Patient admits that she is definitely interested in receiving counseling and supportive services for symptoms of anxiety and depression, but reports that she will only be in town for a few weeks between now and November.  After coordinating schedules, CSW will plan to meet with patient for the initial home visit on Thursday, October 22, 2017 at 11:00AM.  In the meantime, CSW will print EMMI information for patient, pertaining specifically to "Signs and Symptoms of Depression" to review with patient during the initial home visit.  CSW noted that patient does not currently take any antidepressant or antianxiety medications, but is agreeable if her Primary Care Physician, [name] is willing to prescribe.  CSW agreed to contact [name] to discuss available options, as patient has an appointment scheduled with [name] for Friday, November 20, 2017, at the [facility] at 2:15PM.  Patient voiced understanding and was agreeable to this plan.  THN CM Care Plan Problem One     Most Recent Value  Care Plan Problem One   Patient is experiencing symptoms of anxiety and depression.  Role Documenting the Problem One  Clinical Social Worker  Care Plan for Problem One  Active  [facility] Long Term Goal   Patient will have a decrease in symptoms of anxiety and depression, through counseling and supportive services, within the next 45 days.  THN Long Term Goal Start Date  10/05/17  Interventions for Problem One Long Term Goal  CSW will provide counseling and supportive services to patient to try and help reduce patient's symptoms of anxiety and depression.  THN CM Short Term Goal #1   Patient will review EMMI information provided to her by CSW, pertaining specifically to "Sign and Symptoms of Depression" for her review with CSW, within the next three weeks.  THN CM Short Term Goal #1 Start Date  10/05/17  Interventions for Short Term Goal #1  CSW will print EMMI information for patient and provide to patient during the initial home visit.  THN CM Short Term Goal #2   Patient will meet with CSW for an initial home visit for CSW to provide counseling and supportive services for symptoms of anxiety and depression, within the next three weeks.  THN CM Short Term Goal #2 Start Date  10/05/17  Interventions for Short Term Goal #2  An initial home visit has been scheduled with patient for Thursday, October 22, 2017 at 11:00AM.  [facility] CM Short Term Goal #3  Patient will practice deep breathing exercises and relaxation techniques, taught to her by CSW, to help reduce symptoms of anxiety and depression, within the next 30 days.  THN CM Short Term Goal #3 Start Date  10/05/17  Interventions for Short Tern Goal #3  CSW will teach patient proper use of deep breathing exercises and relaxation techniques to help reduce symptoms of anxiety and depression.      Nat Christen, BSW, MSW, LCSW  Licensed Education officer, [facility]  Mailing [place] [address], [place], [place]  [address] Physical Address-[address], [place], [place] [address] Toll Free Main # [phone] Fax # [phone] Cell # [phone]  Office # [phone] [name]

## 2017-10-06 ENCOUNTER — Ambulatory Visit: Payer: Self-pay | Admitting: *Deleted

## 2017-10-08 ENCOUNTER — Other Ambulatory Visit: Payer: Self-pay | Admitting: Internal Medicine

## 2017-10-08 DIAGNOSIS — E785 Hyperlipidemia, unspecified: Secondary | ICD-10-CM

## 2017-10-14 ENCOUNTER — Other Ambulatory Visit: Payer: Self-pay | Admitting: Internal Medicine

## 2017-10-14 DIAGNOSIS — F332 Major depressive disorder, recurrent severe without psychotic features: Secondary | ICD-10-CM

## 2017-10-14 NOTE — Telephone Encounter (Signed)
Next appt scheduled 11/8 with PCP. 

## 2017-10-20 ENCOUNTER — Other Ambulatory Visit: Payer: Self-pay | Admitting: *Deleted

## 2017-10-20 ENCOUNTER — Encounter: Payer: Self-pay | Admitting: *Deleted

## 2017-10-20 NOTE — Patient Outreach (Signed)
[place] [facility]) Care Management  10/20/2017  [name] [birth year] [id number]   [place] Monthly Outreach  Referral Date:06/09/2017 Referral Source:Transfer from [place] Reason for Referral:Continued Disease Management Education Insurance:[employer] Medicare   Outreach Attempt:  Outreach attempt #1 to patient for monthly follow up. No answer. RN Health Coach left HIPAA compliant voicemail message along with contact information.  Plan:  RN Health Coach will make another outreach attempt in the month of October.  [place] [phone] [email or web address]

## 2017-10-20 NOTE — Patient Outreach (Signed)
[place] [facility]) Care Management  10/20/2017  [name] [birth year] [id number]   [place] Monthly Outreach  Referral Date:06/09/2017 Referral Source:Transfer from [place] Reason for Referral:Continued Disease Management Education Insurance:[employer] Medicare   Outreach Attempt:  Received telephone call back from patient.  HIPAA verified with patient.  Patient continues to report she is sad and depressed.  States she has spoken with [facility] Social Worker and is looking forward to her home visit this week.  Continues to states she having almost daily hypoglycemic events that she is symptomatic with.  Reporting lowest blood sugar in the 70's.  Fasting blood sugar this morning was 111.  Continues to take Lantus and Metformin and has appointment this week with primary care to discuss hypoglycemia.  Patient stating due to her depression she has had a reduced appetite and typically does not eat throughout the day.  Discussed the importance of eating balanced meals throughout the day and at least 3 meals a day with a bedtime snack if not more often.  Discussed diabetic diet with patient.  Appointments:  Patient has scheduled appointment with primary care provider on 10/23/2017.  Plan: RN Health Coach encouraged patient to participate with [facility] Social Worker to assist with depression. RN Health Coach will make next telephone outreach to patient in the month of December.  [place] Coach [phone] [email or web address]

## 2017-10-22 ENCOUNTER — Other Ambulatory Visit: Payer: Self-pay | Admitting: *Deleted

## 2017-10-22 NOTE — Patient Outreach (Signed)
[facility]) Care Management  10/22/2017  [name] Apr 23, 1950 [id number]  CSW was able to meet with patient today for the initial home visit.  Patient reported experiencing "severe depression", not wanting to leave her home, answer the phone, or even open the blinds on her windows.  Patient was tearful throughout the visit today, admitting that she feels like she is getting ready to have another "nervous breakdown".  CSW offered counseling and supportive services, as well as Cognitive Behavioral Therapy.  CSW also spoke with patient about getting re-established with her Psychiatrist, Dr. Casimiro Needle, for which CSW provided patient the contact information to schedule the appointment.   Patient admits that she has always suffered from depression and anxiety.  At a young age, patient was responsible for caring for her 5 younger brothers and sisters, which she reports as being very stressful.  Patient went on to say that she also cared for all of her siblings prior to their deaths, having to watch each of them slowly deteriorate.  Patient also cared for her mother, prior to her death, believing that she has "not been the same since".  Patient stated, "You don't realize the toll that it takes on a person to have to watch so many of their loved ones pass away".    As of late, patient reports having lost her husband, two older cousins, two very close friends and her pastor.  Patient stated, "My two older cousins were like uncles to me and helped raise me".  Patient went on to say that a part of her died when she lost her husband on June 16, 2014.  Patient admitted that she had never lived alone prior to her husband's death; therefore, she had to adjust to a new way of life.  Looking back now, patient believes that she completely shut out parts of her life from 2016 and 2017 because the grief was too much for her to bare.  Patient stated, "Grief is overwhelming to me, I just want to close  myself off from the rest of the world and not ever get close to anyone again".  Patient went on to say, "I don't want to go anywhere or do anything, nor do I wish to see anybody".  Patient then stated, "I just keep wondering why I am still here"?  Patient denied feeling homicidal or suicidal, indicating that her only remaining family members (2 Goddaughters and a biological daughter) are the only things that are keeping her here.  Patient also mentioned that she has a very strong Faith and that she believes it is wrong for someone to take their own life.   Patient reported that she relies heavily on her Bible and enjoys studying her scripture, but reports not having picked up the Bible for several weeks now.  Patient indicated that she plans to start reading her scripture again, as well as color in her coloring books.  Patient admitted that reading and coloring is a great escape for her, it relaxes her and allows her to "get out of her own head".  CSW agreed to teach patient proper use of deep breathing exercises and relaxation techniques during the next scheduled home visit.  Patient reported having a "nervous breakdown" in 2017, due to all the stress that she was under at the time, which she believes precipitated the event.  Patient was living with a family member, with whom patient did not get along, but felt trapped because she had no money and  was unable to afford her own place.  Patient stated, "I was about to end my own life, but not by my own hands".  Patient went on to say, "I had fantasies about walking into oncoming traffic, appearing in bad neighborhoods after dark, hoping I would get shot and killed, etc."  Patient reported spending a week at [facility] in March of 2017 due to depression and suicidal ideation with a plan.    Per patient, prior to the "break down", she was receiving medication management and psychotherapeutic services through Dr. Casimiro Needle.  Patient was unable to  identify why she stopped seeing Dr. Casimiro Needle, indicating that she would really like to get re-established with his practice.  Patient agreed to contact Dr. Karen Chafe office to see about scheduling an appointment.  Patient also agreed to receiving counseling and supportive services through CSW until she has her first appointment with Dr. Casimiro Needle.  Patient denied an interest in receiving Grief and Loss Counseling services through [place], not wanting to relive the trauma from all the deaths she has had to endure.  Patient reported that her insomnia has gotten really bad lately, to the point where she can go for an entire week without sleep.  Patient stated, "My subconscious mind will not shut down long enough for her to be able to relax and fall asleep".  Patient fears that she is close to another "nervous breakdown", wanting to try to do everything she can to avoid this from happening.  Patient verbalized that she does not believe that she would recover from a "second break".  CSW encouraged patient to speak with her Primary Care Physician, [name] about possibly starting her on a medication to help her sleep.  Patient indicated that she takes her prescription medications exactly as prescribed, especially the Doxepin 150 MG PO Daily, her antidepressant medication.  Patient believes that the Doxepin has been working up until now, but now that death is starting to resurface, patient fears that she may require a higher dose of the Doxepin, as well as have another antidepressant medication prescribed to help eliminate her symptoms.  Patient reported that she plans to speak with [name] about her symptoms of depression and anxiety, in hopes that they are able to find the "right remedy". When somebody dies, you lose a piece of yourself; therefore, I feel like I have nothing left.  Patient believes that people that were once her friends are now her enemies, because  they judge her and "look down upon her".  Patient stated, "They try to tell me what to do and no matter what I do, it's wrong, they don't want to accept me for who I am, or they think I'm just plain crazy".  Patient admitted that her world is filled with darkness and loneliness, but that she prefers it that way.  Patient then stated, "I just want to feel attractive needed, like I actually have a purpose in life".  Patient admits to being a "chronic smoker", desperately wanting to stop smoking but not having to ability or will-power to quit.  CSW spoke with patient about Smoking Cessation Classes offered through the [facility], but patient indicated that now is definitely not the time to try and quit.  Patient did agree to try and "cut back".  Patient indicated that she definitely does not eat like she should, not having much of an appetite.  Patient would also like to get  back on track with regards to her Diabetes, for which CSW made a referral to an [facility], also with [place] [facility] to follow patient for diease management services.  Patient uses public transportation to get to and from her physician appointments.  Patient receives Extra Help to assist with the cost of her medications.  Patient receives Physicist, medical, as well as Adult Medicaid, both through the [place], to help pay for food and medical expenses.  Patient reports being on a very fixed income, but able to "make ends meet" each month.  Patient stated that her biological daughter, Clemon Chambers' Mirna Mires is very supportive of her, and occasionally offers financial assistance when patient is in a bind.    CSW agreed to meet with patient for the next scheduled home visit on Thursday, November 05, 2017 at 9:00AM to provide counseling and supportive services.  CSW provided patient with EMMI information pertaining specifically to "Signs and Symptoms of Depression" for her independent  review.  CSW will then review the information with patient to ensure understanding.  Patient was most appreciative of the visit today, admitting that she actually felt somewhat better after having spoken with CSW.  THN CM Care Plan Problem One     Most Recent Value  Care Plan Problem One  Patient is experiencing symptoms of anxiety and depression.  Role Documenting the Problem One  Clinical Social Worker  Care Plan for Problem One  Active  [facility] Long Term Goal   Patient will have a decrease in symptoms of anxiety and depression, through counseling and supportive services, within the next 45 days.  THN Long Term Goal Start Date  10/05/17  [facility] CM Short Term Goal #1   Patient will review EMMI information provided to her by CSW, pertaining specifically to "Sign and Symptoms of Depression" for her review with CSW, within the next three weeks.  THN CM Short Term Goal #1 Start Date  10/05/17  [facility] CM Short Term Goal #2   Patient will meet with CSW for an initial home visit for CSW to provide counseling and supportive services for symptoms of anxiety and depression, within the next three weeks.  THN CM Short Term Goal #2 Start Date  10/05/17  [facility] CM Short Term Goal #2 Met Date  10/22/17  Interventions for Short Term Goal #2  Patient was able to meet with CSW today to perform the initial home visit to provide counseling and supportive services.  THN CM Short Term Goal #3  Patient will practice deep breathing exercises and relaxation techniques, taught to her by CSW, to help reduce symptoms of anxiety and depression, within the next 30 days.  THN CM Short Term Goal #3 Start Date  10/05/17     Nat Christen, BSW, MSW, [place]  Licensed Clinical Social Worker  [place]  Mailing [place]. [address] [place], [place] [address] Physical Address-[address], [place], [place] [address] Toll Free Main # [phone] Fax # [phone] Cell # [phone]   Office # [phone] [name]

## 2017-10-23 ENCOUNTER — Other Ambulatory Visit: Payer: Self-pay

## 2017-10-23 ENCOUNTER — Ambulatory Visit (INDEPENDENT_AMBULATORY_CARE_PROVIDER_SITE_OTHER): Payer: Medicare HMO | Admitting: Internal Medicine

## 2017-10-23 ENCOUNTER — Encounter: Payer: Self-pay | Admitting: Internal Medicine

## 2017-10-23 VITALS — BP 134/79 | HR 91 | Temp 98.5°F | Ht 61.0 in | Wt 210.0 lb

## 2017-10-23 DIAGNOSIS — R269 Unspecified abnormalities of gait and mobility: Secondary | ICD-10-CM

## 2017-10-23 DIAGNOSIS — E118 Type 2 diabetes mellitus with unspecified complications: Secondary | ICD-10-CM | POA: Diagnosis not present

## 2017-10-23 HISTORY — DX: Type 2 diabetes mellitus with unspecified complications: E11.8

## 2017-10-23 LAB — GLUCOSE, CAPILLARY: Glucose-Capillary: 87 mg/dL (ref 70–99)

## 2017-10-23 MED ORDER — INSULIN GLARGINE 100 UNIT/ML ~~LOC~~ SOLN: 12.0000 [IU] | Freq: Every day | SUBCUTANEOUS | 1 refills | Status: DC

## 2017-10-23 NOTE — Assessment & Plan Note (Signed)
Diabetes: Early am hypoglycemic episodes. The patient states that she has been experiencing early morning episodes of hypoglycemia that are symptomatic where she awakes from sleep sweating, felling weak and almost lightheaded. She stated that these improve with juice or similar food. She takes her serum glucose levels in the morning at 8 and again at 8 pm. They are typically near 160 +/- 20 in the evening as per her journal and 90 +/- 20 in the mornings. As her A1c has dropped significantly or recent and is well below the target of 7.0 at 6.1% and when considering the hypoglycemic episodes I feel she is receiving excess Lantus insulin as compared to her dietary intake.   Plan: We will decrease her insulin to 12U and switch to morning dosing.  She is to have a call from [facility] in one week to assess her response to this change.  We will continue the Metformin.  To return in one month with her PCP.

## 2017-10-23 NOTE — Progress Notes (Signed)
   CC: low blood sugar  HPI:[name] is a 67 y.o. female who presents for evaluation of low blood sugar in the morning. Please see individual problem based A/P for details.  Diabetes: Early am hypoglycemic episodes. The patient states that she has been experiencing early morning episodes of hypoglycemia that are symptomatic where she awakes from sleep sweating, felling weak and almost lightheaded. She stated that these improve with juice or similar food. She takes her serum glucose levels in the morning at 8 and again at 8 pm. They are typically near 160 +/- 20 in the evening as per her journal and 90 +/- 20 in the mornings. As her A1c has dropped significantly or recent and is well below the target of 7.0 at 6.1% and when considering the hypoglycemic episodes I feel she is receiving excess Lantus insulin as compared to her dietary intake.   Plan: We will decrease her insulin to 12U and switch to morning dosing.  She is to have a call from [facility] in one week to assess her response to this change.  We will continue the Metformin.  To return in one month with her PCP.   Right hip pain and gait abnormality: Patient is requesting a walker. She required a wheelchair to be transported to the clinic today. The pain is nonspecific, right sided, not improved with Celebrex. She states that this is a chronic pain. She is feeling unwell today and does not think she can walk for an exam due to pain at this time.  I have advised her that we would place a referral to PT to evaluate her gait abnormality further and that she could follow up with her PCP in November. She will need further evaluation by her PCP and PT prior to authorization for a walker to assure appropriateness and safety with a walker.     Past Medical History:  Diagnosis Date  . Allergy   . Anal fissure   . Anxiety   . Cataract   . Chronic headaches   . Depression   . Diverticulosis   . GERD (gastroesophageal reflux  disease)   . Glaucoma   . HLD (hyperlipidemia)   . Hypertension   . Hypothyroidism   . PAD (peripheral artery disease) ([place])   . PUD (peptic ulcer disease)   . TIA (transient ischemic attack)    Review of Systems:  ROS negative except as per HPI.  Physical Exam: Vitals:   10/23/17 1319  BP: 134/79  Pulse: 91  Temp: 98.5 F (36.9 C)  TempSrc: Oral  SpO2: 100%  Weight: 210 lb (95.3 kg)  Height: 5\' 1"  (1.549 m)   General: A/O x4, in no acute distress, afebrile, nondiaphoretic Psych: Patient's mood is appropriate, not depressed, affect is appropriate  Assessment & Plan:   See Encounters Tab for problem based charting.  Patient discussed with Dr. Lynnae January

## 2017-10-23 NOTE — Assessment & Plan Note (Signed)
Right hip pain and gait abnormality: Patient is requesting a walker. She required a wheelchair to be transported to the clinic today. The pain is nonspecific, right sided, not improved with Celebrex. She states that this is a chronic pain. She is feeling unwell today and does not think she can walk for an exam due to pain at this time.  I have advised her that we would place a referral to PT to evaluate her gait abnormality further and that she could follow up with her PCP in November. She will need further evaluation by her PCP and PT prior to authorization for a walker to assure appropriateness and safety with a walker.

## 2017-10-23 NOTE — Patient Instructions (Signed)
FOLLOW-UP INSTRUCTIONS When: Please call Butch Penny and report your glucose numbers in one week Please return to see your PCP in November at you scheduled appointment on the 8th.  I have decreased your insulin today to 12 units which I am requesting that you take in the morning.   As always if your symptoms worsen, fail to improve, or you develop other concerning symptoms, please notify our office or visit the local ER if we are unavailable. Symptoms including sweating, weakness, confusion, should not be ignored and should encourage you to visit the ED if we are unavailable by phone.   Thank you for your visit to the [facility] today. If you have any questions or concerns please call us at [phone].

## 2017-10-26 ENCOUNTER — Other Ambulatory Visit: Payer: Self-pay | Admitting: *Deleted

## 2017-10-26 ENCOUNTER — Encounter: Payer: Self-pay | Admitting: *Deleted

## 2017-10-26 NOTE — Patient Outreach (Signed)
[facility]) Care Management  10/26/2017  [name] 16-Jan-1950 [id number]   New referral received to contact member for disease management of diabetes.  She is already active with health coach.  Collaborated with health coach, F. Tarpley, to discuss member's needs.  She was seen by primary MD last week, insulin adjusted.  Contacted by Ms. Tarpley today, will continue to be followed by health coach at this time.  Will not open to complex case management at this time.    Valente David, [place], MSN [place] [phone]

## 2017-10-26 NOTE — Progress Notes (Signed)
Internal Medicine Clinic Attending  Case discussed with Dr. Harbrecht at the time of the visit.  We reviewed the resident's history and exam and pertinent patient test results.  I agree with the assessment, diagnosis, and plan of care documented in the resident's note.   

## 2017-10-26 NOTE — Patient Outreach (Signed)
[facility]) Care Management  10/26/2017  [name] [birth year] [id number]   [place]  Appointment Outreach  Referral Date:06/09/2017 Referral Source:Transfer from [facility] Nurse Reason for Referral:Continued Disease Management Education Insurance:[employer]   Outreach Attempt:  Successful telephone outreach to patient for hypoglycemia and post physician appointment.  HIPAA verified with patient.  Patient stating she has not had any hypoglycemic events since MD appointment on Friday.  Reports her insulin dose was decreased to 12 units to be taking in the mornings with breakfast.  Endorses complying with this dose since Saturday.  Fasting blood sugar this morning was 128.  Blood sugar last night was 198 (2 hours after dinner and about 30 minutes after bedtime snack).  Reports speaking with [facility] Social Worker has really helped her depression and she plans to make appointment with psychiatrist soon.  Discussed [place] Nurse home visit with patient, patient states since her appointment with her physician she feels her diabetes is better controlled and does not think she needs home visit.  Reports physician office assisting her with setting her CBG meter and it is working properly.Verbally agrees for continued Disease Management Calls for now and will make [place] aware in the future is she feels she would benefit from home visit.  Also discussed leg pain and orders for therapy.  Encouraged patient to find out from PCP office if therapy is going to be home health versus outpatient.  Patient stating she "hopes for home health to assess in home safety evaluation and need for walker"; and states she will clarify with physician office.  Appointments:  Attended appointment with primary care provider's office on 10/23/2017 and has scheduled follow up on 11/20/2017.  Plan:  RN Health Coach will make another telephone outreach to  patient in the month of November.   [name] [phone] [name]

## 2017-11-03 ENCOUNTER — Other Ambulatory Visit: Payer: Self-pay | Admitting: *Deleted

## 2017-11-03 NOTE — Patient Outreach (Signed)
[facility]) Care Management  11/03/2017  [name] Apr 16, 1950 [id number]   CSW received a voicemail message from patient last evening indicating that she needed to cancel her home visit with CSW, originally scheduled for Thursday, November 05, 2017 at 9:00 AM.  Patient reported that she plans to go out of town and agreed to contact CSW when she returns to reschedule the visit.  Patient did not indicate in her message how long she plans to be gone; therefore, CSW will make arrangements to contact patient in two weeks, if a return call is not received from patient in the meantime. Nat Christen, BSW, MSW, LCSW  Licensed Education officer, [facility]  Mailing [place] [address], [place], [place] [address] Physical Address-[address], [place], [place] [address] Toll Free Main # [phone] Fax # [phone] Cell # [phone]  Office # [phone] [name]

## 2017-11-04 ENCOUNTER — Telehealth: Payer: Self-pay | Admitting: Dietician

## 2017-11-04 NOTE — Telephone Encounter (Signed)
Still low 80-70 in morning. At night in 100s last night 134, took meds and insulin. Moving insulin dose around from morning time, lunch and sometimes dinnertime based on how high her blood sugar is. 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 Congratulated her on this. P:Advised her to decrease her insulin dose to 10 units lantus tomorrow morning (skip it today) to encourage her to not be afraid to take it the same time every day. Call office if any questions or blood sugars < 70. Told her I am sending this note to her doctor and if any other changes I will call her back.  Debera Lat, RD 11/04/2017 5:10 PM.

## 2017-11-05 ENCOUNTER — Ambulatory Visit: Payer: Self-pay | Admitting: *Deleted

## 2017-11-05 ENCOUNTER — Other Ambulatory Visit: Payer: Self-pay | Admitting: Internal Medicine

## 2017-11-05 DIAGNOSIS — E669 Obesity, unspecified: Principal | ICD-10-CM

## 2017-11-05 DIAGNOSIS — E1169 Type 2 diabetes mellitus with other specified complication: Secondary | ICD-10-CM

## 2017-11-05 NOTE — Telephone Encounter (Signed)
Thank you Butch Penny. I agree with the dose change. She may need to stop Lantus altogether if this continues. However, I think her PCP would be better able to discuss that with her than I would. I will forward this to [name] to further monitor.   Thank you again.  Kathi Ludwig, MD

## 2017-11-05 NOTE — Telephone Encounter (Signed)
Change is appropriate. Will re-evaluate blood sugar levels and need for insulin at appt on 11/8. Thank you!

## 2017-11-07 ENCOUNTER — Other Ambulatory Visit: Payer: Self-pay | Admitting: Internal Medicine

## 2017-11-13 DIAGNOSIS — H2511 Age-related nuclear cataract, right eye: Secondary | ICD-10-CM | POA: Diagnosis not present

## 2017-11-13 DIAGNOSIS — I10 Essential (primary) hypertension: Secondary | ICD-10-CM | POA: Diagnosis not present

## 2017-11-13 LAB — HM DIABETES EYE EXAM

## 2017-11-17 ENCOUNTER — Other Ambulatory Visit: Payer: Self-pay | Admitting: *Deleted

## 2017-11-17 ENCOUNTER — Encounter: Payer: Self-pay | Admitting: *Deleted

## 2017-11-17 NOTE — Patient Outreach (Signed)
[facility]) Care Management  11/17/2017  [name] 16-Jun-1950 [id number]   CSW was able to make contact with patient today to follow-up regarding social work services and resources, as well as to see if patient is interested in rescheduling the routine home visit.  Patient admits that she is "doing much better, is in a much better state, feeling less depressed and does not feel the need to continue to receive counseling and supportive services through [place] with [place] [facility]".  Patient indicated that she has been spending a lot of time out-of-town, with family, and plans to leave again at the end of this week for another month.  Patient reported that she has reviewed EMMI information provided to her by CSW, pertaining specifically to "Signs and Symptoms of Depression".  Patient believes she has a better understanding of why she is experiencing symptoms of depression and is learning how to better cope with these symptoms.  Patient indicated that she may be interested in counseling services again in the near future, but wishes to terminate services with CSW, at this time.  Patient agreed to continue to try and schedule an appointment with Dr. Norma Fredrickson, Psychiatrist with [place], for medication management and psychotherapeutic services.  CSW will perform a case closure on patient, as all goals of treatment have been met from social work standpoint and no additional social work needs have been identified at this time.  CSW will notify patient's RNCM with [place] [facility], Hubert Azure of CSW's plans to close patient's case.  CSW will fax an update to patient's Primary Care Physician, [name] to ensure that they are aware of CSW's involvement with patient's plan of care.    THN CM Care Plan Problem One     Most Recent Value  Care Plan Problem One  Patient is experiencing symptoms of anxiety and  depression.  Role Documenting the Problem One  Clinical Social Worker  Care Plan for Problem One  Active  [facility] Long Term Goal   Patient will have a decrease in symptoms of anxiety and depression, through counseling and supportive services, within the next 45 days.  THN Long Term Goal Start Date  10/05/17  [facility] CM Short Term Goal #1   Patient will review EMMI information provided to her by CSW, pertaining specifically to "Sign and Symptoms of Depression" for her review with CSW, within the next three weeks.  THN CM Short Term Goal #1 Start Date  10/05/17  [facility] CM Short Term Goal #1 Met Date  11/17/17  Interventions for Short Term Goal #1  Patient admits to reviewing EMMI information to better educate herself on the Signs and Symptoms of Depression.  THN CM Short Term Goal #2   Patient will meet with CSW for an initial home visit for CSW to provide counseling and supportive services for symptoms of anxiety and depression, within the next three weeks.  THN CM Short Term Goal #2 Start Date  10/05/17  [facility] CM Short Term Goal #2 Met Date  10/22/17  THN CM Short Term Goal #3  Patient will practice deep breathing exercises and relaxation techniques, taught to her by CSW, to help reduce symptoms of anxiety and depression, within the next 30 days.  THN CM Short Term Goal #3 Start Date  10/05/17     Nat Christen, BSW, MSW, [place]  Licensed Clinical Social Worker  [place]  Mailing Address-[address] [place], [place] [address] Physical Address-[address], [place], [place] [address] Toll Free Main # [phone] Fax # [phone] Cell # [phone]  Office # [phone] [name]

## 2017-11-18 NOTE — Progress Notes (Deleted)
   CC: ***  HPI:  [name] is a 67 y.o. with diabetes mellitus type 2, hyprtension, pad, MDD who presents for diabetes follow up. Please see problem based charting for evaluation, assessment, and plan.  DM   Past Medical History:  Diagnosis Date  . Allergy   . Anal fissure   . Anxiety   . Cataract   . Chronic headaches   . Depression   . Diverticulosis   . GERD (gastroesophageal reflux disease)   . Glaucoma   . HLD (hyperlipidemia)   . Hypertension   . Hypothyroidism   . PAD (peripheral artery disease) ([place])   . PUD (peptic ulcer disease)   . TIA (transient ischemic attack)    Review of Systems:  ***  Physical Exam:  There were no vitals filed for this visit. ***  Assessment & Plan:   See Encounters Tab for problem based charting.  Patient {GC/GE:[id number]::"discussed with","seen with"} Dr. {NAMES:[id number]::"[name]","[name]","E. Harvey","[name]","[name]","[name]","[name]","[name]"}

## 2017-11-20 ENCOUNTER — Encounter: Payer: Self-pay | Admitting: Internal Medicine

## 2017-11-23 ENCOUNTER — Other Ambulatory Visit: Payer: Self-pay | Admitting: *Deleted

## 2017-11-23 ENCOUNTER — Other Ambulatory Visit: Payer: Self-pay | Admitting: Internal Medicine

## 2017-11-23 DIAGNOSIS — K219 Gastro-esophageal reflux disease without esophagitis: Secondary | ICD-10-CM

## 2017-11-23 NOTE — Patient Outreach (Signed)
[facility]) Care Management  11/23/2017  LULLA LINVILLE April 02, 1950 [id number]   [place] Monthly Outreach  Referral Date:06/09/2017 Referral Source:Transfer from [place] Reason for Referral:Continued Disease Management Education Insurance:[employer]   Outreach Attempt:  Outreach attempt #1 to patient for monthly follow up. No answer. RN Health Coach left HIPAA compliant voicemail message along with contact information.  Plan:  RN Health Coach will make another telephone outreach to patient within the month of November.  [place] [phone] [email or web address]

## 2017-12-01 ENCOUNTER — Encounter: Payer: Self-pay | Admitting: *Deleted

## 2017-12-01 ENCOUNTER — Other Ambulatory Visit: Payer: Self-pay | Admitting: *Deleted

## 2017-12-01 NOTE — Patient Outreach (Signed)
[facility]) Care Management  12/01/2017  ARNOLD DEPINTO 23-Dec-1950 [id number]   RN Health CoachMonthly Outreach  Referral Date:06/09/2017 Referral Source:Transfer from [place] Reason for Referral:Continued Disease Management Education Insurance:[employer]   Outreach Attempt:  Received telephone call back from patient.  HIPAA verified with patient.  Patient reporting she is still out of town and will return the beginning of December for her medical appointments.  After appointments she plans to go back out of town with her family.  States for her depression the holidays are the hardest and it helps being with her family.  Patient continues to report hypoglycemic events.  Fasting blood sugar this morning was 109 with fasting ranges 70-90's.  Patient stating she is having hard time taking her Lantus at the same times in the mornings due to waking up at different times, not being a morning person and having to take certain medications on an empty stomach.  Encouraged patient to contact physician and discuss dose and timing of insulin.  She continues to attempt to schedule appointment with her psychiatrist.  Patient also reporting at her recent eye examination being diagnosed with right eye cataract with plans to have it removed in December.  Appointments:  Appointment with primary care provider for November cancelled due to patient being out of town.  Rescheduled appointment is 12/25/2017.  Has scheduled appointment with Vascular, Dr. Donzetta Matters on 01/01/2018.  Plan: RN Health Coach will make next telephone outreach to patient within the month of December.  [place] [phone] [email or web address]

## 2017-12-02 ENCOUNTER — Other Ambulatory Visit: Payer: Self-pay | Admitting: Internal Medicine

## 2017-12-02 DIAGNOSIS — F332 Major depressive disorder, recurrent severe without psychotic features: Secondary | ICD-10-CM

## 2017-12-02 NOTE — Telephone Encounter (Signed)
Next appt scheduled 12/13 with PCP. 

## 2017-12-03 ENCOUNTER — Other Ambulatory Visit: Payer: Self-pay | Admitting: Internal Medicine

## 2017-12-03 DIAGNOSIS — E039 Hypothyroidism, unspecified: Secondary | ICD-10-CM

## 2017-12-03 DIAGNOSIS — M5432 Sciatica, left side: Principal | ICD-10-CM

## 2017-12-03 DIAGNOSIS — M5431 Sciatica, right side: Secondary | ICD-10-CM

## 2017-12-08 ENCOUNTER — Encounter: Payer: Self-pay | Admitting: *Deleted

## 2017-12-09 ENCOUNTER — Ambulatory Visit: Payer: Self-pay | Admitting: *Deleted

## 2017-12-17 ENCOUNTER — Telehealth: Payer: Self-pay | Admitting: Dietician

## 2017-12-17 NOTE — Telephone Encounter (Signed)
Opened in error

## 2017-12-18 ENCOUNTER — Other Ambulatory Visit: Payer: Self-pay | Admitting: Dietician

## 2017-12-18 ENCOUNTER — Telehealth: Payer: Self-pay | Admitting: Dietician

## 2017-12-18 DIAGNOSIS — E1151 Type 2 diabetes mellitus with diabetic peripheral angiopathy without gangrene: Secondary | ICD-10-CM

## 2017-12-18 NOTE — Progress Notes (Signed)
Referral for CGM okay per [name].

## 2017-12-18 NOTE — Telephone Encounter (Signed)
[name] agrees to having Continuous glucose monitoring sensor placed at her visit with [name] next Friday.

## 2017-12-23 ENCOUNTER — Ambulatory Visit: Payer: Self-pay | Admitting: *Deleted

## 2017-12-25 ENCOUNTER — Encounter: Payer: Self-pay | Admitting: Internal Medicine

## 2017-12-25 ENCOUNTER — Other Ambulatory Visit: Payer: Self-pay

## 2017-12-25 ENCOUNTER — Ambulatory Visit: Payer: Self-pay | Admitting: Vascular Surgery

## 2017-12-25 ENCOUNTER — Ambulatory Visit (INDEPENDENT_AMBULATORY_CARE_PROVIDER_SITE_OTHER): Payer: Medicare HMO | Admitting: Internal Medicine

## 2017-12-25 ENCOUNTER — Ambulatory Visit: Payer: Medicare HMO | Admitting: Pharmacist

## 2017-12-25 ENCOUNTER — Encounter (HOSPITAL_COMMUNITY): Payer: Self-pay

## 2017-12-25 ENCOUNTER — Other Ambulatory Visit: Payer: Self-pay | Admitting: Internal Medicine

## 2017-12-25 VITALS — BP 155/92 | HR 88 | Temp 97.9°F | Ht 61.0 in | Wt 211.4 lb

## 2017-12-25 DIAGNOSIS — Z79899 Other long term (current) drug therapy: Secondary | ICD-10-CM | POA: Diagnosis not present

## 2017-12-25 DIAGNOSIS — Z0000 Encounter for general adult medical examination without abnormal findings: Secondary | ICD-10-CM

## 2017-12-25 DIAGNOSIS — M79601 Pain in right arm: Secondary | ICD-10-CM

## 2017-12-25 DIAGNOSIS — Z720 Tobacco use: Secondary | ICD-10-CM | POA: Diagnosis not present

## 2017-12-25 DIAGNOSIS — F329 Major depressive disorder, single episode, unspecified: Secondary | ICD-10-CM

## 2017-12-25 DIAGNOSIS — E1151 Type 2 diabetes mellitus with diabetic peripheral angiopathy without gangrene: Secondary | ICD-10-CM

## 2017-12-25 DIAGNOSIS — I10 Essential (primary) hypertension: Secondary | ICD-10-CM | POA: Diagnosis not present

## 2017-12-25 DIAGNOSIS — M545 Low back pain, unspecified: Secondary | ICD-10-CM

## 2017-12-25 DIAGNOSIS — Z794 Long term (current) use of insulin: Secondary | ICD-10-CM

## 2017-12-25 DIAGNOSIS — E118 Type 2 diabetes mellitus with unspecified complications: Secondary | ICD-10-CM

## 2017-12-25 LAB — POCT GLYCOSYLATED HEMOGLOBIN (HGB A1C): HEMOGLOBIN A1C: 6.3 % — AB (ref 4.0–5.6)

## 2017-12-25 LAB — GLUCOSE, CAPILLARY: Glucose-Capillary: 75 mg/dL (ref 70–99)

## 2017-12-25 MED ORDER — INSULIN GLARGINE 100 UNIT/ML ~~LOC~~ SOLN: 10.0000 [IU] | Freq: Every day | SUBCUTANEOUS | 1 refills | Status: DC

## 2017-12-25 MED ORDER — CELECOXIB 100 MG PO CAPS: 100.0000 mg | ORAL_CAPSULE | Freq: Every day | ORAL | 0 refills | Status: DC

## 2017-12-25 NOTE — Progress Notes (Signed)
S: [name] is a 67 y.o. female reports to clinical pharmacist appointment for CGM placement  Allergies  Allergen Reactions  . Gadolinium Derivatives Anaphylaxis and Other (See Comments)    Chest tightness  . Asa [Aspirin] Hives and Nausea And Vomiting  . Crestor [Rosuvastatin Calcium] Hives and Itching  . Penicillins Hives and Itching    Has patient had a PCN reaction causing immediate rash, facial/tongue/throat swelling, SOB or lightheadedness with hypotension: Yes Has patient had a PCN reaction causing severe rash involving mucus membranes or skin necrosis: Yes Has patient had a PCN reaction that required hospitalization No Has patient had a PCN reaction occurring within the last 10 years: No If all of the above answers are "NO", then may proceed with Cephalosporin use.    Medication Sig  ACCU-CHEK AVIVA PLUS test strip USE 3 TIMES DAILY TO CHECK BLOOD SUGAR.   ACCU-CHEK SOFTCLIX LANCETS lancets Use to check blood sugar up to 4 times daily. diag code E11.51. Insulin dependent  acetaminophen (TYLENOL) 500 MG tablet Take 2 tablets (1,000 mg total) by mouth every 8 (eight) hours as needed for moderate pain.  Alcohol Swabs (B-D SINGLE USE SWABS REGULAR) PADS USE AS NEEDED AS DIRECTED  amLODipine (NORVASC) 10 MG tablet Take 1 tablet (10 mg total) by mouth daily.  atorvastatin (LIPITOR) 40 MG tablet Take 1 tablet (40 mg total) by mouth daily.  Blood Glucose Calibration (ACCU-CHEK AVIVA) SOLN   celecoxib (CELEBREX) 100 MG capsule Take 100 mg by mouth daily.  Cholecalciferol (VITAMIN D3) 400 units CAPS Take 400 Units by mouth daily. Patient not taking: Reported on 10/01/2017  doxepin (SINEQUAN) 150 MG capsule TAKE 1 CAPSULE  AT BEDTIME.  gabapentin (NEURONTIN) 400 MG capsule TAKE 1 CAPSULE EVERY MORNING, 1 CAPSULE AT LUNCH, AND 2 CAPSULES AT BEDTIME  insulin glargine (LANTUS) 100 UNIT/ML injection Inject 0.12 mLs (12 Units total) into the skin daily before breakfast.  Insulin  Syringe-Needle U-100 31G X 15/64" 0.3 ML MISC Use to inject insulin daily  levothyroxine (SYNTHROID, LEVOTHROID) 112 MCG tablet TAKE 1 TABLET ONE TIME DAILY BEFORE BREAKFAST  lisinopril-hydrochlorothiazide (PRINZIDE,ZESTORETIC) 20-25 MG tablet Take 1 tablet by mouth daily.  metFORMIN (GLUCOPHAGE-XR) 500 MG 24 hr tablet TAKE 1 TABLET TWICE DAILY BEFORE LUNCH AND SUPPER  OVER THE COUNTER MEDICATION Take 500 mg by mouth daily. Potassium 500mg   pantoprazole (PROTONIX) 40 MG tablet TAKE 1 TABLET TWICE DAILY  Polyethyl Glycol-Propyl Glycol (SYSTANE OP) Place 1 drop into both eyes daily.  varenicline (CHANTIX STARTING MONTH PAK) 0.5 MG X 11 & 1 MG X 42 tablet Take one 0.5 mg tablet by mouth once daily for 3 days, then increase to one 0.5 mg tablet twice daily for 4 days, then increase to one 1 mg tablet twice daily. Patient not taking: Reported on 10/01/2017   Past Medical History:  Diagnosis Date  . Allergy   . Anal fissure   . Anxiety   . Cataract   . Chronic headaches   . Depression   . Diverticulosis   . GERD (gastroesophageal reflux disease)   . Glaucoma   . HLD (hyperlipidemia)   . Hypertension   . Hypothyroidism   . PAD (peripheral artery disease) ([place])   . PUD (peptic ulcer disease)   . TIA (transient ischemic attack)    Social History   Socioeconomic History  . Marital status: Widowed    Spouse name: Not on file  . Number of children: 1  . Years of education: 22  .  Highest education level: Not on file  Occupational History  . Occupation: Disabled   Social Needs  . Financial resource strain: Not on file  . Food insecurity:    Worry: Not on file    Inability: Not on file  . Transportation needs:    Medical: Not on file    Non-medical: Not on file  Tobacco Use  . Smoking status: Current Some Day Smoker    Packs/day: 0.10    Years: 0.50    Pack years: 0.05    Types: Cigarettes  . Smokeless tobacco: Never Used  . Tobacco comment: 1 pack per week  days cutting back    Substance and Sexual Activity  . Alcohol use: No    Alcohol/week: 0.0 standard drinks  . Drug use: Yes    Types: Cocaine, Marijuana    Comment: marijuana: 2-3 times per month  . Sexual activity: Never    Birth control/protection: None  Lifestyle  . Physical activity:    Days per week: Not on file    Minutes per session: Not on file  . Stress: Not on file  Relationships  . Social connections:    Talks on phone: Not on file    Gets together: Not on file    Attends religious service: Not on file    Active member of club or organization: Not on file    Attends meetings of clubs or organizations: Not on file    Relationship status: Not on file  Other Topics Concern  . Not on file  Social History Narrative   Live at home alone.   Caffeine use: Drinks coffee once/day   Soda- 2-3 times per day   Family History  Problem Relation Age of Onset  . Peripheral vascular disease Mother   . Heart disease Mother   . Hypertension Mother   . AAA (abdominal aortic aneurysm) Father   . Hypertension Father   . Aneurysm Father   . Uterine cancer Sister        mets to brain  . Liver cancer Brother   . Stomach cancer Maternal Aunt        x2  . Stomach cancer Paternal Aunt   . Heart disease Brother   . Kidney cancer Brother   . Anuerysm Brother        brain  . HIV Brother        x2   O:    Component Value Date/Time   CHOL 232 (H) 05/15/2017 1606   HDL 34 (L) 05/15/2017 1606   LDLDIRECT 140 (H) 12/20/2012 1019   LDLCALC 124 (H) 05/15/2017 1606   TRIG 369 (H) 05/15/2017 1606   GLUCOSE 89 06/03/2017 0902   HGBA1C 6.1 (A) 08/21/2017 1452   NA 144 06/03/2017 0902   NA 144 03/10/2017 1009   K 3.2 (L) 06/03/2017 0902   CL 101 06/03/2017 0902   CO2 20 03/10/2017 1009   BUN 7 06/03/2017 0902   BUN 4 (L) 03/10/2017 1009   CREATININE 1.00 06/03/2017 0902   CREATININE 1.31 (H) 04/19/2014 1500   CALCIUM 8.7 03/10/2017 1009   CALCIUM 10.2 11/16/2009 2219   GFRNONAA 58 (L) 03/10/2017  1009   GFRNONAA 43 (L) 04/19/2014 1500   GFRAA 67 03/10/2017 1009   GFRAA 49 (L) 04/19/2014 1500   AST 29 02/12/2015 1745   ALT 16 02/12/2015 1745   WBC 4.7 08/25/2016 1154   HGB 12.9 06/03/2017 0902   HGB 12.3 07/26/2015 1506   HCT 38.0  06/03/2017 0902   HCT 38.1 07/26/2015 1506   PLT 108 (L) 08/25/2016 1154   PLT 204 07/26/2015 1506   TSH 3.030 12/12/2016 1622   Ht Readings from Last 2 Encounters:  12/25/17 5\' 1"  (1.549 m)  10/23/17 5\' 1"  (1.549 m)   Wt Readings from Last 2 Encounters:  12/25/17 211 lb 6.4 oz (95.9 kg)  10/23/17 210 lb (95.3 kg)   There is no height or weight on file to calculate BMI. BP Readings from Last 3 Encounters:  12/25/17 (!) 159/92  10/23/17 134/79  08/21/17 (!) 141/70   A/P: Co-visit with [name]. See Dr. Thea Gist note for details.

## 2017-12-25 NOTE — Patient Instructions (Addendum)
It was a pleasure to see you today [name]. I am sorry to hear about your right arm and lower back pain. Your right elbow pain is likely due to an inflammation of your tendon. Your back pain is likely from arthritis. I have prescribed celebrex for you to take once daily for the pain.   If you have any questions or concerns, please call our clinic at [phone] between 9am-5pm and after hours call [phone] and ask for the internal medicine resident on call. If you feel you are having a medical emergency please call 911.   Thank you, we look forward to help you remain healthy!  Lars Mage, MD Internal Medicine PGY2  Please return in 6 days with [facility] & Dr Maudie Mercury [facility].

## 2017-12-25 NOTE — Progress Notes (Addendum)
   CC: Hypertension follow up  HPI:  [name] is a 67 y.o. chronic hypertension, DM type 2, PAD, MDD, tobacco use disorder who presents for hypertension follow-up. Please see problem based charting for evaluation, assessment, and plan.  Past Medical History:  Diagnosis Date  . Allergy   . Anal fissure   . Anxiety   . Cataract   . Chronic headaches   . Depression   . Diverticulosis   . GERD (gastroesophageal reflux disease)   . Glaucoma   . HLD (hyperlipidemia)   . Hypertension   . Hypothyroidism   . PAD (peripheral artery disease) ([place])   . PUD (peptic ulcer disease)   . TIA (transient ischemic attack)    Review of Systems:    Review of Systems  Constitutional: Negative for fever.  Respiratory: Negative for cough.   Gastrointestinal: Negative for nausea and vomiting.  Neurological: Negative for dizziness.  Psychiatric/Behavioral: Negative for depression.   Physical Exam:  Vitals:   12/25/17 1522 12/25/17 1616  BP: (!) 159/92 (!) 155/92  Pulse: 98 88  Temp: 97.9 F (36.6 C)   TempSrc: Oral   SpO2: 100%   Weight: 211 lb 6.4 oz (95.9 kg)   Height: 5\' 1"  (1.549 m)    Physical Exam  Constitutional: Appears well-developed and well-nourished. No distress.  HENT:  Head: Normocephalic and atraumatic.  Eyes: Conjunctivae are normal.  Cardiovascular: Normal rate, regular rhythm and normal heart sounds.  Respiratory: Effort normal and breath sounds normal. No respiratory distress. No wheezes.  GI: Soft. Bowel sounds are normal. No distension. There is no tenderness.  Musculoskeletal: No edema. Tenderness on palpation of lower back and posterior right elbow. Neurological: Is alert.  Skin: Not diaphoretic. No erythema.  Psychiatric: Normal mood and affect. Behavior is normal. Judgment and thought content normal.    Assessment & Plan:   See Encounters Tab for problem based charting.  Patient discussed with Dr. Beryle Beams   Medicine attending: Medical  history, presenting problems, physical findings, and medications, reviewed with resident physician Dr Lars Mage on the day of the patient visit and I concur with her evaluation and management plan.

## 2017-12-26 LAB — CMP14 + ANION GAP
ALT: 9 IU/L (ref 0–32)
AST: 11 IU/L (ref 0–40)
Albumin/Globulin Ratio: 1.7 (ref 1.2–2.2)
Albumin: 4.3 g/dL (ref 3.6–4.8)
Alkaline Phosphatase: 127 IU/L — ABNORMAL HIGH (ref 39–117)
Anion Gap: 18 mmol/L (ref 10.0–18.0)
BUN/Creatinine Ratio: 8 — ABNORMAL LOW (ref 12–28)
BUN: 10 mg/dL (ref 8–27)
CALCIUM: 9.1 mg/dL (ref 8.7–10.3)
CHLORIDE: 101 mmol/L (ref 96–106)
CO2: 24 mmol/L (ref 20–29)
Creatinine, Ser: 1.21 mg/dL — ABNORMAL HIGH (ref 0.57–1.00)
GFR calc non Af Amer: 46 mL/min/{1.73_m2} — ABNORMAL LOW (ref >59)
GFR, EST AFRICAN AMERICAN: 53 mL/min/{1.73_m2} — AB (ref >59)
GLUCOSE: 83 mg/dL (ref 65–99)
Globulin, Total: 2.6 g/dL (ref 1.5–4.5)
Potassium: 4.3 mmol/L (ref 3.5–5.2)
Sodium: 143 mmol/L (ref 134–144)
TOTAL PROTEIN: 6.9 g/dL (ref 6.0–8.5)

## 2017-12-26 LAB — HEPATITIS C ANTIBODY: Hep C Virus Ab: <0.1 s/co ratio (ref 0.0–0.9)

## 2017-12-27 DIAGNOSIS — M545 Low back pain, unspecified: Secondary | ICD-10-CM | POA: Insufficient documentation

## 2017-12-27 DIAGNOSIS — M79601 Pain in right arm: Secondary | ICD-10-CM | POA: Insufficient documentation

## 2017-12-27 NOTE — Assessment & Plan Note (Signed)
The patient's blood pressure during this visit was 159/92. The patient is currently taking amlodipine 10 mg daily, lisinopril-hydrochlorothiazide 20-25 mg daily. His last blood pressure visits are  BP Readings from Last 3 Encounters:  12/25/17 (!) 155/92  10/23/17 134/79  08/21/17 (!) 141/70   Assessment and Plan The patient's hypertension was elevated during this visit. Her blood pressure is thought to be an isolated elevated reading due to pain. Will monitor patient's blood pressure closely without making any significant changes during this visit.

## 2017-12-27 NOTE — Assessment & Plan Note (Signed)
The patient has pain in her elbow and radiates down forearm. She describes the pain to be 10/10 intensity, pressure like/shooting in nature, constatl present, and lasting for the past week. She denies numbness or tingling in the right arm. The pain worsens when she puts on her coat or jacket. She has not been able to hold onto things and has been dropping things  Assessment and plan  There is no swelling, erythema, or fluid palpable in the elbow. The pain is most likely due to tendonitis. Patient was instructed to take celecoxib that will help both her back pain and right arm pain.

## 2017-12-27 NOTE — Assessment & Plan Note (Signed)
The patient has been having lower back pain intermittently for the past 2 weeks. The pain comes on when she sits down,  stands too long, or bends over. She denies any urinary or fecal incontinence, anal neuropathy   Lumbar spine xray done on 10/31/2014 was negative for any fractures. There is some grade 1 anterolithesis l4-l5 advanced facet disease.   Assessment and plan  The patient's lower back pain is likely due to degenerative disc disease. Started patient on celecoxib which she felt was beneficial in the past.

## 2017-12-27 NOTE — Assessment & Plan Note (Signed)
The patient's last a1c= 6.1 on August 2018 and during this visit was 6.3 during this visit. The patient does not note episodes of hypoglycemia.   The patient is currently taking Lantus 10 units daily, metformin 500 mg daily. The patient is compliant with medication. No significant weight changes.   Assessment and plan Patient had CGM placed during this visit. Will monitor readings and adjust lantus dose accordingly. If patient had low blood glucose readings then will decrease lantus dose.

## 2017-12-31 ENCOUNTER — Other Ambulatory Visit: Payer: Self-pay

## 2017-12-31 ENCOUNTER — Ambulatory Visit (INDEPENDENT_AMBULATORY_CARE_PROVIDER_SITE_OTHER): Payer: Medicare HMO | Admitting: Internal Medicine

## 2017-12-31 ENCOUNTER — Ambulatory Visit: Payer: Medicare HMO | Admitting: Pharmacist

## 2017-12-31 VITALS — BP 141/77 | HR 84 | Temp 97.5°F | Ht 61.0 in | Wt 213.5 lb

## 2017-12-31 DIAGNOSIS — E1151 Type 2 diabetes mellitus with diabetic peripheral angiopathy without gangrene: Secondary | ICD-10-CM | POA: Diagnosis not present

## 2017-12-31 DIAGNOSIS — Z794 Long term (current) use of insulin: Secondary | ICD-10-CM | POA: Diagnosis not present

## 2017-12-31 DIAGNOSIS — Z9181 History of falling: Secondary | ICD-10-CM

## 2017-12-31 DIAGNOSIS — M545 Low back pain: Secondary | ICD-10-CM

## 2017-12-31 MED ORDER — SITAGLIPTIN PHOSPHATE 100 MG PO TABS: 100.0000 mg | ORAL_TABLET | Freq: Every day | ORAL | 2 refills | Status: DC

## 2017-12-31 MED ORDER — INSULIN GLARGINE 100 UNIT/ML ~~LOC~~ SOLN: 4.0000 [IU] | Freq: Every day | SUBCUTANEOUS | 1 refills | Status: DC

## 2017-12-31 MED ORDER — CELECOXIB 100 MG PO CAPS: 100.0000 mg | ORAL_CAPSULE | Freq: Two times a day (BID) | ORAL | 0 refills | Status: AC

## 2017-12-31 NOTE — Progress Notes (Signed)
I saw and evaluated the patient. I personally confirmed the key portions of Dr. Olevia Perches history and exam and reviewed the continuous glucose monitor download with her.  The assessment, diagnosis, and plan were formulated together and I agree with the documentation in the resident's note.  To clarify, the patient was instructed to lower the Lantus to 4 units daily until the [place] arrives, at which point she will start the Januvia and discontinue the Lantus.

## 2017-12-31 NOTE — Progress Notes (Signed)
   CC: CGM download #1  HPI:  [name] is a 67 y.o. with a PMHx listed below presenting for CGM download #1. For details of today's visit and the status of his chronic medical issues please refer to the assessment and plan.   Past Medical History:  Diagnosis Date  . Allergy   . Anal fissure   . Anxiety   . Cataract   . Chronic headaches   . Depression   . Diverticulosis   . GERD (gastroesophageal reflux disease)   . Glaucoma   . HLD (hyperlipidemia)   . Hypertension   . Hypothyroidism   . PAD (peripheral artery disease) ([place])   . PUD (peptic ulcer disease)   . TIA (transient ischemic attack)    Review of Systems:   Review of Systems  Eyes: Negative for blurred vision and double vision.  Musculoskeletal: Positive for back pain, falls and myalgias.  Neurological: Negative for dizziness, tingling, focal weakness, weakness and headaches.     Physical Exam:  Vitals:   12/31/17 1318  BP: (!) 141/77  Pulse: 84  Temp: (!) 97.5 F (36.4 C)  SpO2: 100%  Weight: 213 lb 8 oz (96.8 kg)   Physical Exam  Constitutional: She is oriented to person, place, and time and well-developed, well-nourished, and in no distress.  Cardiovascular: Normal rate, regular rhythm and normal heart sounds.  No murmur heard. Pulmonary/Chest: Effort normal and breath sounds normal. No respiratory distress. She has no wheezes.  Abdominal: Soft. Bowel sounds are normal. She exhibits no distension. There is no abdominal tenderness.  Musculoskeletal:        General: Tenderness present. No deformity or edema.     Comments: Left lower back tender to palpation s/p fall  Neurological: She is alert and oriented to person, place, and time.  Skin: Skin is warm and dry.  Psychiatric: Mood, memory, affect and judgment normal.    Assessment & Plan:   See Encounters Tab for problem based charting.  Patient seen with Dr. Eppie Gibson

## 2017-12-31 NOTE — Assessment & Plan Note (Addendum)
[name] wore the CGM for 7 days. The average reading was 93, % time in target was 83, % time below target was 16, and % time above target was 1%. Intervention will be to decrease lantus to 4 units and start Januvia 100 mg daily. Once patient receives the new medication she is to stop lantus all together.. The patient will be scheduled to see [facility] and [facility] for a final appointment.   The patient's last Hgb A1c was 6.3. Patient had CGM placed during her last visit on 12/13. Patient reported that she had a fall on Saturday.  She reported her sugar was in the 90s and she got up to make herself something to eat so she could take her metformin when she suddenly fell.  She noticed that she felt a little lightheaded and had some shaking of her upper extremities before she fell.  She reported that she blacked out shortly and when she tried getting up she had severe pain on her left side.  She stated that the pain has improved and she is able to ambulate without difficulty.  Denies any other falls or hypoglycemic events.  She has been taking 7 units of Lantus.  Given patient's recent fall and CGM, there is concern that patient's sugars are too tightly controlled.  We will plan to discontinue Lantus and to start Januvia 100 mg once a day.  Patient receives her medications via mail delivery.  Till she receives her Januvia prescription she is to continue on Lantus 40 units daily.  Once she receives Januvia 100 mg daily she is to stop Lantus altogether.  Plan to follow-up closely after the holidays for CGM download #2.  Plan: - Continue Lantus 4 units daily until patient receives Januvia prescription via mail order; she is to discontinue Lantus altogether when she receives the Januvia prescription - Patient is to continue metformin 500 mg twice daily Follow-up on 01/14/18 for CGM download #2

## 2017-12-31 NOTE — Patient Instructions (Addendum)
[name],  It was a pleasure meeting you today. I want you to take 4 units of Lantus until your new medication Januvia is delivered. Once you receive that medication you can discontinue Lantus and just take Januiva 100 mg once a day. Continue taking your metformin 500 mg twice a day.   Please return in 1 week, 12/27, with [place] for [facility] #2.

## 2018-01-01 ENCOUNTER — Ambulatory Visit (HOSPITAL_COMMUNITY)
Admission: RE | Admit: 2018-01-01 | Discharge: 2018-01-01 | Disposition: A | Discharge status: Home / Self Care | Payer: Medicare HMO | Source: Ambulatory Visit | Attending: Vascular Surgery | Admitting: Vascular Surgery

## 2018-01-01 ENCOUNTER — Other Ambulatory Visit: Payer: Self-pay

## 2018-01-01 ENCOUNTER — Ambulatory Visit (INDEPENDENT_AMBULATORY_CARE_PROVIDER_SITE_OTHER)
Admission: RE | Admit: 2018-01-01 | Discharge: 2018-01-01 | Disposition: A | Discharge status: Home / Self Care | Payer: Medicare HMO | Source: Ambulatory Visit | Attending: Vascular Surgery | Admitting: Vascular Surgery

## 2018-01-01 ENCOUNTER — Ambulatory Visit (INDEPENDENT_AMBULATORY_CARE_PROVIDER_SITE_OTHER): Payer: Medicare HMO | Admitting: Vascular Surgery

## 2018-01-01 ENCOUNTER — Encounter: Payer: Self-pay | Admitting: Vascular Surgery

## 2018-01-01 VITALS — BP 136/85 | HR 84 | Resp 16 | Ht 61.0 in | Wt 213.0 lb

## 2018-01-01 DIAGNOSIS — M79605 Pain in left leg: Secondary | ICD-10-CM

## 2018-01-01 DIAGNOSIS — I779 Disorder of arteries and arterioles, unspecified: Secondary | ICD-10-CM

## 2018-01-01 DIAGNOSIS — Z136 Encounter for screening for cardiovascular disorders: Secondary | ICD-10-CM | POA: Insufficient documentation

## 2018-01-01 DIAGNOSIS — M7989 Other specified soft tissue disorders: Secondary | ICD-10-CM

## 2018-01-01 DIAGNOSIS — Z6841 Body Mass Index (BMI) 40.0 and over, adult: Secondary | ICD-10-CM | POA: Diagnosis not present

## 2018-01-01 DIAGNOSIS — M79604 Pain in right leg: Secondary | ICD-10-CM | POA: Insufficient documentation

## 2018-01-01 DIAGNOSIS — I739 Peripheral vascular disease, unspecified: Secondary | ICD-10-CM

## 2018-01-01 DIAGNOSIS — Z8249 Family history of ischemic heart disease and other diseases of the circulatory system: Secondary | ICD-10-CM

## 2018-01-01 DIAGNOSIS — M79662 Pain in left lower leg: Secondary | ICD-10-CM | POA: Diagnosis not present

## 2018-01-01 DIAGNOSIS — R6889 Other general symptoms and signs: Secondary | ICD-10-CM | POA: Diagnosis not present

## 2018-01-01 DIAGNOSIS — Z95820 Peripheral vascular angioplasty status with implants and grafts: Secondary | ICD-10-CM | POA: Diagnosis not present

## 2018-01-01 NOTE — Progress Notes (Signed)
Patient ID: [name], female   DOB: 25-Sep-1950, 67 y.o.   MRN: [id number]  Reason for Consult: PAD   Referred by Lars Mage, MD  Subjective:     HPI:  [name] is a 67 y.o. female here for follow-up after jetstream atherectomy right SFA and drug-eluting stent placement.  She continues to have right hip pain that radiates up to her back.  She does not have any tissue loss or ulceration.  She is able to walk but is limited from her hip pain.  States that her calf do not give her issues she does not have any rest pain.  Overall she is doing well at this time and is actually coming down on her insulin therapy as her diabetes she thinks is improving.  Past Medical History:  Diagnosis Date  . Allergy   . Anal fissure   . Anxiety   . Cataract   . Chronic headaches   . Depression   . Diverticulosis   . GERD (gastroesophageal reflux disease)   . Glaucoma   . HLD (hyperlipidemia)   . Hypertension   . Hypothyroidism   . PAD (peripheral artery disease) ([place])   . PUD (peptic ulcer disease)   . TIA (transient ischemic attack)    Family History  Problem Relation Age of Onset  . Peripheral vascular disease Mother   . Heart disease Mother   . Hypertension Mother   . AAA (abdominal aortic aneurysm) Father   . Hypertension Father   . Aneurysm Father   . Uterine cancer Sister        mets to brain  . Liver cancer Brother   . Stomach cancer Maternal Aunt        x2  . Stomach cancer Paternal Aunt   . Heart disease Brother   . Kidney cancer Brother   . Anuerysm Brother        brain  . HIV Brother        x2   Past Surgical History:  Procedure Laterality Date  . ABDOMINAL AORTAGRAM N/A 01/20/2013   Procedure: ABDOMINAL Maxcine Ham;  Surgeon: Conrad Prairie, MD;  Location: [facility] CATH LAB;  Service: Cardiovascular;  Laterality: N/A;  . ABDOMINAL AORTOGRAM W/LOWER EXTREMITY N/A 06/03/2017   Procedure: ABDOMINAL AORTOGRAM W/LOWER EXTREMITY;  Surgeon: Waynetta Sandy,  MD;  Location: [facility] CV LAB;  Service: Cardiovascular;  Laterality: N/A;  Bilateral  . ANAL FISSURE REPAIR    . AORTOGRAM    . BREAST REDUCTION SURGERY    . CATARACT EXTRACTION Left   . iliac artery angioplasty and stenting Left 12/2012  . PERIPHERAL VASCULAR ATHERECTOMY  06/03/2017   Procedure: PERIPHERAL VASCULAR ATHERECTOMY and Stent;  Surgeon: Waynetta Sandy, MD;  Location: [facility] LAB;  Service: Cardiovascular;;  Rt. SFA   . REDUCTION MAMMAPLASTY Bilateral   . SFA stent Left 2012   at [facility]  . STOMACH SURGERY    . TOTAL THYROIDECTOMY      Short Social History:  Social History   Tobacco Use  . Smoking status: Current Some Day Smoker    Packs/day: 0.10    Years: 0.50    Pack years: 0.05    Types: Cigarettes  . Smokeless tobacco: Never Used  . Tobacco comment: 1 pack per week  days cutting back   Substance Use Topics  . Alcohol use: No    Alcohol/week: 0.0 standard drinks    Allergies  Allergen Reactions  .  Gadolinium Derivatives Anaphylaxis and Other (See Comments)    Chest tightness  . Asa [Aspirin] Hives and Nausea And Vomiting  . Crestor [Rosuvastatin Calcium] Hives and Itching  . Penicillins Hives and Itching    Has patient had a PCN reaction causing immediate rash, facial/tongue/throat swelling, SOB or lightheadedness with hypotension: Yes Has patient had a PCN reaction causing severe rash involving mucus membranes or skin necrosis: Yes Has patient had a PCN reaction that required hospitalization No Has patient had a PCN reaction occurring within the last 10 years: No If all of the above answers are "NO", then may proceed with Cephalosporin use.     Current Outpatient Medications  Medication Sig Dispense Refill  . ACCU-CHEK AVIVA PLUS test strip USE 3 TIMES DAILY TO CHECK BLOOD SUGAR.  300 each 1  . ACCU-CHEK SOFTCLIX LANCETS lancets Use to check blood sugar up to 4 times daily. diag code E11.51. Insulin  dependent 400 each 1  . acetaminophen (TYLENOL) 500 MG tablet Take 2 tablets (1,000 mg total) by mouth every 8 (eight) hours as needed for moderate pain. 90 tablet 1  . Alcohol Swabs (B-D SINGLE USE SWABS REGULAR) PADS USE AS NEEDED AS DIRECTED 200 each 1  . amLODipine (NORVASC) 10 MG tablet     . atorvastatin (LIPITOR) 40 MG tablet Take 1 tablet (40 mg total) by mouth daily. 90 tablet 0  . BESIVANCE 0.6 % SUSP SHAKE LQ AND INT 1 GTT IN OD TID  1  . Blood Glucose Calibration (ACCU-CHEK AVIVA) SOLN     . celecoxib (CELEBREX) 100 MG capsule Take 1 capsule (100 mg total) by mouth 2 (two) times daily. 60 capsule 0  . Cholecalciferol (VITAMIN D3) 400 units CAPS Take 400 Units by mouth daily. 90 capsule 1  . doxepin (SINEQUAN) 150 MG capsule TAKE 1 CAPSULE  AT BEDTIME. 60 capsule 0  . DUREZOL 0.05 % EMUL PLACE 1 DROP IN RIGHT EYE 3 TIMES DAILY  1  . gabapentin (NEURONTIN) 400 MG capsule TAKE 1 CAPSULE EVERY MORNING, 1 CAPSULE AT LUNCH, AND 2 CAPSULES AT BEDTIME 360 capsule 0  . insulin glargine (LANTUS) 100 UNIT/ML injection Inject 0.04 mLs (4 Units total) into the skin daily before breakfast. 3 vial 1  . Insulin Syringe-Needle U-100 31G X 15/64" 0.3 ML MISC Use to inject insulin daily 100 each 5  . levothyroxine (SYNTHROID, LEVOTHROID) 112 MCG tablet TAKE 1 TABLET ONE TIME DAILY BEFORE BREAKFAST 90 tablet 0  . metFORMIN (GLUCOPHAGE-XR) 500 MG 24 hr tablet TAKE 1 TABLET TWICE DAILY BEFORE LUNCH AND SUPPER 180 tablet 1  . OVER THE COUNTER MEDICATION Take 500 mg by mouth daily. Potassium 500mg     . pantoprazole (PROTONIX) 40 MG tablet TAKE 1 TABLET TWICE DAILY 180 tablet 0  . Polyethyl Glycol-Propyl Glycol (SYSTANE OP) Place 1 drop into both eyes daily.    . sitaGLIPtin (JANUVIA) 100 MG tablet Take 1 tablet (100 mg total) by mouth daily. 30 tablet 2  . varenicline (CHANTIX STARTING MONTH PAK) 0.5 MG X 11 & 1 MG X 42 tablet Take one 0.5 mg tablet by mouth once daily for 3 days, then increase to one 0.5 mg  tablet twice daily for 4 days, then increase to one 1 mg tablet twice daily. 53 tablet 0  . amLODipine (NORVASC) 10 MG tablet Take 1 tablet (10 mg total) by mouth daily. 90 tablet 1  . lisinopril-hydrochlorothiazide (PRINZIDE,ZESTORETIC) 20-25 MG tablet Take 1 tablet by mouth daily. 90 tablet 1  No current facility-administered medications for this visit.     Review of Systems  Constitutional:  Constitutional negative. HENT: HENT negative.  Eyes: Eyes negative.  Cardiovascular: Positive for claudication.  GI: Gastrointestinal negative.  Musculoskeletal: Positive for back pain, gait problem and joint pain.  Skin: Skin negative.  Neurological: Positive for focal weakness.  Hematologic: Hematologic/lymphatic negative.  Psychiatric: Psychiatric negative.        Objective:  Objective   Vitals:   01/01/18 0942  BP: 136/85  Pulse: 84  Resp: 16  SpO2: 100%  Weight: 213 lb (96.6 kg)  Height: 5\' 1"  (1.549 m)   Body mass index is 40.25 kg/m.  Physical Exam HENT:     Head: Normocephalic.  Eyes:     Pupils: Pupils are equal, round, and reactive to light.  Neck:     Musculoskeletal: Normal range of motion and neck supple.     Vascular: No carotid bruit.  Cardiovascular:     Rate and Rhythm: Normal rate.     Pulses:          Femoral pulses are 2+ on the right side and 2+ on the left side. Abdominal:     General: Abdomen is flat.     Palpations: Abdomen is soft.  Musculoskeletal: Normal range of motion.        General: No swelling.  Skin:    General: Skin is dry.  Neurological:     General: No focal deficit present.     Mental Status: She is alert.  Psychiatric:        Mood and Affect: Mood normal.        Behavior: Behavior normal.        Thought Content: Thought content normal.        Judgment: Judgment normal.     Data: I have independently interpreted her ABIs to be 0.92 right and 0.84 left I have independently interpreted her bilateral lower extremity duplex  which demonstrates right side patent stent with no evidence of stenosis with a 50% stenosis in the proximal SFA distal to the stent distally the SFA velocity is 39 cm/s and monophasic.  Left lower extremity duplex demonstrates proximal tibial disease         Assessment/Plan:    67 year old female has undergone right lower extremity stenting for pain with diminished ABI.  Her ABIs are now only mildly decreased bilaterally and her symptoms appear to be more neurologic in nature.  There does appear to be some SFA disease remaining in the right but I would not chase this at this time given that she does appear to asymptomatic distally.  She can follow-up in 1 year with repeat studies.      Waynetta Sandy MD [facility]

## 2018-01-04 ENCOUNTER — Other Ambulatory Visit: Payer: Self-pay | Admitting: *Deleted

## 2018-01-04 NOTE — Patient Outreach (Signed)
[facility]) Care Management  01/04/2018  [name] Jun 27, 1950 [id number]   [place]  Referral Date:06/09/2017 Referral Source:Transfer from [place] Reason for Referral:Continued Disease Management Education Insurance:[employer]   Outreach Attempt:  Outreach attempt #1 to patient for monthly follow up. No answer. RN Health Coach left HIPAA compliant voicemail message along with contact information.  Plan:  RN Health Coach will make another outreach attempt within the month of January.  [place] [phone] [name]

## 2018-01-11 ENCOUNTER — Other Ambulatory Visit: Payer: Self-pay | Admitting: Internal Medicine

## 2018-01-14 ENCOUNTER — Encounter: Payer: Self-pay | Admitting: Dietician

## 2018-01-14 ENCOUNTER — Encounter: Payer: Self-pay | Admitting: *Deleted

## 2018-01-14 ENCOUNTER — Other Ambulatory Visit: Payer: Self-pay | Admitting: *Deleted

## 2018-01-14 ENCOUNTER — Other Ambulatory Visit: Payer: Self-pay | Admitting: Internal Medicine

## 2018-01-14 ENCOUNTER — Ambulatory Visit: Payer: Self-pay

## 2018-01-14 DIAGNOSIS — R531 Weakness: Secondary | ICD-10-CM

## 2018-01-14 NOTE — Patient Outreach (Signed)
[facility] [name]) Care Management  [place]  01/14/2018   [name] 10/08/1950 [id number]   [place] Monthly Outreach   Referral Date:  06/09/2017 Referral Source:  Transfer from [place]  Reason for Referral:  Continued Disease Management Education Insurance:  [facility] Medicare    Outreach Attempt:  Successful telephone outreach to patient for monthly follow up. HIPAA verified with patient.  Patient reporting she is doing better.  States she is still out of town with her family and will return home this Saturday, January 4.  States she is currently wearing a continuous blood glucose monitor placed on by her primary care provider's office a few weeks ago.  This mornings fasting blood sugar was 130 with fasting ranges of 100-130's.  States she has had about 2 episodes of hypoglycemia in the 70's in the past 2 weeks.  Reports a fall last month related to hypoglycemia.  Fall precautions and preventions reviewed and discussed; patient encouraged to keep a snack at her bedside at all times.  Discussed using walker for ambulation and patient states she will discuss obtaining walker with her primary care provider.  Has not been able to arrange outpatient appointment with therapist.  Encouraged patient to contact [facility] Social Worker to assist with scheduling appointment.  Encounter Medications:  Outpatient Encounter Medications as of 01/14/2018  Medication Sig Note  . ACCU-CHEK AVIVA PLUS test strip USE 3 TIMES DAILY TO CHECK BLOOD SUGAR.    Marland Kitchen ACCU-CHEK SOFTCLIX LANCETS lancets Use to check blood sugar up to 4 times daily. 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 acetaminophen (TYLENOL) 500 MG tablet Take 2 tablets (1,000 mg total) by mouth every 8 (eight) hours as needed for moderate pain.   . Alcohol Swabs (B-D SINGLE USE SWABS REGULAR) PADS USE AS NEEDED AS DIRECTED   . amLODipine (NORVASC) 10 MG tablet    . BESIVANCE 0.6 % SUSP SHAKE LQ AND INT 1 GTT IN OD  TID   . Blood Glucose Calibration (ACCU-CHEK AVIVA) SOLN    . celecoxib (CELEBREX) 100 MG capsule Take 1 capsule (100 mg total) by mouth 2 (two) times daily.   Marland Kitchen doxepin (SINEQUAN) 150 MG capsule TAKE 1 CAPSULE  AT BEDTIME.   . DUREZOL 0.05 % EMUL PLACE 1 DROP IN RIGHT EYE 3 TIMES DAILY   . furosemide (LASIX) 20 MG tablet Take 10 mg by mouth every other day.   . gabapentin (NEURONTIN) 400 MG capsule TAKE 1 CAPSULE EVERY MORNING, 1 CAPSULE AT LUNCH, AND 2 CAPSULES AT BEDTIME   . insulin glargine (LANTUS) 100 UNIT/ML injection Inject 0.04 mLs (4 Units total) into the skin daily before breakfast.   . Insulin Syringe-Needle U-100 31G X 15/64" 0.3 ML MISC Use to inject insulin daily   . levothyroxine (SYNTHROID, LEVOTHROID) 112 MCG tablet TAKE 1 TABLET ONE TIME DAILY BEFORE BREAKFAST   . metFORMIN (GLUCOPHAGE-XR) 500 MG 24 hr tablet TAKE 1 TABLET TWICE DAILY BEFORE LUNCH AND SUPPER   . pantoprazole (PROTONIX) 40 MG tablet TAKE 1 TABLET TWICE DAILY   . Polyethyl Glycol-Propyl Glycol (SYSTANE OP) Place 1 drop into both eyes daily.   Marland Kitchen amLODipine (NORVASC) 10 MG tablet Take 1 tablet (10 mg total) by mouth daily. 10/01/2017: Reports continues to take  . amLODipine (NORVASC) 10 MG tablet TAKE 1 TABLET EVERY DAY   . atorvastatin (LIPITOR) 40 MG tablet Take 1 tablet (40 mg total) by mouth daily. 01/14/2018: Reports not taking  .  Cholecalciferol (VITAMIN D3) 400 units CAPS Take 400 Units by mouth daily. (Patient not taking: Reported on 01/14/2018) 07/07/2017: Not currently taking  . lisinopril-hydrochlorothiazide (PRINZIDE,ZESTORETIC) 20-25 MG tablet Take 1 tablet by mouth daily. 01/14/2018: Continues to take  . OVER THE COUNTER MEDICATION Take 500 mg by mouth daily. Potassium 560m   . sitaGLIPtin (JANUVIA) 100 MG tablet Take 1 tablet (100 mg total) by mouth daily. (Patient not taking: Reported on 01/14/2018) 01/14/2018: Needs to get prescription  . varenicline (CHANTIX STARTING MONTH PAK) 0.5 MG X 11 & 1 MG X 42 tablet  Take one 0.5 mg tablet by mouth once daily for 3 days, then increase to one 0.5 mg tablet twice daily for 4 days, then increase to one 1 mg tablet twice daily. (Patient not taking: Reported on 01/14/2018) 10/01/2017: Patient has not started this medication yet   No facility-administered encounter medications on file as of 01/14/2018.     Functional Status:  In your present state of health, do you have any difficulty performing the following activities: 12/31/2017 12/25/2017  Hearing? N N  Vision? N N  Difficulty concentrating or making decisions? N N  Comment - -  Walking or climbing stairs? Y N  Comment - -  Dressing or bathing? N N  Comment - -  Doing errands, shopping? Y Y  Comment - SCAT   Preparing Food and eating ? - -  Using the Toilet? - -  In the past six months, have you accidently leaked urine? - -  Do you have problems with loss of bowel control? - -  Managing your Medications? - -  Managing your Finances? - -  Housekeeping or managing your Housekeeping? - -  Some recent data might be hidden    Fall/Depression Screening: Fall Risk  01/14/2018 12/31/2017 12/25/2017  Falls in the past year? 1 1 1   Comment - - -  Number falls in past yr: 1 1 1   Comment - - -  Injury with Fall? 1 1 0  Comment - - -  Risk Factor Category  High Risk (2 or more Points) - -  Risk for fall due to : Impaired balance/gait;History of fall(s);Impaired vision;Medication side effect;Impaired mobility Impaired balance/gait;History of fall(s) History of fall(s);Impaired balance/gait  Risk for fall due to: Comment - - -  Follow up Falls prevention discussed;Falls evaluation completed;Education provided Falls prevention discussed Falls prevention discussed   PHQ 2/9 Scores 12/31/2017 12/25/2017 10/05/2017 08/21/2017 07/20/2017 07/17/2017 07/07/2017  PHQ - 2 Score 2 2 2 2 2 6 3   PHQ- 9 Score 6 8 5 5 8 21 8    THN CM Care Plan Problem One     Most Recent Value  Care Plan Problem One  Knowledge deficit related to  self care management of diabetes and depression  Role Documenting the Problem One  [place]  Active  THN Long Term Goal   Patient will maintain A1C of 7 or below in the next 90 days.  THN Long Term Goal Start Date  01/14/18  THN Long Term Goal Met Date  01/14/18  Interventions for Problem One Long Term Goal  Congratulated patient on current Hgb A1C, reviewed recent medication changes, reviewed medications and encouraged compliance, encouraged patient to discuss statin treatment with provider, encouraged to keep and attend scheduled medical appointments, encouraged patient to eat balanced meals throughout the day to help prevent hypoglycemia, smoking cessation discussed and encouraged  THN CM Short Term Goal #1  Patient will verify if therapy consult/order is for home health therapy versus outpatient therapy in the next 60 days.  THN CM Short Term Goal #1 Start Date  01/14/18  Interventions for Short Term Goal #1  Fall precautions and preventions reviewed and discussed with patient, encouraged patient to discuss prescription for walker and home health therapy versus outpatient therapy at next medical appointment, encouraged patient to make sure she has snack (food/drink/candy) at her bedside at all times, encouraged patient to eat sufficient dinner and bedtime snack as well as at least 2-3 meals throughout the day to prevent hypoglycemia, discussed healthier meal and drink options  THN CM Short Term Goal #2   Patient will report taking Januvia and stopping Lantus injections in the next 30 days.  THN CM Short Term Goal #2 Start Date  01/14/18  [facility] CM Short Term Goal #2 Met Date  01/14/18  Interventions for Short Term Goal #2  Reviewed last office note with patient and discussion regarding changing Lantus injections to Januvia tablets, encouraged patient to verify Januvia medication has been delivered to home as soon as possible, discussed importance of stopping Lantus once  patient started taking Januvia tablets      Appointments:  Was last seen by primary care on 12/31/2017 and has scheduled follow up on 01/25/2017.  Plan: RN Health Coach will send primary care provider Quarterly Update. RN Health Coach will make next telephone outreach to patient within the month of March.  [place] [phone] [email or web address]

## 2018-01-19 ENCOUNTER — Other Ambulatory Visit: Payer: Self-pay | Admitting: Internal Medicine

## 2018-01-19 DIAGNOSIS — F332 Major depressive disorder, recurrent severe without psychotic features: Secondary | ICD-10-CM

## 2018-01-19 DIAGNOSIS — I10 Essential (primary) hypertension: Secondary | ICD-10-CM

## 2018-01-25 ENCOUNTER — Encounter: Payer: Self-pay | Admitting: Dietician

## 2018-01-25 ENCOUNTER — Other Ambulatory Visit: Payer: Self-pay

## 2018-01-25 ENCOUNTER — Ambulatory Visit (INDEPENDENT_AMBULATORY_CARE_PROVIDER_SITE_OTHER): Payer: Medicare HMO | Admitting: Internal Medicine

## 2018-01-25 ENCOUNTER — Ambulatory Visit (INDEPENDENT_AMBULATORY_CARE_PROVIDER_SITE_OTHER): Payer: Medicare HMO | Admitting: Dietician

## 2018-01-25 ENCOUNTER — Encounter (INDEPENDENT_AMBULATORY_CARE_PROVIDER_SITE_OTHER): Payer: Self-pay

## 2018-01-25 ENCOUNTER — Other Ambulatory Visit: Payer: Self-pay | Admitting: Internal Medicine

## 2018-01-25 ENCOUNTER — Ambulatory Visit (HOSPITAL_COMMUNITY)
Admission: RE | Admit: 2018-01-25 | Discharge: 2018-01-25 | Disposition: A | Discharge status: Home / Self Care | Payer: Medicare HMO | Source: Ambulatory Visit | Attending: Internal Medicine | Admitting: Internal Medicine

## 2018-01-25 VITALS — BP 156/84 | HR 96 | Temp 98.2°F | Ht 61.0 in | Wt 213.6 lb

## 2018-01-25 DIAGNOSIS — R42 Dizziness and giddiness: Secondary | ICD-10-CM

## 2018-01-25 DIAGNOSIS — Z794 Long term (current) use of insulin: Secondary | ICD-10-CM | POA: Diagnosis not present

## 2018-01-25 DIAGNOSIS — Z6841 Body Mass Index (BMI) 40.0 and over, adult: Secondary | ICD-10-CM | POA: Diagnosis not present

## 2018-01-25 DIAGNOSIS — E1151 Type 2 diabetes mellitus with diabetic peripheral angiopathy without gangrene: Secondary | ICD-10-CM | POA: Diagnosis not present

## 2018-01-25 DIAGNOSIS — L98499 Non-pressure chronic ulcer of skin of other sites with unspecified severity: Secondary | ICD-10-CM | POA: Diagnosis not present

## 2018-01-25 DIAGNOSIS — L03012 Cellulitis of left finger: Secondary | ICD-10-CM

## 2018-01-25 DIAGNOSIS — E118 Type 2 diabetes mellitus with unspecified complications: Secondary | ICD-10-CM

## 2018-01-25 DIAGNOSIS — Z713 Dietary counseling and surveillance: Secondary | ICD-10-CM | POA: Diagnosis not present

## 2018-01-25 DIAGNOSIS — E11621 Type 2 diabetes mellitus with foot ulcer: Secondary | ICD-10-CM | POA: Diagnosis not present

## 2018-01-25 DIAGNOSIS — F17210 Nicotine dependence, cigarettes, uncomplicated: Secondary | ICD-10-CM | POA: Diagnosis not present

## 2018-01-25 MED ORDER — CEPHALEXIN 500 MG PO CAPS: 500.0000 mg | ORAL_CAPSULE | Freq: Four times a day (QID) | ORAL | 0 refills | Status: DC

## 2018-01-25 NOTE — Assessment & Plan Note (Signed)
CGM record shows, average blood glucose of 133, 9% below 70, and 0% above target. (Hypoglycemia frequency improved from 16% to 9% compared to last CGM reading and after decreasing Insulin. She still reports some dizziness  when she is hypoglycemic, but no episode of syncope or syncopegenerally and overly improved regarding symptoms since insulin decreased. She has been on 4 units of Lantus daily, Metformin 500 mg twice daily. (Has received Januvia but has not started that yet).  -Stop Lantus. -Start Januvia 100 mg daily -Continue Metformin 500 mg twice daily -Return to clinic in 2 weeks for follow-up -Record blood glucose at home blood glucose meter and bring it to clinic in next visit (Avoid left index finger for finger stick)

## 2018-01-25 NOTE — Patient Instructions (Addendum)
Good job on making healthier food choices! Quitting regular soda and eating fruit daily are both healthy changes.   Including vegetables in your meals each day may help lower your blood pressure.   Please follow up in about 6 months.  Butch Penny [phone]

## 2018-01-25 NOTE — Progress Notes (Signed)
  Medical Nutrition Therapy:  Appt start time: 1330 end time:  1410. Visit # 2  Assessment: Primary concerns with CGM and learning to use her blood pressure monitor. Patient reports low blood sugar symptoms over the past 30 days. She reports lows at 2-3 am in the morning, and wakes and eats a snack to prevent this. CGM sensor downloaded with 14 days of complete data.   CGM Results from Download #2 Average is  96  for 14 days Time in range (70-180 mg/dL): 91% (Goal >70%) Time above range (>180 mg/dL) 0% Time below range (<70 mg/dL): 9% (Goal is <3%) Coefficient of variation: 23.9% (Goal is <36%) Standard variation: 22.9 mg/dL (Goal is <50 mg/dL)  ANTHROPOMETRICS: Estimated body mass index is 40.36 kg/m as calculated from the following:   Height as of an earlier encounter on 01/25/18: 5\' 1"  (1.549 m).   Weight as of an earlier encounter on 01/25/18: 213 lb 9.6 oz (96.9 kg).   Lab Results  Component Value Date   HGBA1C 6.3 (A) 12/25/2017   HGBA1C 6.1 (A) 08/21/2017   HGBA1C 8.1 05/15/2017   HGBA1C >14.0 03/10/2017   HGBA1C 5.8 09/20/2012     MEDICATIONS: Lantus 4 units daily, about 9am; Has not started Januvia as prescribed, because she was not sure what it was for.   DIETARY INTAKE: Usual eating pattern includes 3 meals and 1-2 snacks per day, including fruits, lean proteins, avoids red meat. Eats vegetables twice a week. Patients main barrier to vegetable consumption is limited fridge space, she does not like frozen vegetables and tries to eat canned vegetables.   24-hr recall:  B (AM): toast or grits with butter, coffee with creamer and non nutritive sweetener  Lunch: tuna sandwich Dinner: meat, starch and vegetable Snk (PM): fruit or cracker with peanut butter Beverages: coffee, water  Need to assess sleep and physical activity at future visit.  Progress Towards Goal(s):  In progress.   Nutritional Diagnosis:  NB-1.1 Food and nutrition-related knowledge deficit As related  to lack of sufficient prior training in diabetes meal planning is improving.  As evidenced by her CGM results of average blood glucose of 96, and staying in target 91% of the time. .    Intervention:  Nutrition education about diabetes meal planing and interpreting her blood glucose numbers and meter download printout, educated her on how to use her blood pressure monitor.    Teaching Method Utilized: Visual. Auditory. Hands on Handouts given during visit include: CGM results,  After visit summary, blood glucose monitor results Barriers to learning/adherence to lifestyle change: limited material resources Demonstrated degree of understanding via: Questions  Monitoring/Evaluation:  Dietary intake, exercise, meter, and body weight in 6 month(s)  .last Debera Lat, RD 01/25/2018 2:26 PM.

## 2018-01-25 NOTE — Progress Notes (Signed)
CC: CGM review and finger ulcer HPI:  [name] is a 68 y.o. female with past medical history listed below, came to the clinic today for CGM review.  She also complains of unhealing left index finger ulcer that initially started about 3 weeks ago. Please refer to problem based charting for further details and assessment and plan.  Past Medical History:  Diagnosis Date  . Allergy   . Anal fissure   . Anxiety   . Cataract   . Chronic headaches   . Depression   . Diverticulosis   . GERD (gastroesophageal reflux disease)   . Glaucoma   . HLD (hyperlipidemia)   . Hypertension   . Hypothyroidism   . PAD (peripheral artery disease) ([place])   . PUD (peptic ulcer disease)   . TIA (transient ischemic attack)    Family history: HTN                                                    Mother Heart disease                                    Mother HTN                                                   Father Abdominal aortic aneurysm              Father  Social Hx: Smokes 7-8 cigarettes daily No alcohol No drug use   Review of Systems:  Review of Systems  Constitutional: Negative for chills and fever.  Respiratory: Negative for cough.   Cardiovascular: Negative for chest pain and palpitations.  Gastrointestinal: Negative for abdominal pain and diarrhea.  Skin: Negative for rash.  Neurological: Positive for dizziness.    Physical Exam:  Vitals:   01/25/18 1419  BP: (!) 156/84  Pulse: 96  Temp: 98.2 F (36.8 C)  TempSrc: Oral  SpO2: 100%  Weight: 213 lb 9.6 oz (96.9 kg)  Height: 5\' 1"  (1.549 m)   Physical Exam Vitals signs reviewed.  Constitutional:      Appearance: Normal appearance.  HENT:     Head: Normocephalic and atraumatic.  Eyes:     Extraocular Movements: Extraocular movements intact.  Cardiovascular:     Rate and Rhythm: Normal rate and regular rhythm.     Pulses: Normal pulses.     Heart sounds: Normal heart sounds. No murmur.  Pulmonary:   Effort: Pulmonary effort is normal. No respiratory distress.     Breath sounds: Normal breath sounds. No wheezing.  Abdominal:     Palpations: Abdomen is soft.     Tenderness: There is no abdominal tenderness.  Skin: Opened blister of left index finger, with erythema at base, swelling around nail base, no evidence of compartment syndrome, no active infection Neurological:     Mental Status: She is alert and oriented to person, place, and time.  Psychiatric:        Mood and Affect: Mood normal.      Thought Content: Thought content normal.        Judgment: Judgment normal.  Assessment & Plan:   See Encounters Tab for problem based charting.  Patient seen with Dr. Lynnae January

## 2018-01-25 NOTE — Progress Notes (Signed)
Internal Medicine Clinic Attending  I saw and evaluated the patient.  I personally confirmed the key portions of the history and exam documented by Dr. Myrtie Hawk and I reviewed pertinent patient test results.  The assessment, diagnosis, and plan were formulated together and I agree with the documentation in the resident's note.   I personally reviewed the CGM data, the resident's interpretation, and agree with the intervention. Her fingertip pad is soft, the pain is no longer severe, and there is no dependent pain so I agree with Dr Myrtie Hawk that a felon is very unlikely. A paronychia is possible as she still gets drainage around the nail base. The excoriated area on the distal dorsal finger is dry, not erythematous, but is not healing I think bc the leading edge is rolled under and may need debridement. Agree with ABX, H2O2 soak only if diluted 1:10 parts, and referral to hand surgery.

## 2018-01-25 NOTE — Patient Instructions (Addendum)
Thank you for allowing [place] to provide your care today. Today we discussed your diabetes and  the ulcer on your left index finger. I prescribe antibiotic (Keflex) for you, and order X ray to make sure there is no foreign body in your finger and no bone involvement. As we talked, I also refer you to a hand surgeon. Please follow up.  For your diabetes, you did a great job. As we talked, I stop Insulin for you and start you with Januvia 100 mg daily. Please make sure to check your blood sugar and bring the records on your next visit. (Avoid your left index finger, when checking your blood sugar) Please follow-up in  2 weeks.  Should you have any questions or concerns please call the internal medicine clinic at [phone].

## 2018-01-25 NOTE — Assessment & Plan Note (Signed)
Patient presented with left index finger swelling, ulcer that is started 3 weeks ago when she injured her finger while was opening crab legs shell.  She initially did not pay attention to that but it got worse and more painful and developed a large blister with drainage clear liquid.  She reports that the blister opened and the skin underneath exposed.  The pain is improved but she reports numbness on her fingertip and swelling around her nail. (She endorses that she used peroxide and alcohol on it) Reports that she still has gradual drainage around her nailbed.  Denies any fever chills.  She is concerned about not feeling because she has diabetes.  On exam:  Afebrile.  Area of nonhealing, erythematous peeled off blister at dorsal od distal phalanges and around the nail.  No purulent discharge. Has swelling on distal of left index finger without evidence of compartment syndrome  Paronychia vs foreign body.   having history of DM and, also since injury was initially caused by crab legs shell (and to cover for possible Mycobacterium marinum infection) I prescribe Keflex.  Will do x-ray to evaluate for any foreign body and bone involvement. She has swelling but no evidence of compartment syndrome and no evidence of active infection currently, however, patient reports gradual drainage from area close to nail bed. Will send a referral to hand surgery for further evaluation.  -Keflex 500 mg every 6 hours x7 days -Left index finger x-ray -Referral to hand surgery -Recommended to avoid peroxide and alcohol or other chemical

## 2018-01-27 ENCOUNTER — Other Ambulatory Visit: Payer: Self-pay | Admitting: Internal Medicine

## 2018-01-27 ENCOUNTER — Telehealth: Payer: Self-pay | Admitting: *Deleted

## 2018-01-27 DIAGNOSIS — L03012 Cellulitis of left finger: Secondary | ICD-10-CM

## 2018-01-27 DIAGNOSIS — K219 Gastro-esophageal reflux disease without esophagitis: Secondary | ICD-10-CM

## 2018-01-27 MED ORDER — CEPHALEXIN 500 MG PO CAPS: 500.0000 mg | ORAL_CAPSULE | Freq: Four times a day (QID) | ORAL | 0 refills | Status: AC

## 2018-01-27 NOTE — Telephone Encounter (Signed)
Please clarify quantity and directions for cephalexin 500mg  capsules- Rx received by [employer]:  Keflex 500mg   4 times daily for 10 days #28.  Per chart note:  Keflex 500 mg every 6 hours x7 days.  Will send to ordering MD to clarification and to send in new rx if appropriate.  Please advise.[name], [name]1/15/20209:35 AM

## 2018-01-27 NOTE — Telephone Encounter (Signed)
Next appt scheduled 12/4 with PCP. 

## 2018-01-28 NOTE — Telephone Encounter (Signed)
Hi, I filled up the correction form yesterday. ( 28 pills for 7 days). It should be good now. Thanks

## 2018-01-29 ENCOUNTER — Other Ambulatory Visit: Payer: Self-pay | Admitting: Internal Medicine

## 2018-01-29 ENCOUNTER — Telehealth: Payer: Self-pay | Admitting: Internal Medicine

## 2018-01-29 NOTE — Telephone Encounter (Signed)
Patient called in stating [employer] still needs more information on Keflex. Call placed to [facility]. Confirmed that Rx is for 7 days, 28 tabs. They had received corrected Rx but original Rx had not been cancelled.  Also did not receive Rx for pantoprazole sent by PCP on 01/27/2018 even though we received confirmation receipt. This Rx was called to [facility] pharmacist, Daivd Council. Patient notified and is very Patent attorney. Hubbard Hartshorn, RN, BSN

## 2018-01-29 NOTE — Telephone Encounter (Signed)
Thank you Lauren. so, no further action needed for Keflex prescription. Right? best Ellie

## 2018-01-29 NOTE — Telephone Encounter (Signed)
Called [name] and talk to her about her finger Xray. Showed soft tissue swelling. No radiopaque foreign body and no bony abnormality.  Her questions were addressed. We keep up the previous plan of taking Keflex and hand surgeon visit.

## 2018-02-02 ENCOUNTER — Ambulatory Visit (INDEPENDENT_AMBULATORY_CARE_PROVIDER_SITE_OTHER): Payer: Medicare HMO | Admitting: Orthopaedic Surgery

## 2018-02-02 ENCOUNTER — Other Ambulatory Visit: Payer: Self-pay | Admitting: Internal Medicine

## 2018-02-02 ENCOUNTER — Encounter: Payer: Self-pay | Admitting: *Deleted

## 2018-02-02 DIAGNOSIS — K219 Gastro-esophageal reflux disease without esophagitis: Secondary | ICD-10-CM

## 2018-02-03 NOTE — Telephone Encounter (Signed)
Ok to fill, she has ben on celebrex in the past and benefited from it.

## 2018-02-03 NOTE — Telephone Encounter (Signed)
Call made to [facility] with Heidi (pharmacist) verbal authorization given to fill celebrex.[name], Candice Bedwell Cassady1/22/202011:03 AM

## 2018-02-03 NOTE — Telephone Encounter (Signed)
Received fax from [facility] with the following message: "New rx for celecoxib 100mg  cap. Patient reports salicylates allergy and there is a potential for cross-sensitivity. Please confirm if ok to fill this med."  MD will have to give authorization to have this medication filled. Will send to pcp for review, please advise.[name]1/22/20209:34 AM   Mcarthur Rossetti [phone]

## 2018-02-04 NOTE — Progress Notes (Signed)
   CC: Diabetes Mellitus Type 2  HPI:  [name] is a 68 y.o. with chronic hypertension, dm2, hypothyroidism, pad, mdd who presented for follow up of dm2. Please see problem based charting for evaluation, assessment, and plan.  Past Medical History:  Diagnosis Date  . Allergy   . Anal fissure   . Anxiety   . Cataract   . Chronic headaches   . Depression   . Diverticulosis   . GERD (gastroesophageal reflux disease)   . Glaucoma   . HLD (hyperlipidemia)   . Hypertension   . Hypothyroidism   . PAD (peripheral artery disease) ([place])   . PUD (peptic ulcer disease)   . TIA (transient ischemic attack)    Review of Systems:    Review of Systems  Constitutional: Negative for chills and fever.  Respiratory: Negative for shortness of breath.   Cardiovascular: Negative for chest pain.  Gastrointestinal: Negative for abdominal pain, nausea and vomiting.  Neurological: Positive for dizziness. Negative for headaches.   Physical Exam:  Vitals:   02/05/18 1512  BP: 135/85  Pulse: 86  Temp: 97.6 F (36.4 C)  TempSrc: Oral  SpO2: 97%  Weight: 215 lb (97.5 kg)  Height: 5\' 1"  (1.549 m)   Physical Exam  Constitutional: Appears well-developed and well-nourished. No distress.  HENT:  Head: Normocephalic and atraumatic.  Eyes: Conjunctivae are normal.  Cardiovascular: Normal rate, regular rhythm and normal heart sounds.  Respiratory: Effort normal and breath sounds normal. No respiratory distress. No wheezes.  GI: Soft. Bowel sounds are normal. No distension. There is no tenderness.  Musculoskeletal: No edema.  Neurological: Is alert.  Skin: Not diaphoretic. No erythema. Small indentations noted on the right wrist that are not pruritic or erythematous. Psychiatric: Normal mood and affect. Behavior is normal. Judgment and thought content normal.   Assessment & Plan:   See Encounters Tab for problem based charting.  Patient discussed with Dr. Evette Doffing

## 2018-02-05 ENCOUNTER — Ambulatory Visit (INDEPENDENT_AMBULATORY_CARE_PROVIDER_SITE_OTHER): Payer: Medicare HMO | Admitting: Internal Medicine

## 2018-02-05 ENCOUNTER — Other Ambulatory Visit: Payer: Self-pay

## 2018-02-05 ENCOUNTER — Encounter: Payer: Self-pay | Admitting: Internal Medicine

## 2018-02-05 VITALS — BP 135/85 | HR 86 | Temp 97.6°F | Ht 61.0 in | Wt 215.0 lb

## 2018-02-05 DIAGNOSIS — X58XXXA Exposure to other specified factors, initial encounter: Secondary | ICD-10-CM

## 2018-02-05 DIAGNOSIS — F329 Major depressive disorder, single episode, unspecified: Secondary | ICD-10-CM

## 2018-02-05 DIAGNOSIS — Z0000 Encounter for general adult medical examination without abnormal findings: Secondary | ICD-10-CM

## 2018-02-05 DIAGNOSIS — Z7984 Long term (current) use of oral hypoglycemic drugs: Secondary | ICD-10-CM

## 2018-02-05 DIAGNOSIS — S6992XA Unspecified injury of left wrist, hand and finger(s), initial encounter: Secondary | ICD-10-CM | POA: Diagnosis not present

## 2018-02-05 DIAGNOSIS — E1151 Type 2 diabetes mellitus with diabetic peripheral angiopathy without gangrene: Secondary | ICD-10-CM | POA: Diagnosis not present

## 2018-02-05 DIAGNOSIS — I10 Essential (primary) hypertension: Secondary | ICD-10-CM

## 2018-02-05 DIAGNOSIS — Z2821 Immunization not carried out because of patient refusal: Secondary | ICD-10-CM | POA: Diagnosis not present

## 2018-02-05 DIAGNOSIS — E2839 Other primary ovarian failure: Secondary | ICD-10-CM

## 2018-02-05 DIAGNOSIS — E039 Hypothyroidism, unspecified: Secondary | ICD-10-CM

## 2018-02-05 DIAGNOSIS — R6889 Other general symptoms and signs: Secondary | ICD-10-CM | POA: Diagnosis not present

## 2018-02-05 DIAGNOSIS — Z9181 History of falling: Secondary | ICD-10-CM | POA: Diagnosis not present

## 2018-02-05 DIAGNOSIS — R42 Dizziness and giddiness: Secondary | ICD-10-CM | POA: Diagnosis not present

## 2018-02-05 DIAGNOSIS — Z79899 Other long term (current) drug therapy: Secondary | ICD-10-CM

## 2018-02-05 HISTORY — DX: Other primary ovarian failure: E28.39

## 2018-02-05 LAB — GLUCOSE, CAPILLARY: Glucose-Capillary: 86 mg/dL (ref 70–99)

## 2018-02-05 NOTE — Assessment & Plan Note (Signed)
Ordered a dexa scan

## 2018-02-05 NOTE — Assessment & Plan Note (Signed)
The patient's blood pressure during this visit was 135/85. The patient is currently taking amlodipine 10mg  qd, lisinopril-hctz 20-25mg  qd. His last blood pressure visits are   BP Readings from Last 3 Encounters:  02/05/18 135/85  01/25/18 (!) 156/84  01/01/18 136/85   The patient states that she has been having dizziness over the past week. She denies any sob or chest pain.  Assessment and Plan The patient's blood pressure is adequately controlled. Recommend continuing amlodipine 10mg  qd, lisinopril-hctz 20-25mg  qd.

## 2018-02-05 NOTE — Assessment & Plan Note (Signed)
The patient states that she has bene have a 1-2 week history of dizziness. She notes falling 5 days ago. Prior to her fall she stated that she felt dizzy and that she was going to lose control of her balance. She did not hit her head during the fall.   Assessment and plan  Orthostatic vital signs were negative. She does not have any hypoglycemia readings on chart. Ordered cbc to check for anemia as a possible cause. Patient has been on doxepin and gabapentin which are centrally acting medications that can also be contributing to the patients ams. Will consider discontinuing these medications if no other cause is found.

## 2018-02-05 NOTE — Assessment & Plan Note (Signed)
The patient's last a1c=6.3 in December 2019. The patient's home blood glucose measurements over the past month have ranged 96-193. The patient is currently taking metformin 500mg  bid and sitagliptin 100mg  qd. She was previously on lantus 4u qd which was discontinued one week ago. The patient has not had significant weight changes.   Assessment and plan  The patient's diabetes is currently well controlled and therefore recommend her continue on metformin 500mg  bid and sitagliptin 100mg  qd.

## 2018-02-05 NOTE — Patient Instructions (Signed)
It was a pleasure to see you today [name].   The trauma to your left pointer finger should continue to improve. Please continue to soak it in hydrogen peroxide.  Please make sure you drink plenty of water Please get your bone density scan completed  If you have any questions or concerns, please call our clinic at [phone] between 9am-5pm and after hours call [phone] and ask for the internal medicine resident on call. If you feel you are having a medical emergency please call 911.   Thank you, we look forward to help you remain healthy!  Lars Mage, MD Internal Medicine PGY2

## 2018-02-05 NOTE — Assessment & Plan Note (Signed)
Refused influenza A vaccination

## 2018-02-05 NOTE — Assessment & Plan Note (Signed)
The patient is currently taking levothyroxine 146mcg qd. Last tsh=3.0 in November 2018.   Assessment and plan  Will recheck tsh level and determine if need to adjust dose.

## 2018-02-06 LAB — CBC
Hematocrit: 31.7 % — ABNORMAL LOW (ref 34.0–46.6)
Hemoglobin: 10.1 g/dL — ABNORMAL LOW (ref 11.1–15.9)
MCH: 26.8 pg (ref 26.6–33.0)
MCHC: 31.9 g/dL (ref 31.5–35.7)
MCV: 84 fL (ref 79–97)
PLATELETS: 228 10*3/uL (ref 150–450)
RBC: 3.77 x10E6/uL (ref 3.77–5.28)
RDW: 15.2 % (ref 11.7–15.4)
WBC: 6.8 10*3/uL (ref 3.4–10.8)

## 2018-02-06 LAB — TSH: TSH: 3.91 u[IU]/mL (ref 0.450–4.500)

## 2018-02-08 NOTE — Progress Notes (Signed)
Internal Medicine Clinic Attending  Case discussed with Dr. Chundi at the time of the visit.  We reviewed the resident's history and exam and pertinent patient test results.  I agree with the assessment, diagnosis, and plan of care documented in the resident's note. 

## 2018-02-09 ENCOUNTER — Other Ambulatory Visit: Payer: Self-pay | Admitting: Internal Medicine

## 2018-02-09 DIAGNOSIS — D649 Anemia, unspecified: Secondary | ICD-10-CM

## 2018-02-09 NOTE — Progress Notes (Signed)
Placed future orders for iron studies

## 2018-02-10 ENCOUNTER — Telehealth: Payer: Self-pay | Admitting: *Deleted

## 2018-02-10 NOTE — Telephone Encounter (Signed)
Pt called / informed "drop in her hemoglobin which can explain her recent dizziness.  to come back for a lab only visit to check her iron level " per [place]. Lab appt scheduled Feb 3 @ 1330 PM.

## 2018-02-10 NOTE — Telephone Encounter (Signed)
-----   Message from Lars Mage, MD sent at 02/09/2018 11:04 AM EST ----- Hello Glenda!  Hope you had a wonderful weekend. Ms. Lievanos had an acute drop in her hemoglobin which can ezplain her recent dizziness. I tried to call the above patient regarding telling her to come back for a lab only visit to check her iron level and was unfortunately not able to reach her. I think the number on file might be incorrect. If you do not mind could you call her and tell her to return for a lab only visit please? Lab orders have already been placed. Thanks!  Best,  [employer]

## 2018-02-10 NOTE — Telephone Encounter (Signed)
Thank you for following up.

## 2018-02-15 ENCOUNTER — Other Ambulatory Visit: Payer: Self-pay

## 2018-02-17 ENCOUNTER — Ambulatory Visit (INDEPENDENT_AMBULATORY_CARE_PROVIDER_SITE_OTHER): Payer: Commercial Managed Care - HMO | Admitting: Orthopaedic Surgery

## 2018-02-17 NOTE — Addendum Note (Signed)
Addended by: Hulan Fray on: 02/17/2018 05:35 PM   Modules accepted: Orders

## 2018-02-19 ENCOUNTER — Other Ambulatory Visit: Payer: Self-pay | Admitting: Internal Medicine

## 2018-02-19 DIAGNOSIS — M5431 Sciatica, right side: Secondary | ICD-10-CM

## 2018-02-19 DIAGNOSIS — E039 Hypothyroidism, unspecified: Secondary | ICD-10-CM

## 2018-02-19 DIAGNOSIS — M5432 Sciatica, left side: Principal | ICD-10-CM

## 2018-03-03 ENCOUNTER — Other Ambulatory Visit: Payer: Self-pay | Admitting: Internal Medicine

## 2018-03-08 NOTE — Addendum Note (Signed)
Addended by: Hulan Fray on: 03/08/2018 08:34 PM   Modules accepted: Orders

## 2018-03-09 ENCOUNTER — Other Ambulatory Visit: Payer: Self-pay | Admitting: Internal Medicine

## 2018-03-09 DIAGNOSIS — E1151 Type 2 diabetes mellitus with diabetic peripheral angiopathy without gangrene: Secondary | ICD-10-CM

## 2018-03-22 ENCOUNTER — Telehealth: Payer: Self-pay

## 2018-03-22 ENCOUNTER — Other Ambulatory Visit: Payer: Self-pay | Admitting: Internal Medicine

## 2018-03-22 NOTE — Telephone Encounter (Signed)
Did [name] tell the patient to increase the number of times she tested?

## 2018-03-22 NOTE — Telephone Encounter (Signed)
Call made to [facility] pt should not be out of test strips until April-pt states she didn't get the last delivery. Pt states she was instructed to increase testing to 4 times a day.  She is also requesting a refill (90-day supply) on her celebrex.    Will send info to pcp to consider new rx for test strips testing 4 times a day and a 90-day refill on celebrex.  Please advise.[name], Darlene Cassady3/9/202012:06 PM

## 2018-03-22 NOTE — Telephone Encounter (Signed)
ACCU-CHEK AVIVA PLUS test strip, and celecoxib needs refill @  [place], [place] [phone] (Phone) [phone] (Fax)

## 2018-03-22 NOTE — Telephone Encounter (Signed)
Please have patient schedule a follow-up appointment with me.  I am in [facility] month of March

## 2018-03-22 NOTE — Telephone Encounter (Signed)
I do not think I suggested she check more often. It appears her insulin has been stopped so she is no longer on diabetes medicine that puts her at risk for hypoglycemia and her diabetes is well controlled. Her insurance may not pay for more than 1 strip per day.

## 2018-03-23 NOTE — Telephone Encounter (Signed)
Called pt - no answer; left message to call the office for an appt. I will send message to the front desk.

## 2018-03-29 ENCOUNTER — Other Ambulatory Visit: Payer: Self-pay

## 2018-03-29 ENCOUNTER — Other Ambulatory Visit: Payer: Self-pay | Admitting: Internal Medicine

## 2018-03-29 ENCOUNTER — Encounter: Payer: Self-pay | Admitting: *Deleted

## 2018-03-29 ENCOUNTER — Other Ambulatory Visit: Payer: Self-pay | Admitting: *Deleted

## 2018-03-29 DIAGNOSIS — F332 Major depressive disorder, recurrent severe without psychotic features: Secondary | ICD-10-CM

## 2018-03-29 DIAGNOSIS — E669 Obesity, unspecified: Secondary | ICD-10-CM

## 2018-03-29 DIAGNOSIS — E039 Hypothyroidism, unspecified: Secondary | ICD-10-CM

## 2018-03-29 DIAGNOSIS — E1169 Type 2 diabetes mellitus with other specified complication: Secondary | ICD-10-CM

## 2018-03-29 MED ORDER — METFORMIN HCL ER 500 MG PO TB24: 500.0000 mg | ORAL_TABLET | Freq: Two times a day (BID) | ORAL | 1 refills | Status: DC

## 2018-03-29 MED ORDER — DOXEPIN HCL 150 MG PO CAPS: 150.0000 mg | ORAL_CAPSULE | Freq: Every day | ORAL | 0 refills | Status: DC

## 2018-03-29 MED ORDER — LEVOTHYROXINE SODIUM 112 MCG PO TABS: ORAL_TABLET | ORAL | 0 refills | Status: DC

## 2018-03-29 NOTE — Telephone Encounter (Signed)
doxepin (SINEQUAN) 150 MG capsule  metFORMIN (GLUCOPHAGE-XR) 500 MG 24 hr tablet  levothyroxine (SYNTHROID, LEVOTHROID) 112 MCG tablet,   celebrex and clopidogrel,   refill request @   [place], [place] [phone] (Phone) [phone] (Fax)   Pt would like a call back.

## 2018-03-29 NOTE — Patient Outreach (Signed)
[facility]) Care Management  [place]  03/29/2018   [name] 26-Nov-1950 [id number]   [place] Every Other Month Outreach   Referral Date:  06/09/2017 Referral Source:  Transfer from [place]  Reason for Referral:  Continued Disease Management Education Insurance:  [facility] Medicare    Outreach Attempt:  Successful telephone outreach to patient for follow up.  HIPAA verified with patient.  Patient stating she is doing better, trying to avoid others and stay healthy.  Stating she was out of town the whole month of February.  Reports fall in January related to dizziness and probably low hemoglobin.  States her dizziness is better.  Fasting blood sugar was 105 today with ranges of 80-150's.  Denies any hypoglycemic episodes less than 70's.  States she has discontinued Lantus and is taking the Januvia.  Continues to smoke and is hoping to decrease soon.  Encounter Medications:  Outpatient Encounter Medications as of 03/29/2018  Medication Sig Note  . ACCU-CHEK AVIVA PLUS test strip USE 3 TIMES DAILY TO CHECK BLOOD SUGAR.   Marland Kitchen ACCU-CHEK SOFTCLIX LANCETS lancets CHECK BLOOD SUGAR UP TO 4 TIMES DAILY   . acetaminophen (TYLENOL) 500 MG tablet Take 2 tablets (1,000 mg total) by mouth every 8 (eight) hours as needed for moderate pain.   . Alcohol Swabs (B-D SINGLE USE SWABS REGULAR) PADS USE AS NEEDED AS DIRECTED   . amLODipine (NORVASC) 10 MG tablet    . BESIVANCE 0.6 % SUSP SHAKE LQ AND INT 1 GTT IN OD TID   . Blood Glucose Calibration (ACCU-CHEK AVIVA) SOLN    . doxepin (SINEQUAN) 150 MG capsule Take 1 capsule (150 mg total) by mouth at bedtime.   . DUREZOL 0.05 % EMUL PLACE 1 DROP IN RIGHT EYE 3 TIMES DAILY   . furosemide (LASIX) 20 MG tablet Take 10 mg by mouth every other day.   . gabapentin (NEURONTIN) 400 MG capsule TAKE 1 CAPSULE EVERY MORNING, 1 CAPSULE AT LUNCH, AND 2 CAPSULES AT BEDTIME   . JANUVIA 100 MG tablet TAKE 1 TABLET (100 MG  TOTAL) BY MOUTH DAILY.   Marland Kitchen levothyroxine (SYNTHROID, LEVOTHROID) 112 MCG tablet Take 1 tablet daily by mouth 1 hour  before breakfast   . lisinopril-hydrochlorothiazide (PRINZIDE,ZESTORETIC) 20-25 MG tablet TAKE 1 TABLET EVERY DAY   . metFORMIN (GLUCOPHAGE-XR) 500 MG 24 hr tablet Take 1 tablet (500 mg total) by mouth 2 (two) times daily. Before lunch and supper   . OVER THE COUNTER MEDICATION Take 500 mg by mouth daily. Potassium 523m   . pantoprazole (PROTONIX) 40 MG tablet TAKE 1 TABLET TWICE DAILY   . Polyethyl Glycol-Propyl Glycol (SYSTANE OP) Place 1 drop into both eyes daily.   .Marland KitchenamLODipine (NORVASC) 10 MG tablet Take 1 tablet (10 mg total) by mouth daily. 10/01/2017: Reports continues to take  . amLODipine (NORVASC) 10 MG tablet TAKE 1 TABLET EVERY DAY 39/67/5916 Duplicate  . atorvastatin (LIPITOR) 40 MG tablet Take 1 tablet (40 mg total) by mouth daily. 01/14/2018: Reports not taking  . Cholecalciferol (VITAMIN D3) 400 units CAPS Take 400 Units by mouth daily. (Patient not taking: Reported on 01/14/2018) 07/07/2017: Not currently taking  . Insulin Syringe-Needle U-100 31G X 15/64" 0.3 ML MISC Use to inject insulin daily (Patient not taking: Reported on 03/29/2018)   . varenicline (CHANTIX STARTING MONTH PAK) 0.5 MG X 11 & 1 MG X 42 tablet Take one 0.5 mg tablet by mouth once daily for 3  days, then increase to one 0.5 mg tablet twice daily for 4 days, then increase to one 1 mg tablet twice daily. (Patient not taking: Reported on 01/14/2018) 10/01/2017: Patient has not started this medication yet  . [DISCONTINUED] doxepin (SINEQUAN) 150 MG capsule TAKE 1 CAPSULE AT BEDTIME   . [DISCONTINUED] levothyroxine (SYNTHROID, LEVOTHROID) 112 MCG tablet TAKE 1 TABLET ONE TIME DAILY BEFORE BREAKFAST   . [DISCONTINUED] metFORMIN (GLUCOPHAGE-XR) 500 MG 24 hr tablet TAKE 1 TABLET TWICE DAILY BEFORE LUNCH AND SUPPER    No facility-administered encounter medications on file as of 03/29/2018.     Functional Status:   In your present state of health, do you have any difficulty performing the following activities: 02/05/2018 01/25/2018  Hearing? N N  Vision? N N  Difficulty concentrating or making decisions? N N  Walking or climbing stairs? Y N  Comment - -  Dressing or bathing? N N  Comment - -  Doing errands, shopping? [place] and eating ? - -  Using the Toilet? - -  In the past six months, have you accidently leaked urine? - -  Do you have problems with loss of bowel control? - -  Managing your Medications? - -  Managing your Finances? - -  Housekeeping or managing your Housekeeping? - -  Some recent data might be hidden    Fall/Depression Screening: Fall Risk  03/29/2018 02/05/2018 01/25/2018  Falls in the past year? 1 1 1   Comment - - -  Number falls in past yr: 1 1 1   Comment last fall January 2020 - -  Injury with Fall? 1 1 1   Comment - - -  Risk Factor Category  High Risk (2 or more Points) - -  Risk for fall due to : History of fall(s);Impaired vision;Medication side effect;Impaired balance/gait;Impaired mobility Impaired balance/gait;Impaired mobility;History of fall(s) History of fall(s);Impaired balance/gait;Impaired mobility;Medication side effect  Risk for fall due to: Comment - - sugar dropped   Follow up Education provided;Falls evaluation completed;Falls prevention discussed Falls prevention discussed Falls prevention discussed   PHQ 2/9 Scores 02/05/2018 01/25/2018 12/31/2017 12/25/2017 10/05/2017 08/21/2017 07/20/2017  PHQ - 2 Score 2 2 2 2 2 2 2   PHQ- 9 Score 7 9 6 8 5 5 8    THN CM Care Plan Problem One     Most Recent Value  Care Plan Problem One  Knowledge deficit related to self care management of diabetes and depression  Role Documenting the Problem One  [place] for Problem One  Active  THN Long Term Goal   Patient will maintain A1C of 7 or below in the next 90 days.  THN Long Term Goal Start Date  03/29/18  Interventions for Problem  One Long Term Goal  Current care plan and goals reviewed and didscussed with patient, encouraged to keep and attend medical appointments, encouraged to schedule primary care medical appointment as soon as restrictions are lifted to follow up with lab work, encouraged patient to request home health therapy referral now that she is in town, reviewed medications and encouraged medication compliance, reviewed signs and symtoms of hypoglycemia, fall precautions and preventions reviewed and discussed, discussed and encouraged smoking cessation, encouraged patient to discuss medication treatment for smoking cessation with primary care provider  [facility] CM Short Term Goal #1   Patient will report requesting podiatrist referral from primary care provider in the next 90 days.  THN CM Short Term Goal #1 Start  Date  03/29/18  THN CM Short Term Goal #1 Met Date  03/29/18  Interventions for Short Term Goal #1  Discussed importance of foot care for diabetics, encouraged patient to contact primary care for podiatrist referral, discussed dangers of cutting her own toenails  THN CM Short Term Goal #2   Patient will report taking Januvia and stopping Lantus injections in the next 30 days.  THN CM Short Term Goal #2 Start Date  01/14/18  [facility] CM Short Term Goal #2 Met Date  03/29/18     Appointments:  Attended appointment with primary care provider on 02/05/2018 and needs to schedule follow up when office is accepting appointments.    Plan:  RN Health Coach will make next telephone outreach to patient within the month of June.  RN Health Coach will send primary care provider quarterly update.   [place] Coach [phone] [email or web address]

## 2018-03-30 ENCOUNTER — Other Ambulatory Visit: Payer: Self-pay | Admitting: *Deleted

## 2018-03-30 MED ORDER — AMLODIPINE BESYLATE 10 MG PO TABS: 10.0000 mg | ORAL_TABLET | Freq: Every day | ORAL | 0 refills | Status: DC

## 2018-04-19 ENCOUNTER — Other Ambulatory Visit: Payer: Self-pay | Admitting: Internal Medicine

## 2018-04-19 DIAGNOSIS — E039 Hypothyroidism, unspecified: Secondary | ICD-10-CM

## 2018-04-19 NOTE — Telephone Encounter (Signed)
Refill Request   ACCU-CHEK AVIVA PLUS test strip  [facility] [place], [place] [address]  levothyroxine (SYNTHROID, LEVOTHROID) 112 MCG tablet  [place], [place]

## 2018-04-20 MED ORDER — LEVOTHYROXINE SODIUM 112 MCG PO TABS: ORAL_TABLET | ORAL | 1 refills | Status: DC

## 2018-04-20 MED ORDER — GLUCOSE BLOOD VI STRP: ORAL_STRIP | 1 refills | Status: AC

## 2018-05-04 ENCOUNTER — Other Ambulatory Visit: Payer: Self-pay

## 2018-05-19 ENCOUNTER — Other Ambulatory Visit: Payer: Self-pay | Admitting: Student in an Organized Health Care Education/Training Program

## 2018-06-01 ENCOUNTER — Other Ambulatory Visit: Payer: Self-pay | Admitting: Internal Medicine

## 2018-06-01 DIAGNOSIS — F332 Major depressive disorder, recurrent severe without psychotic features: Secondary | ICD-10-CM

## 2018-06-01 NOTE — Telephone Encounter (Signed)
doxepin (SINEQUAN) 150 MG capsule, refill request @  [place], [place] [phone] (Phone) [phone] (Fax)

## 2018-06-08 ENCOUNTER — Encounter: Payer: Self-pay | Admitting: *Deleted

## 2018-06-08 ENCOUNTER — Telehealth: Payer: Self-pay | Admitting: *Deleted

## 2018-06-08 NOTE — Telephone Encounter (Signed)
I called Ms [place]oneau, per Dr Thea Gist instructions, to schedule a lab only visit for her pending labs, IIBC and Ferritin.  I encouraged her to come in at her earliest convenience. Patient has to plan transportation. I explained she could call the day ahead to schedule a lab only appoint. Her next office visit is scheduled for  07-09-18 at 13:15.  At the end of the conversation, Ms Dearmond requested to speak with the triage nurse.  I told her I would ask the nurse to return her call.   Maryan Rued, PBT 06-08-2018 14:01

## 2018-06-09 ENCOUNTER — Telehealth: Payer: Self-pay | Admitting: *Deleted

## 2018-06-09 NOTE — Telephone Encounter (Signed)
At next appt pt would like to discuss: K+ Kidney function Neuropathy Cramping of limbs

## 2018-06-09 NOTE — Telephone Encounter (Signed)
Happy to discuss at appt. Thanks for letting me know

## 2018-06-23 ENCOUNTER — Other Ambulatory Visit: Payer: Self-pay | Admitting: *Deleted

## 2018-06-23 NOTE — Patient Outreach (Signed)
[facility]) Care Management  06/23/2018  ANTONELLA UPSON 27-Dec-1950 [id number]   [place] Other MonthOutreach  Referral Date:06/09/2017 Referral Source:Transfer from [place] Reason for Referral:Continued Disease Management Education Insurance:[employer] Medicare   Outreach Attempt:  Outreach attempt #1 to patient for follow up. No answer. RN Health Coach left HIPAA compliant voicemail message along with contact information.  Plan:  RN Health Coach will make another outreach attempt to patient within the month of June  [place] [phone] [email or web address]

## 2018-06-28 NOTE — Progress Notes (Deleted)
   CC: ***  HPI:  Ms.Deaisa B Creer is a 68 y.o.   Past Medical History:  Diagnosis Date  . Allergy   . Anal fissure   . Anxiety   . Cataract   . Chronic headaches   . Depression   . Diverticulosis   . GERD (gastroesophageal reflux disease)   . Glaucoma   . HLD (hyperlipidemia)   . Hypertension   . Hypothyroidism   . PAD (peripheral artery disease) ([place])   . PUD (peptic ulcer disease)   . TIA (transient ischemic attack)    Review of Systems:  ***  Physical Exam:  There were no vitals filed for this visit. ***  Assessment & Plan:   See Encounters Tab for problem based charting.  Patient {GC/GE:[id number]::"discussed with","seen with"} Dr. {NAMES:3044014::"Butcher","Granfortuna","E. Hoffman","Klima","Mullen","Narendra","Raines","Vincent"}

## 2018-06-29 ENCOUNTER — Encounter: Payer: Medicare HMO | Admitting: Internal Medicine

## 2018-07-01 ENCOUNTER — Other Ambulatory Visit: Payer: Self-pay | Admitting: Internal Medicine

## 2018-07-02 NOTE — Telephone Encounter (Signed)
Scheduled to w/pcp on 6/23

## 2018-07-05 ENCOUNTER — Encounter: Payer: Self-pay | Admitting: *Deleted

## 2018-07-05 ENCOUNTER — Other Ambulatory Visit: Payer: Self-pay | Admitting: *Deleted

## 2018-07-05 NOTE — Patient Outreach (Signed)
[facility]) Care Management  [place]  07/05/2018   MERRIAM BRANDNER [birth year] [id number]   [place] Every Other Month Outreach   Referral Date:  06/09/2017 Referral Source:  Transfer from [place]  Reason for Referral:  Continued Disease Management Education Insurance:  [facility] Medicare   Outreach Attempt:  Successful telephone outreach to patient for follow up.  HIPAA verified with patient.  Patient very sad and depressed.  Reporting she is not feeling like herself and is having increase in falls.  States she has fallen 4 times in the past month; feels like these are black out spells.  Ambulating with a cane, does not have walker available.  Conversation with patient is difficult because patient is stating she knows what she wants to say but just can not get the words out in her mind, but can answer questions.  States she has just been staying in the home mainly and that she is more afraid of going out and falling in public instead of the pandemic that is going on.  Patient reporting she has been having constant arm and hand tremors with full body tremors at times. Reports she has made providers aware of black outs but did not go to emergency room or urgent care after falls (unsure but thinks she may have hit her head a few times).  States she has not monitored her blood sugars during black outs and has only been monitoring blood sugars once a day.  Fasting blood sugar this morning was 169.  Reports a couple hypoglycemic episodes last month.  Discussed [place] Work referral for depression resources and patient verbally agrees.  Patient reporting weakness and inability to perform ADLs independently, states her neighbor has been helping.  Encouraged patient to discuss home health physical therapy and home health aid with primary care provider at appointment tomorrow.  Encounter Medications:  Outpatient Encounter Medications as of 07/05/2018   Medication Sig Note  . ACCU-CHEK AVIVA PLUS test strip USE 3 TIMES DAILY TO CHECK BLOOD SUGAR.   Marland Kitchen ACCU-CHEK SOFTCLIX LANCETS lancets CHECK BLOOD SUGAR UP TO 4 TIMES DAILY   . acetaminophen (TYLENOL) 500 MG tablet Take 2 tablets (1,000 mg total) by mouth every 8 (eight) hours as needed for moderate pain.   . Alcohol Swabs (B-D SINGLE USE SWABS REGULAR) PADS USE AS NEEDED AS DIRECTED   . amLODipine (NORVASC) 10 MG tablet Take 1 tablet (10 mg total) by mouth daily.   Marland Kitchen doxepin (SINEQUAN) 150 MG capsule TAKE 1 CAPSULE (150 MG TOTAL) BY MOUTH AT BEDTIME.   . DUREZOL 0.05 % EMUL PLACE 1 DROP IN RIGHT EYE 3 TIMES DAILY   . furosemide (LASIX) 20 MG tablet TAKE 1 TABLET EVERY DAY   . gabapentin (NEURONTIN) 400 MG capsule TAKE 1 CAPSULE EVERY MORNING, 1 CAPSULE AT LUNCH, AND 2 CAPSULES AT BEDTIME   . glucose blood (ACCU-CHEK AVIVA PLUS) test strip Use to check blood sugar 3 times daily. 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 JANUVIA 100 MG tablet TAKE 1 TABLET (100 MG TOTAL) BY MOUTH DAILY.   Marland Kitchen levothyroxine (SYNTHROID, LEVOTHROID) 112 MCG tablet Take 1 tablet daily by mouth 1 hour  before breakfast   . lisinopril-hydrochlorothiazide (PRINZIDE,ZESTORETIC) 20-25 MG tablet TAKE 1 TABLET EVERY DAY   . metFORMIN (GLUCOPHAGE-XR) 500 MG 24 hr tablet Take 1 tablet (500 mg total) by mouth 2 (two) times daily. Before lunch and supper   . OVER THE COUNTER MEDICATION  Take 500 mg by mouth daily. Potassium 500mg    . pantoprazole (PROTONIX) 40 MG tablet TAKE 1 TABLET TWICE DAILY   . Polyethyl Glycol-Propyl Glycol (SYSTANE OP) Place 1 drop into both eyes daily.   Marland Kitchen amLODipine (NORVASC) 10 MG tablet Take 1 tablet (10 mg total) by mouth daily. 10/01/2017: Reports continues to take  . atorvastatin (LIPITOR) 40 MG tablet Take 1 tablet (40 mg total) by mouth daily. 01/14/2018: Reports not taking  . BESIVANCE 0.6 % SUSP SHAKE LQ AND INT 1 GTT IN OD TID   . Blood Glucose Calibration (ACCU-CHEK AVIVA) SOLN    . Cholecalciferol  (VITAMIN D3) 400 units CAPS Take 400 Units by mouth daily. (Patient not taking: Reported on 01/14/2018) 07/07/2017: Not currently taking  . Insulin Syringe-Needle U-100 31G X 15/64" 0.3 ML MISC Use to inject insulin daily (Patient not taking: Reported on 03/29/2018)   . LANTUS 100 UNIT/ML injection INJECT 12 UNITS SUBCUTANEOUSLY EVERY DAY BEFORE BREAKFAST (DISCARD VIAL 28 DAYS AFTER OPENING) (Patient not taking: Reported on 07/05/2018)   . varenicline (CHANTIX STARTING MONTH PAK) 0.5 MG X 11 & 1 MG X 42 tablet Take one 0.5 mg tablet by mouth once daily for 3 days, then increase to one 0.5 mg tablet twice daily for 4 days, then increase to one 1 mg tablet twice daily. (Patient not taking: Reported on 01/14/2018) 10/01/2017: Patient has not started this medication yet   No facility-administered encounter medications on file as of 07/05/2018.     Functional Status:  In your present state of health, do you have any difficulty performing the following activities: 07/05/2018 02/05/2018  Hearing? N N  Vision? N N  Difficulty concentrating or making decisions? Y N  Comment difficulty remembering things and focusing -  Walking or climbing stairs? Y Y  Comment frequent falls and trouble keeping balance -  Dressing or bathing? Y N  Comment uanble to bath self totally at this time -  Doing errands, shopping? Y N  Comment gait is really unsteady to run errands -  Conservation officer, nature and eating ? Y -  Comment doest feel safe to stand to prepare food at this time -  Using the Toilet? N -  In the past six months, have you accidently leaked urine? Y -  Comment some incontinence -  Do you have problems with loss of bowel control? N -  Managing your Medications? N -  Managing your Finances? N -  Housekeeping or managing your Housekeeping? Y -  Comment unable to clean at this time -  Some recent data might be hidden    Fall/Depression Screening: Fall Risk  07/05/2018 03/29/2018 02/05/2018  Falls in the past year? 1 1 1    Comment - - -  Number falls in past yr: 1 1 1   Comment about 4 falls in the month of June 2020 last fall January 2020 -  Injury with Fall? 1 1 1   Comment - - -  Risk Factor Category  High Risk (2 or more Points) High Risk (2 or more Points) -  Risk for fall due to : History of fall(s);Impaired balance/gait;Impaired mobility;Medication side effect History of fall(s);Impaired vision;Medication side effect;Impaired balance/gait;Impaired mobility Impaired balance/gait;Impaired mobility;History of fall(s)  Risk for fall due to: Comment - - -  Follow up Falls evaluation completed;Education provided;Falls prevention discussed Education provided;Falls evaluation completed;Falls prevention discussed Falls prevention discussed   PHQ 2/9 Scores 02/05/2018 01/25/2018 12/31/2017 12/25/2017 10/05/2017 08/21/2017 07/20/2017  PHQ - 2 Score 2 2  2 2 2 2 2   PHQ- 9 Score 7 9 6 8 5 5 8    THN CM Care Plan Problem One     Most Recent Value  Care Plan Problem One  Knowledge deficit related to self care management of diabetes and depression  Role Documenting the Problem One  [place] for Problem One  Active  THN Long Term Goal   Patient will maintain A1C of 7 or below in the next 90 days.  THN Long Term Goal Start Date  07/05/18  Interventions for Problem One Long Term Goal  Care plan reviewed and discussed with patient, encouraged patient to monitor blood sugars at least daily and when she is not feeling well and having falls/black out spells, fall precautions and preventions reviewed and discussed with patient, encouraged well balanced diabetic diet with patient  [facility] CM Short Term Goal #1   Patient will attend appointment with primary care provider on 07/06/2018  [facility] CM Short Term Goal #1 Start Date  07/05/18  Interventions for Short Term Goal #1  Encouraged patient to attend appointment with primary care provider, confirmed patient is aware of appointment, confirmed patient has transportation to  appointment, encouraged patient to discuss falls and black out spells with provider, encouraged patient to request home health therapy and aide  [facility] CM Short Term Goal #2   Patient will report being less depressed within the next 30 days  THN CM Short Term Goal #2 Start Date  07/05/18  Interventions for Short Term Goal #2  [facility] Social Work referral placed to help with depression and depressin resources, encouraged patient to work with [facility] Social Worker, discussed importance of managing depression in health maintanence, encouraged patient to discuss depression with primary care provider     Appointments: Confirmed patient is aware of appointment with primary care provider on 07/06/2018 and plans to attend appointment.  States she is not going to cancel appointment and plans to attend right now. Verbalizes she has private vehicle transportation to appointment.  Encouraged patient to keep and attend appointment.  Plan: RN Health Coach will place [facility] Social Work referral for depression resources. RN Health Coach will route quarterly update to primary care provider with request for home health nurse/physical therapy/aide. RN Health Coach will make another telephone outreach to patient within the month of July.  [place] [phone] [email or web address]

## 2018-07-05 NOTE — Progress Notes (Signed)
   CC: Tremors and lightheadedness  HPI:  [name] is a 68 y.o. chronic htn, diabetes mellitus 2, hypothyroidism, mdd who presents for follow up of chronic problems with new complaint of tremors and lightheadedness. Please see problem based charting for evaluation, assessment, and plan.  Past Medical History:  Diagnosis Date  . Allergy   . Anal fissure   . Anxiety   . Cataract   . Chronic headaches   . Depression   . Diverticulosis   . GERD (gastroesophageal reflux disease)   . Glaucoma   . HLD (hyperlipidemia)   . Hypertension   . Hypothyroidism   . PAD (peripheral artery disease) ([place])   . PUD (peptic ulcer disease)   . TIA (transient ischemic attack)    Review of Systems:    Review of Systems  HENT: Positive for hearing loss.   Respiratory: Positive for shortness of breath. Negative for cough.   Gastrointestinal: Negative for abdominal pain, nausea and vomiting.  Genitourinary: Negative for dysuria.  Neurological: Positive for dizziness, tremors, speech change and seizures.  Psychiatric/Behavioral: Positive for depression.   Physical Exam:  Vitals:   07/06/18 1315  BP: (!) 146/81  Pulse: 88  Temp: 98.3 F (36.8 C)  TempSrc: Oral  SpO2: 100%  Weight: 209 lb 8 oz (95 kg)  Height: 5\' 1"  (1.549 m)   Physical Exam  Constitutional: She is oriented to person, place, and time. She appears well-developed and well-nourished. No distress.  HENT:  Head: Normocephalic and atraumatic.  Eyes: Conjunctivae are normal.  Cardiovascular: Normal rate, regular rhythm and normal heart sounds.  Respiratory: Effort normal and breath sounds normal. No respiratory distress. She has no wheezes.  GI: Soft. Bowel sounds are normal. She exhibits no distension. There is no abdominal tenderness.  Musculoskeletal:        General: No edema.  Neurological: She is oriented to person, place, and time. No cranial nerve deficit. She exhibits normal muscle tone. Coordination normal.   Skin: She is not diaphoretic.  Psychiatric: Her affect is labile. Her speech is not rapid and/or pressured. She is slowed. She exhibits a depressed mood.  Word finding difficulty     Assessment & Plan:   See Encounters Tab for problem based charting.  Patient discussed with Dr. Angelia Mould

## 2018-07-06 ENCOUNTER — Other Ambulatory Visit: Payer: Self-pay

## 2018-07-06 ENCOUNTER — Ambulatory Visit (INDEPENDENT_AMBULATORY_CARE_PROVIDER_SITE_OTHER): Payer: Medicare HMO | Admitting: Internal Medicine

## 2018-07-06 ENCOUNTER — Encounter: Payer: Self-pay | Admitting: Internal Medicine

## 2018-07-06 VITALS — BP 146/81 | HR 88 | Temp 98.3°F | Ht 61.0 in | Wt 209.5 lb

## 2018-07-06 DIAGNOSIS — R251 Tremor, unspecified: Secondary | ICD-10-CM | POA: Diagnosis not present

## 2018-07-06 DIAGNOSIS — H9190 Unspecified hearing loss, unspecified ear: Secondary | ICD-10-CM | POA: Diagnosis not present

## 2018-07-06 DIAGNOSIS — I10 Essential (primary) hypertension: Secondary | ICD-10-CM

## 2018-07-06 DIAGNOSIS — Z79899 Other long term (current) drug therapy: Secondary | ICD-10-CM

## 2018-07-06 DIAGNOSIS — R42 Dizziness and giddiness: Secondary | ICD-10-CM | POA: Diagnosis not present

## 2018-07-06 DIAGNOSIS — E039 Hypothyroidism, unspecified: Secondary | ICD-10-CM | POA: Diagnosis not present

## 2018-07-06 DIAGNOSIS — R6889 Other general symptoms and signs: Secondary | ICD-10-CM | POA: Diagnosis not present

## 2018-07-06 DIAGNOSIS — L609 Nail disorder, unspecified: Secondary | ICD-10-CM

## 2018-07-06 DIAGNOSIS — F332 Major depressive disorder, recurrent severe without psychotic features: Secondary | ICD-10-CM

## 2018-07-06 DIAGNOSIS — D649 Anemia, unspecified: Secondary | ICD-10-CM | POA: Diagnosis not present

## 2018-07-06 DIAGNOSIS — R569 Unspecified convulsions: Secondary | ICD-10-CM | POA: Diagnosis not present

## 2018-07-06 DIAGNOSIS — E1151 Type 2 diabetes mellitus with diabetic peripheral angiopathy without gangrene: Secondary | ICD-10-CM | POA: Diagnosis not present

## 2018-07-06 LAB — POCT GLYCOSYLATED HEMOGLOBIN (HGB A1C): Hemoglobin A1C: 6.2 % — AB (ref 4.0–5.6)

## 2018-07-06 LAB — GLUCOSE, CAPILLARY: Glucose-Capillary: 104 mg/dL — ABNORMAL HIGH (ref 70–99)

## 2018-07-06 NOTE — Assessment & Plan Note (Addendum)
Patient states that she has had a 61-month history of episodes where she has tremors, lightheadedness.  She states that each episode lasts approximately 1 minute.  She has episodes as occurring when she is in a standing position.  She states that she has some ringing in her ears and hearing loss at that time.  She feels very lightheaded and as if she is going to fall and has generalized tremors all over her body.  She had approximately 2 episodes of this this past week.  Per chart review the patient had similar symptoms in 2 she was evaluated in the ED.  CT head at that time was negative.  She was advised to follow-up with neurology, which she did not.  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  The patient is not on any medications that will significantly decrease seizure threshold.  She has not had any hypoglycemic events.  Of note, on Januvia and Metformin do not cause significant hypoglycemia.  Her TSH was also checked in a few months ago it was within normal limits 3.91.  The patient needs a work-up for her tremors.  She will likely need imaging of her brain and EEG.  -Urgent referral to neurology made, she is seen in 1 week -Follow-up with patient in 1 month

## 2018-07-06 NOTE — Patient Instructions (Signed)
It was a pleasure to see you today [name]. Please make the following changes:  -Please make sure to continue taking your medications -Please go to neurology appointment  -please follow up with [name] regarding mood symptoms  If you have any questions or concerns, please call our clinic at [phone] between 9am-5pm and after hours call [phone] and ask for the internal medicine resident on call. If you feel you are having a medical emergency please call 911.   Thank you, we look forward to help you remain healthy!  Lars Mage, MD Internal Medicine PGY2

## 2018-07-06 NOTE — Assessment & Plan Note (Signed)
  The patient's blood pressure during this visit was 146/81. The patient is currently taking lisinopril-htcz 20-25mg  qd and amlodipine 10mg  qd . His last blood pressure visits are   BP Readings from Last 3 Encounters:  07/06/18 (!) 146/81  02/05/18 135/85  01/25/18 (!) 156/84   States she occasionally misses doses of her hypertensive medication.  Assessment and Plan Patient's blood pressure is controlled we will continue on current regimen.  I have counseled patient to take her medication daily.

## 2018-07-06 NOTE — Assessment & Plan Note (Signed)
Referred to podiatry for patient's toenails to be cut.

## 2018-07-06 NOTE — Assessment & Plan Note (Signed)
  Patient's A1c during this visit was 6.2, glucose of 104.  The patient's blood 120-160's.  Patient is currently taking Januvia 100 mg daily and twice daily.  Lost 6 pounds over the past few months.  Assessment and plan The patient's diabetes is well controlled and therefore will continue on current regimen.

## 2018-07-06 NOTE — Assessment & Plan Note (Signed)
  The patient's phq9 score is currently this visit.  PHN has significant the patient has been very depressed.  Patient states that she has decreased appetite, sleep, is agitated. She is currently taking doxepin 150mg  qhs  Plan  -Advised patient to continue doxepin 150 mg at bedtime -Follow-up with behavioral health

## 2018-07-07 ENCOUNTER — Other Ambulatory Visit: Payer: Self-pay | Admitting: *Deleted

## 2018-07-07 ENCOUNTER — Telehealth: Payer: Self-pay | Admitting: *Deleted

## 2018-07-07 LAB — IRON AND TIBC
Iron Saturation: 8 % — CL (ref 15–55)
Iron: 33 ug/dL (ref 27–139)
Total Iron Binding Capacity: 429 ug/dL (ref 250–450)
UIBC: 396 ug/dL — ABNORMAL HIGH (ref 118–369)

## 2018-07-07 LAB — FERRITIN: Ferritin: 15 ng/mL (ref 15–150)

## 2018-07-07 NOTE — Patient Outreach (Signed)
[facility]) Care Management  [place] 07/06/2018  [name] [birth year] [id number]   CSW attempted initial phone outreach with pt and was unsuccessful. CSW left a HIPPA compliant voice message and will try again 3-4 business days per policy.   Eduard Clos, MSW, [place] Worker  [place] [phone]

## 2018-07-07 NOTE — Progress Notes (Signed)
Internal Medicine Clinic Attending  Case discussed with Dr. Chundi at the time of the visit.  We reviewed the resident's history and exam and pertinent patient test results.  I agree with the assessment, diagnosis, and plan of care documented in the resident's note. 

## 2018-07-07 NOTE — Telephone Encounter (Signed)
Glyn Ade, RN  with [facility] will take patient for [facility] PT and Aide. She will call back with SOC date. Hubbard Hartshorn, RN, BSN

## 2018-07-09 ENCOUNTER — Encounter: Payer: Medicare HMO | Admitting: Internal Medicine

## 2018-07-09 ENCOUNTER — Other Ambulatory Visit: Payer: Self-pay

## 2018-07-09 ENCOUNTER — Encounter: Payer: Self-pay | Admitting: *Deleted

## 2018-07-09 DIAGNOSIS — R251 Tremor, unspecified: Secondary | ICD-10-CM | POA: Diagnosis not present

## 2018-07-09 DIAGNOSIS — H409 Unspecified glaucoma: Secondary | ICD-10-CM | POA: Diagnosis not present

## 2018-07-09 DIAGNOSIS — H269 Unspecified cataract: Secondary | ICD-10-CM | POA: Diagnosis not present

## 2018-07-09 DIAGNOSIS — E1151 Type 2 diabetes mellitus with diabetic peripheral angiopathy without gangrene: Secondary | ICD-10-CM | POA: Diagnosis not present

## 2018-07-09 DIAGNOSIS — I10 Essential (primary) hypertension: Secondary | ICD-10-CM | POA: Diagnosis not present

## 2018-07-09 DIAGNOSIS — R1310 Dysphagia, unspecified: Secondary | ICD-10-CM | POA: Diagnosis not present

## 2018-07-09 DIAGNOSIS — F17210 Nicotine dependence, cigarettes, uncomplicated: Secondary | ICD-10-CM | POA: Diagnosis not present

## 2018-07-09 DIAGNOSIS — D649 Anemia, unspecified: Secondary | ICD-10-CM | POA: Diagnosis not present

## 2018-07-09 DIAGNOSIS — F329 Major depressive disorder, single episode, unspecified: Secondary | ICD-10-CM | POA: Diagnosis not present

## 2018-07-09 NOTE — Patient Outreach (Signed)
[facility]) Care Management  07/09/2018  [name] 1950/02/07 [id number]   CSW attempted to reach pt by phone and was unable. CSW left a HIPPA compliant voice message and will try again within the 3 call attempts in 10 business days policy.   Eduard Clos, MSW, [place] Worker  [place] [phone]

## 2018-07-09 NOTE — Patient Outreach (Signed)
[facility]) Care Management  07/09/2018  [name] [birth year] [id number]  Social work referral received from [place], Eduard Clos, regarding in-home assistance.  Unsuccessful outreach today; left voicemail message.  BSW will attempt to reach again within four business days.  Unsuccessful outreach letter mailed today by Marcie Bal.   Ronn Melena, BSW Social Worker [phone]

## 2018-07-12 ENCOUNTER — Encounter: Payer: Self-pay | Admitting: *Deleted

## 2018-07-12 NOTE — Telephone Encounter (Signed)
Glyn Ade with [facility] called to say [facility] for [facility] PT and Aide was 07/09/2018. Hubbard Hartshorn, RN, BSN

## 2018-07-13 ENCOUNTER — Ambulatory Visit (INDEPENDENT_AMBULATORY_CARE_PROVIDER_SITE_OTHER): Payer: Medicare HMO | Admitting: Dietician

## 2018-07-13 ENCOUNTER — Encounter: Payer: Self-pay | Admitting: Dietician

## 2018-07-13 ENCOUNTER — Encounter: Payer: Self-pay | Admitting: *Deleted

## 2018-07-13 ENCOUNTER — Other Ambulatory Visit: Payer: Self-pay

## 2018-07-13 ENCOUNTER — Ambulatory Visit (INDEPENDENT_AMBULATORY_CARE_PROVIDER_SITE_OTHER): Payer: Medicare HMO | Admitting: Licensed Clinical Social Worker

## 2018-07-13 DIAGNOSIS — E1151 Type 2 diabetes mellitus with diabetic peripheral angiopathy without gangrene: Secondary | ICD-10-CM | POA: Diagnosis not present

## 2018-07-13 DIAGNOSIS — Z713 Dietary counseling and surveillance: Secondary | ICD-10-CM | POA: Diagnosis not present

## 2018-07-13 DIAGNOSIS — Z6839 Body mass index (BMI) 39.0-39.9, adult: Secondary | ICD-10-CM

## 2018-07-13 DIAGNOSIS — F419 Anxiety disorder, unspecified: Secondary | ICD-10-CM

## 2018-07-13 DIAGNOSIS — F331 Major depressive disorder, recurrent, moderate: Secondary | ICD-10-CM

## 2018-07-13 DIAGNOSIS — R6889 Other general symptoms and signs: Secondary | ICD-10-CM | POA: Diagnosis not present

## 2018-07-13 LAB — GLUCOSE, CAPILLARY: Glucose-Capillary: 163 mg/dL — ABNORMAL HIGH (ref 70–99)

## 2018-07-13 NOTE — BH Specialist Note (Signed)
Integrated Behavioral Health Follow Up Visit  MRN: [id number] Name: [name]  Number of [place] Clinician visits: 1/6 Session Start time: 2:15   Session End time: 3:00 Total time: 45 minutes  Type of Service: Integrated Behavioral Health- Individual Interpretor:No.   SUBJECTIVE: [name] is a 68 y.o. femalewhom attended the session individually.  Patient was referred by [name] for anxiety and depression. Patient reports the following symptoms/concerns: recurrent episodes of depression and anxiety, health concerns, interpersonal relationship issues, paranoia, depersonalization, and panic attacks Duration of problem: Patient reported recurrent since childhood, and progressed most recently since March; Severity of problem: moderate  OBJECTIVE: Mood: Anxious and Affect: Appropriate Risk of harm to self or others: No plan to harm self or others  LIFE CONTEXT: Family and Social: Patient lives alone in a hotel room. Patient reported all of her siblings have passed, along with her husband. Patient was providing childcare to her grandchildren until the Coronavirus started, and now spends the 78 of her time alone in her "room". Patient feels she has lost her identity as a "caregiver".  School/Work: Patient reported she is on disability.  Self-Care: Isolates more often than what is recommended.  Life Changes: Pt is having to spend more time in her hotel room due to Covid. Patient reported having "seizure" or "blackouts" starting in February.   GOALS ADDRESSED: Patient will: 1.  Reduce symptoms of: agitation, anxiety, depression, mood instability, obsessions and stress  2.  Increase knowledge and/or ability of: coping skills, healthy habits, self-management skills and stress reduction  3.  Demonstrate ability to: Increase healthy adjustment to current life circumstances, Increase adequate support systems for patient/family and Increase motivation to  adhere to plan of care  INTERVENTIONS: Interventions utilized:  Motivational Interviewing, Supportive Counseling and Link to [employer] Assessments completed: assessed for SI, HI, and self-harm.  ASSESSMENT: Patient currently experiencing moderate levels of anxiety, panic attacks, paranoia around leaving her home, and depression. As the patient was walking to my office, she paused, backed against the wall, and appeared dizzy. Episode lasted around 20-30 seconds. Patient reported she did not hear me during the episode. Patient reported these episodes have been occurring more frequently over the past three months. Patient reported she accidentally hung up on the Neurologist (she thought they were a telemarketer) when they called her to schedule an appointment.   Patient reported that she is currently having a nurse and therapist come to her home on a weekly basis to help her. Patient reported that her living conditions are causing her significant stress, and the patient is having fears of "bed bugs" taking over her body while she is sleeping. Patient was provided with a list of housing options today to assist her with finding another home. Patient reported that even though she does not like her home (renting a room in a motel), she has many fears about moving.   Patient is reporting panic attacks at least once a week. Patient reported she has had anxiety, obsessions, and depression since she was a child (recurrent state). Patient identified there has been months at a time in her life when she could control/reduce the depression and anxiety. Patient seems to be effected currently due to lack of social interaction with her family (lacking purpose), and the recent "seizure-like" episodes she is experiencing.   Patient may benefit from weekly outpatient therapy.  PLAN: 1. Follow up with behavioral health clinician on : one week in person 2. Referral(S): Community Resources:  [place], [facility], [place]

## 2018-07-13 NOTE — Patient Outreach (Signed)
[facility]) Care Management  07/13/2018  [name] 17-Feb-1950 [id number]   Social work referral received from [place], Eduard Clos, regarding in-home assistance.  Second unsuccessful outreach today; left voicemail message.   Unsuccessful outreach letter mailed today by Marcie Bal on 07/09/18. Total of four unsuccessful outreach attempts by social work team.   Ronn Melena, [place] Worker [phone]

## 2018-07-13 NOTE — Patient Instructions (Addendum)
Ms. Parekh,   It was good to see you today.   Talk to your doctor about getting a pneumonia vaccine and flu vaccine when available  Please wear closed toed shoes.   Milk- soy or almond in in box is great or powdered milk  Good job getting fruits, vegetables, beans and nuts.   See letter for phone number to [facility])  Butch Penny [phone]

## 2018-07-13 NOTE — Progress Notes (Signed)
  Medical Nutrition Therapy:  Appt start time: 1510 end time:  1555. Visit # 3  Assessment: Primary concerns to be sure she is eating okay and diabetes/food not causing seizures. She reports small seizure today walking down short hallway between the counselor's and my office. She says it only lasted a short time and she is okay now. She agreed to call [facility] for an appointment. She says she is still 'managing' to eat well on food pantry items and friends help and her small food stamp allowance. She did agree to foods from our pantry today.  Her blood sugar was testing as soon as she arrived in my office and it was 163 mg/dl and she had eaten a sandwich 1-2 hours ago. She reports having lost her meter. Will request a prescription for a new one.   ANTHROPOMETRICS: Estimated body mass index is 39.58 kg/m as calculated from the following:   Height as of 07/06/18: 5\' 1"  (1.549 m).   Weight as of 07/06/18: 209 lb 8 oz (95 kg).  Wt Readings from Last 5 Encounters:  07/06/18 209 lb 8 oz (95 kg)  02/05/18 215 lb (97.5 kg)  01/25/18 213 lb 9.6 oz (96.9 kg)  01/01/18 213 lb (96.6 kg)  12/31/17 213 lb 8 oz (96.8 kg)   Blood sugar 163 1.5 hours after eating tomato sandwich with mayonnaise, whole wheat bread Lab Results  Component Value Date   HGBA1C 6.2 (A) 07/06/2018   HGBA1C 6.3 (A) 12/25/2017   HGBA1C 6.1 (A) 08/21/2017   HGBA1C 8.1 05/15/2017   HGBA1C >14.0 03/10/2017    Sleep:takes sleep med at 10PM, goes to bed 11 PM,  falls asleep 1230-1 am gets up 6-7 am, takes meds that she has to take fasting, goes back to sleep and finally wakes ~ 9 AM . Keeps food by bedside to eat when she awakens to use bathroom at 2-3 am MEDICATIONS: metformin and Januvia .   DIETARY INTAKE: Usual eating pattern includes 3 meals and 1-2 snacks per day, including fruits, lean proteins, avoids red meat. Eats vegetables almost daily now.  24-hr recall:  9 am- grits or oatmeal, sausage [place], coffee with liquid creamer and  sugar 2 teaspoons 2-3 PM Leftovers or sandwich and pickle, water, Chips,  fruit 6-7 PM-chicken parm or sub sandwich/ sometimes cooks sweet potato in microwave/ green beans/corn/asparagus, greens  snack is Fruit- at bedtime- banana, watermelon Watch TV before bed- lifetime or detective shows  Progress Towards Goal(s):  In progress.   Nutritional Diagnosis:  NB-1.1 Food and nutrition-related knowledge deficit As related to lack of sufficient prior training in diabetes meal planning is improving  As evidenced by her gradual weight decline and decreased A1C and 24 hour recall.    Intervention:  Nutrition education and support about diabetes meal planning, foot care, vaccinations .   Teaching Method Utilized: Visual. Auditory. Hands on Handouts given during visit include: CGM results,  After visit summary Barriers to learning/adherence to lifestyle change: limited material resources Demonstrated degree of understanding via: Questions  Monitoring/Evaluation:  Dietary intake, exercise, meter, and body weight in 3-6 months  Debera Lat, RD 07/13/2018 3:54 PM.

## 2018-07-14 ENCOUNTER — Ambulatory Visit: Payer: Medicare HMO

## 2018-07-14 ENCOUNTER — Other Ambulatory Visit: Payer: Self-pay | Admitting: Internal Medicine

## 2018-07-14 ENCOUNTER — Encounter: Payer: Self-pay | Admitting: Licensed Clinical Social Worker

## 2018-07-14 DIAGNOSIS — I10 Essential (primary) hypertension: Secondary | ICD-10-CM

## 2018-07-14 DIAGNOSIS — K219 Gastro-esophageal reflux disease without esophagitis: Secondary | ICD-10-CM

## 2018-07-20 ENCOUNTER — Ambulatory Visit: Payer: Self-pay | Admitting: *Deleted

## 2018-07-20 ENCOUNTER — Telehealth: Payer: Self-pay | Admitting: Dietician

## 2018-07-20 ENCOUNTER — Other Ambulatory Visit: Payer: Self-pay

## 2018-07-20 DIAGNOSIS — E1151 Type 2 diabetes mellitus with diabetic peripheral angiopathy without gangrene: Secondary | ICD-10-CM

## 2018-07-20 DIAGNOSIS — M5432 Sciatica, left side: Secondary | ICD-10-CM

## 2018-07-20 DIAGNOSIS — M5431 Sciatica, right side: Secondary | ICD-10-CM

## 2018-07-20 MED ORDER — ACCU-CHEK AVIVA PLUS W/DEVICE KIT: PACK | 0 refills | Status: AC

## 2018-07-20 NOTE — Telephone Encounter (Signed)
She lost her glucometer. She requests a prescription for a new one.

## 2018-07-20 NOTE — Telephone Encounter (Signed)
Pt called back and apologized for being rude to nurse, states she has a lot of anxiety and she tries not to be a rude person. Informed her that nurse understands being stressed and made her aware that request had been sent to dr's chundi and cain She also ask for an appt w/ [facility]. And was given one of her choosing 7/14 at1330

## 2018-07-20 NOTE — Telephone Encounter (Signed)
rtc to pt, she stated she wanted to know why the doctor cut her medication, nurse ask which medication, she stated gabapentin, informed her that last script done by dr Maricela Bo was 5 months ago in 02/2018, she stated she is supposed to take more that what was prescribed because the vein specialist told her to, she was informed that the vein specialist would need to provide a script for the incresed amt or would make her an appt to discuss with dr Maricela Bo. She informed nurse that nurse was irritating her and " just do what im telling you and there wont be a problem" informed pt would give her an appt, she stopped talking and after 3 1/2 mins call was disconnected by nurse. Reviewed dr cain's notes do not see where This was changed. Will send to dr Maricela Bo and dr Donzetta Matters. Possibly they can speak and refill script at appropriate dose

## 2018-07-20 NOTE — Telephone Encounter (Signed)
Requesting to speak with a nurse about meds. Please call pt back.  

## 2018-07-21 MED ORDER — GABAPENTIN 400 MG PO CAPS: ORAL_CAPSULE | ORAL | 0 refills | Status: DC

## 2018-07-21 NOTE — Telephone Encounter (Signed)
Thank you :)

## 2018-07-21 NOTE — Telephone Encounter (Signed)
There is no record of increased dosage of gabapentin. Patient would like to be on 2 tablets of gabapentin 400mg  in morning, 1 at noon, 2 in evening which is an increase in gabapentin by 400mg . I told her she can take this increased dosage for the time being to give her relief, but she needs to be seen in [facility] Tuesday 7/14 regarding her foot pain and repeated falls.   I will send in a new script for gabapentin.

## 2018-07-22 ENCOUNTER — Ambulatory Visit: Payer: Medicare HMO | Admitting: Neurology

## 2018-07-22 ENCOUNTER — Encounter: Payer: Self-pay | Admitting: Neurology

## 2018-07-22 ENCOUNTER — Other Ambulatory Visit: Payer: Self-pay

## 2018-07-22 VITALS — BP 151/91 | HR 87 | Temp 97.5°F | Ht 61.0 in | Wt 211.0 lb

## 2018-07-22 DIAGNOSIS — F445 Conversion disorder with seizures or convulsions: Secondary | ICD-10-CM | POA: Diagnosis not present

## 2018-07-22 DIAGNOSIS — R6889 Other general symptoms and signs: Secondary | ICD-10-CM | POA: Diagnosis not present

## 2018-07-22 NOTE — Progress Notes (Addendum)
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 female who presents due to events concerning for seizures. The events began at least 5 years ago. The patient describes the events as the follows: Generalized "jerks," and she has intact awareness. She cannot respond to her environment. The events typically last few minutes. After the events, the patient exhibits the following: tired. These events occur up to 20 minutes.   Hospital Course:  The patient was admitted into the epilepsy monitoring unit and continuous video EEG monitoring was instituted. During her hospitalization, her gabapentin was decreased. With these efforts, the patient had 3 clinical events. These events were characterized by altered mental status and mild twitching of the lower extremities. These events did not correlate with any epileptiform discharges, and the patient's background was also normal. It was felt that these events represented nonepileptic etiologies, likely psychogenic. The patient was receptive to this diagnosis. We discussed the underlying stressors within her life, as well as her mood disorder. I suggested that therapy, in the form of cognitive behavioral therapy, could be beneficial. She is already established with a therapist and will discuss this option with the therapist. She will follow-up with her primary neurologist, Dr. Jaynee Eagles.   [place]   Activity Instructions  Activity as tolerated  Driving Restrictions  - Driving episode instructions: With any loss of awareness episode, the patient is to stop driving and notify the Division of Motor Vehicles as only they can return driving privileges.    HPI 07/25/2018: 68 year old patient here for symptoms ongoing since last  appointment(see initial HPI 09/19/2014 below. [name] is a 68 y.o. female here as a referral from [name] for occasional tremors . She has a past medical history of hypertension, hyperlipidemia, migraine, depression, anxiety. She has many social stressors. She is here with the same symptoms as prior. In 2016 she had imaging of the brain but failed to complete workup including eeg.  Episodic tremors and episodes where she feels numb all over her body, she can see but she can't hear and she can;t move however she has no alteration of awareness and remembers the entire event without postictal symptoms. She did not have the eeg I orderd 3 years ao. She can hear and she can remember everything. Now having 2-3x a week. These episodes last for 30 minutes.  Have been ongoing for at least 5 years and worsening in the setting of stress and triggered by stress. No other focal neurologic deficits, associated symptoms, inciting events or modifiable factors.  HPI 09/19/2014:  [name] is a 68 y.o. female here as a referral from [name] for headache. She has a past medical history of hypertension, hyperlipidemia, migraine, depression, anxiety.  She feels like her head is clogged up. She has a rushing noise in her head. The pain radiates through the eyes and through the vertex of the head. Not in the back. Pressure headache. If she holds her head a certain way the sound starts, it is pulsatile in the ear  . She hears pulses it in the left ear. And has ringing in the left ear as well. Headaches started a few years ago, worse in the last year. She has a remote hstory of migraines which stopped in 1972. This  headache is different. She does not endorse light or sound sensitivity, not migrainous as she remembers them. Aneurysms run in the family. Brother had severe headaches, so did father. They both died from aneurysms. Aunts with aneurysm with kidney disease. Headaches are every day, mostly in the afternoon and  evenings. They start around noon. There are times when she goes completely numb, she can't hear and she trembles like she is going to pass out, she is conscious but she can't move. She can see but she feels confused. It lasts for 30 minutes. She can't understand what is going on around her during these times. No other new focal neurologic complaints.  She is excessively tired during the day. She has nocturnal headaches. She has been told she breathes heavily/snores at night.  She is under a lot of stress.  She has gained weight. Max 9-10/10 in severity for the headaches. Severe.    Reviewed notes, labs and imaging from outside physicians, which showed: MRI was performed in 2012 at the [facility]. Indication was headache. There is no evidence of Chiari malformation, expected flow voids in the major intracranial vessels, an empty sella is present which is a sign nonspecific finding, there are scattered very small foci of increased T2 weighted signal within the subcortical and deep white matter which are nonspecific. 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 There is no evidence of extra-axial collection, mass effect or midline shift. The ventricles and sulci are normal in size. Evidence of previous cataract surgery in the left globe.  Reviewed records going back to 2014 describing frequent headaches. Flexeril worked in the past for her symptoms. In 2015 patient reported headache for 2 months burning sharp bilateral hatband distribution which eased up during sleep. (This was different than the headache she is describing today) stress worsens headache, Flexeril helps the headache. It was thought that hypertension may be playing a role in the headache as well a stress.  Review of Systems: Patient complains of symptoms per HPI as well as the following symptoms: Fatigue, blurred vision, swelling of legs, ringing in ears, spinning sensation, trouble swallowing, aching muscles,  confusion, headaches, slurred speech, difficulty swallowing, dizziness, seizure, tremor, sleepiness, restless legs, anxiety, disinterest in activities, racing thoughts. Pertinent negatives per HPI. All others negative.   Social History   Socioeconomic History   Marital status: Widowed    Spouse name: Not on file   Number of children: 1   Years of education: 12   Highest education level: Not on file  Occupational History   Occupation: Disabled   Scientist, product/process development strain: Somewhat hard   Food insecurity    Worry: Not on file    Inability: Not on file   Transportation needs    Medical: Not on file    Non-medical: Not on file  Tobacco Use   Smoking status: Current Every Day Smoker    Packs/day: 0.40    Years: 0.50    Pack years: 0.20    Types: Cigarettes   Smokeless tobacco: Never Used  Substance and Sexual Activity   Alcohol use: No    Alcohol/week: 0.0 standard drinks   Drug use: Not Currently    Types: Marijuana   Sexual activity: Never    Birth control/protection: None  Lifestyle   Physical activity    Days per week: Not on file    Minutes per session: Not on file   Stress: Very much  Relationships  Social Herbalist on phone: Not on file    Gets together: Not on file    Attends religious service: Not on file    Active member of club or organization: Not on file    Attends meetings of clubs or organizations: Not on file    Relationship status: Not on file   Intimate partner violence    Fear of current or ex partner: Not on file    Emotionally abused: Not on file    Physically abused: Not on file    Forced sexual activity: Not on file  Other Topics Concern   Not on file  Social History Narrative   Live at home alone.   Caffeine use: Drinks coffee once/day   Soda- 2-3 times per day   Right handed    Family History  Problem Relation Age of Onset   Peripheral vascular disease Mother    Heart disease Mother      Hypertension Mother    AAA (abdominal aortic aneurysm) Father    Hypertension Father    Aneurysm Father    Uterine cancer Sister        mets to brain   Liver cancer Brother    Stomach cancer Maternal Aunt        x2   Stomach cancer Paternal Aunt    Heart disease Brother    Kidney cancer Brother    Anuerysm Brother        brain   HIV Brother        x2   Tremor Neg Hx     Past Medical History:  Diagnosis Date   Allergy    Anal fissure    Anxiety    Cataract    Chronic headaches    Depression    Diverticulosis    GERD (gastroesophageal reflux disease)    Glaucoma    HLD (hyperlipidemia)    Hypertension    Hypothyroidism    PAD (peripheral artery disease) (HCC)    PUD (peptic ulcer disease)    TIA (transient ischemic attack)     Past Surgical History:  Procedure Laterality Date   ABDOMINAL AORTAGRAM N/A 01/20/2013   Procedure: ABDOMINAL Maxcine Ham;  Surgeon: Conrad Benton, MD;  Location: [facility] CATH LAB;  Service: Cardiovascular;  Laterality: N/A;   ABDOMINAL AORTOGRAM W/LOWER EXTREMITY N/A 06/03/2017   Procedure: ABDOMINAL AORTOGRAM W/LOWER EXTREMITY;  Surgeon: Waynetta Sandy, MD;  Location: [facility] CV LAB;  Service: Cardiovascular;  Laterality: N/A;  Bilateral   ANAL FISSURE REPAIR     AORTOGRAM     BREAST REDUCTION SURGERY     CATARACT EXTRACTION Left    iliac artery angioplasty and stenting Left 12/2012   PERIPHERAL VASCULAR ATHERECTOMY  06/03/2017   Procedure: PERIPHERAL VASCULAR ATHERECTOMY and Stent;  Surgeon: Waynetta Sandy, MD;  Location: [place] CV LAB;  Service: Cardiovascular;;  Rt. SFA    REDUCTION MAMMAPLASTY Bilateral    SFA stent Left 2012   at [facility]   STOMACH SURGERY     TOTAL THYROIDECTOMY      Current Outpatient Medications  Medication Sig Dispense Refill   acetaminophen (TYLENOL) 500 MG tablet Take 2 tablets (1,000 mg total) by mouth every 8 (eight)  hours as needed for moderate pain. 90 tablet 1   amLODipine (NORVASC) 10 MG tablet Take 1 tablet (10 mg total) by mouth daily. 90 tablet 0   amLODipine (NORVASC) 10 MG tablet TAKE 1 TABLET (10 MG  TOTAL) BY MOUTH DAILY. 90 tablet 0   BESIVANCE 0.6 % SUSP SHAKE LQ AND INT 1 GTT IN OD TID  1   clopidogrel (PLAVIX) 75 MG tablet TAKE 1 TABLET DAILY FOR PERIPHERAL ARTERIAL DISEASE 90 tablet 0   doxepin (SINEQUAN) 150 MG capsule TAKE 1 CAPSULE (150 MG TOTAL) BY MOUTH AT BEDTIME. 60 capsule 1   DUREZOL 0.05 % EMUL PLACE 1 DROP IN RIGHT EYE 3 TIMES DAILY  1   furosemide (LASIX) 20 MG tablet TAKE 1 TABLET EVERY DAY 90 tablet 1   gabapentin (NEURONTIN) 400 MG capsule Please take 2 tablets in morning, one tablet at noon, and two tablets prior to bedtime. 150 capsule 0   JANUVIA 100 MG tablet TAKE 1 TABLET (100 MG TOTAL) BY MOUTH DAILY. 90 tablet 1   levothyroxine (SYNTHROID, LEVOTHROID) 112 MCG tablet Take 1 tablet daily by mouth 1 hour  before breakfast 90 tablet 1   lisinopril-hydrochlorothiazide (ZESTORETIC) 20-25 MG tablet TAKE 1 TABLET EVERY DAY 90 tablet 1   metFORMIN (GLUCOPHAGE-XR) 500 MG 24 hr tablet Take 1 tablet (500 mg total) by mouth 2 (two) times daily. Before lunch and supper 180 tablet 1   OVER THE COUNTER MEDICATION Take 500 mg by mouth daily. Potassium 523m     pantoprazole (PROTONIX) 40 MG tablet TAKE 1 TABLET TWICE DAILY 180 tablet 0   Polyethyl Glycol-Propyl Glycol (SYSTANE OP) Place 1 drop into both eyes daily.     ACCU-CHEK AVIVA PLUS test strip USE 3 TIMES DAILY TO CHECK BLOOD SUGAR. 275 each 1   Accu-Chek Softclix Lancets lancets CHECK BLOOD SUGAR UP TO 4 TIMES DAILY 400 each 1   Alcohol Swabs (B-D SINGLE USE SWABS REGULAR) PADS USE AS NEEDED AS DIRECTED 200 each 1   Blood Glucose Calibration (ACCU-CHEK AVIVA) SOLN      Blood Glucose Monitoring Suppl (ACCU-CHEK AVIVA PLUS) w/Device KIT Use to check blood sugar up to 3 times a day 1 kit 0   Cholecalciferol  (VITAMIN D3) 400 units CAPS Take 400 Units by mouth daily. (Patient not taking: Reported on 01/14/2018) 90 capsule 1   glucose blood (ACCU-CHEK AVIVA PLUS) test strip Use to check blood sugar 3 times daily. 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     Allergies as of 07/22/2018 - Review Complete 07/22/2018  Allergen Reaction Noted   Gadolinium derivatives Anaphylaxis and Other (See Comments) 10/18/2014   Asa [aspirin] Hives and Nausea And Vomiting 12/13/2012   Chantix [varenicline tartrate]  07/22/2018   Crestor [rosuvastatin calcium] Hives and Itching 10/31/2014   Penicillins Hives and Itching 12/13/2012    Vitals: BP (!) 151/91 (BP Location: Right Arm, Patient Position: Sitting)    Pulse 87    Temp (!) 97.5 F (36.4 C) Comment: taken by front staff   Ht _0  (1.549 m)    Wt 211 lb (95.7 kg)    BMI 39.87 kg/m  Last Weight:  Wt Readings from Last 1 Encounters:  07/22/18 211 lb (95.7 kg)   Last Height:   Ht Readings from Last 1 Encounters:  07/22/18 _1  (1.549 m)    Physical exam: Exam: Gen: NAD, conversant, well nourised, obese, well groomed                     CV: RRR, no MRG. No Carotid Bruits. No peripheral edema, warm, nontender Eyes: Conjunctivae clear without exudates or hemorrhage  Neuro: Detailed  Neurologic Exam  Speech:    Speech is normal; fluent and spontaneous with normal comprehension.  Cognition:    The patient is oriented to person, place, and time;     recent and remote memory intact;     language fluent;     normal attention, concentration,     fund of knowledge Cranial Nerves:    The pupils are equal, round, and reactive to light. Attempted could not visualize fundi. OD 20/200, OS 20/100, bilateral 20/70 without correction. Visual fields are full to finger confrontation. Mild proptosis bilaterally. Extraocular movements are intact. Trigeminal sensation is intact and the muscles of  mastication are normal. The face is symmetric. The palate elevates in the midline. Hearing intact. Voice is normal. Shoulder shrug is normal. The tongue has normal motion without fasciculations.   Coordination:    No dysmetria noted  Gait:    Slightly wide base, not ataxic  Motor Observation:    No asymmetry, no atrophy, and no involuntary movements noted. Tone:    Normal muscle tone.    Posture:    Posture is normal. normal erect    Strength:    Strength is V/V in the upper and lower limbs.      Sensation: intact to LT     Reflex Exam:  DTR's:    Deep tendon reflexes in the upper and lower extremities are symmetrical bilaterally.   Toes:    The toes are downgoing bilaterally.   Clonus:    Clonus is absent.  LE prox weakness (she says due to pain)     Assessment/Plan:  [name] is a 68 y.o. female here as a referral from [name] for occasional tremors . She has a past medical history of hypertension, hyperlipidemia, migraine, depression, anxiety. She has many social stressors. She is here with the same symptoms as prior. In 2016 she had imaging of the brain but failed to complete workup including eeg.  Episodic tremors and episodes where she feels numb all over her body, she can see but she can't hear and she can;t move however she has no alteration of awareness and remembers the entire event without postictal symptoms. 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   MRI of the brain and MRA of the head without etiology for her spells. Recommended EEG in the past as well. She is having 3 episodes a week, will ask that she be admitted to the EMU unit at [facility] for inpatient EEG monitoring until an event is captured. I suspect these are non-epileptic events.  Recommended a sleep evaluation in the past due to her obesity, snoring, nocturnal headaches she did not comply.  Per [facility] statutes, patients with seizures are not allowed to drive until they have been  seizure-free for six months.    Use caution when using heavy equipment or power tools. Avoid working on ladders or at heights. Take showers instead of baths. Ensure the water temperature is not too high on the home water heater. Do not go swimming alone. Do not lock yourself in a room alone (i.e. bathroom). When caring for infants or small children, sit down when holding, feeding, or changing them to minimize risk of injury to the child in the event you have a seizure. Maintain good sleep hygiene. Avoid alcohol.    If patient has another seizure, call 911 and bring them back to the ED if: A.  The seizure lasts longer than 5 minutes.      B.  The patient  doesn't wake shortly after the seizure or has new problems such as difficulty seeing, speaking or moving following the seizure C.  The patient was injured during the seizure D.  The patient has a temperature over 102 F (39C) E.  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 female who presents due to events concerning for seizures. The events began at least 5 years ago. The patient describes the events as the follows: Generalized "jerks," and she has intact awareness. She cannot respond to her environment. The events typically last few minutes. After the events, the patient exhibits the following: tired. These events occur up to 20 minutes.   Hospital Course:  The patient was admitted into the epilepsy monitoring unit and continuous video EEG monitoring was instituted. During her hospitalization, her gabapentin was decreased. With these efforts, the patient had 3 clinical events. These events were characterized by altered mental status and mild twitching of the lower extremities. These events did not correlate with  any epileptiform discharges, and the patient's background was also normal. It was felt that these events represented nonepileptic etiologies, likely psychogenic. The patient was receptive to this diagnosis. We discussed the underlying stressors within her life, as well as her mood disorder. I suggested that therapy, in the form of cognitive behavioral therapy, could be beneficial. She is already established with a therapist and will discuss this option with the therapist. She will follow-up with her primary neurologist, Dr. Jaynee Eagles.   [place]   Activity Instructions  Activity as tolerated  Driving Restrictions  - Driving episode instructions: With any loss of awareness episode, the patient  Sarina Ill, MD  [facility] [address] [place], [place] [address]  Phone [phone] Fax [phone]

## 2018-07-22 NOTE — Patient Instructions (Signed)
eeg   Electroencephalogram, Adult An electroencephalogram (EEG) is a test that records electrical activity in the brain. It is often used to diagnose or monitor problems that are related to the brain, such as:  Seizure disorders.  Sleeping problems.  Changes in behavior.  Head injuries.  Fainting spells. Tell a health care provider about:  Any allergies you have.  All medicines you are taking, including vitamins, herbs, eye drops, creams, and over-the-counter medicines.  Any problems you or family members have had with anesthetic medicines.  Any blood disorders you have.  Any surgeries you have had.  Any medical conditions you have or have had, including psychiatric conditions.  Any history of illegal drug use or alcohol abuse. What are the risks? Generally, this is a safe test. If you have a seizure disorder, you may be made to have a seizure during the test. This is done so that your brain activity can be recorded during the seizure. What happens before the procedure?   Arrive with your hair clean and dry. Do not comb your hair toward your scalp to add volume (backcomb), and do not put hair spray, oil, or other hair products in your hair.  Do not have caffeine within 4 hours of having your test.  Follow instructions from your health care provider about: ? Other eating or drinking restrictions. ? Sleeping before the test.  Ask your health care provider about taking your regular and over-the-counter medicines, herbs, and supplements. What happens during the procedure? You will be asked to sit in a chair or lie down. Small metal discs (electrodes) will be attached to your head with an adhesive. These electrodes will pick up on the signals in your brain, and a machine will record the signals. During the test, you may be asked to:  Sit or lie quietly and relax.  Open and close your eyes.  Breathe deeply and rapidly for 3 minutes or longer.  Look at a flashing light  for a short period of time.  Read or look at an image.  Go to sleep. When the test is complete, the electrodes will be removed by using a solution such as acetone. What happens after the procedure? It is up to you to get the results of your procedure. Ask your health care provider, or the department that is doing the procedure, when your results will be ready. Summary  An electroencephalogram (EEG) is a test that is often used to diagnose or monitor problems that are related to the brain.  Do not have caffeine within 4 hours of having your test. Follow other instructions from your health care provider about eating and drinking before the test.  During the procedure, small metal discs (electrodes) will be attached to your head with an adhesive.  During the test, you may be asked to sit or lie quietly and relax. You may also be asked to do other activities during the test. This information is not intended to replace advice given to you by your health care provider. Make sure you discuss any questions you have with your health care provider. Document Released: 12/28/1999 Document Revised: 11/03/2017 Document Reviewed: 11/03/2017 Elsevier Patient Education  2020 [employer].

## 2018-07-27 ENCOUNTER — Other Ambulatory Visit: Payer: Self-pay

## 2018-07-27 ENCOUNTER — Encounter: Payer: Self-pay | Admitting: Licensed Clinical Social Worker

## 2018-07-27 ENCOUNTER — Ambulatory Visit (INDEPENDENT_AMBULATORY_CARE_PROVIDER_SITE_OTHER): Payer: Medicare HMO | Admitting: Internal Medicine

## 2018-07-27 ENCOUNTER — Ambulatory Visit (INDEPENDENT_AMBULATORY_CARE_PROVIDER_SITE_OTHER): Payer: Medicare HMO | Admitting: Licensed Clinical Social Worker

## 2018-07-27 VITALS — BP 142/87 | HR 89 | Temp 98.1°F | Ht 61.0 in

## 2018-07-27 DIAGNOSIS — R0602 Shortness of breath: Secondary | ICD-10-CM | POA: Diagnosis not present

## 2018-07-27 DIAGNOSIS — R208 Other disturbances of skin sensation: Secondary | ICD-10-CM

## 2018-07-27 DIAGNOSIS — R531 Weakness: Secondary | ICD-10-CM | POA: Diagnosis not present

## 2018-07-27 DIAGNOSIS — H9319 Tinnitus, unspecified ear: Secondary | ICD-10-CM | POA: Diagnosis not present

## 2018-07-27 DIAGNOSIS — Z598 Other problems related to housing and economic circumstances: Secondary | ICD-10-CM

## 2018-07-27 DIAGNOSIS — R42 Dizziness and giddiness: Secondary | ICD-10-CM | POA: Diagnosis not present

## 2018-07-27 DIAGNOSIS — R251 Tremor, unspecified: Secondary | ICD-10-CM | POA: Diagnosis not present

## 2018-07-27 DIAGNOSIS — F331 Major depressive disorder, recurrent, moderate: Secondary | ICD-10-CM

## 2018-07-27 DIAGNOSIS — H9313 Tinnitus, bilateral: Secondary | ICD-10-CM

## 2018-07-27 DIAGNOSIS — R519 Headache, unspecified: Secondary | ICD-10-CM

## 2018-07-27 DIAGNOSIS — R51 Headache: Secondary | ICD-10-CM | POA: Diagnosis not present

## 2018-07-27 DIAGNOSIS — R6889 Other general symptoms and signs: Secondary | ICD-10-CM | POA: Diagnosis not present

## 2018-07-27 DIAGNOSIS — E039 Hypothyroidism, unspecified: Secondary | ICD-10-CM

## 2018-07-27 DIAGNOSIS — F329 Major depressive disorder, single episode, unspecified: Secondary | ICD-10-CM

## 2018-07-27 DIAGNOSIS — W57XXXA Bitten or stung by nonvenomous insect and other nonvenomous arthropods, initial encounter: Secondary | ICD-10-CM

## 2018-07-27 DIAGNOSIS — F419 Anxiety disorder, unspecified: Secondary | ICD-10-CM

## 2018-07-27 DIAGNOSIS — B888 Other specified infestations: Secondary | ICD-10-CM

## 2018-07-27 MED ORDER — HYDROCORTISONE ACETATE 0.5 % EX CREA: TOPICAL_CREAM | Freq: Two times a day (BID) | CUTANEOUS | 0 refills | Status: AC

## 2018-07-27 NOTE — Progress Notes (Signed)
   CC: tremors  HPI:  [name] is a 68 y.o. with PMH as below.She continues to have symptoms where she suddenly becomes numb, can't hear or move but can see and ends up on the floor and isn't sure how she got there although she can feel herself falling. She usually just lays there until it passes. Episodes don't last more than 20-30 minutes. Everything seems to be muffled when this happens. Used to happen 1-2 times per day but now it's been about 2-3 times per day. She also has wringing in her ears bilaterally, left more than right along with pulsations in her left ear.  She also has been having pain in the left side of her face and near her eye that she describes as burning, different from migraines she used to have. No changes in vision, ear pain, or jaw pain. She has bed bugs, water leakage through the ceiling and walls and black mold currently where she is staying. She has multiple small pruritic papules clustered in a row on her arms. Pictures she showed me on her phone do look like bed bugs and show mold and water stains throughout the place where she stays. She states her landlord has not done anything about the mold although she has complained.    Please see A&P for assessment of the patient's acute and chronic medical conditions.   Past Medical History:  Diagnosis Date  . Allergy   . Anal fissure   . Anxiety   . Cataract   . Chronic headaches   . Depression   . Diverticulosis   . GERD (gastroesophageal reflux disease)   . Glaucoma   . HLD (hyperlipidemia)   . Hypertension   . Hypothyroidism   . PAD (peripheral artery disease) ([place])   . PUD (peptic ulcer disease)   . TIA (transient ischemic attack)    Review of Systems:   Review of Systems  Constitutional: Negative for chills, fever and weight loss.  HENT: Positive for tinnitus. Negative for congestion, ear discharge, ear pain, hearing loss, nosebleeds, sinus pain and sore throat.   Eyes: Positive for pain.  Negative for blurred vision, discharge and redness.  Respiratory: Positive for shortness of breath. Negative for cough, wheezing and stridor.   Cardiovascular: Negative for chest pain, palpitations and leg swelling.  Gastrointestinal: Negative for abdominal pain, constipation, diarrhea and nausea.  Skin: Positive for itching and rash.  Neurological: Positive for dizziness, tremors, sensory change, weakness and headaches (pain on left side of head and front ). Negative for loss of consciousness.  Psychiatric/Behavioral: Positive for depression. Negative for suicidal ideas. The patient is not nervous/anxious.      Physical Exam:  Constitution: mild distress, well-dressed female  HENT: very TTP left temporal region, NTTP on right; AT, [place] Eyes: eom intact, fat protrusion superiolateral left eye, mildly TTP, soft Cardio: RRR, no m/r/g, no jvp, no carotid bruits, no LE edema  Respiratory: CTA, no wheezing rales rhonchi  MSK: UE and LE strength 5/5  Neuro: anxious, a&ox3, cn II-XII intact  Skin: small erythematous macules on arms and trunk    Vitals:   07/27/18 1417  BP: (!) 142/87  Pulse: 89  Temp: 98.1 F (36.7 C)  TempSrc: Oral  SpO2: 100%  Height: 5\' 1"  (1.549 m)    Assessment & Plan:   See Encounters Tab for problem based charting.  Patient discussed with Dr. Angelia Mould

## 2018-07-27 NOTE — Patient Instructions (Signed)
Thank you for allowing [place] to provide your care today. Today we discussed your bed bugs, the mold in your apartment, and your headaches and tremors   I have ordered the following labs for you:  ESR, TSH, CRP, and CBC    I will call if any are abnormal.    I have also placed a referral   Today we made the following changes to your medications:   Please START taking  Hydrocortisone Cream - please use 2 times per day in areas of itching as needed.   Please follow-up in one month to reassess symtpoms and after meeting with ENT and having your EEG done.    Should you have any questions or concerns please call the internal medicine clinic at [phone].

## 2018-07-27 NOTE — BH Specialist Note (Signed)
Integrated Behavioral Health Follow Up Visit  MRN: [id number] Name: [name]  Number of [place] Clinician visits: 2/6 Session Start time: 1:42  Session End time: 2:12 Total time: 30 minutes  Type of Service: Integrated Behavioral Health- Individual Interpretor:No.   SUBJECTIVE: [name] is a 68 y.o. female  whom attended the session individually.  Patient reports the following symptoms/concerns: recurrent episodes of depression and anxiety, health concerns, environmental stressors, interpersonal relationship issues, loneliness, paranoia, and panic attacks. Duration of problem:Patient reported recurrent since childhood, and progressed most recently since March ; Severity of problem: moderate  OBJECTIVE: Mood: Depressed and Affect: Tearful Risk of harm to self or others: No plan to harm self or others  LIFE CONTEXT: Family and Social: Patient reported she feels very alone. Patient has friends and family support, but reported she cannot live with any of them due to fears that she could share infestation with her family.  Self-Care: Continuing to isolate more than she should. Patient's living conditions are unsafe (mold and bedbugs).  Life Changes: Pt reported she is overly stressed due to her unsafe living environment, and is not sleeping.   GOALS ADDRESSED: Patient will: 1.  Reduce symptoms of: agitation, anxiety, depression, insomnia, mood instability and stress  2.  Increase knowledge and/or ability of: coping skills, healthy habits, self-management skills and stress reduction  3.  Demonstrate ability to: Increase healthy adjustment to current life circumstances, Increase adequate support systems for patient/family and change living environments.  INTERVENTIONS: Interventions utilized:  Motivational Interviewing, Brief CBT, Supportive Counseling and Link to [employer] Standardized Assessments completed: assessed for SI, HI, and  self-harm.  ASSESSMENT: Patient currently experiencing increased stress levels. Patient reported she is living in a unsafe environment, and is in need of emergency housing. Patient is meeting with her doctor today in order to receive a letter suggesting emergency housing. Patient was tearful the entire session, and feels embarrassed regarding her current living environment. Patient is continuing to have seizures, anxiety, depression, sleep disturbances, and paranoia. Patient reported at night time she is unsure if she is actually being taken over by bedbugs or if she is just having fears.    Patient may benefit from weekly counseling and a Education officer, museum.  PLAN: 1. Follow up with behavioral health clinician on : one week.   Dessie Coma, [facility], [place]

## 2018-07-28 ENCOUNTER — Other Ambulatory Visit: Payer: Self-pay | Admitting: *Deleted

## 2018-07-28 ENCOUNTER — Other Ambulatory Visit: Payer: Self-pay | Admitting: Internal Medicine

## 2018-07-28 ENCOUNTER — Encounter: Payer: Self-pay | Admitting: *Deleted

## 2018-07-28 DIAGNOSIS — F332 Major depressive disorder, recurrent severe without psychotic features: Secondary | ICD-10-CM

## 2018-07-28 LAB — CBC
Hematocrit: 32.3 % — ABNORMAL LOW (ref 34.0–46.6)
Hemoglobin: 10.1 g/dL — ABNORMAL LOW (ref 11.1–15.9)
MCH: 25.1 pg — ABNORMAL LOW (ref 26.6–33.0)
MCHC: 31.3 g/dL — ABNORMAL LOW (ref 31.5–35.7)
MCV: 80 fL (ref 79–97)
Platelets: 307 10*3/uL (ref 150–450)
RBC: 4.02 x10E6/uL (ref 3.77–5.28)
RDW: 16.3 % — ABNORMAL HIGH (ref 11.7–15.4)
WBC: 6.8 10*3/uL (ref 3.4–10.8)

## 2018-07-28 LAB — SEDIMENTATION RATE: Sed Rate: 70 mm/hr — ABNORMAL HIGH (ref 0–40)

## 2018-07-28 LAB — TSH: TSH: 2.85 u[IU]/mL (ref 0.450–4.500)

## 2018-07-28 LAB — C-REACTIVE PROTEIN: CRP: 4 mg/L (ref 0–10)

## 2018-07-28 NOTE — Patient Outreach (Signed)
[place] [facility]) Care Management  07/28/2018  [name] 1950-03-16 [id number]   [place] Referral RequestOutreach  Referral Date:06/09/2017 Referral Source:Transfer from [place] Reason for Referral:Continued Disease Management Education Insurance:[employer] Medicare   Outreach Attempt:  Received physician referral for [facility] Social Work assistance.  RN Health Coach had placed [facility] Social Work referral last month.  [facility] Social Workers-Janet and Museum/gallery conservator has outreached to patient without success.  RN Health Coach spoke with [name] and requested she outreach again and I would speak with patient requesting she answer Social Workers call. Successful telephone outreach to patient to follow up on Physician referral received for [place] Work assistance with depression and housing resources. HIPAA confirmed with patient.  Patient continues to report feeling depressed and isolated.  States the physicians are thinking she is having seizures and they may be caused by her housing conditions of "black mold" and bed bugs.  Endorses now working with [employer] for depression and states she has seen them twice and looks forward to working with them.  Has seen the Neurologist and is reporting they are wanting her to be admitted to hospital for 3 days to evaluate seizures (per Neurologist note possible admission to EMU unit at [facility] to evaluate seizures).  Patient does report she has seen home health therapist once on evaluation from [facility] but was told they can no longer come out due to issues with bedbugs.  RN Health Coach attempted to outreach [place] Case Manager with [facility] [place] [phone]) to confirm visuals of bedbugs and inability to work with patient, but no answer; message left and awaiting return phone call back.  Discussed with patient family resources for housing.  Patient stating her daughter, nieces, adopted daughters and  grandchildren stay in the [place] area and at this time she does not want to leave [facility] at this time due to medical conditions and being evaluated by the physicians.  Does report that long term plan is to move closer to her family, living on her own.  Discussed with patient [facility] Social Work referral placed last month and that they have been trying to reach her.  Patient stating she usually does not answer unknown numbers for fear of "what she will be told".  RN Health Coach gave names and numbers of [facility] Social Workers (Janet-[phone] and [employer] [phone]) and requested patient answer calls from them or to give the social workers a call. Patient stated she would answer their calls or give them a call soon. Reports blood sugars have been fairly controlled, while home blood pressure readings have been variable (a little high when more stressed).  Last Hgb A1C 6.2 on 07/06/2018.  Congratulated patient on maintaining A1C within goal range in the mist of anxiety and depression.    Plan: RN Health Coach updated [facility] Social Nurse, learning disability and requested she contact patient.  RN Health Coach updated [facility] Social Worker [name] and requested she contact patient. RN Health Coach will make next telephone outreach to patient within the month of July.   [place] [phone] [email or web address]

## 2018-07-28 NOTE — Patient Outreach (Signed)
[place] [facility]) Care Management  07/28/2018  [name] [birth year] [id number]   CSW spoke with pt by phone today and confirmed her identity. CSW inquired with pt about her depression and mental health concerns. "There's not anything anyone can do". Pt did share that she is on RX for depression and sleep and that she sees "Dr Ishmael Holter" for counseling/therapy.  Pt is very focused on her housing issues and wanting help to find alternative options. CSW reminded her that Luetta Nutting has attempted outreach and should be calling her again- stressed the need for her to answer the phone and/or return message.   CSW discussed her mental health needs briefly and CSW has offered to call her back tomorrow to discuss in more depth if she is up for it.   Eduard Clos, MSW, [place] Worker  [place] [phone]

## 2018-07-28 NOTE — Progress Notes (Signed)
This encounter was created in error - please disregard.

## 2018-07-29 ENCOUNTER — Other Ambulatory Visit: Payer: Self-pay | Admitting: Internal Medicine

## 2018-07-29 ENCOUNTER — Other Ambulatory Visit: Payer: Self-pay | Admitting: *Deleted

## 2018-07-29 ENCOUNTER — Other Ambulatory Visit: Payer: Self-pay

## 2018-07-29 DIAGNOSIS — R519 Headache, unspecified: Secondary | ICD-10-CM | POA: Insufficient documentation

## 2018-07-29 DIAGNOSIS — B888 Other specified infestations: Secondary | ICD-10-CM

## 2018-07-29 HISTORY — DX: Other specified infestations: B88.8

## 2018-07-29 MED ORDER — PREDNISONE 20 MG PO TABS: 60.0000 mg | ORAL_TABLET | Freq: Every day | ORAL | 0 refills | Status: DC

## 2018-07-29 MED ORDER — FERROUS SULFATE 325 (65 FE) MG PO TBEC: 325.0000 mg | DELAYED_RELEASE_TABLET | ORAL | 0 refills | Status: DC

## 2018-07-29 NOTE — Patient Outreach (Signed)
[place] [facility]) Care Management  07/29/2018  [name] 07-31-1950 [id number]   Successful outreach to patient today regarding social work referral for housing resources.  Per patient, her current residence has black mold as well as bed bug infestation.  Patient states that she needs to relocate as soon as possible due to her current health conditions. BSW informed her that the only options for "emergency housing" would be a shelter or hotel.  Patient reports having family in the [place] area. BSW inquired about the option to live with a family member even if temporarily.  Patient stated that this is not an option but they are researching housing options for her in that area.  BSW will mail the following housing resources to patient: [email or web address] listing Affordable Housing Management listing Senior Housing listing  BSW will follow up within the next two weeks to ensure receipt of resources.  Ronn Melena, BSW Social Worker [phone]

## 2018-07-29 NOTE — Patient Outreach (Signed)
[place] [facility]) Care Management  07/29/2018  [name] 05/02/1950 [id number]   CSW made contact with pt and confirmed her identity. CSW inquired with pt about her mental health needs; she admits to depression, anxiety and grief/loss. "I have lost a lot of loved ones" to include her husband in 2016 and sister in 2014.  She has been taking medication and also seeing a counselor, "Dr. Ishmael Holter".  She is willing to consider RX adjustment/changes and would like for CSW to communicate this to PCP. "I don't feel comfortable bringing it up but if they do I am ok".  Pt denies SI/HI. She has seen Dr Casimiro Needle (Psychiatry) and has been on Zoloft and Abilify and currently is taking Serequel. CSW will update PCP and request follow up with pt regarding this.  CSW will also ask Amber Chrismon, BSW, to outreach pt regarding housing needs- pt admits to her anxiety/depression have been worse because of her housing issues (mold and bedbugs).  CSW encouraged pt to call if needs arise before follow up call- CSW will mail pt info on [facility]ospice [facility] programs and follow up in 7-10 days.    Eduard Clos, MSW, [place] Worker  [place] [phone]

## 2018-07-29 NOTE — Progress Notes (Signed)
Thank you for letting me know. We will continue to work with her.

## 2018-07-29 NOTE — Progress Notes (Signed)
Patient with iron deficiency anemia. Prescribed ferrous sulfate 65mg  elemental iron every other day for 3 months. Called patient but was unable to reach.

## 2018-07-29 NOTE — Assessment & Plan Note (Addendum)
On physical exam she is very tender to palpation in the temporal region on the left. No tenderness on right. No carotid bruits present. No ataxia. CN exam normal. She does have what appears to be a fat protrusion above her left eye believe the lid in the superiolateral region, which she attributes to previous cataract surgery.  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 Her description of symptoms are not quite consistent with typical seizure, and she does have a significant psychiatric history. Differential also includes focal seizure, vasculitis, and I am also concerned about possible giant cell arteritis with her severe temporal tenderness and eye pain.   - urgent referral to ENT - f/u with inpatient EEG  - ESR, CRP, TSH   ADDENDUM: ESR elevated to 70. Will start prednisone 60 mg qd and send referral for temporal artery biopsy. Discussed plan with her, and we will have her follow-up in two weeks to see how she is doing.

## 2018-07-29 NOTE — Assessment & Plan Note (Signed)
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 Her description of symptoms are not quite consistent with typical seizure, and she does have a significant psychiatric history. Differential also includes focal seizure, vasculitis, and I am also concerned about possible giant cell arteritis with her severe temporal tenderness and eye pain.   - see temporal pain under problem list for further details - f/u with inpatient EEG

## 2018-08-02 ENCOUNTER — Other Ambulatory Visit: Payer: Self-pay | Admitting: *Deleted

## 2018-08-02 ENCOUNTER — Encounter: Payer: Self-pay | Admitting: *Deleted

## 2018-08-02 NOTE — Patient Outreach (Signed)
[place] [facility]) Care Management  08/02/2018  [name] 05-30-1950 [id number]   [place]  Referral Date:06/09/2017 Referral Source:Transfer from [place] Reason for Referral:Continued Disease Management Education Insurance:[employer] Medicare   Outreach Attempt:  Successful telephone outreach to patient for follow up.  HIPAA verified with patient.  Patient continues to be sad, depressed,emotional and somewhat hard to redirect.  Reports she now has "swelling on her brain related to black mold and needs to take a steroid and has to have a biopsy on her brain".  Reviewed providers notes and discussed with patient temporal arteritis and how steroids will help with inflammation of her temporal artery.  Patient seemed to calm down a little from some of the explanation of the disease process and where biopsy if needed would happen.  Does report she has not started her prednisone treatment because "it has not come yet".  RN Health Coach contacted [place] on [address] and was told prescription was ready and co pay was 24 cents.  Updated patient and she states she would take the bus to go pick up prescription and could afford the co pay.  Patient continues to concentrate on concern for mold in her home and how it is affecting her with possible seizures and now some shortness of breath.  States she can not focus on much and is having trouble finding another place to live.  Reports she has went to [employer] and is in the process of reviewing other housing options but has not found anything affordable.  In the middle of conversation, patient stating she has no food in the home to eat.  Asked patient the last time she ate and she states yesterday she ate can of tuna and baked potato, has not eaten anything today (but does state she has a little food to eat for today).  Discussed food banks and resources and patient stating she  does not have a ride and can not carry the food back with her on public transportation.  RN Health Coach outreach to [facility] Social Nurse, learning disability to help with food resources; Amber to follow up with patient.  Also discussed with patient the difficulties with housing options and possibility of staying with her daughters out of town.  Patient stated earlier in conversation, daughter in [place] is wanting her Neurology hospital evaluation to be done closer to her at [facility] or [place].  Patient stating she does not want to go stay with daughters out of town due to not wanting to have to share a room with one of her granddaughters or displacing one of her grandsons out of his room for her to stay there.  Discussed with patient her limited options and how not accepting help from her daughters could be causing more stress to the situation and more stress for her daughters.  Patient stating she would think about it and have conversation with daughters by the end of the month.  Also encouraged patient to discuss possible transfer of medical services to providers closer to her daughters with her primary care providers to be closer to her family for support.  Appointments:  Patient last attended appointment with primary care provider on 07/27/2018 and Neurologist on 07/22/2018.  She is awaiting for follow ups to be scheduled.  Plan: RN Health Coach outreached to [place] Worker for food resources. RN Health Coach will route note to primary care provider for update. RN Health Coach will make another outreach to patient within  the month of July.  [place] [phone] [email or web address]

## 2018-08-02 NOTE — Patient Outreach (Signed)
[place] [facility]) Care Management  08/02/2018  [name] 1950/07/31 [id number]   CSW spoke with pt by phone after being advised by colleague, Luetta Nutting Chrismon, BSW, of pt's concerns related to not having food.  Pt states, " I have no meat and I have only some crackers and cans of vegetables".  CSW attempted to problem solve with pt about options for food pantry, free meal locations, etc.  Pt gets food stamps ($16) and is of money at this time.  CSW discussed with Amber, BSW, and both agree pt could benefit from use of the [place] for some grocery assistance.  CSW advised pt of this; she was very appreciative and states "I will budget better for next month".   CSW will plan follow up as previously scheduled.  [name], MSW, [place] Worker  [place] [phone]

## 2018-08-03 ENCOUNTER — Other Ambulatory Visit: Payer: Self-pay

## 2018-08-03 ENCOUNTER — Telehealth: Payer: Self-pay | Admitting: Internal Medicine

## 2018-08-03 ENCOUNTER — Other Ambulatory Visit: Payer: Self-pay | Admitting: *Deleted

## 2018-08-03 ENCOUNTER — Telehealth: Payer: Self-pay | Admitting: Neurology

## 2018-08-03 ENCOUNTER — Other Ambulatory Visit: Payer: Self-pay | Admitting: Internal Medicine

## 2018-08-03 DIAGNOSIS — M5432 Sciatica, left side: Secondary | ICD-10-CM

## 2018-08-03 DIAGNOSIS — E1151 Type 2 diabetes mellitus with diabetic peripheral angiopathy without gangrene: Secondary | ICD-10-CM

## 2018-08-03 DIAGNOSIS — M5431 Sciatica, right side: Secondary | ICD-10-CM

## 2018-08-03 NOTE — Patient Outreach (Signed)
[place] [facility]) Care Management  08/03/2018  AMYE GREGO 1950/10/30 [id number]   BSW utilized [place] funds to purchase groceries for patient as she reported yesterday having minimal food and minimal funds to purchase more food.   BSW delivered groceries to patient's home today; did not enter home due to [place] restrictions.  Patient was appreciative.     Ronn Melena, BSW Social Worker [phone]

## 2018-08-03 NOTE — Telephone Encounter (Signed)
Pt would like a call back to discuss her medications.

## 2018-08-03 NOTE — Telephone Encounter (Signed)
Pt is asking for a call from RN to discuss if her upcoming 7 day Epileptic evaluation test can be done at either [facility] or [place] since that is the area that her children are in. Please call

## 2018-08-03 NOTE — Progress Notes (Signed)
Internal Medicine Clinic Attending  Case discussed with Dr. Seawell at the time of the visit.  We reviewed the resident's history and exam and pertinent patient test results.  I agree with the assessment, diagnosis, and plan of care documented in the resident's note.    

## 2018-08-03 NOTE — Telephone Encounter (Signed)
I do not maybe we can ask [name] to see if we can refer to Epilepsy monitoring unit elsewhere. Is she having transportation issues, is that why she would like to go elsewhere? If it is just preference then lets stay at [facility], if she has other issues then we can look.

## 2018-08-03 NOTE — Telephone Encounter (Signed)
Called pt b/c rx for Gabapentin was filled on 7/8 sent to [facility]; this request is from [place]. Pt stated she uses [employer]; but will pick up rx from [facility] and she wants any refills to go to [place].

## 2018-08-04 MED ORDER — SITAGLIPTIN PHOSPHATE 100 MG PO TABS: ORAL_TABLET | ORAL | 0 refills | Status: DC

## 2018-08-04 NOTE — Telephone Encounter (Signed)
I spoke with the patient. Her daughter lives in [place] and would likely be taking her. She would like to see if there is an EMU in that area however she is prepared to stay with [facility] if she is unable to be referred to one at [facility] or [facility]. She isn't sure if she can have visitors at the EMU. She verbalized appreciation for the call. She will be here for EEG on 8/6 @ 2:00 pm.

## 2018-08-05 ENCOUNTER — Ambulatory Visit: Payer: Medicare HMO | Admitting: Podiatry

## 2018-08-09 ENCOUNTER — Other Ambulatory Visit: Payer: Self-pay | Admitting: *Deleted

## 2018-08-09 ENCOUNTER — Other Ambulatory Visit: Payer: Self-pay | Admitting: Internal Medicine

## 2018-08-09 DIAGNOSIS — R519 Headache, unspecified: Secondary | ICD-10-CM

## 2018-08-09 NOTE — Patient Outreach (Signed)
[facility]) Care Management  08/09/2018  JOESPHINE SCHEMM [birth year] [id number]  CSW spoke with pt who reports her landlord has arranged for an exterminator to come. She also shares that she plans to move to the [place], [place] area where her children are close by.  "I'm planning to move in October but may move sooner".  She is appreciative of [employer], [employer], BSW, assisting her with a one time emergency groceries.  She commits to "budgeting better now".   Pt is a bit anxious about her planned "brain stuff" where she will have an EEG and a 7 day/night screening. She admits to being "exhausted" and thinks her iron is low- has questions for Pharmacy and I will ask [facility] to reach out to her.   She appears to have a lot of anxiety and states her overall mental health is ok; "hoping to have a telephonic visit" with her mental health Provider this week.    Pt feels better/comforted by her "talking to someone". Pt agreeable to a follow up call from [place] next week.   Eduard Clos, MSW, [place] Worker  [place] [phone]

## 2018-08-10 ENCOUNTER — Ambulatory Visit (INDEPENDENT_AMBULATORY_CARE_PROVIDER_SITE_OTHER): Payer: Medicare HMO | Admitting: Licensed Clinical Social Worker

## 2018-08-10 ENCOUNTER — Telehealth: Payer: Self-pay | Admitting: Internal Medicine

## 2018-08-10 ENCOUNTER — Encounter: Payer: Self-pay | Admitting: Licensed Clinical Social Worker

## 2018-08-10 ENCOUNTER — Telehealth: Payer: Self-pay

## 2018-08-10 ENCOUNTER — Other Ambulatory Visit: Payer: Self-pay

## 2018-08-10 ENCOUNTER — Telehealth: Payer: Self-pay | Admitting: Dietician

## 2018-08-10 DIAGNOSIS — F331 Major depressive disorder, recurrent, moderate: Secondary | ICD-10-CM

## 2018-08-10 NOTE — BH Specialist Note (Signed)
Integrated Behavioral Health Follow Up Visit  MRN: [id number] Name: [name]  Number of [place] Clinician visits: 3/6 Session Start time: 1:38  Session End time: 1:58 Total time: 20 minutes  Type of Service: Integrated Behavioral Health- Individual Interpretor:No.  SUBJECTIVE: [name] is a 68 y.o. female  whom attended the session individually via phone.  Patient was referred by  for [name] for depression. Patient reports the following symptoms/concerns: health concerns, environmental stressors, interpersonal relationship issues, loneliness, paranoia, depression, and panic attacks.  Duration of problem: recurrent since March; Severity of problem: moderate  OBJECTIVE: Mood: Anxious and Depressed and Affect: Depressed Risk of harm to self or others: No plan to harm self or others  GOALS ADDRESSED: Patient will: 1.  Reduce symptoms of: anxiety, depression, mood instability and stress  2.  Increase knowledge and/or ability of: coping skills, healthy habits, self-management skills and stress reduction  3.  Demonstrate ability to: Increase healthy adjustment to current life circumstances, Increase adequate support systems for patient/family and Increase motivation to adhere to plan of care  INTERVENTIONS: Interventions utilized:  Motivational Interviewing, Brief CBT and Supportive Counseling Standardized Assessments completed: assessed for SI, HI, and self-harm.  ASSESSMENT: Patient currently experiencing increased anxiety and depression due to ongoing housing issues. Patient reported that the owner of the motel she is currently living in, had individuals to come into her room to exterminate and spray for mold. Patient reported she was not giving any notice, and this placed her under a tremendous amount of stress. Patient has not been able to find other housing. Patient reported that she has to make it through this month in [place], and then she  will be moving closer to her children.   Patient processed her increasing health issues and concerns. Patient's medications were recently changed, and the patient is overwhelmed. Patient will be connected to a triage nurse to discuss medication changes.   Patient reviewed her coping skills, and plans to rest over the next day or so due to her physical and mental exhaustion.   Patient may benefit from weekly outpatient therapy.  PLAN: 1. Follow up with behavioral health clinician on : one week.   Dessie Coma, [facility], [place]

## 2018-08-10 NOTE — Telephone Encounter (Signed)
Received the following message from [facility] CM. Placing in chart so all providers can see.   "Just spoke with patient. She was very upset stating she now has "swelling on her brain due to black mold and needs a steroid to help and a brain biopsy". 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 She had not started her prednisone. Informed patient medication was ready at Pharmacy for pick up. Patient stating her daughter is wanting her Inhospital EEG to be performed closer to her at [facility] or [facility]. Discussed patient's trouble with finding new housing versus going to stay with her family out of town. [name] really lacks resources and support here in [place]. Mentioned she does not have food to eat and unable to go to food banks because she can not transport back on Public Transportation (have contacted [facility] Social Worker to assist with this). Is reluctantly considering going to stay with daughters. Maybe her medical team could discuss with her accepting the help from her daughters and possible seeking medical providers closer the where her daughters are staying. Please call with questions or concerns.   Thanks,   Hubert Azure RN  [place]  [phone]  [email or web address]"

## 2018-08-10 NOTE — Telephone Encounter (Signed)
I called patient regarding rescheduling her 8/6 EEG due to tech being out. Patient states that she will need to check her schedule and call us back to reschedule.

## 2018-08-10 NOTE — Telephone Encounter (Signed)
Called and spoke to [place] at [employer] [phone] - fax [phone] . For EMU monitoring Marcie Bal will call Daughter to schedule.  I have left daughter and patient a message with details.

## 2018-08-10 NOTE — Telephone Encounter (Signed)
Patient was prescribed prednisone 60 mg x 12 days on 07/29/18 for temporal arteritis. She did not start them as of 08/02/18 per [facility] note, but planned to pick them up an start taking them.   Called her today and left a message for call back to assess blood sugars while taking steroids.  Debera Lat, RD 08/10/2018 5:45 PM.

## 2018-08-12 ENCOUNTER — Other Ambulatory Visit: Payer: Self-pay

## 2018-08-12 ENCOUNTER — Other Ambulatory Visit: Payer: Self-pay | Admitting: *Deleted

## 2018-08-12 ENCOUNTER — Encounter: Payer: Self-pay | Admitting: *Deleted

## 2018-08-12 ENCOUNTER — Ambulatory Visit: Payer: Self-pay

## 2018-08-12 NOTE — Patient Outreach (Signed)
[facility]) Care Management  08/12/2018  [name] 1950-05-21 [id number]   RN Health CoachFollow up/Outreach  Referral Date:06/09/2017 Referral Source:Transfer from [place] Reason for Referral:Continued Disease Management Education Insurance:[employer]   Outreach Attempt:  Successful telephone outreach to patient.  HIPAA verified with patient.  States her landlord is trying to clear some things.  Reports he has come clean the mold and had apartment sprayed for bed bugs.  But now her air is not working properly, has fan to help stay cool.  States she feels some better and mood sounds a little better.  Still concentrating on all her medical issues.  States she is just now receiving her full prescription of prednisone to start today.  Has her temporal biopsy scheduled for 8/21and upcoming therapy appointment on 8/4.  Fasting blood sugar 120 this morning.  Stating the 2 days of prednisone she had previously taken [facility] increased her blood sugars.  She has spoken with primary care office and they have given direction on when to notify them of sustained elevations.  Patient reporting that her 7 day EEG scan is to be done at [facility] closer to her daughter and she plans to move closer to her daughters in September or October.  States she has not been able to find temporary housing here in town and will just wait to move closer to her family.  Expresses appreciation of [employer] providing her with some food.  Does state she is running low on bottled water, but feels she has enough to get her to Monday when she gets paid.  Appointments:  States she has appointment with Dessie Coma for therapy on 08/17/2018.  Scheduled for temporal biopsy on 09/03/2018 but is awaiting information concerning biopsy.  Plan: RN Health Coach will make another outreach to patient with in the month of August.  Rembert Browe RN [place] [phone] [email or web address]

## 2018-08-12 NOTE — Telephone Encounter (Addendum)
Prevention of steroid induced hyperglycemia follow up:   Steroids: took the 7 tablets of prednisone 2 days  last week but the was a mix up in delivery and she has not any more to take. She thinks she straightened that out yesterday so they should arrive tomorrow.  Steroid indication: temporal  arteritis A1C prior to steroids: 6.2% Blood sugar: checks a blood sugar 1-3 times a daysaw a rise in blood sugars with the steroids to 172/165 last week, but it was back down to her usual of 120 this am.  Physician protocol preference: 1 Diabetes Medicine: metformin XR 500 mg two times a day, januvia 100 mg once daily Hypoglycemia symptoms: no Hyperglycemia symptoms: no A/P: advised [name] on protocol and when and how often to check her blood sugar.  She verbalized understanding.  No need for additional medication at this time Follow up: daily   Debera Lat, RD 08/12/2018 9:30 AM.

## 2018-08-12 NOTE — Patient Outreach (Addendum)
[facility]) Care Management  08/12/2018  [name] 1950/07/27 [id number]   Follow up call to patient today regarding housing resources and food insecurity.   Patient stated that she plans to move to [facility] or [place] area in October to be closer to family.  She would like to find temporary housing until then but understands this will be a challenge.  Patient received housing resources that were mailed on 07/29/18.  She has called several places but, as expected, they have no vacancies and have wait lists.  Patient stated that another hotel could be an option but she does not want to end up in "a worse place" than she already is and her "landlord is trying".   Patient reports having enough food to last until she gets her check on Monday.  BSW closing case at this time.  CSW, Eduard Clos, remains involved.    Ronn Melena, BSW Social Worker [phone]

## 2018-08-13 ENCOUNTER — Other Ambulatory Visit: Payer: Self-pay

## 2018-08-13 ENCOUNTER — Other Ambulatory Visit: Payer: Self-pay | Admitting: Podiatry

## 2018-08-13 ENCOUNTER — Ambulatory Visit (INDEPENDENT_AMBULATORY_CARE_PROVIDER_SITE_OTHER): Payer: Medicare HMO

## 2018-08-13 ENCOUNTER — Ambulatory Visit (INDEPENDENT_AMBULATORY_CARE_PROVIDER_SITE_OTHER): Payer: Medicare HMO | Admitting: Podiatry

## 2018-08-13 VITALS — Temp 98.2°F

## 2018-08-13 DIAGNOSIS — G5761 Lesion of plantar nerve, right lower limb: Secondary | ICD-10-CM

## 2018-08-13 DIAGNOSIS — G5762 Lesion of plantar nerve, left lower limb: Secondary | ICD-10-CM

## 2018-08-13 DIAGNOSIS — R6889 Other general symptoms and signs: Secondary | ICD-10-CM | POA: Diagnosis not present

## 2018-08-13 DIAGNOSIS — E1151 Type 2 diabetes mellitus with diabetic peripheral angiopathy without gangrene: Secondary | ICD-10-CM

## 2018-08-13 DIAGNOSIS — B351 Tinea unguium: Secondary | ICD-10-CM | POA: Diagnosis not present

## 2018-08-13 DIAGNOSIS — E1169 Type 2 diabetes mellitus with other specified complication: Secondary | ICD-10-CM

## 2018-08-13 DIAGNOSIS — M79671 Pain in right foot: Secondary | ICD-10-CM

## 2018-08-13 DIAGNOSIS — M79672 Pain in left foot: Secondary | ICD-10-CM

## 2018-08-13 NOTE — Progress Notes (Signed)
Subjective:  Patient ID: [name], female    DOB: 02/23/1950,  MRN: [id number]  Chief Complaint  Patient presents with  . Foot Problem    Pt states tingling pain and burning pain in both feet, plantar aspect. 7 month duration.  . Nail Problem    Pt states "abnormal toenails with calcium buildup". Pt requests nail trim.    68 y.o. female presents  for diabetic foot care. Last AMBS was unknown. Reports numbness and tingling in their feet. Reports cramping in legs and thighs. Hx of stents to both legs.   Review of Systems: Negative except as noted in the HPI. Denies N/V/F/Ch.  Past Medical History:  Diagnosis Date  . Allergy   . Anal fissure   . Anxiety   . Cataract   . Chronic headaches   . Depression   . Diverticulosis   . GERD (gastroesophageal reflux disease)   . Glaucoma   . HLD (hyperlipidemia)   . Hypertension   . Hypothyroidism   . PAD (peripheral artery disease) ([place])   . PUD (peptic ulcer disease)   . TIA (transient ischemic attack)     Current Outpatient Medications:  .  ACCU-CHEK AVIVA PLUS test strip, USE 3 TIMES DAILY TO CHECK BLOOD SUGAR., Disp: 275 each, Rfl: 1 .  Accu-Chek Softclix Lancets lancets, CHECK BLOOD SUGAR UP TO 4 TIMES DAILY, Disp: 400 each, Rfl: 1 .  acetaminophen (TYLENOL) 500 MG tablet, Take 2 tablets (1,000 mg total) by mouth every 8 (eight) hours as needed for moderate pain., Disp: 90 tablet, Rfl: 1 .  Alcohol Swabs (B-D SINGLE USE SWABS REGULAR) PADS, USE AS NEEDED AS DIRECTED, Disp: 200 each, Rfl: 1 .  amLODipine (NORVASC) 10 MG tablet, Take 1 tablet (10 mg total) by mouth daily., Disp: 90 tablet, Rfl: 0 .  amLODipine (NORVASC) 10 MG tablet, TAKE 1 TABLET (10 MG TOTAL) BY MOUTH DAILY., Disp: 90 tablet, Rfl: 0 .  atorvastatin (LIPITOR) 40 MG tablet, , Disp: , Rfl:  .  BESIVANCE 0.6 % SUSP, SHAKE LQ AND INT 1 GTT IN OD TID, Disp: , Rfl: 1 .  Blood Glucose Calibration (ACCU-CHEK AVIVA) SOLN, , Disp: , Rfl:  .  Blood Glucose  Monitoring Suppl (ACCU-CHEK AVIVA PLUS) w/Device KIT, Use to check blood sugar up to 3 times a day, Disp: 1 kit, Rfl: 0 .  Cholecalciferol (VITAMIN D3) 400 units CAPS, Take 400 Units by mouth daily., Disp: 90 capsule, Rfl: 1 .  clopidogrel (PLAVIX) 75 MG tablet, TAKE 1 TABLET DAILY FOR PERIPHERAL ARTERIAL DISEASE, Disp: 90 tablet, Rfl: 0 .  doxepin (SINEQUAN) 150 MG capsule, TAKE 1 CAPSULE AT BEDTIME, Disp: 90 capsule, Rfl: 0 .  DUREZOL 0.05 % EMUL, PLACE 1 DROP IN RIGHT EYE 3 TIMES DAILY, Disp: , Rfl: 1 .  ferrous sulfate 325 (65 FE) MG EC tablet, Take 1 tablet (325 mg total) by mouth every other day., Disp: 45 tablet, Rfl: 0 .  furosemide (LASIX) 20 MG tablet, TAKE 1 TABLET EVERY DAY, Disp: 90 tablet, Rfl: 1 .  [START ON 08/21/2018] gabapentin (NEURONTIN) 400 MG capsule, TAKE 1 CAPSULE EVERY MORNING, 1 CAPSULE AT LUNCH, AND 2 CAPSULES AT BEDTIME, Disp: 90 capsule, Rfl: 0 .  glucose blood (ACCU-CHEK AVIVA PLUS) test strip, Use to check blood sugar 3 times daily. 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  hydrocortisone acetate 0.5 % cream, Apply topically 2 (two) times daily., Disp: 30 g, Rfl: 0 .  LANTUS 100  UNIT/ML injection, , Disp: , Rfl:  .  levothyroxine (SYNTHROID, LEVOTHROID) 112 MCG tablet, Take 1 tablet daily by mouth 1 hour  before breakfast, Disp: 90 tablet, Rfl: 1 .  lisinopril-hydrochlorothiazide (ZESTORETIC) 20-25 MG tablet, TAKE 1 TABLET EVERY DAY, Disp: 90 tablet, Rfl: 1 .  metFORMIN (GLUCOPHAGE-XR) 500 MG 24 hr tablet, Take 1 tablet (500 mg total) by mouth 2 (two) times daily. Before lunch and supper, Disp: 180 tablet, Rfl: 1 .  OVER THE COUNTER MEDICATION, Take 500 mg by mouth daily. Potassium 569m, Disp: , Rfl:  .  pantoprazole (PROTONIX) 40 MG tablet, TAKE 1 TABLET TWICE DAILY, Disp: 180 tablet, Rfl: 0 .  Polyethyl Glycol-Propyl Glycol (SYSTANE OP), Place 1 drop into both eyes daily., Disp: , Rfl:  .  predniSONE (DELTASONE) 20 MG tablet, Take 3 tablets (60 mg  total) by mouth daily with breakfast., Disp: 30 tablet, Rfl: 0 .  predniSONE (DELTASONE) 20 MG tablet, Take 3 tablets (60 mg total) by mouth daily with breakfast., Disp: 7 tablet, Rfl: 0 .  sitaGLIPtin (JANUVIA) 100 MG tablet, TAKE 1 TABLET (100 MG TOTAL) BY MOUTH DAILY., Disp: 90 tablet, Rfl: 0  Social History   Tobacco Use  Smoking Status Current Every Day Smoker  . Packs/day: 0.40  . Years: 0.50  . Pack years: 0.20  . Types: Cigarettes  Smokeless Tobacco Never Used    Allergies  Allergen Reactions  . Gadolinium Derivatives Anaphylaxis and Other (See Comments)    Chest tightness  . Asa [Aspirin] Hives and Nausea And Vomiting  . Chantix [Varenicline Tartrate]     "gave me nightmares"  . Crestor [Rosuvastatin Calcium] Hives and Itching  . Penicillins Hives and Itching    Has patient had a PCN reaction causing immediate rash, facial/tongue/throat swelling, SOB or lightheadedness with hypotension: Yes Has patient had a PCN reaction causing severe rash involving mucus membranes or skin necrosis: Yes Has patient had a PCN reaction that required hospitalization No Has patient had a PCN reaction occurring within the last 10 years: No If all of the above answers are "NO", then may proceed with Cephalosporin use.    Objective:   Vitals:   08/13/18 1344  Temp: 98.2 F (36.8 C)   There is no height or weight on file to calculate BMI. Constitutional Well developed. Well nourished.  Vascular Dorsalis pedis pulses barely palpable bilaterally  Posterior tibial pulses barely palpable bilaterally  Pedal hair growth absent. Capillary refill normal to all digits.  No cyanosis or clubbing noted.  Neurologic Normal speech. Oriented to person, place, and time. Epicritic sensation to light touch grossly present bilaterally. Protective sensation with 5.07 monofilament  present bilaterally.  Dermatologic Nails elongated, thickened, dystrophic. No open wounds. No skin lesions.   Orthopedic: Normal joint ROM without pain or crepitus bilaterally. No visible deformities. POP 3rd interspace bilat   Assessment:   1. Morton neuroma, right   2. Morton neuroma, left   3. Onychomycosis of multiple toenails with type 2 diabetes mellitus and peripheral angiopathy ([place]    Plan:  Patient was evaluated and treated and all questions answered.  Diabetes with PAD, Onychomycosis -Educated on diabetic footcare. Diabetic risk level 1 -Nails x10 debrided sharply and manually with large nail nipper and rotary burr.   Procedure: Nail Debridement Rationale: Patient meets criteria for routine foot care due to PAD Type of Debridement: manual, sharp debridement. Instrumentation: Nail nipper, rotary burr. Number of Nails: 10   Interdigital Neuroma, bilaterally -Educated on etiology -Interspace injection delivered  as below. -Educated on padding and proper shoegear  Procedure: Neuroma Injection Location: Bilateral 3rd interspace Skin Prep: Alcohol. Injectate: 0.5 cc 0.5% marcaine plain, 0.5 cc dexamethasone phosphate. Disposition: Patient tolerated procedure well. Injection site dressed with a band-aid.   Return in about 6 weeks (around 09/24/2018) for Neuroma, Bilateral.

## 2018-08-13 NOTE — Telephone Encounter (Signed)
Prevention of steroid induced hyperglycemia follow up:   Steroids: Took 3 steroid tablets this am.  Blood sugar: 163 at 1453 Diabetes Medicine:januvia, metformin Hypoglycemia symptoms: this am fasting, but better now Hyperglycemia symptoms: no Plan: continue taking diabetes medicines, asteroids as ordered and checking blood sugar two times a day. Call next Monday for weekend blood sugars  Debera Lat, RD 08/13/2018 2:42 PM.

## 2018-08-16 ENCOUNTER — Ambulatory Visit: Payer: Self-pay

## 2018-08-16 NOTE — Progress Notes (Signed)
   CC: Hypertension and diabetes follow up  HPI:  [name] is a 68 y.o. with chronic htn, dm2, hypothyroidism, mdd who presented for dm follow up. Please see problem based charting for evaluation, assessment, and plan.  Past Medical History:  Diagnosis Date  . Allergy   . Anal fissure   . Anxiety   . Cataract   . Chronic headaches   . Depression   . Diverticulosis   . GERD (gastroesophageal reflux disease)   . Glaucoma   . HLD (hyperlipidemia)   . Hypertension   . Hypothyroidism   . PAD (peripheral artery disease) ([place])   . PUD (peptic ulcer disease)   . TIA (transient ischemic attack)    Review of Systems:    States that she has been itching all over lower extremities from inside, dizziness, headaches, agitation, and darker colored stool, dizziness with movement Denies nausea, vomiting, burning with urination, dysuria  Physical Exam:  Vitals:   08/17/18 1404  BP: (!) 144/71  Pulse: 89  Temp: 98.9 F (37.2 C)  SpO2: 100%  Weight: 210 lb (95.3 kg)   Physical Exam  Constitutional: She is oriented to person, place, and time. She appears well-developed and well-nourished. She appears distressed.  HENT:  Head: Normocephalic and atraumatic.  Tenderness to palpation in temporal region  Eyes: Conjunctivae are normal.  Cardiovascular: Normal rate, regular rhythm and normal heart sounds.  Respiratory: Effort normal and breath sounds normal. No respiratory distress. She has no wheezes.  GI: Soft. Bowel sounds are normal. She exhibits no distension. There is abdominal tenderness (epigastric and upper abdominal pain to palpation).  Musculoskeletal:        General: No edema.  Neurological: She is alert and oriented to person, place, and time.  Skin: She is not diaphoretic.  Psychiatric: Her speech is normal. Judgment and thought content normal. Her mood appears anxious. She is agitated and withdrawn. Cognition and memory are normal.   Assessment & Plan:   See  Encounters Tab for problem based charting.  Patient discussed with Dr. Evette Doffing

## 2018-08-16 NOTE — Telephone Encounter (Signed)
Patient called and relayed to me to send her to Dr. Amalia Hailey . Patient relayed she did not want to go to [place] buy her daughter. Referral sent Dr. Rosana Berger telephone [phone] fax [phone] .

## 2018-08-17 ENCOUNTER — Encounter: Payer: Self-pay | Admitting: Licensed Clinical Social Worker

## 2018-08-17 ENCOUNTER — Encounter: Payer: Self-pay | Admitting: Internal Medicine

## 2018-08-17 ENCOUNTER — Ambulatory Visit (INDEPENDENT_AMBULATORY_CARE_PROVIDER_SITE_OTHER): Payer: Medicare HMO | Admitting: Licensed Clinical Social Worker

## 2018-08-17 ENCOUNTER — Ambulatory Visit (INDEPENDENT_AMBULATORY_CARE_PROVIDER_SITE_OTHER): Payer: Medicare HMO | Admitting: Internal Medicine

## 2018-08-17 ENCOUNTER — Other Ambulatory Visit: Payer: Self-pay

## 2018-08-17 VITALS — BP 144/71 | HR 89 | Temp 98.9°F | Ht 61.0 in | Wt 210.0 lb

## 2018-08-17 DIAGNOSIS — F411 Generalized anxiety disorder: Secondary | ICD-10-CM

## 2018-08-17 DIAGNOSIS — I10 Essential (primary) hypertension: Secondary | ICD-10-CM

## 2018-08-17 DIAGNOSIS — E1151 Type 2 diabetes mellitus with diabetic peripheral angiopathy without gangrene: Secondary | ICD-10-CM

## 2018-08-17 DIAGNOSIS — F331 Major depressive disorder, recurrent, moderate: Secondary | ICD-10-CM

## 2018-08-17 DIAGNOSIS — F329 Major depressive disorder, single episode, unspecified: Secondary | ICD-10-CM

## 2018-08-17 DIAGNOSIS — R51 Headache: Secondary | ICD-10-CM

## 2018-08-17 DIAGNOSIS — Z8719 Personal history of other diseases of the digestive system: Secondary | ICD-10-CM

## 2018-08-17 DIAGNOSIS — R6889 Other general symptoms and signs: Secondary | ICD-10-CM | POA: Diagnosis not present

## 2018-08-17 DIAGNOSIS — Z794 Long term (current) use of insulin: Secondary | ICD-10-CM

## 2018-08-17 DIAGNOSIS — R451 Restlessness and agitation: Secondary | ICD-10-CM | POA: Diagnosis not present

## 2018-08-17 DIAGNOSIS — E039 Hypothyroidism, unspecified: Secondary | ICD-10-CM

## 2018-08-17 DIAGNOSIS — R519 Headache, unspecified: Secondary | ICD-10-CM

## 2018-08-17 DIAGNOSIS — Z79899 Other long term (current) drug therapy: Secondary | ICD-10-CM

## 2018-08-17 DIAGNOSIS — K922 Gastrointestinal hemorrhage, unspecified: Secondary | ICD-10-CM | POA: Diagnosis not present

## 2018-08-17 DIAGNOSIS — R10816 Epigastric abdominal tenderness: Secondary | ICD-10-CM

## 2018-08-17 DIAGNOSIS — K5520 Angiodysplasia of colon without hemorrhage: Secondary | ICD-10-CM | POA: Insufficient documentation

## 2018-08-17 DIAGNOSIS — R251 Tremor, unspecified: Secondary | ICD-10-CM

## 2018-08-17 MED ORDER — SERTRALINE HCL 25 MG PO TABS: 25.0000 mg | ORAL_TABLET | Freq: Every day | ORAL | 0 refills | Status: DC

## 2018-08-17 NOTE — Assessment & Plan Note (Signed)
The patient's last a1c=6.2 in June 2020. The patient's home blood glucose measurements over the past month have ranged 110/230s. The patient is currently taking metformin 500mg  bid, lantus  On an as needed basis depending on blood glucose level. 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 She has not had any significant weight changes  Assessment and plan  -continue current regimen  -will follow up a1c at next visit

## 2018-08-17 NOTE — Assessment & Plan Note (Signed)
Patient living in a squalid apartment that has lots of mold. I spoke to her about possibly moving to her daughters place, but she said that is not an option at this time and she will try to move in October. She was given a bag of food to take home.   Patient with abdominal tenderness to palpation in epigastric and upper abdominal area. She states that she has noticed darker colored stools. No pain with defecation. Iron studies from one month ago show iron 33, iron sat 8, ferritin 15 and hb has dropped to 10 over the past 1 year from prior 12-14.  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  Assessment and plan Patient with labs (anemia, low iron) and symptoms (dizziness) suggestive of possible gi bleed.   -Repeated cbc to check hb -Will refer to gastroenterology for further workup of possible gi bleed

## 2018-08-17 NOTE — Assessment & Plan Note (Signed)
Patient has been having occasional tremors that have been ongoing since 2016. She was seen by Dr. 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   Assessment and plan   -Will follow up on EEG, MRI brain, and MRA head

## 2018-08-17 NOTE — Assessment & Plan Note (Signed)
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 She will get biopsy later this month.

## 2018-08-17 NOTE — Assessment & Plan Note (Signed)
Patient got mail from insurance company that doxepin is on back order. I have prescribed sertraline 25mg  qd along with melatonin over the counter for the patient instead.

## 2018-08-17 NOTE — Patient Instructions (Addendum)
It was a pleasure to see you today Ms. Goldenstein. Please make the following changes:  -Please make sure you follow up with neurology appointments and get EEG, MRI, MRA head completed  -Please continue to take all the medication as indicated on this form  -I have referred you to gastroenterology to follow up on your gi bleeding -Follow up in September 2020  If you have any questions or concerns, please call our clinic at [phone] between 9am-5pm and after hours call [phone] and ask for the internal medicine resident on call. If you feel you are having a medical emergency please call 911.   Thank you, we look forward to help you remain healthy!  Lars Mage, MD Internal Medicine PGY2
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
## 2018-08-17 NOTE — Assessment & Plan Note (Signed)
The patient's blood pressure during this visit was 144/71. The patient is currently taking lisinopril-hctz 20-25mg  qd, amlodipine 10mg  qd. His last blood pressure visits are   BP Readings from Last 3 Encounters:  08/17/18 (!) 144/71  07/27/18 (!) 142/87  07/22/18 (!) 151/91   The patient does not report palpitations, dizziness, chest pain, sob.  Assessment and Plan -continue current blood pressure regimen

## 2018-08-18 ENCOUNTER — Encounter: Payer: Self-pay | Admitting: Internal Medicine

## 2018-08-18 LAB — CBC
Hematocrit: 32.9 % — ABNORMAL LOW (ref 34.0–46.6)
Hemoglobin: 10.1 g/dL — ABNORMAL LOW (ref 11.1–15.9)
MCH: 24.7 pg — ABNORMAL LOW (ref 26.6–33.0)
MCHC: 30.7 g/dL — ABNORMAL LOW (ref 31.5–35.7)
MCV: 80 fL (ref 79–97)
Platelets: 296 10*3/uL (ref 150–450)
RBC: 4.09 x10E6/uL (ref 3.77–5.28)
RDW: 16.7 % — ABNORMAL HIGH (ref 11.7–15.4)
WBC: 11.2 10*3/uL — ABNORMAL HIGH (ref 3.4–10.8)

## 2018-08-18 NOTE — Telephone Encounter (Signed)
Prevention of steroid induced hyperglycemia follow up:   Steroids:Taking 3 steroid pills a day Blood sugar: Friday 163, sat- 153, 143, 130, sunday- 140, 145 Monday 123, 135; Tuesday- 150, Wednesday 156-  Diabetes Medicine:januvia, metformin Hypoglycemia symptoms: no Hyperglycemia symptoms: no but knows the signs and symptoms  plan- no further follow up, told her she can decrease checking her sugar to once a day, asked her to call if blood sugar stays above 200 mg/dl while on steroids [name], RD 08/18/2018 3:38 PM.

## 2018-08-18 NOTE — Addendum Note (Signed)
Addended by: Lalla Brothers T on: 08/18/2018 08:35 AM   Modules accepted: Level of Service

## 2018-08-18 NOTE — Telephone Encounter (Signed)
Patient is scheduled with Dr. Rosana Berger 09/13/2018 . Patient is aware. [phone] .

## 2018-08-18 NOTE — Progress Notes (Signed)
Internal Medicine Clinic Attending  Case discussed with Dr. Chundi at the time of the visit.  We reviewed the resident's history and exam and pertinent patient test results.  I agree with the assessment, diagnosis, and plan of care documented in the resident's note. 

## 2018-08-19 ENCOUNTER — Ambulatory Visit: Payer: Self-pay | Admitting: *Deleted

## 2018-08-19 ENCOUNTER — Other Ambulatory Visit: Payer: Medicare HMO

## 2018-08-19 NOTE — Telephone Encounter (Signed)
Thank you for working with her!

## 2018-08-24 ENCOUNTER — Telehealth: Payer: Self-pay | Admitting: *Deleted

## 2018-08-24 ENCOUNTER — Encounter: Payer: Self-pay | Admitting: Licensed Clinical Social Worker

## 2018-08-24 ENCOUNTER — Other Ambulatory Visit: Payer: Self-pay

## 2018-08-24 ENCOUNTER — Ambulatory Visit (INDEPENDENT_AMBULATORY_CARE_PROVIDER_SITE_OTHER): Payer: Medicare HMO | Admitting: Licensed Clinical Social Worker

## 2018-08-24 DIAGNOSIS — F331 Major depressive disorder, recurrent, moderate: Secondary | ICD-10-CM

## 2018-08-24 DIAGNOSIS — R6889 Other general symptoms and signs: Secondary | ICD-10-CM | POA: Diagnosis not present
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
## 2018-08-24 NOTE — Telephone Encounter (Signed)
Pt ask to see dr Maricela Bo while in clinic to see ashtonh.

## 2018-08-26 ENCOUNTER — Other Ambulatory Visit: Payer: Self-pay | Admitting: *Deleted

## 2018-08-26 ENCOUNTER — Encounter: Payer: Self-pay | Admitting: *Deleted
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
## 2018-08-27 ENCOUNTER — Other Ambulatory Visit: Payer: Self-pay | Admitting: *Deleted

## 2018-08-27 ENCOUNTER — Other Ambulatory Visit: Payer: Self-pay | Admitting: Internal Medicine

## 2018-08-27 NOTE — Patient Outreach (Signed)
[place] [facility]) Care Management  08/27/2018  JERMIAH HOWTON September 21, 1950 [id number]   CSW spoke with pt who is doing better; "less anxiety" and overall feeling good about things.  The exterminator eliminated the bugs which has helped with her anxiety as well as "accepting the move" to [facility] where she will be closer to her family.  "I wish to be closer to them" and this gives her motivation and reassurance of her plans.   Pt still has several medical appointments coming up this month for EEG, sleep study, possible biopsy, etc.  "I am seeing my counselor every 2 weeks which is helping".  Pt wants to apply for Medicaid and CSW instructed her how to do so online- she plans to do this soon.  CSW offered support and encouragement and will plan a follow up call in 2-3 weeks.   Eduard Clos, MSW, LCSW Clinical Social Worker  Vieques Clifton, MSW, [place] Worker  [place] [phone]

## 2018-08-30 ENCOUNTER — Other Ambulatory Visit: Payer: Self-pay

## 2018-08-30 ENCOUNTER — Ambulatory Visit (INDEPENDENT_AMBULATORY_CARE_PROVIDER_SITE_OTHER): Payer: Medicare HMO

## 2018-08-30 DIAGNOSIS — F445 Conversion disorder with seizures or convulsions: Secondary | ICD-10-CM

## 2018-08-30 DIAGNOSIS — R6889 Other general symptoms and signs: Secondary | ICD-10-CM | POA: Diagnosis not present

## 2018-09-02 ENCOUNTER — Encounter: Payer: Medicare HMO | Admitting: Vascular Surgery

## 2018-09-07 ENCOUNTER — Ambulatory Visit: Payer: Medicare HMO | Admitting: Licensed Clinical Social Worker

## 2018-09-07 ENCOUNTER — Ambulatory Visit: Payer: Medicare HMO | Admitting: Vascular Surgery

## 2018-09-07 NOTE — Procedures (Signed)
    History:  [name] is a 68 year old patient with a history of intermittent tremors.  The patient will experience tremors and episodes where she feels numb all over her body, she can see but cannot hear and she cannot move.  She has no alteration of awareness.  The patient is being evaluated for these events.  This is a routine EEG.  No skull defects are noted.  Medications include Norvasc, Plavix, doxepin, Lasix, gabapentin, Januvia, Synthroid, Zestoretic, metformin, Protonix, and vitamin D.  EEG classification: Normal awake  Description of the recording: The background rhythms of this recording consists of a fairly well modulated medium amplitude alpha rhythm of 9 Hz that is reactive to eye opening and closure. As the record progresses, the patient appears to remain in the waking state throughout the recording. Photic stimulation was performed, resulting in a bilateral and symmetric photic driving response. Hyperventilation was also performed, resulting in a minimal buildup of the background rhythm activities without significant slowing seen. At no time during the recording does there appear to be evidence of spike or spike wave discharges or evidence of focal slowing. EKG monitor shows no evidence of cardiac rhythm abnormalities with a heart rate of 78.  Impression: This is a normal EEG recording in the waking state. No evidence of ictal or interictal discharges are seen.

## 2018-09-08 ENCOUNTER — Telehealth: Payer: Self-pay | Admitting: *Deleted

## 2018-09-08 ENCOUNTER — Other Ambulatory Visit: Payer: Self-pay | Admitting: Internal Medicine

## 2018-09-08 DIAGNOSIS — K219 Gastro-esophageal reflux disease without esophagitis: Secondary | ICD-10-CM

## 2018-09-08 NOTE — Telephone Encounter (Signed)
-----   Message from Melvenia Beam, MD sent at 09/07/2018 12:09 PM EDT ----- EEG normal thanks

## 2018-09-08 NOTE — Telephone Encounter (Signed)
Spoke with pt. She understands the EEG results were normal, no seizure activity noted. Her questions were answered.

## 2018-09-13 ENCOUNTER — Ambulatory Visit: Payer: Self-pay | Admitting: *Deleted

## 2018-09-13 DIAGNOSIS — G4089 Other seizures: Secondary | ICD-10-CM | POA: Diagnosis not present

## 2018-09-13 DIAGNOSIS — R6889 Other general symptoms and signs: Secondary | ICD-10-CM | POA: Diagnosis not present

## 2018-09-13 DIAGNOSIS — D509 Iron deficiency anemia, unspecified: Secondary | ICD-10-CM | POA: Diagnosis not present

## 2018-09-13 DIAGNOSIS — E1151 Type 2 diabetes mellitus with diabetic peripheral angiopathy without gangrene: Secondary | ICD-10-CM | POA: Diagnosis not present

## 2018-09-13 DIAGNOSIS — F329 Major depressive disorder, single episode, unspecified: Secondary | ICD-10-CM | POA: Diagnosis not present

## 2018-09-13 DIAGNOSIS — F17210 Nicotine dependence, cigarettes, uncomplicated: Secondary | ICD-10-CM | POA: Diagnosis not present

## 2018-09-13 DIAGNOSIS — Z7902 Long term (current) use of antithrombotics/antiplatelets: Secondary | ICD-10-CM | POA: Diagnosis not present

## 2018-09-13 DIAGNOSIS — F449 Dissociative and conversion disorder, unspecified: Secondary | ICD-10-CM | POA: Insufficient documentation

## 2018-09-13 DIAGNOSIS — I10 Essential (primary) hypertension: Secondary | ICD-10-CM | POA: Diagnosis not present

## 2018-09-13 DIAGNOSIS — G40804 Other epilepsy, intractable, without status epilepticus: Secondary | ICD-10-CM | POA: Diagnosis not present

## 2018-09-13 DIAGNOSIS — K219 Gastro-esophageal reflux disease without esophagitis: Secondary | ICD-10-CM | POA: Diagnosis not present

## 2018-09-13 DIAGNOSIS — E039 Hypothyroidism, unspecified: Secondary | ICD-10-CM | POA: Diagnosis not present

## 2018-09-15 ENCOUNTER — Ambulatory Visit: Payer: Medicare HMO | Admitting: Internal Medicine

## 2018-09-15 MED ORDER — CLOPIDOGREL BISULFATE 75 MG PO TABS
75.00 | ORAL_TABLET | ORAL | Status: DC
Start: 2018-09-16 — End: 2018-09-15

## 2018-09-15 MED ORDER — GABAPENTIN 100 MG PO CAPS
200.00 | ORAL_CAPSULE | ORAL | Status: DC
Start: 2018-09-15 — End: 2018-09-15

## 2018-09-15 MED ORDER — PREDNISONE 20 MG PO TABS
40.00 | ORAL_TABLET | ORAL | Status: DC
Start: 2018-09-16 — End: 2018-09-15

## 2018-09-15 MED ORDER — GENERIC EXTERNAL MEDICATION
Status: DC
Start: ? — End: 2018-09-15

## 2018-09-15 MED ORDER — SODIUM CHLORIDE 0.9 % IV SOLN
10.00 | INTRAVENOUS | Status: DC
Start: ? — End: 2018-09-15

## 2018-09-15 MED ORDER — SERTRALINE HCL 25 MG PO TABS
25.00 | ORAL_TABLET | ORAL | Status: DC
Start: 2018-09-16 — End: 2018-09-15

## 2018-09-15 MED ORDER — INSULIN LISPRO 100 UNIT/ML ~~LOC~~ SOLN
1.00 | SUBCUTANEOUS | Status: DC
Start: ? — End: 2018-09-15

## 2018-09-15 MED ORDER — INSULIN LISPRO 100 UNIT/ML ~~LOC~~ SOLN
1.00 | SUBCUTANEOUS | Status: DC
Start: 2018-09-15 — End: 2018-09-15

## 2018-09-15 MED ORDER — ACETAMINOPHEN 500 MG PO TABS
1000.00 | ORAL_TABLET | ORAL | Status: DC
Start: ? — End: 2018-09-15

## 2018-09-15 MED ORDER — NICOTINE 7 MG/24HR TD PT24
1.00 | MEDICATED_PATCH | TRANSDERMAL | Status: DC
Start: 2018-09-16 — End: 2018-09-15

## 2018-09-15 MED ORDER — BAYER WOMENS 81-300 MG PO TABS
40.00 | ORAL_TABLET | ORAL | Status: DC
Start: 2018-09-15 — End: 2018-09-15

## 2018-09-15 MED ORDER — GENERIC EXTERNAL MEDICATION
Status: DC
Start: 2018-09-16 — End: 2018-09-15

## 2018-09-15 MED ORDER — LORAZEPAM 2 MG/ML IJ SOLN
1.00 | INTRAMUSCULAR | Status: DC
Start: ? — End: 2018-09-15

## 2018-09-15 MED ORDER — METFORMIN HCL ER 500 MG PO TB24
500.00 | ORAL_TABLET | ORAL | Status: DC
Start: 2018-09-15 — End: 2018-09-15

## 2018-09-15 MED ORDER — FP STAY AWAKE PO
20.00 | ORAL | Status: DC
Start: ? — End: 2018-09-15

## 2018-09-15 MED ORDER — MIDAZOLAM HCL 5 MG/ML IJ SOLN
10.00 | INTRAMUSCULAR | Status: DC
Start: ? — End: 2018-09-15

## 2018-09-15 MED ORDER — FERROUS SULFATE 325 (65 FE) MG PO TABS
325.00 | ORAL_TABLET | ORAL | Status: DC
Start: 2018-09-17 — End: 2018-09-15

## 2018-09-15 MED ORDER — RICOLA HERB MT
80.00 | OROMUCOSAL | Status: DC
Start: ? — End: 2018-09-15

## 2018-09-15 MED ORDER — LEVOTHYROXINE SODIUM 112 MCG PO TABS
112.00 | ORAL_TABLET | ORAL | Status: DC
Start: 2018-09-16 — End: 2018-09-15

## 2018-09-16 DIAGNOSIS — F445 Conversion disorder with seizures or convulsions: Secondary | ICD-10-CM | POA: Insufficient documentation

## 2018-09-16 MED ORDER — GENERIC EXTERNAL MEDICATION
Status: DC
Start: ? — End: 2018-09-16

## 2018-09-17 ENCOUNTER — Ambulatory Visit: Payer: Medicare HMO | Admitting: Vascular Surgery

## 2018-09-20 NOTE — Progress Notes (Signed)
   CC: Diabetes follow up  HPI:  [name] is a 68 y.o. female with chronic hypertension, diabetes mellitus 2, PAD, MDD who presents for diabetes follow up. Please see problem based charting for evaluation, assessment, and plan.  Past Medical History:  Diagnosis Date  . Allergy   . Anal fissure   . Anxiety   . Blurry vision 09/19/2014  . Cataract   . Chronic headaches   . Depression   . Diverticulosis   . Estrogen deficiency 02/05/2018  . GERD (gastroesophageal reflux disease)   . Glaucoma   . HLD (hyperlipidemia)   . Hypertension   . Hypothyroidism   . Infestation by bed bug 07/29/2018   - hydrocodone acetate for itching - provided information on how to treat for bed bugs  - letter provided to help her obtain assistance for moving.   Marland Kitchen PAD (peripheral artery disease) ([place])   . PUD (peptic ulcer disease)   . TIA (transient ischemic attack)   . Type 2 diabetes mellitus with complication ([place]) [id number]   Review of Systems:    Review of Systems  Constitutional: Positive for malaise/fatigue. Negative for chills and fever.  Respiratory: Negative for cough and shortness of breath.   Cardiovascular: Positive for chest pain. Negative for palpitations.  Gastrointestinal: Positive for abdominal pain. Negative for nausea and vomiting.  Neurological: Positive for dizziness and headaches.  Psychiatric/Behavioral: Positive for depression. The patient is nervous/anxious.    Physical Exam:  Vitals:   09/21/18 1051 09/21/18 1053  BP:  (!) 147/77  Pulse:  80  Temp:  98.6 F (37 C)  TempSrc:  Oral  SpO2:  100%  Weight: 212 lb 3.2 oz (96.3 kg)   Height: 5' 1.25" (1.556 m)    Physical Exam  Constitutional: She is oriented to person, place, and time. She appears well-developed and well-nourished. She appears distressed.  HENT:  Head: Normocephalic and atraumatic.  Eyes: Conjunctivae are normal.  Cardiovascular: Normal rate, regular rhythm and normal heart sounds.   Respiratory: Effort normal and breath sounds normal. No respiratory distress. She has no wheezes.  GI: Soft. Bowel sounds are normal. She exhibits no distension. There is abdominal tenderness (epigastric pain).  Musculoskeletal:        General: No edema.  Neurological: She is alert and oriented to person, place, and time.  Skin: She is not diaphoretic.  Psychiatric: Her mood appears anxious. She exhibits a depressed mood.   Assessment & Plan:   See Encounters Tab for problem based charting.  Patient discussed with Dr. Philipp Ovens

## 2018-09-21 ENCOUNTER — Other Ambulatory Visit: Payer: Self-pay

## 2018-09-21 ENCOUNTER — Encounter: Payer: Self-pay | Admitting: Internal Medicine

## 2018-09-21 ENCOUNTER — Ambulatory Visit (INDEPENDENT_AMBULATORY_CARE_PROVIDER_SITE_OTHER): Payer: Medicare HMO | Admitting: Internal Medicine

## 2018-09-21 ENCOUNTER — Other Ambulatory Visit: Payer: Self-pay | Admitting: *Deleted

## 2018-09-21 ENCOUNTER — Ambulatory Visit: Payer: Self-pay | Admitting: *Deleted

## 2018-09-21 VITALS — BP 147/77 | HR 80 | Temp 98.6°F | Ht 61.25 in | Wt 212.2 lb

## 2018-09-21 DIAGNOSIS — I10 Essential (primary) hypertension: Secondary | ICD-10-CM

## 2018-09-21 DIAGNOSIS — E039 Hypothyroidism, unspecified: Secondary | ICD-10-CM

## 2018-09-21 DIAGNOSIS — R51 Headache: Secondary | ICD-10-CM | POA: Diagnosis not present

## 2018-09-21 DIAGNOSIS — E1151 Type 2 diabetes mellitus with diabetic peripheral angiopathy without gangrene: Secondary | ICD-10-CM | POA: Diagnosis not present

## 2018-09-21 DIAGNOSIS — F332 Major depressive disorder, recurrent severe without psychotic features: Secondary | ICD-10-CM | POA: Diagnosis not present

## 2018-09-21 DIAGNOSIS — Z7984 Long term (current) use of oral hypoglycemic drugs: Secondary | ICD-10-CM

## 2018-09-21 DIAGNOSIS — R079 Chest pain, unspecified: Secondary | ICD-10-CM

## 2018-09-21 DIAGNOSIS — R519 Headache, unspecified: Secondary | ICD-10-CM

## 2018-09-21 DIAGNOSIS — R1013 Epigastric pain: Secondary | ICD-10-CM | POA: Diagnosis not present

## 2018-09-21 DIAGNOSIS — Z79890 Hormone replacement therapy (postmenopausal): Secondary | ICD-10-CM

## 2018-09-21 DIAGNOSIS — R42 Dizziness and giddiness: Secondary | ICD-10-CM

## 2018-09-21 DIAGNOSIS — F419 Anxiety disorder, unspecified: Secondary | ICD-10-CM

## 2018-09-21 DIAGNOSIS — Z7952 Long term (current) use of systemic steroids: Secondary | ICD-10-CM

## 2018-09-21 DIAGNOSIS — Z0000 Encounter for general adult medical examination without abnormal findings: Secondary | ICD-10-CM

## 2018-09-21 DIAGNOSIS — F445 Conversion disorder with seizures or convulsions: Secondary | ICD-10-CM

## 2018-09-21 DIAGNOSIS — Z79899 Other long term (current) drug therapy: Secondary | ICD-10-CM

## 2018-09-21 LAB — POCT GLYCOSYLATED HEMOGLOBIN (HGB A1C): Hemoglobin A1C: 6.6 % — AB (ref 4.0–5.6)

## 2018-09-21 LAB — GLUCOSE, CAPILLARY: Glucose-Capillary: 111 mg/dL — ABNORMAL HIGH (ref 70–99)

## 2018-09-21 MED ORDER — DULOXETINE HCL 20 MG PO CPEP: 40.0000 mg | ORAL_CAPSULE | Freq: Every day | ORAL | 0 refills | Status: DC

## 2018-09-21 MED ORDER — TRAZODONE HCL 50 MG PO TABS: 50.0000 mg | ORAL_TABLET | Freq: Every day | ORAL | 0 refills | Status: DC
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
## 2018-09-21 NOTE — Assessment & Plan Note (Signed)
The patient's last a1c=6.2 in June 2020 and today a1c 6.6. The patient's home  blood glucose measurements over the past month have ranged 90s-280s. The patient does/does not note episodes of hypoglycemia.   The patient is currently taking sitagliptin 100mg  qd, metformin 500mg  bid. The patient is compliant with medication. Patient has gained 2lbs over the past 1 month.   Assessment and plan  -continue current regimen and lifestyle modifications

## 2018-09-21 NOTE — Patient Instructions (Addendum)
It was a pleasure to see you today Ms. Seales. Please make the following changes:  Stop sertraline Start trazodone 50mg  nightly  Start duloxetine 40mg  daily  Follow with vascular surgery for biopsy of temporal artery  Continue prednisone till biopsy is completed  Make weekly appointments with [facility]  If you have any questions or concerns, please call our clinic at [phone] between 9am-5pm and after hours call [phone] and ask for the internal medicine resident on call. If you feel you are having a medical emergency please call 911.   Thank you, we look forward to help you remain healthy!  Lars Mage, MD Internal Medicine PGY3

## 2018-09-21 NOTE — Assessment & Plan Note (Signed)
Patient refused influenza vaccine during this visit

## 2018-09-21 NOTE — Assessment & Plan Note (Signed)
  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 She was started on prednisone 60mg  qd during that visit and a temporal artery biopsy was ordered urgently. The patient unfortunately took herself off of prednisone temporarily due to concern that it is increasing her glucose levels. She was restarted during hospitalization in September 2020 at a lower dose of prednisone 20mg  qd.   -contacted vascular surgery regarding obtaining temporal artery biopsy  -continue prednisone 20mg  qd

## 2018-09-21 NOTE — Assessment & Plan Note (Signed)
Patient has severe anxiety and depression. GAD7 16 and phq9 19. Patient was placed on sertraline 25mg  qd at last visit, but she states that her symptoms have not improved and she has been having graphic dreams about family members dying.   Assessment and plan  Will stop sertraline and start the patient on duloxetine 40mg  qd and trazodone 50mg  qhs to assist with sleep. Patient will continue to follow with behavioral health Dessie Coma). Follow up in 4 weeks.

## 2018-09-21 NOTE — Patient Outreach (Signed)
[place] [facility]) Care Management  09/21/2018  [name] Mar 13, 1950 [id number]   CSW attempted to reach pt by phone today and was unable but left a HIPPA compliant voice message,  CSW will plan a follow up outreach attempt in 3-4 business days if no return call is received.     Eduard Clos, MSW, [place] Worker  [place] [phone]

## 2018-09-21 NOTE — Assessment & Plan Note (Signed)
Last tsh 2.85 in July 2020. Patient is currently on levothyroxine 165mcg qd

## 2018-09-21 NOTE — Assessment & Plan Note (Signed)
The patient's blood pressure during this visit was 147/77. The patient is currently taking amlodipine 10mg  qd, lisinopril-hctz 20-25mg  qd. His last blood pressure visits are   BP Readings from Last 3 Encounters:  09/21/18 (!) 147/77  08/17/18 (!) 144/71  07/27/18 (!) 142/87   Patient states that she has not been taking amlodipine due to her thinking that it causes itching.   Assessment and Plan Counseled patient about making sure that she is adherent to amlodipine 10mg  qd and lisinopril-hctz 20-25mg  qd.

## 2018-09-22 ENCOUNTER — Other Ambulatory Visit: Payer: Self-pay | Admitting: Internal Medicine

## 2018-09-22 NOTE — Progress Notes (Signed)
Internal Medicine Clinic Attending  Case discussed with Dr. Chundi at the time of the visit.  We reviewed the resident's history and exam and pertinent patient test results.  I agree with the assessment, diagnosis, and plan of care documented in the resident's note. 

## 2018-09-24 ENCOUNTER — Other Ambulatory Visit: Payer: Self-pay | Admitting: *Deleted

## 2018-09-24 ENCOUNTER — Other Ambulatory Visit: Payer: Self-pay

## 2018-09-24 ENCOUNTER — Ambulatory Visit (INDEPENDENT_AMBULATORY_CARE_PROVIDER_SITE_OTHER): Payer: Medicare HMO | Admitting: Podiatry

## 2018-09-24 DIAGNOSIS — G5763 Lesion of plantar nerve, bilateral lower limbs: Secondary | ICD-10-CM

## 2018-09-24 DIAGNOSIS — R6889 Other general symptoms and signs: Secondary | ICD-10-CM | POA: Diagnosis not present

## 2018-09-24 NOTE — Patient Outreach (Signed)
[place] [facility]) Care Management  09/24/2018  [name] Feb 11, 1950 [id number]   [place] up/Outreach  Referral Date:06/09/2017 Referral Source:Transfer from [place] Reason for Referral:Continued Disease Management Education Insurance:[employer] Medicare   Outreach Attempt:  Outreach attempt #1 to patient for follow up. No answer. RN Health Coach left HIPAA compliant voicemail message along with contact information.  Plan:  RN Health Coach will attempt another outreach to patient within the month of September.  [place] [phone] [email or web address]

## 2018-09-27 ENCOUNTER — Other Ambulatory Visit: Payer: Self-pay | Admitting: *Deleted

## 2018-09-28 ENCOUNTER — Encounter: Payer: Self-pay | Admitting: *Deleted

## 2018-09-28 NOTE — Patient Outreach (Signed)
[facility]) Care Management  09/28/2018  ZAYLIANA PARADY Sep 08, 1950 [id number]   CSW was unable to reach pt by phone and left a HIPPA compliant voice message. CSW will send pt an unsuccessful outreach letter and try again in the 3 outreach attempts in 10 business days protocol.  Eduard Clos, MSW, [place] Worker  [place] [phone]

## 2018-09-29 ENCOUNTER — Other Ambulatory Visit: Payer: Self-pay

## 2018-09-29 ENCOUNTER — Other Ambulatory Visit: Payer: Self-pay | Admitting: *Deleted

## 2018-09-29 DIAGNOSIS — M5432 Sciatica, left side: Secondary | ICD-10-CM

## 2018-09-29 DIAGNOSIS — M5431 Sciatica, right side: Secondary | ICD-10-CM

## 2018-09-29 MED ORDER — GABAPENTIN 400 MG PO CAPS: ORAL_CAPSULE | ORAL | 0 refills | Status: DC

## 2018-09-29 NOTE — Telephone Encounter (Signed)
Request in new encounter 

## 2018-09-29 NOTE — Telephone Encounter (Signed)
Also wants refill of trazodone sent to [facility] but gabapentin to [facility]

## 2018-09-29 NOTE — Telephone Encounter (Signed)
Requesting to speak with a nurse about dosage on the gabapentin (NEURONTIN) 400 MG capsule. Please call pt back.

## 2018-10-01 ENCOUNTER — Encounter: Payer: Self-pay | Admitting: Vascular Surgery

## 2018-10-01 ENCOUNTER — Other Ambulatory Visit: Payer: Self-pay

## 2018-10-01 ENCOUNTER — Ambulatory Visit (INDEPENDENT_AMBULATORY_CARE_PROVIDER_SITE_OTHER): Payer: Medicare HMO | Admitting: Vascular Surgery

## 2018-10-01 VITALS — BP 113/69 | HR 80 | Temp 97.7°F | Resp 16 | Ht 61.0 in | Wt 209.0 lb

## 2018-10-01 DIAGNOSIS — M316 Other giant cell arteritis: Secondary | ICD-10-CM

## 2018-10-01 NOTE — Progress Notes (Signed)
Patient ID: [name], female   DOB: [birth year], 68 y.o.   MRN: [id number]  Reason for Consult: New Patient (Initial Visit) (left temoral artery biopsy)   Referred by Lucious Groves, DO  Subjective:     HPI:  [name] is a 68 y.o. female well-known to me from previous SFA intervention.  Now has new onset headaches with elevated sed rate.  She has been on steroids.  There is some concern for temporal arteritis.  She does have tenderness over her left temporal area.  No visual changes.  No right temporal tenderness.  Past Medical History:  Diagnosis Date  . Allergy   . Anal fissure   . Anxiety   . Blurry vision 09/19/2014  . Cataract   . Chronic headaches   . Depression   . Diverticulosis   . Estrogen deficiency 02/05/2018  . GERD (gastroesophageal reflux disease)   . Glaucoma   . HLD (hyperlipidemia)   . Hypertension   . Hypothyroidism   . Infestation by bed bug 07/29/2018   - hydrocodone acetate for itching - provided information on how to treat for bed bugs  - letter provided to help her obtain assistance for moving.   Marland Kitchen PAD (peripheral artery disease) ([place])   . PUD (peptic ulcer disease)   . TIA (transient ischemic attack)   . Type 2 diabetes mellitus with complication ([place]) [id number]/94/[id number]   Family History  Problem Relation Age of Onset  . Peripheral vascular disease Mother   . Heart disease Mother   . Hypertension Mother   . AAA (abdominal aortic aneurysm) Father   . Hypertension Father   . Aneurysm Father   . Uterine cancer Sister        mets to brain  . Liver cancer Brother   . Stomach cancer Maternal Aunt        x2  . Stomach cancer Paternal Aunt   . Heart disease Brother   . Kidney cancer Brother   . Anuerysm Brother        brain  . HIV Brother        x2  . Tremor Neg Hx    Past Surgical History:  Procedure Laterality Date  . ABDOMINAL AORTAGRAM N/A 01/20/2013   Procedure: ABDOMINAL Maxcine Ham;  Surgeon: Conrad Le Sueur, MD;  Location: [facility] CATH  LAB;  Service: Cardiovascular;  Laterality: N/A;  . ABDOMINAL AORTOGRAM W/LOWER EXTREMITY N/A 06/03/2017   Procedure: ABDOMINAL AORTOGRAM W/LOWER EXTREMITY;  Surgeon: Waynetta Sandy, MD;  Location: [facility] LAB;  Service: Cardiovascular;  Laterality: N/A;  Bilateral  . ANAL FISSURE REPAIR    . AORTOGRAM    . BREAST REDUCTION SURGERY    . CATARACT EXTRACTION Left   . iliac artery angioplasty and stenting Left 12/2012  . PERIPHERAL VASCULAR ATHERECTOMY  06/03/2017   Procedure: PERIPHERAL VASCULAR ATHERECTOMY and Stent;  Surgeon: Waynetta Sandy, MD;  Location: [facility] LAB;  Service: Cardiovascular;;  Rt. SFA   . REDUCTION MAMMAPLASTY Bilateral   . SFA stent Left 2012   at [facility]  . STOMACH SURGERY    . TOTAL THYROIDECTOMY      Short Social History:  Social History   Tobacco Use  . Smoking status: Current Every Day Smoker    Packs/day: 0.40    Years: 0.50    Pack years: 0.20    Types: Cigarettes  . Smokeless tobacco: Never Used  . Tobacco comment: .5 PPD  Substance Use Topics  . Alcohol use: No    Alcohol/week: 0.0 standard drinks    Allergies  Allergen Reactions  . Gadolinium Derivatives Anaphylaxis and Other (See Comments)    Chest tightness  . Asa [Aspirin] Hives and Nausea And Vomiting  . Chantix [Varenicline Tartrate]     "gave me nightmares"  . Crestor [Rosuvastatin Calcium] Hives and Itching  . Penicillins Hives and Itching    Has patient had a PCN reaction causing immediate rash, facial/tongue/throat swelling, SOB or lightheadedness with hypotension: Yes Has patient had a PCN reaction causing severe rash involving mucus membranes or skin necrosis: Yes Has patient had a PCN reaction that required hospitalization No Has patient had a PCN reaction occurring within the last 10 years: No If all of the above answers are "NO", then may proceed with Cephalosporin use.     Current Outpatient Medications   Medication Sig Dispense Refill  . ACCU-CHEK AVIVA PLUS test strip USE 3 TIMES DAILY TO CHECK BLOOD SUGAR. 275 each 1  . Accu-Chek Softclix Lancets lancets CHECK BLOOD SUGAR UP TO 4 TIMES DAILY 400 each 1  . acetaminophen (TYLENOL) 500 MG tablet Take 2 tablets (1,000 mg total) by mouth every 8 (eight) hours as needed for moderate pain. 90 tablet 1  . Alcohol Swabs (B-D SINGLE USE SWABS REGULAR) PADS USE AS NEEDED AS DIRECTED 200 each 1  . amLODipine (NORVASC) 10 MG tablet TAKE 1 TABLET EVERY DAY 90 tablet 0  . atorvastatin (LIPITOR) 40 MG tablet TAKE 1 TABLET EVERY DAY 90 tablet 0  . Blood Glucose Calibration (ACCU-CHEK AVIVA) SOLN     . Blood Glucose Monitoring Suppl (ACCU-CHEK AVIVA PLUS) w/Device KIT Use to check blood sugar up to 3 times a day 1 kit 0  . Cholecalciferol (VITAMIN D3) 400 units CAPS Take 400 Units by mouth daily. 90 capsule 1  . clopidogrel (PLAVIX) 75 MG tablet TAKE 1 TABLET DAILY FOR PERIPHERAL ARTERIAL DISEASE 90 tablet 0  . DULoxetine (CYMBALTA) 20 MG capsule Take 2 capsules (40 mg total) by mouth daily. 60 capsule 0  . DUREZOL 0.05 % EMUL PLACE 1 DROP IN RIGHT EYE 3 TIMES DAILY  1  . ferrous sulfate 325 (65 FE) MG EC tablet TAKE 1 TABLET EVERY OTHER DAY 45 tablet 0  . furosemide (LASIX) 20 MG tablet TAKE 1 TABLET EVERY DAY 90 tablet 1  . gabapentin (NEURONTIN) 400 MG capsule TAKE 1 CAPSULE EVERY MORNING, 1 CAPSULE AT LUNCH, AND 2 CAPSULES AT BEDTIME 90 capsule 0  . glucose blood (ACCU-CHEK AVIVA PLUS) test strip Use to check blood sugar 3 times daily. 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 hydrocortisone acetate 0.5 % cream Apply topically 2 (two) times daily. 30 g 0  . LANTUS 100 UNIT/ML injection     . levothyroxine (SYNTHROID, LEVOTHROID) 112 MCG tablet Take 1 tablet daily by mouth 1 hour  before breakfast 90 tablet 1  . lisinopril-hydrochlorothiazide (ZESTORETIC) 20-25 MG tablet TAKE 1 TABLET EVERY DAY 90 tablet 1  . metFORMIN (GLUCOPHAGE-XR) 500 MG 24 hr  tablet Take 1 tablet (500 mg total) by mouth 2 (two) times daily. Before lunch and supper 180 tablet 1  . OVER THE COUNTER MEDICATION Take 500 mg by mouth daily. Potassium 561m    . pantoprazole (PROTONIX) 40 MG tablet TAKE 1 TABLET TWICE DAILY 180 tablet 0  . Polyethyl Glycol-Propyl Glycol (SYSTANE OP) Place 1 drop into both eyes daily.    . predniSONE (DELTASONE) 20  MG tablet Take 3 tablets (60 mg total) by mouth daily with breakfast. 30 tablet 0  . sitaGLIPtin (JANUVIA) 100 MG tablet TAKE 1 TABLET (100 MG TOTAL) BY MOUTH DAILY. 90 tablet 0  . traZODone (DESYREL) 50 MG tablet Take 1 tablet (50 mg total) by mouth at bedtime. 30 tablet 0   No current facility-administered medications for this visit.     Review of Systems  Constitutional:  Constitutional negative. HENT: HENT negative.  Eyes: Eyes negative.  Respiratory: Respiratory negative.  Cardiovascular: Cardiovascular negative.  GI: Gastrointestinal negative.  Musculoskeletal: Musculoskeletal negative.  Skin: Skin negative.  Neurological: Positive for headaches.  Hematologic: Hematologic/lymphatic negative.  Psychiatric: Psychiatric negative.        Objective:  Objective   Vitals:   10/01/18 1421 10/01/18 1426  BP: 121/80 113/69  Pulse: 80 80  Resp: 16   Temp: 97.7 F (36.5 C)   TempSrc: Temporal   SpO2: 100%   Weight: 209 lb (94.8 kg)   Height: 5' 1"  (1.549 m)    Body mass index is 39.49 kg/m.  Physical Exam HENT:     Head:     Comments: Tenderness over left temporal artery, right temporal artery easily palpable no tenderness to palpation    Nose: Nose normal.     Mouth/Throat:     Mouth: Mucous membranes are moist.  Eyes:     Pupils: Pupils are equal, round, and reactive to light.  Cardiovascular:     Rate and Rhythm: Normal rate.     Pulses:          Radial pulses are 2+ on the right side and 2+ on the left side.       Femoral pulses are 2+ on the right side and 2+ on the left side. Pulmonary:      Effort: Pulmonary effort is normal.  Abdominal:     General: Abdomen is flat.     Palpations: Abdomen is soft.  Musculoskeletal: Normal range of motion.        General: No swelling.  Skin:    General: Skin is warm and dry.     Capillary Refill: Capillary refill takes less than 2 seconds.  Neurological:     General: No focal deficit present.     Mental Status: She is alert.  Psychiatric:        Mood and Affect: Mood normal.        Behavior: Behavior normal.        Thought Content: Thought content normal.        Judgment: Judgment normal.     Data: ESR: 70     Assessment/Plan:     68 year old patient well-known to me for lower extremity vascular disease now with concern for temporal arteritis.  We will begin with left temporal artery biopsy.  I discussed my role as a technician in that we may have inadequate results given that she has been on steroids.  She demonstrates good understanding we will get her set up for the near future.     Waynetta Sandy MD [facility]

## 2018-10-04 ENCOUNTER — Other Ambulatory Visit: Payer: Self-pay | Admitting: *Deleted

## 2018-10-04 NOTE — Patient Outreach (Signed)
[facility]) Care Management  10/04/2018  ADABELLE OWENSBY 11/27/1950 [id number]  CSW made contact with pt who reports "I don't have epilepsy". She is feeling better overall and pleased that her "brain is ok".  Per pt, she has seen improvement with her anxiety/panic attacks with the new medication regimen. "he is treating me with a medicine to take at night and different one in the morning.  "I see my therapist tomorrow which is helping me to talk it out".  "my health is getting more controlled and everything is ok". Per pt, she has been getting extra food stamps which has been a help.   CSW offered support and praised her for her progress and optimism. She is still hoping/planning to move closer to family (Oct/Nov).   CSW will plan a follow up call in 2-3 weeks.   Eduard Clos, MSW, [place] Worker  [place] [phone]

## 2018-10-05 ENCOUNTER — Ambulatory Visit (INDEPENDENT_AMBULATORY_CARE_PROVIDER_SITE_OTHER): Payer: Medicare HMO | Admitting: Licensed Clinical Social Worker

## 2018-10-05 ENCOUNTER — Other Ambulatory Visit: Payer: Self-pay

## 2018-10-05 DIAGNOSIS — F331 Major depressive disorder, recurrent, moderate: Secondary | ICD-10-CM

## 2018-10-07 ENCOUNTER — Encounter: Payer: Self-pay | Admitting: Internal Medicine

## 2018-10-07 ENCOUNTER — Other Ambulatory Visit: Payer: Self-pay

## 2018-10-07 ENCOUNTER — Ambulatory Visit (INDEPENDENT_AMBULATORY_CARE_PROVIDER_SITE_OTHER): Payer: Medicare HMO | Admitting: Internal Medicine

## 2018-10-07 ENCOUNTER — Telehealth: Payer: Self-pay | Admitting: Internal Medicine

## 2018-10-07 VITALS — BP 100/70 | HR 73 | Temp 97.5°F | Ht 61.0 in | Wt 214.0 lb

## 2018-10-07 DIAGNOSIS — D509 Iron deficiency anemia, unspecified: Secondary | ICD-10-CM | POA: Diagnosis not present

## 2018-10-07 DIAGNOSIS — B370 Candidal stomatitis: Secondary | ICD-10-CM | POA: Diagnosis not present

## 2018-10-07 DIAGNOSIS — Z7902 Long term (current) use of antithrombotics/antiplatelets: Secondary | ICD-10-CM | POA: Diagnosis not present

## 2018-10-07 MED ORDER — NA SULFATE-K SULFATE-MG SULF 17.5-3.13-1.6 GM/177ML PO SOLN: 1.0000 | Freq: Once | ORAL | 0 refills | Status: DC

## 2018-10-07 MED ORDER — FLUCONAZOLE 100 MG PO TABS: ORAL_TABLET | ORAL | 0 refills | Status: DC

## 2018-10-07 NOTE — Telephone Encounter (Signed)
Pt stated that she needs meds prescribed today to be sent to [facility] on [address].

## 2018-10-07 NOTE — Patient Instructions (Signed)
We have sent the following medications to your pharmacy for you to pick up at your convenience:  [place] have been scheduled for an endoscopy and colonoscopy. Please follow the written instructions given to you at your visit today. Please pick up your prep supplies at the pharmacy within the next 1-3 days. If you use inhalers (even only as needed), please bring them with you on the day of your procedure.

## 2018-10-07 NOTE — Progress Notes (Signed)
HISTORY OF PRESENT ILLNESS:  [name] is a 68 y.o. female with multiple medical problems including hypertension, diabetes mellitus with the occasional need for insulin, hypothyroidism, peripheral vascular disease on Plavix, hypothyroidism, and GERD.  She is sent today by her primary care provider [name] regarding iron deficiency anemia.  I last saw the patient in December 2015 for GERD with intermittent solid food dysphasia and colon cancer screening.  See that dictation for details.  Her pantoprazole was increased to 40 mg twice daily and colonoscopy and upper endoscopy were scheduled.  Colonoscopy revealed 2 non-adenomatous colon polyps and moderate pandiverticulosis.  Follow-up in 10 years recommended.  Upper endoscopy revealed incidental nonbleeding duodenal AVMs but was otherwise normal.  She has not been seen since.  In August 2018 the patient's hemoglobin was normal at 14.9 with normal MCV 93.6.  On August 17, 2018 she was anemic with hemoglobin 10.1.  MCV 80.  Iron deficiency confirmed with iron saturation of 8% and ferritin level of 15.  CRP was checked and returned normal.  Erythrocyte sedimentation rate was elevated at 70 (40 upper limit of normal).  For this she was given the presumptive diagnosis of giant cell arteritis and placed on prednisone which she tells me she has been on for approximately 2 months.  GI complaints today include burning discomfort in her mouth and chest.  Trouble swallowing.  She does have some belching and bloating.  No other complaints.  About 2 months ago she was placed on iron.  She was instructed to take an iron supplement once every other day.  No Hemoccult studies.  No follow-up blood work since.  Review of relevant x-rays shows CT scan from April 2010 evaluating the abdomen and pelvis.  Exam was remarkable for sigmoid diverticulosis without diverticulitis.  Otherwise negative  REVIEW OF SYSTEMS:  All non-GI ROS negative unless stated in the HPI except for  anxiety, back pain, depression, fatigue, headaches, itching, muscle cramps, sleeping problems, ankle swelling, voice change  Past Medical History:  Diagnosis Date  . Allergy   . Anal fissure   . Anxiety   . Blurry vision 09/19/2014  . Cataract   . Chronic headaches   . Depression   . Diverticulosis   . Estrogen deficiency 02/05/2018  . GERD (gastroesophageal reflux disease)   . Glaucoma   . HLD (hyperlipidemia)   . Hypertension   . Hypothyroidism   . Infestation by bed bug 07/29/2018   - hydrocodone acetate for itching - provided information on how to treat for bed bugs  - letter provided to help her obtain assistance for moving.   Marland Kitchen PAD (peripheral artery disease) ([place])   . PUD (peptic ulcer disease)   . TIA (transient ischemic attack)   . Type 2 diabetes mellitus with complication ([place]) [id number]    Past Surgical History:  Procedure Laterality Date  . ABDOMINAL AORTAGRAM N/A 01/20/2013   Procedure: ABDOMINAL Maxcine Ham;  Surgeon: Conrad , MD;  Location: [facility] C[facility] LAB;  Service: Cardiovascular;  Laterality: N/A;  . ABDOMINAL AORTOGRAM W/LOWER EXTREMITY N/A 06/03/2017   Procedure: ABDOMINAL AORTOGRAM W/LOWER EXTREMITY;  Surgeon: Waynetta Sandy, MD;  Location: [facility] LAB;  Service: Cardiovascular;  Laterality: N/A;  Bilateral  . ANAL FISSURE REPAIR    . AORTOGRAM    . BREAST REDUCTION SURGERY    . CATARACT EXTRACTION Left   . iliac artery angioplasty and stenting Left 12/2012  . PERIPHERAL VASCULAR ATHERECTOMY  06/03/2017   Procedure: PERIPHERAL VASCULAR  ATHERECTOMY and Stent;  Surgeon: Waynetta Sandy, MD;  Location: [place] [facility] LAB;  Service: Cardiovascular;;  Rt. SFA   . REDUCTION MAMMAPLASTY Bilateral   . SFA stent Left 2012   at [facility]  . STOMACH SURGERY    . TOTAL THYROIDECTOMY      Social History [name]  reports that she has been smoking cigarettes. She has a 0.20 pack-year smoking history. She has never  used smokeless tobacco. She reports previous drug use. Drug: Marijuana. She reports that she does not drink alcohol.  family history includes AAA (abdominal aortic aneurysm) in her father; Aneurysm in her father; Anuerysm in her brother; HIV in her brother; Heart disease in her brother and mother; Hypertension in her father and mother; Kidney cancer in her brother; Liver cancer in her brother; Peripheral vascular disease in her mother; Stomach cancer in her maternal aunt and paternal aunt; Uterine cancer in her sister.  Allergies  Allergen Reactions  . Gadolinium Derivatives Anaphylaxis and Other (See Comments)    Chest tightness  . Asa [Aspirin] Hives and Nausea And Vomiting  . Chantix [Varenicline Tartrate]     "gave me nightmares"  . Crestor [Rosuvastatin Calcium] Hives and Itching  . Penicillins Hives and Itching    Has patient had a PCN reaction causing immediate rash, facial/tongue/throat swelling, SOB or lightheadedness with hypotension: Yes Has patient had a PCN reaction causing severe rash involving mucus membranes or skin necrosis: Yes Has patient had a PCN reaction that required hospitalization No Has patient had a PCN reaction occurring within the last 10 years: No If all of the above answers are "NO", then may proceed with Cephalosporin use.        PHYSICAL EXAMINATION: Vital signs: BP 100/70   Pulse 73   Temp (!) 97.5 F (36.4 C)   Ht 5\' 1"  (1.549 m)   Wt 214 lb (97.1 kg)   BMI 40.43 kg/m   Constitutional: generally well-appearing, no acute distress Psychiatric: alert and oriented x3, cooperative Eyes: extraocular movements intact, anicteric, conjunctiva pink Mouth: oral pharynx moist, oral thrush present Neck: supple no lymphadenopathy Cardiovascular: heart regular rate and rhythm, no murmur Lungs: clear to auscultation bilaterally Abdomen: soft, nontender, nondistended, no obvious ascites, no peritoneal signs, normal bowel sounds, no organomegaly Rectal:  Deferred until colonoscopy Extremities: no clubbing, cyanosis, or lower extremity edema bilaterally Skin: no lesions on visible extremities Neuro: No focal deficits. No asterixis. Cranial nerves intact   ASSESSMENT:  1.  Iron deficiency anemia.  Rule out GI mucosal lesion.  Likely due to AVMs based on prior endoscopic findings. 2.  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  Colonoscopy 2016 with diverticulosis and non-adenomatous colon polyps 4.  Oral thrush 5.  GERD.  On PPI 6.  Chest burning.  Suspect esophageal candidiasis in a diabetic on prednisone 7.  Multiple medical problems including peripheral vascular disease on Plavix and diabetes mellitus 8.  Question giant cell arteritis on prednisone.  Being followed by PCP  PLAN:  1.  Prescribe Diflucan.  200 mg on day 1 then 100 mg daily for an additional 5 days 2.  Schedule colonoscopy and upper endoscopy to evaluate iron deficiency anemia.  Patient is high risk given her comorbidities and the need to address both diabetic and Plavix therapies.The nature of the procedure, as well as the risks, benefits, and alternatives were carefully and thoroughly reviewed with the patient. Ample time for discussion and questions allowed. The patient understood,  was satisfied, and agreed to proceed. 3.  Hold diabetic medications the day of the procedure to avoid unwanted hypoglycemia 4.  Hold Plavix 5 days prior to the procedure.  The pros and cons reviewed 5.  Continue iron supplementation 6.  Further recommendations after the above completed

## 2018-10-08 ENCOUNTER — Other Ambulatory Visit: Payer: Self-pay

## 2018-10-08 ENCOUNTER — Other Ambulatory Visit: Payer: Self-pay | Admitting: *Deleted

## 2018-10-08 ENCOUNTER — Encounter: Payer: Self-pay | Admitting: *Deleted

## 2018-10-08 ENCOUNTER — Inpatient Hospital Stay (HOSPITAL_COMMUNITY): Admission: RE | Admit: 2018-10-08 | Payer: Medicare HMO | Source: Ambulatory Visit

## 2018-10-08 ENCOUNTER — Encounter: Payer: Self-pay | Admitting: Licensed Clinical Social Worker

## 2018-10-08 MED ORDER — FLUCONAZOLE 100 MG PO TABS: ORAL_TABLET | ORAL | 0 refills | Status: DC

## 2018-10-08 MED ORDER — NA SULFATE-K SULFATE-MG SULF 17.5-3.13-1.6 GM/177ML PO SOLN: 1.0000 | Freq: Once | ORAL | 0 refills | Status: AC

## 2018-10-08 NOTE — Telephone Encounter (Signed)
Resent medication to [facility]

## 2018-10-08 NOTE — BH Specialist Note (Signed)
Integrated Behavioral Health Follow Up Visit  MRN: [id number] Name: [name]  Session Start time: 1:35  Session End time: 2:05 Total time: 30 minutes  Type of Service: Integrated Behavioral Health- Individual Interpretor:No.   SUBJECTIVE: [name] is a 68 y.o. female  whom attended the session individually.  Patient was referred by [name] for depression and anxiety. Patient reports the following symptoms/concerns: health concerns/issues, panic attacks, sleep disturbances, loneliness, moderate levels of depression, housing issues, and interpersonal relationship issues.  Duration of problem: recurrent since 2015; Severity of problem: moderate  OBJECTIVE: Mood: Depressed and Affect: Depressed and Tearful Risk of harm to self or others: No plan to harm self or others  LIFE CONTEXT: Family and Social: Patient processed sadness towards missing her husband whom has passed, and how she feels very isolated/alone. Patient was planning to move closer to her daughter, but she reported her daughter is not making any efforts to assist her in relocating.  Self-Care: Patient will be having surgery soon.  Life Changes: ongoing health challenges.   GOALS ADDRESSED: Patient will: 1.  Reduce symptoms of: anxiety, depression, insomnia and stress  2.  Increase knowledge and/or ability of: coping skills, healthy habits and stress reduction  3.  Demonstrate ability to: Increase healthy adjustment to current life circumstances and Increase adequate support systems for patient/family  INTERVENTIONS: Interventions utilized:  Mindfulness or Relaxation Training, Brief CBT, Supportive Counseling and Sleep Hygiene Standardized Assessments completed: assessed for SI, HI, and self-harm.   ASSESSMENT: Patient currently experiencing moderate levels of depression and anxiety. Patient's depression has increased since the last session. Since her previous session, the patient appears less hopeful that  she will be able to be around family and natural supports. Patient is unsure if her daughter wants her to move closer to her due to the lack of investment her daughter has made with identifying a potential home for the patient. Patient was offered housing resources in the county that her daughter currently lives.   Patient is anxious about the upcoming surgery she has scheduled, and feels that she has no one to care for her after her surgery. Patient is beginning to worry about the upcoming holidays, and fears that she will have to spend them alone. Patient was more tearful than in the past. Patient is denying any thoughts of hurting herself or others.   Patient's housing has improved since the last session. Patient is declining any bed bugs at this time.   Patient may benefit from counseling.  PLAN: 1. Follow up with behavioral health clinician on : one week.   Dessie Coma, [facility], [place]

## 2018-10-08 NOTE — Patient Outreach (Signed)
[name]) Care Management  [place]  10/08/2018   [name] 1950-06-15 [id number]   RN Health Coach Follow up/Outreach   Referral Date:  06/09/2017 Referral Source:  Transfer from [place]  Reason for Referral:  Continued Disease Management Education Insurance:  [facility] Medicare   Outreach Attempt:  Successful telephone outreach to patient for follow up.  HIPAA verified with patient.  Patient reporting continued feelings of depression and loneliness.  Has attended monitored EEG and understands the episodes where not seizures but related to her anxiety and depression.  Reports finishing up with her prednisone taper and states blood sugars have started to trend back down to normal.  Has orders for Lantus insulin for blood sugars >300 and patient stating she has not had to take any doses of Lantus.  Blood sugars are now ranging 90-180's.  Reports with continued Integrative Therapy and the change of her medications (to cymbalta) she has been sleeping a little better and getting some rest.  Does report she is scheduled for temporal biopsy on 10/12/2018 and is having difficulties finding a way to get to the preop testing (COVID testing) today.  States she was told by [facility] they could not take her and wait for to have testing done and she would have to wait outside for them to return.  RN Health Coach called and spoke with Nurse at [place] at [facility] and was told patient could have test done this Saturday, 10/09/2018 or Monday, 10/11/2018 to be ready for surgery on 10/12/2018.  RN Health Coach outreach to [facility] Social Nurse, learning disability for transportation assistance and she was able to arrange transportation for patient to have [place] presurgery screening completed on Monday, 10/11/2018.  Encouraged patient to seek assistance to have testing done Saturday if she could to get it done sooner rather than later.    Encounter Medications:  Outpatient  Encounter Medications as of 10/08/2018  Medication Sig Note  . clopidogrel (PLAVIX) 75 MG tablet TAKE 1 TABLET DAILY FOR PERIPHERAL ARTERIAL DISEASE (Patient taking differently: Take 75 mg by mouth at bedtime. )   . DULoxetine (CYMBALTA) 20 MG capsule Take 2 capsules (40 mg total) by mouth daily. (Patient taking differently: Take 20 mg by mouth 2 (two) times daily. )   . ferrous sulfate 325 (65 FE) MG EC tablet TAKE 1 TABLET EVERY OTHER DAY (Patient taking differently: Take 325 mg by mouth every other day. )   . fluconazole (DIFLUCAN) 100 MG tablet Take two tablets the first day, and then one tablet every day after until they are gone 10/08/2018: Hasnt started  . furosemide (LASIX) 20 MG tablet TAKE 1 TABLET EVERY DAY (Patient taking differently: Take 20 mg by mouth daily. )   . gabapentin (NEURONTIN) 400 MG capsule TAKE 1 CAPSULE EVERY MORNING, 1 CAPSULE AT LUNCH, AND 2 CAPSULES AT BEDTIME (Patient taking differently: Take 400-800 mg by mouth See admin instructions. Take 400 mg in the morning, 400 mg at lunch, and 800 mg at bedtime)   . ACCU-CHEK AVIVA PLUS test strip USE 3 TIMES DAILY TO CHECK BLOOD SUGAR.   Marland Kitchen Accu-Chek Softclix Lancets lancets CHECK BLOOD SUGAR UP TO 4 TIMES DAILY   . acetaminophen (TYLENOL) 500 MG tablet Take 2 tablets (1,000 mg total) by mouth every 8 (eight) hours as needed for moderate pain.   . Alcohol Swabs (B-D SINGLE USE SWABS REGULAR) PADS USE AS NEEDED AS DIRECTED   . amLODipine (NORVASC) 10  MG tablet TAKE 1 TABLET EVERY DAY (Patient taking differently: Take 10 mg by mouth at bedtime. )   . atorvastatin (LIPITOR) 40 MG tablet TAKE 1 TABLET EVERY DAY (Patient taking differently: Take 40 mg by mouth at bedtime. )   . Blood Glucose Calibration (ACCU-CHEK AVIVA) SOLN    . Blood Glucose Monitoring Suppl (ACCU-CHEK AVIVA PLUS) w/Device KIT Use to check blood sugar up to 3 times a day   . glucose blood (ACCU-CHEK AVIVA PLUS) test strip Use to check blood sugar 3 times daily. 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 hydrocortisone acetate 0.5 % cream Apply topically 2 (two) times daily. (Patient taking differently: Apply 1 application topically 2 (two) times daily as needed (itching). )   . LANTUS 100 UNIT/ML injection Inject 1 Units into the skin daily as needed (if blood sugar goes over 300).  10/08/2018: Pt confirmed 1 unit  . levothyroxine (SYNTHROID, LEVOTHROID) 112 MCG tablet Take 1 tablet daily by mouth 1 hour  before breakfast (Patient taking differently: Take 112 mcg by mouth daily before breakfast. )   . lisinopril-hydrochlorothiazide (ZESTORETIC) 20-25 MG tablet TAKE 1 TABLET EVERY DAY (Patient taking differently: Take 1 tablet by mouth daily. )   . metFORMIN (GLUCOPHAGE-XR) 500 MG 24 hr tablet Take 1 tablet (500 mg total) by mouth 2 (two) times daily. Before lunch and supper   . Na Sulfate-K Sulfate-Mg Sulf 17.5-3.13-1.6 GM/177ML SOLN Take 1 kit by mouth once for 1 dose.   . pantoprazole (PROTONIX) 40 MG tablet TAKE 1 TABLET TWICE DAILY (Patient taking differently: Take 40 mg by mouth 2 (two) times daily. )   . Polyethyl Glycol-Propyl Glycol (SYSTANE OP) Place 1 drop into both eyes daily as needed (dry eyes).    . predniSONE (DELTASONE) 20 MG tablet Take 3 tablets (60 mg total) by mouth daily with breakfast. (Patient taking differently: Take 20 mg by mouth daily with breakfast. )   . sitaGLIPtin (JANUVIA) 100 MG tablet TAKE 1 TABLET (100 MG TOTAL) BY MOUTH DAILY. (Patient taking differently: Take 100 mg by mouth daily. )   . traZODone (DESYREL) 50 MG tablet Take 1 tablet (50 mg total) by mouth at bedtime.   . [DISCONTINUED] Cholecalciferol (VITAMIN D3) 400 units CAPS Take 400 Units by mouth daily.   . [DISCONTINUED] OVER THE COUNTER MEDICATION Take 500 mg by mouth daily. Potassium 555m    No facility-administered encounter medications on file as of 10/08/2018.     Functional Status:  In your present state of health, do you have any difficulty performing the  following activities: 09/21/2018 07/06/2018  Hearing? Y N  Comment Only with seizures -  Vision? Y N  Difficulty concentrating or making decisions? N Y  Comment - -  Walking or climbing stairs? Y Y  Comment - -  Dressing or bathing? N N  Comment - -  Doing errands, shopping? Y NCliveand eating ? - -  Comment - -  Using the Toilet? - -  In the past six months, have you accidently leaked urine? - -  Comment - -  Do you have problems with loss of bowel control? - -  Managing your Medications? - -  Managing your Finances? - -  Housekeeping or managing your Housekeeping? - -  Comment - -  Some recent data might be hidden    Fall/Depression Screening: Fall Risk  09/21/2018 07/06/2018 07/05/2018  Falls in the past year? 1 1  1  Comment - - -  Number falls in past yr: _0 Comment 10 - about 4 falls in the month of June 2020  Injury with Fall? _1 Comment Hit head, bruised - -  Risk Factor Category  - High Risk (2 or more Points) High Risk (2 or more Points)  Risk for fall due to : History of fall(s);Impaired balance/gait;Impaired mobility Impaired balance/gait;History of fall(s);Other (Comment) History of fall(s);Impaired balance/gait;Impaired mobility;Medication side effect  Risk for fall due to: Comment Seizures Tremors. -  Follow up Falls prevention discussed Falls prevention discussed Falls evaluation completed;Education provided;Falls prevention discussed   PHQ 2/9 Scores 09/21/2018 07/06/2018 02/05/2018 01/25/2018 12/31/2017 12/25/2017 10/05/2017  PHQ - 2 Score _2 PHQ- 9 Score _3 THN CM Care Plan Problem One     Most Recent Value  Care Plan Problem One  Knowledge deficit related to self care management of diabetes and depression  Role Documenting the Problem One  [place] for Problem One  Active  THN Long Term Goal   Patient will continue to maintain A1C of 7 or below in the next 90 days.  THN Long Term Goal  Start Date  10/08/18  THN Long Term Goal Met Date  10/08/18  Interventions for Problem One Long Term Goal  Congratulated patient on her maintianing A1C below 7/goal, reviewed and discussed current care plan and goals, reviewed medications and encouraged medication compliance, reviewed current glucose ranges and discussed ways to help reduce, reviewed signs and symptoms of hypo and hyperglycemia, encouraged carbohydrate modified diet, discussed integrative therapy and medication changes to assist with sleeping and resting, encouraged patient to speak with family concerning holiday plans to help with depression and sadness, encouraged patient to continue with integrative therapy sessions, encouraged to keep and attend scheduled medical appointments, reviewed medication adjustments prior to colonoscopy and endoscopy per provider recommendations  [facility] CM Short Term Goal #1   Patient will have temporal biopsy on 10/12/2018.  [facility] CM Short Term Goal #1 Start Date  10/08/18  [facility] CM Short Term Goal #1 Met Date  10/08/18  Interventions for Short Term Goal #1  Assisted patient arranging transportation for preop testing (COVID screening), encouraged patient to attempt to have friend provide transportation to preop testing over the weekend, encouraged patient to attend scheduled biopsy     Appointments:  Last attended appointment with primary care provider on 09/21/2018.  Has scheduled temporal biopsy on 10/12/2018.  Plan: RN Health Coach consulted [facility] Social Worker for transportation assistance. RN Health Coach will make next telephone outreach to patient within the month of October. RN Health Coach will send primary care provider quarterly update.  [place] [phone] [email or web address]

## 2018-10-08 NOTE — Patient Outreach (Signed)
[facility]) Care Management  10/08/2018  [name] [birth year] [id number]   Transportation arranged via [place] for pre-surgery COVID test on Monday, 10/11/18 @ 11:00 AM.  Ronn Melena, [place] Worker [phone]

## 2018-10-11 ENCOUNTER — Other Ambulatory Visit (HOSPITAL_COMMUNITY)
Admission: RE | Admit: 2018-10-11 | Discharge: 2018-10-11 | Disposition: A | Discharge status: Home / Self Care | Payer: Medicare HMO | Source: Ambulatory Visit | Attending: Vascular Surgery | Admitting: Vascular Surgery

## 2018-10-11 ENCOUNTER — Encounter (HOSPITAL_COMMUNITY): Payer: Self-pay | Admitting: *Deleted

## 2018-10-11 ENCOUNTER — Other Ambulatory Visit: Payer: Self-pay

## 2018-10-11 ENCOUNTER — Other Ambulatory Visit: Payer: Self-pay | Admitting: Internal Medicine

## 2018-10-11 DIAGNOSIS — Z01812 Encounter for preprocedural laboratory examination: Secondary | ICD-10-CM | POA: Diagnosis not present

## 2018-10-11 DIAGNOSIS — Z20828 Contact with and (suspected) exposure to other viral communicable diseases: Secondary | ICD-10-CM | POA: Insufficient documentation

## 2018-10-11 DIAGNOSIS — E1151 Type 2 diabetes mellitus with diabetic peripheral angiopathy without gangrene: Secondary | ICD-10-CM

## 2018-10-11 LAB — SARS CORONAVIRUS 2 (TAT 6-24 HRS): SARS Coronavirus 2: NEGATIVE

## 2018-10-11 NOTE — Progress Notes (Signed)
[name] denies chest pain or shortness of breath. Patient was tested for Covid today and reports that she has been in quarantine since that time.  Patient states that she lives alone, I informed her that she will nee someone at home with her for the 1st 24 hours after surgery.  PAtient will be transferred by [facility]  [name] has type II diabetes, she reports that fasting CBGs run 120- 130, and it never goes over 200.  I instructed patient to not take Metformin or Januvia in am. I instructed patient to check CBG after awaking and every 2 hours until arrival  to the hospital.  I Instructed patient if CBG is less than 70 to drink  1/2 cup of a clear juice. Recheck CBG in 15 minutes then call pre- op desk at [phone] for further instructions.

## 2018-10-12 ENCOUNTER — Other Ambulatory Visit: Payer: Self-pay

## 2018-10-12 ENCOUNTER — Encounter (HOSPITAL_COMMUNITY): Payer: Self-pay | Admitting: *Deleted

## 2018-10-12 ENCOUNTER — Observation Stay (HOSPITAL_COMMUNITY)
Admission: RE | Admit: 2018-10-12 | Discharge: 2018-10-13 | Disposition: A | Discharge status: Home / Self Care | Payer: Medicare HMO | Attending: Vascular Surgery | Admitting: Vascular Surgery

## 2018-10-12 ENCOUNTER — Encounter (HOSPITAL_COMMUNITY)
Admission: RE | Disposition: A | Discharge status: Home / Self Care | Payer: Self-pay | Source: Home / Self Care | Attending: Vascular Surgery

## 2018-10-12 ENCOUNTER — Ambulatory Visit (HOSPITAL_COMMUNITY): Payer: Medicare HMO | Admitting: Certified Registered"

## 2018-10-12 DIAGNOSIS — E1139 Type 2 diabetes mellitus with other diabetic ophthalmic complication: Secondary | ICD-10-CM | POA: Diagnosis not present

## 2018-10-12 DIAGNOSIS — Z794 Long term (current) use of insulin: Secondary | ICD-10-CM | POA: Insufficient documentation

## 2018-10-12 DIAGNOSIS — Z880 Allergy status to penicillin: Secondary | ICD-10-CM | POA: Diagnosis not present

## 2018-10-12 DIAGNOSIS — Z95820 Peripheral vascular angioplasty status with implants and grafts: Secondary | ICD-10-CM | POA: Diagnosis not present

## 2018-10-12 DIAGNOSIS — E1151 Type 2 diabetes mellitus with diabetic peripheral angiopathy without gangrene: Secondary | ICD-10-CM | POA: Diagnosis not present

## 2018-10-12 DIAGNOSIS — R519 Headache, unspecified: Secondary | ICD-10-CM | POA: Diagnosis present

## 2018-10-12 DIAGNOSIS — Z7902 Long term (current) use of antithrombotics/antiplatelets: Secondary | ICD-10-CM | POA: Insufficient documentation

## 2018-10-12 DIAGNOSIS — Z888 Allergy status to other drugs, medicaments and biological substances status: Secondary | ICD-10-CM | POA: Diagnosis not present

## 2018-10-12 DIAGNOSIS — Z79890 Hormone replacement therapy (postmenopausal): Secondary | ICD-10-CM | POA: Diagnosis not present

## 2018-10-12 DIAGNOSIS — K219 Gastro-esophageal reflux disease without esophagitis: Secondary | ICD-10-CM | POA: Diagnosis not present

## 2018-10-12 DIAGNOSIS — F17210 Nicotine dependence, cigarettes, uncomplicated: Secondary | ICD-10-CM | POA: Diagnosis not present

## 2018-10-12 DIAGNOSIS — R51 Headache: Secondary | ICD-10-CM | POA: Diagnosis not present

## 2018-10-12 DIAGNOSIS — Z79899 Other long term (current) drug therapy: Secondary | ICD-10-CM | POA: Insufficient documentation

## 2018-10-12 DIAGNOSIS — E039 Hypothyroidism, unspecified: Secondary | ICD-10-CM | POA: Diagnosis not present

## 2018-10-12 DIAGNOSIS — F329 Major depressive disorder, single episode, unspecified: Secondary | ICD-10-CM | POA: Diagnosis not present

## 2018-10-12 DIAGNOSIS — Z8673 Personal history of transient ischemic attack (TIA), and cerebral infarction without residual deficits: Secondary | ICD-10-CM | POA: Insufficient documentation

## 2018-10-12 DIAGNOSIS — I10 Essential (primary) hypertension: Secondary | ICD-10-CM | POA: Insufficient documentation

## 2018-10-12 DIAGNOSIS — F419 Anxiety disorder, unspecified: Secondary | ICD-10-CM | POA: Insufficient documentation

## 2018-10-12 DIAGNOSIS — Z886 Allergy status to analgesic agent status: Secondary | ICD-10-CM | POA: Insufficient documentation

## 2018-10-12 DIAGNOSIS — H42 Glaucoma in diseases classified elsewhere: Secondary | ICD-10-CM | POA: Diagnosis not present

## 2018-10-12 DIAGNOSIS — E785 Hyperlipidemia, unspecified: Secondary | ICD-10-CM | POA: Insufficient documentation

## 2018-10-12 DIAGNOSIS — Z6839 Body mass index (BMI) 39.0-39.9, adult: Secondary | ICD-10-CM | POA: Insufficient documentation

## 2018-10-12 DIAGNOSIS — I739 Peripheral vascular disease, unspecified: Secondary | ICD-10-CM | POA: Diagnosis not present

## 2018-10-12 HISTORY — DX: Carpal tunnel syndrome, unspecified upper limb: G56.00

## 2018-10-12 HISTORY — PX: ARTERY BIOPSY: SHX891

## 2018-10-12 HISTORY — DX: Anemia, unspecified: D64.9

## 2018-10-12 HISTORY — DX: Other complications of anesthesia, initial encounter: T88.59XA

## 2018-10-12 LAB — COMPREHENSIVE METABOLIC PANEL
ALT: 13 U/L (ref 0–44)
AST: 23 U/L (ref 15–41)
Albumin: 3.7 g/dL (ref 3.5–5.0)
Alkaline Phosphatase: 65 U/L (ref 38–126)
Anion gap: 10 (ref 5–15)
BUN: 18 mg/dL (ref 8–23)
CO2: 27 mmol/L (ref 22–32)
Calcium: 9 mg/dL (ref 8.9–10.3)
Chloride: 101 mmol/L (ref 98–111)
Creatinine, Ser: 1.57 mg/dL — ABNORMAL HIGH (ref 0.44–1.00)
GFR calc Af Amer: 39 mL/min — ABNORMAL LOW (ref >60)
GFR calc non Af Amer: 33 mL/min — ABNORMAL LOW (ref >60)
Glucose, Bld: 102 mg/dL — ABNORMAL HIGH (ref 70–99)
Potassium: 4.3 mmol/L (ref 3.5–5.1)
Sodium: 138 mmol/L (ref 135–145)
Total Bilirubin: 1 mg/dL (ref 0.3–1.2)
Total Protein: 6.2 g/dL — ABNORMAL LOW (ref 6.5–8.1)

## 2018-10-12 LAB — CBC
HCT: 35.5 % — ABNORMAL LOW (ref 36.0–46.0)
HCT: 36.6 % (ref 36.0–46.0)
Hemoglobin: 10.7 g/dL — ABNORMAL LOW (ref 12.0–15.0)
Hemoglobin: 11 g/dL — ABNORMAL LOW (ref 12.0–15.0)
MCH: 25.7 pg — ABNORMAL LOW (ref 26.0–34.0)
MCH: 25.2 pg — ABNORMAL LOW (ref 26.0–34.0)
MCHC: 30.1 g/dL (ref 30.0–36.0)
MCHC: 30.1 g/dL (ref 30.0–36.0)
MCV: 85.1 fL (ref 80.0–100.0)
MCV: 83.9 fL (ref 80.0–100.0)
Platelets: 192 10*3/uL (ref 150–400)
Platelets: 162 10*3/uL (ref 150–400)
RBC: 4.17 MIL/uL (ref 3.87–5.11)
RBC: 4.36 MIL/uL (ref 3.87–5.11)
RDW: 17.6 % — ABNORMAL HIGH (ref 11.5–15.5)
RDW: 17.4 % — ABNORMAL HIGH (ref 11.5–15.5)
WBC: 6.6 10*3/uL (ref 4.0–10.5)
WBC: 6.2 10*3/uL (ref 4.0–10.5)
nRBC: 0 % (ref 0.0–0.2)
nRBC: 0 % (ref 0.0–0.2)

## 2018-10-12 LAB — GLUCOSE, CAPILLARY
Glucose-Capillary: 105 mg/dL — ABNORMAL HIGH (ref 70–99)
Glucose-Capillary: 92 mg/dL (ref 70–99)
Glucose-Capillary: 84 mg/dL (ref 70–99)
Glucose-Capillary: 76 mg/dL (ref 70–99)
Glucose-Capillary: 107 mg/dL — ABNORMAL HIGH (ref 70–99)

## 2018-10-12 LAB — CREATININE, SERUM
Creatinine, Ser: 1.39 mg/dL — ABNORMAL HIGH (ref 0.44–1.00)
GFR calc Af Amer: 45 mL/min — ABNORMAL LOW (ref >60)
GFR calc non Af Amer: 39 mL/min — ABNORMAL LOW (ref >60)

## 2018-10-12 LAB — HIV ANTIBODY (ROUTINE TESTING W REFLEX): HIV Screen 4th Generation wRfx: NONREACTIVE

## 2018-10-12 SURGERY — BIOPSY TEMPORAL ARTERY
Anesthesia: Monitor Anesthesia Care | Laterality: Left

## 2018-10-12 MED ORDER — PANTOPRAZOLE SODIUM 40 MG PO TBEC
40.0000 mg | DELAYED_RELEASE_TABLET | Freq: Two times a day (BID) | ORAL | Status: DC
  Administered 2018-10-12 – 2018-10-13 (×2): 40 mg via ORAL
  Filled 2018-10-12 (×2): qty 1

## 2018-10-12 MED ORDER — FENTANYL CITRATE (PF) 100 MCG/2ML IJ SOLN: 25.0000 ug | INTRAMUSCULAR | Status: DC | PRN

## 2018-10-12 MED ORDER — TRAZODONE HCL 50 MG PO TABS
50.0000 mg | ORAL_TABLET | Freq: Every day | ORAL | Status: DC
  Administered 2018-10-12: 50 mg via ORAL
  Filled 2018-10-12: qty 1

## 2018-10-12 MED ORDER — DEXAMETHASONE SODIUM PHOSPHATE 10 MG/ML IJ SOLN
INTRAMUSCULAR | Status: AC
  Filled 2018-10-12: qty 1

## 2018-10-12 MED ORDER — PHENYLEPHRINE 40 MCG/ML (10ML) SYRINGE FOR IV PUSH (FOR BLOOD PRESSURE SUPPORT)
PREFILLED_SYRINGE | INTRAVENOUS | Status: AC
  Filled 2018-10-12: qty 10

## 2018-10-12 MED ORDER — PROPOFOL 500 MG/50ML IV EMUL
INTRAVENOUS | Status: DC | PRN
  Administered 2018-10-12: 25 ug/kg/min via INTRAVENOUS

## 2018-10-12 MED ORDER — ACETAMINOPHEN 325 MG RE SUPP: 325.0000 mg | RECTAL | Status: DC | PRN

## 2018-10-12 MED ORDER — PHENOL 1.4 % MT LIQD: 1.0000 | OROMUCOSAL | Status: DC | PRN

## 2018-10-12 MED ORDER — HEPARIN SODIUM (PORCINE) 5000 UNIT/ML IJ SOLN
5000.0000 [IU] | Freq: Three times a day (TID) | INTRAMUSCULAR | Status: DC
  Filled 2018-10-12: qty 1

## 2018-10-12 MED ORDER — MIDAZOLAM HCL 2 MG/2ML IJ SOLN
INTRAMUSCULAR | Status: AC
  Filled 2018-10-12: qty 2

## 2018-10-12 MED ORDER — ATORVASTATIN CALCIUM 40 MG PO TABS
40.0000 mg | ORAL_TABLET | Freq: Every day | ORAL | Status: DC
  Administered 2018-10-12: 40 mg via ORAL
  Filled 2018-10-12: qty 1

## 2018-10-12 MED ORDER — POTASSIUM 99 MG PO TABS: 99.0000 mg | ORAL_TABLET | Freq: Every day | ORAL | Status: DC

## 2018-10-12 MED ORDER — ACETAMINOPHEN 10 MG/ML IV SOLN: 1000.0000 mg | Freq: Once | INTRAVENOUS | Status: DC | PRN

## 2018-10-12 MED ORDER — FENTANYL CITRATE (PF) 250 MCG/5ML IJ SOLN
INTRAMUSCULAR | Status: AC
  Filled 2018-10-12: qty 5

## 2018-10-12 MED ORDER — ONDANSETRON HCL 4 MG/2ML IJ SOLN
INTRAMUSCULAR | Status: AC
  Filled 2018-10-12: qty 2

## 2018-10-12 MED ORDER — GABAPENTIN 400 MG PO CAPS
800.0000 mg | ORAL_CAPSULE | Freq: Every day | ORAL | Status: DC
  Administered 2018-10-12: 800 mg via ORAL
  Filled 2018-10-12: qty 2

## 2018-10-12 MED ORDER — NEOSTIGMINE METHYLSULFATE 3 MG/3ML IV SOSY
PREFILLED_SYRINGE | INTRAVENOUS | Status: AC
  Filled 2018-10-12: qty 3

## 2018-10-12 MED ORDER — ACETAMINOPHEN 500 MG PO TABS: 1000.0000 mg | ORAL_TABLET | Freq: Once | ORAL | Status: DC | PRN

## 2018-10-12 MED ORDER — OXYCODONE HCL 5 MG PO TABS: 5.0000 mg | ORAL_TABLET | Freq: Once | ORAL | Status: DC | PRN

## 2018-10-12 MED ORDER — LEVOTHYROXINE SODIUM 112 MCG PO TABS
112.0000 ug | ORAL_TABLET | Freq: Every day | ORAL | Status: DC
  Administered 2018-10-13: 112 ug via ORAL
  Filled 2018-10-12: qty 1

## 2018-10-12 MED ORDER — SODIUM CHLORIDE 0.9 % IV SOLN: INTRAVENOUS | Status: DC

## 2018-10-12 MED ORDER — ONDANSETRON HCL 4 MG/2ML IJ SOLN
INTRAMUSCULAR | Status: DC | PRN
  Administered 2018-10-12: 4 mg via INTRAVENOUS

## 2018-10-12 MED ORDER — FENTANYL CITRATE (PF) 100 MCG/2ML IJ SOLN
INTRAMUSCULAR | Status: DC | PRN
  Administered 2018-10-12: 50 ug via INTRAVENOUS
  Administered 2018-10-12: 25 ug via INTRAVENOUS

## 2018-10-12 MED ORDER — OXYCODONE HCL 5 MG/5ML PO SOLN: 5.0000 mg | Freq: Once | ORAL | Status: DC | PRN

## 2018-10-12 MED ORDER — ROCURONIUM BROMIDE 10 MG/ML (PF) SYRINGE
PREFILLED_SYRINGE | INTRAVENOUS | Status: AC
  Filled 2018-10-12: qty 10

## 2018-10-12 MED ORDER — DULOXETINE HCL 20 MG PO CPEP
20.0000 mg | ORAL_CAPSULE | Freq: Two times a day (BID) | ORAL | Status: DC
  Administered 2018-10-12 – 2018-10-13 (×2): 20 mg via ORAL
  Filled 2018-10-12 (×2): qty 1

## 2018-10-12 MED ORDER — LISINOPRIL-HYDROCHLOROTHIAZIDE 20-25 MG PO TABS: 1.0000 | ORAL_TABLET | Freq: Every day | ORAL | Status: DC

## 2018-10-12 MED ORDER — LIDOCAINE 2% (20 MG/ML) 5 ML SYRINGE
INTRAMUSCULAR | Status: AC
  Filled 2018-10-12: qty 5

## 2018-10-12 MED ORDER — LINAGLIPTIN 5 MG PO TABS
5.0000 mg | ORAL_TABLET | Freq: Every day | ORAL | Status: DC
  Administered 2018-10-13: 5 mg via ORAL
  Filled 2018-10-12: qty 1

## 2018-10-12 MED ORDER — CLOPIDOGREL BISULFATE 75 MG PO TABS
75.0000 mg | ORAL_TABLET | Freq: Every day | ORAL | Status: DC
  Administered 2018-10-12: 75 mg via ORAL
  Filled 2018-10-12: qty 1

## 2018-10-12 MED ORDER — 0.9 % SODIUM CHLORIDE (POUR BTL) OPTIME
TOPICAL | Status: DC | PRN
  Administered 2018-10-12: 1000 mL

## 2018-10-12 MED ORDER — SODIUM CHLORIDE 0.9 % IV SOLN: 250.0000 mL | INTRAVENOUS | Status: DC | PRN

## 2018-10-12 MED ORDER — CLINDAMYCIN PHOSPHATE 900 MG/50ML IV SOLN
900.0000 mg | Freq: Once | INTRAVENOUS | Status: AC
  Administered 2018-10-12: 900 mg via INTRAVENOUS
  Filled 2018-10-12: qty 50

## 2018-10-12 MED ORDER — GUAIFENESIN-DM 100-10 MG/5ML PO SYRP: 15.0000 mL | ORAL_SOLUTION | ORAL | Status: DC | PRN

## 2018-10-12 MED ORDER — LACTATED RINGERS IV SOLN
INTRAVENOUS | Status: DC
  Administered 2018-10-12: 10:00:00 via INTRAVENOUS

## 2018-10-12 MED ORDER — PHENYLEPHRINE 40 MCG/ML (10ML) SYRINGE FOR IV PUSH (FOR BLOOD PRESSURE SUPPORT)
PREFILLED_SYRINGE | INTRAVENOUS | Status: DC | PRN
  Administered 2018-10-12 (×3): 80 ug via INTRAVENOUS

## 2018-10-12 MED ORDER — GLYCOPYRROLATE PF 0.2 MG/ML IJ SOSY
PREFILLED_SYRINGE | INTRAMUSCULAR | Status: AC
  Filled 2018-10-12: qty 2

## 2018-10-12 MED ORDER — ACETAMINOPHEN 500 MG PO TABS
1000.0000 mg | ORAL_TABLET | Freq: Three times a day (TID) | ORAL | Status: DC | PRN
  Administered 2018-10-12: 1000 mg via ORAL
  Filled 2018-10-12: qty 2

## 2018-10-12 MED ORDER — AMLODIPINE BESYLATE 10 MG PO TABS
10.0000 mg | ORAL_TABLET | Freq: Every day | ORAL | Status: DC
  Administered 2018-10-12: 10 mg via ORAL
  Filled 2018-10-12: qty 1

## 2018-10-12 MED ORDER — SUCCINYLCHOLINE CHLORIDE 200 MG/10ML IV SOSY
PREFILLED_SYRINGE | INTRAVENOUS | Status: AC
  Filled 2018-10-12: qty 10

## 2018-10-12 MED ORDER — MIDAZOLAM HCL 5 MG/5ML IJ SOLN
INTRAMUSCULAR | Status: DC | PRN
  Administered 2018-10-12 (×2): 1 mg via INTRAVENOUS

## 2018-10-12 MED ORDER — VITAMIN B-12 1000 MCG PO TABS
1000.0000 ug | ORAL_TABLET | Freq: Every day | ORAL | Status: DC
  Filled 2018-10-12 (×2): qty 1

## 2018-10-12 MED ORDER — LISINOPRIL 10 MG PO TABS
20.0000 mg | ORAL_TABLET | Freq: Every day | ORAL | Status: DC
  Administered 2018-10-13: 20 mg via ORAL
  Filled 2018-10-12: qty 2

## 2018-10-12 MED ORDER — GABAPENTIN 400 MG PO CAPS
400.0000 mg | ORAL_CAPSULE | Freq: Two times a day (BID) | ORAL | Status: DC
  Administered 2018-10-13: 400 mg via ORAL
  Filled 2018-10-12: qty 1

## 2018-10-12 MED ORDER — LIDOCAINE HCL (PF) 1 % IJ SOLN
INTRAMUSCULAR | Status: AC
  Filled 2018-10-12: qty 30

## 2018-10-12 MED ORDER — METFORMIN HCL ER 500 MG PO TB24
500.0000 mg | ORAL_TABLET | Freq: Two times a day (BID) | ORAL | Status: DC
  Administered 2018-10-13: 500 mg via ORAL
  Filled 2018-10-12: qty 1

## 2018-10-12 MED ORDER — INSULIN GLARGINE 100 UNIT/ML ~~LOC~~ SOLN
1.0000 [IU] | Freq: Every day | SUBCUTANEOUS | Status: DC | PRN
  Filled 2018-10-12: qty 0.01

## 2018-10-12 MED ORDER — SODIUM CHLORIDE 0.9% FLUSH
3.0000 mL | Freq: Two times a day (BID) | INTRAVENOUS | Status: DC
  Administered 2018-10-12 – 2018-10-13 (×2): 3 mL via INTRAVENOUS

## 2018-10-12 MED ORDER — GABAPENTIN 400 MG PO CAPS: 400.0000 mg | ORAL_CAPSULE | Freq: Three times a day (TID) | ORAL | Status: DC

## 2018-10-12 MED ORDER — ONDANSETRON HCL 4 MG/2ML IJ SOLN: 4.0000 mg | Freq: Four times a day (QID) | INTRAMUSCULAR | Status: DC | PRN

## 2018-10-12 MED ORDER — SODIUM CHLORIDE 0.9% FLUSH: 3.0000 mL | INTRAVENOUS | Status: DC | PRN

## 2018-10-12 MED ORDER — METOPROLOL TARTRATE 5 MG/5ML IV SOLN: 2.0000 mg | INTRAVENOUS | Status: DC | PRN

## 2018-10-12 MED ORDER — LABETALOL HCL 5 MG/ML IV SOLN: 10.0000 mg | INTRAVENOUS | Status: DC | PRN

## 2018-10-12 MED ORDER — ACETAMINOPHEN 160 MG/5ML PO SOLN: 1000.0000 mg | Freq: Once | ORAL | Status: DC | PRN

## 2018-10-12 MED ORDER — HYDRALAZINE HCL 20 MG/ML IJ SOLN: 5.0000 mg | INTRAMUSCULAR | Status: DC | PRN

## 2018-10-12 MED ORDER — PREDNISONE 20 MG PO TABS
60.0000 mg | ORAL_TABLET | Freq: Every day | ORAL | Status: DC
  Filled 2018-10-12: qty 3

## 2018-10-12 MED ORDER — ALUM & MAG HYDROXIDE-SIMETH 200-200-20 MG/5ML PO SUSP: 15.0000 mL | ORAL | Status: DC | PRN

## 2018-10-12 MED ORDER — PANTOPRAZOLE SODIUM 40 MG PO TBEC: 40.0000 mg | DELAYED_RELEASE_TABLET | Freq: Every day | ORAL | Status: DC

## 2018-10-12 MED ORDER — POTASSIUM CHLORIDE CRYS ER 20 MEQ PO TBCR: 20.0000 meq | EXTENDED_RELEASE_TABLET | Freq: Once | ORAL | Status: DC

## 2018-10-12 MED ORDER — INSULIN ASPART 100 UNIT/ML ~~LOC~~ SOLN: 0.0000 [IU] | Freq: Three times a day (TID) | SUBCUTANEOUS | Status: DC

## 2018-10-12 MED ORDER — ACETAMINOPHEN 325 MG PO TABS
325.0000 mg | ORAL_TABLET | ORAL | Status: DC | PRN
  Administered 2018-10-13: 650 mg via ORAL
  Filled 2018-10-12: qty 2

## 2018-10-12 MED ORDER — SENNOSIDES-DOCUSATE SODIUM 8.6-50 MG PO TABS: 1.0000 | ORAL_TABLET | Freq: Every evening | ORAL | Status: DC | PRN

## 2018-10-12 MED ORDER — FUROSEMIDE 20 MG PO TABS
20.0000 mg | ORAL_TABLET | Freq: Every day | ORAL | Status: DC
  Filled 2018-10-12: qty 1

## 2018-10-12 MED ORDER — DOCUSATE SODIUM 100 MG PO CAPS
100.0000 mg | ORAL_CAPSULE | Freq: Two times a day (BID) | ORAL | Status: DC
  Administered 2018-10-12: 100 mg via ORAL
  Filled 2018-10-12 (×2): qty 1

## 2018-10-12 MED ORDER — TRAMADOL HCL 50 MG PO TABS: 50.0000 mg | ORAL_TABLET | Freq: Four times a day (QID) | ORAL | Status: DC | PRN

## 2018-10-12 MED ORDER — HYDROCHLOROTHIAZIDE 25 MG PO TABS
25.0000 mg | ORAL_TABLET | Freq: Every day | ORAL | Status: DC
  Filled 2018-10-12: qty 1

## 2018-10-12 SURGICAL SUPPLY — 38 items
CANISTER SUCT 3000ML PPV (MISCELLANEOUS) ×3 IMPLANT
CLIP VESOCCLUDE SM WIDE 6/CT (CLIP) ×6 IMPLANT
CONT SPEC 4OZ CLIKSEAL STRL BL (MISCELLANEOUS) ×3 IMPLANT
COTTONBALL LRG STERILE PKG (GAUZE/BANDAGES/DRESSINGS) ×3 IMPLANT
COVER SURGICAL LIGHT HANDLE (MISCELLANEOUS) ×3 IMPLANT
COVER WAND RF STERILE (DRAPES) ×3 IMPLANT
DECANTER SPIKE VIAL GLASS SM (MISCELLANEOUS) ×3 IMPLANT
DERMABOND ADVANCED (GAUZE/BANDAGES/DRESSINGS) ×2
DERMABOND ADVANCED .7 DNX12 (GAUZE/BANDAGES/DRESSINGS) ×1 IMPLANT
DRAPE OPHTHALMIC 77X100 STRL (CUSTOM PROCEDURE TRAY) ×3 IMPLANT
ELECT NEEDLE BLADE 2-5/6 (NEEDLE) ×3 IMPLANT
ELECT REM PT RETURN 9FT ADLT (ELECTROSURGICAL) ×3
ELECTRODE REM PT RTRN 9FT ADLT (ELECTROSURGICAL) ×1 IMPLANT
GAUZE 4X4 16PLY RFD (DISPOSABLE) ×3 IMPLANT
GEL ULTRASOUND 8.5O AQUASONIC (MISCELLANEOUS) ×3 IMPLANT
GLOVE BIO SURGEON STRL SZ7.5 (GLOVE) ×3 IMPLANT
GOWN STRL REUS W/ TWL LRG LVL3 (GOWN DISPOSABLE) ×1 IMPLANT
GOWN STRL REUS W/ TWL XL LVL3 (GOWN DISPOSABLE) ×1 IMPLANT
GOWN STRL REUS W/TWL LRG LVL3 (GOWN DISPOSABLE) ×2
GOWN STRL REUS W/TWL XL LVL3 (GOWN DISPOSABLE) ×2
KIT BASIN OR (CUSTOM PROCEDURE TRAY) ×3 IMPLANT
KIT TURNOVER KIT B (KITS) ×3 IMPLANT
LOOP VESSEL MINI RED (MISCELLANEOUS) ×3 IMPLANT
NEEDLE HYPO 25GX1X1/2 BEV (NEEDLE) ×3 IMPLANT
NS IRRIG 1000ML POUR BTL (IV SOLUTION) ×3 IMPLANT
PACK GENERAL/GYN (CUSTOM PROCEDURE TRAY) ×3 IMPLANT
PAD ARMBOARD 7.5X6 YLW CONV (MISCELLANEOUS) ×6 IMPLANT
SUCTION FRAZIER HANDLE 10FR (MISCELLANEOUS) ×2
SUCTION TUBE FRAZIER 10FR DISP (MISCELLANEOUS) ×1 IMPLANT
SUT MNCRL AB 4-0 PS2 18 (SUTURE) ×3 IMPLANT
SUT PROLENE 6 0 BV (SUTURE) ×3 IMPLANT
SUT SILK 3 0 (SUTURE) ×2
SUT SILK 3-0 18XBRD TIE 12 (SUTURE) ×1 IMPLANT
SUT VIC AB 3-0 SH 27 (SUTURE) ×2
SUT VIC AB 3-0 SH 27X BRD (SUTURE) ×1 IMPLANT
SYR CONTROL 10ML LL (SYRINGE) ×3 IMPLANT
TOWEL GREEN STERILE (TOWEL DISPOSABLE) ×3 IMPLANT
WATER STERILE IRR 1000ML POUR (IV SOLUTION) ×3 IMPLANT

## 2018-10-12 NOTE — Op Note (Signed)
    Patient name: GHADEER KASTELIC MRN: [id number] DOB: [birth year] Sex: female  10/12/2018 Pre-operative Diagnosis: Possible temporal arteritis Post-operative diagnosis:  Same Surgeon:  Eda Paschal. Donzetta Matters, MD Procedure Performed:  Left temporal artery biopsy  Indications: 68 year old female with concern for temporal arteritis.  She is indicated for temporal artery biopsy.  Findings: Temporal artery appeared externally normal.  5 cm segment was sent for pathology.   Procedure:  The patient was identified in the holding area and taken to the operating room where she is placed supine operative MAC anesthesia induced.  She is sterilely prepped and draped in the left side of her face near her ear she was clipped of the hair just in front of the ear timeout was called.  Doppler was used to identify the temporal artery.  Longitudinal incision was made.  We did dissected out the vein first and ligated this between clips and ties.  The artery was then identified.  Distally we clipped and transected.  Branches were taken from clips.  We doubly clipped the proximally.  Prior to this we did confirm arterial flow through the artery.  There was no further Doppler signal in the wound bed.  We irrigated hemostasis closed with 4 Monocryl dermal was placed to level skin.  She was awakened anesthesia having tolerated procedure without immediate complication.  All counts were correct at completion.  EBL: 10 cc   Shuntae Herzig C. Donzetta Matters, MD [facility] Office: [phone] Pager: [phone]

## 2018-10-12 NOTE — Progress Notes (Signed)
Patient arrived to unit and oriented to room. CHG wipes applied. VSS. Call bell within reach. Will continue to monitor.   Arletta Bale, RN

## 2018-10-12 NOTE — Anesthesia Procedure Notes (Signed)
Procedure Name: MAC Date/Time: 10/12/2018 1:06 PM Performed by: Imagene Riches, CRNA Pre-anesthesia Checklist: Patient identified, Emergency Drugs available, Suction available, Patient being monitored and Timeout performed Patient Re-evaluated:Patient Re-evaluated prior to induction Oxygen Delivery Method: Simple face mask

## 2018-10-12 NOTE — Transfer of Care (Signed)
Immediate Anesthesia Transfer of Care Note  Patient: [name]  Procedure(s) Performed: LEFT TEMPORAL ARTERY BIOPSY (Left )  Patient Location: PACU  Anesthesia Type:MAC  Level of Consciousness: awake, alert  and oriented  Airway & Oxygen Therapy: Patient Spontanous Breathing and Patient connected to face mask oxygen  Post-op Assessment: Report given to RN and Post -op Vital signs reviewed and stable  Post vital signs: Reviewed and stable  Last Vitals:  Vitals Value Taken Time  BP 111/64 10/12/18 1345  Temp    Pulse 70 10/12/18 1347  Resp 15 10/12/18 1347  SpO2 96 % 10/12/18 1347  Vitals shown include unvalidated device data.  Last Pain:  Vitals:   10/12/18 1013  TempSrc:   PainSc: 2       Patients Stated Pain Goal: 1 (35/57/32 2025)  Complications: No apparent anesthesia complications

## 2018-10-12 NOTE — H&P (Signed)
   History and Physical Update  The patient was interviewed and re-examined.  The patient's previous History and Physical has been reviewed and is unchanged from recent office visit. Plan for left temporal artery biopsy.  Dionis Autry C. Donzetta Matters, MD [facility] [place] Office: [phone] Pager: [phone]   10/12/2018, 11:54 AM

## 2018-10-12 NOTE — Anesthesia Preprocedure Evaluation (Signed)
Anesthesia Evaluation  Patient identified by MRN, date of birth, ID band Patient awake    Reviewed: Allergy & Precautions, NPO status , Patient's Chart, lab work & pertinent test results  History of Anesthesia Complications (+) history of anesthetic complications  Airway Mallampati: II  TM Distance: >3 FB Neck ROM: Full    Dental  (+) Dental Advisory Given   Pulmonary neg shortness of breath, neg sleep apnea, neg recent URI, Current Smoker and Patient abstained from smoking.,    breath sounds clear to auscultation       Cardiovascular hypertension, + Peripheral Vascular Disease   Rhythm:Regular     Neuro/Psych  Headaches, PSYCHIATRIC DISORDERS Anxiety Depression TIA Neuromuscular disease    GI/Hepatic   Endo/Other  diabetesHypothyroidism Morbid obesity  Renal/GU Renal InsufficiencyRenal disease     Musculoskeletal   Abdominal   Peds  Hematology  (+) anemia ,   Anesthesia Other Findings   Reproductive/Obstetrics                             Anesthesia Physical Anesthesia Plan  ASA: III  Anesthesia Plan: MAC   Post-op Pain Management:    Induction: Intravenous  PONV Risk Score and Plan: 1 and Treatment may vary due to age or medical condition  Airway Management Planned: Nasal Cannula  Additional Equipment:   Intra-op Plan:   Post-operative Plan:   Informed Consent: I have reviewed the patients History and Physical, chart, labs and discussed the procedure including the risks, benefits and alternatives for the proposed anesthesia with the patient or authorized representative who has indicated his/her understanding and acceptance.     Dental advisory given  Plan Discussed with: CRNA and Surgeon  Anesthesia Plan Comments:         Anesthesia Quick Evaluation

## 2018-10-13 ENCOUNTER — Encounter (HOSPITAL_COMMUNITY): Payer: Self-pay | Admitting: Vascular Surgery

## 2018-10-13 ENCOUNTER — Other Ambulatory Visit: Payer: Self-pay

## 2018-10-13 DIAGNOSIS — R51 Headache: Secondary | ICD-10-CM | POA: Diagnosis not present

## 2018-10-13 DIAGNOSIS — F419 Anxiety disorder, unspecified: Secondary | ICD-10-CM | POA: Diagnosis not present

## 2018-10-13 DIAGNOSIS — H42 Glaucoma in diseases classified elsewhere: Secondary | ICD-10-CM | POA: Diagnosis not present

## 2018-10-13 DIAGNOSIS — K219 Gastro-esophageal reflux disease without esophagitis: Secondary | ICD-10-CM | POA: Diagnosis not present

## 2018-10-13 DIAGNOSIS — E785 Hyperlipidemia, unspecified: Secondary | ICD-10-CM | POA: Diagnosis not present

## 2018-10-13 DIAGNOSIS — E1139 Type 2 diabetes mellitus with other diabetic ophthalmic complication: Secondary | ICD-10-CM | POA: Diagnosis not present

## 2018-10-13 DIAGNOSIS — F329 Major depressive disorder, single episode, unspecified: Secondary | ICD-10-CM | POA: Diagnosis not present

## 2018-10-13 DIAGNOSIS — E1151 Type 2 diabetes mellitus with diabetic peripheral angiopathy without gangrene: Secondary | ICD-10-CM | POA: Diagnosis not present

## 2018-10-13 DIAGNOSIS — I10 Essential (primary) hypertension: Secondary | ICD-10-CM | POA: Diagnosis not present

## 2018-10-13 LAB — GLUCOSE, CAPILLARY
Glucose-Capillary: 132 mg/dL — ABNORMAL HIGH (ref 70–99)
Glucose-Capillary: 208 mg/dL — ABNORMAL HIGH (ref 70–99)

## 2018-10-13 LAB — SURGICAL PATHOLOGY

## 2018-10-13 NOTE — Discharge Instructions (Signed)
Incision Care, Adult °An incision is a cut that a doctor makes in your skin for surgery (for a procedure). Most times, these cuts are closed after surgery. Your cut from surgery may be closed with stitches (sutures), staples, skin glue, or skin tape (adhesive strips). You may need to return to your doctor to have stitches or staples taken out. This may happen many days or many weeks after your surgery. The cut needs to be well cared for so it does not get infected. °How to care for your cut °Cut care ° °· Follow instructions from your doctor about how to take care of your cut. Make sure you: °? Wash your hands with soap and water before you change your bandage (dressing). If you cannot use soap and water, use hand sanitizer. °? Change your bandage as told by your doctor. °? Leave stitches, skin glue, or skin tape in place. They may need to stay in place for 2 weeks or longer. If tape strips get loose and curl up, you may trim the loose edges. Do not remove tape strips completely unless your doctor says it is okay. °· Check your cut area every day for signs of infection. Check for: °? More redness, swelling, or pain. °? More fluid or blood. °? Warmth. °? Pus or a bad smell. °· Ask your doctor how to clean the cut. This may include: °? Using mild soap and water. °? Using a clean towel to pat the cut dry after you clean it. °? Putting a cream or ointment on the cut. Do this only as told by your doctor. °? Covering the cut with a clean bandage. °· Ask your doctor when you can leave the cut uncovered. °· Do not take baths, swim, or use a hot tub until your doctor says it is okay. Ask your doctor if you can take showers. You may only be allowed to take sponge baths for bathing. °Medicines °· If you were prescribed an antibiotic medicine, cream, or ointment, take the antibiotic or put it on the cut as told by your doctor. Do not stop taking or putting on the antibiotic even if your condition gets better. °· Take  over-the-counter and prescription medicines only as told by your doctor. °General instructions °· Limit movement around your cut. This helps healing. °? Avoid straining, lifting, or exercise for the first month, or for as long as told by your doctor. °? Follow instructions from your doctor about going back to your normal activities. °? Ask your doctor what activities are safe. °· Protect your cut from the sun when you are outside for the first 6 months, or for as long as told by your doctor. Put on sunscreen around the scar or cover up the scar. °· Keep all follow-up visits as told by your doctor. This is important. °Contact a doctor if: °· Your have more redness, swelling, or pain around the cut. °· You have more fluid or blood coming from the cut. °· Your cut feels warm to the touch. °· You have pus or a bad smell coming from the cut. °· You have a fever or shaking chills. °· You feel sick to your stomach (nauseous) or you throw up (vomit). °· You are dizzy. °· Your stitches or staples come undone. °Get help right away if: °· You have a red streak coming from your cut. °· Your cut bleeds through the bandage and the bleeding does not stop with gentle pressure. °· The edges of your cut   open up and separate. °· You have very bad (severe) pain. °· You have a rash. °· You are confused. °· You pass out (faint). °· You have trouble breathing and you have a fast heartbeat. °This information is not intended to replace advice given to you by your health care provider. Make sure you discuss any questions you have with your health care provider. °Document Released: 03/24/2011 Document Revised: 05/19/2016 Document Reviewed: 09/07/2015 °Elsevier Patient Education © 2020 [employer] ° °

## 2018-10-13 NOTE — Discharge Summary (Signed)
[name] Discharge Summary   Patient ID: [name] [id number] 68 y.o. 04/26/1950  Admit date: 10/12/2018  Discharge date and time:  10/13/18  Admitting Physician: Waynetta Sandy, MD   Discharge Physician: same  Admission Diagnoses: headaches  Discharge Diagnoses: same  Admission Condition: fair  Discharged Condition: poor  Indication for Admission: possible temporal arteritis  Hospital Course: [name] is a 68y.o female with suspected temporal arteritis.  She was scheduled as an outpatient for left temporal artery biopsy.  She tolerated the procedure well and was kept overnight for observation.  POD#1 she is ready for discharge home.  She is also followed by [facility] for PAD with history of R SFA stent.  She will follow up in office in 2 months with RLE arterial duplex and ABIs.  She will be discharged home in stable condition.  Consults: None  Treatments: surgery: left temporal artery biopsy by Dr. Donzetta Matters 10/12/18  Discharge Exam: minimal soreness at incision Vitals:   10/13/18 0432 10/13/18 0602  BP: 113/65   Pulse: 74 77  Resp: 17 (!) 23  Temp: 98.1 F (36.7 C)   SpO2: 96% 100%   Lungs:  Non labored Incisions:  C/d/i L temporal Extremities:  Moving all extremities well Neurologic: A&O   Disposition: Discharge disposition: 01-Home or Self Care       Patient Instructions:  Allergies as of 10/13/2018      Reactions   Gadolinium Derivatives Anaphylaxis, Other (See Comments)   Chest tightness   Asa [aspirin] Hives, Nausea And Vomiting   Crestor [rosuvastatin Calcium] Hives, Itching   Penicillins Hives, Itching   Did it involve swelling of the face/tongue/throat, SOB, or low BP? No Did it involve sudden or severe rash/hives, skin peeling, or any reaction on the inside of your mouth or nose? Yes Did you need to seek medical attention at a hospital or doctor's office? No When did it last happen?2 years If all above answers are "NO", may  proceed with cephalosporin use.   Chantix [varenicline Tartrate] Other (See Comments)   "gave me nightmares"   Iodine Rash   On skin       Medication List    TAKE these medications   Accu-Chek Aviva Plus w/Device Kit Use to check blood sugar up to 3 times a day   Accu-Chek Aviva Soln   Accu-Chek Softclix Lancets lancets CHECK BLOOD SUGAR UP TO 4 TIMES DAILY   acetaminophen 500 MG tablet Commonly known as: TYLENOL Take 2 tablets (1,000 mg total) by mouth every 8 (eight) hours as needed for moderate pain.   amLODipine 10 MG tablet Commonly known as: NORVASC TAKE 1 TABLET EVERY DAY What changed: when to take this   atorvastatin 40 MG tablet Commonly known as: LIPITOR TAKE 1 TABLET EVERY DAY What changed: when to take this   B-D SINGLE USE SWABS REGULAR Pads USE AS NEEDED AS DIRECTED   cholecalciferol 25 MCG (1000 UT) tablet Commonly known as: VITAMIN D3 Take 1,000 Units by mouth daily.   clopidogrel 75 MG tablet Commonly known as: PLAVIX TAKE 1 TABLET DAILY FOR PERIPHERAL ARTERIAL DISEASE What changed: See the new instructions.   DULoxetine 20 MG capsule Commonly known as: CYMBALTA Take 2 capsules (40 mg total) by mouth daily. What changed:   how much to take  when to take this   ferrous sulfate 325 (65 FE) MG EC tablet TAKE 1 TABLET EVERY OTHER DAY   fluconazole 100 MG tablet Commonly known as: Diflucan Take two  tablets the first day, and then one tablet every day after until they are gone   furosemide 20 MG tablet Commonly known as: LASIX TAKE 1 TABLET EVERY DAY   gabapentin 400 MG capsule Commonly known as: NEURONTIN TAKE 1 CAPSULE EVERY MORNING, 1 CAPSULE AT LUNCH, AND 2 CAPSULES AT BEDTIME What changed:   how much to take  how to take this  when to take this  additional instructions   glucose blood test strip Commonly known as: Accu-Chek Aviva Plus Use to check blood sugar 3 times daily. 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   hydrocortisone acetate 0.5 % cream Apply topically 2 (two) times daily. What changed:   how much to take  when to take this  reasons to take this   Lantus 100 UNIT/ML injection Generic drug: insulin glargine Inject 1 Units into the skin daily as needed (if blood sugar goes over 300).   levothyroxine 112 MCG tablet Commonly known as: SYNTHROID Take 1 tablet daily by mouth 1 hour  before breakfast What changed:   how much to take  how to take this  when to take this  additional instructions   lisinopril-hydrochlorothiazide 20-25 MG tablet Commonly known as: ZESTORETIC TAKE 1 TABLET EVERY DAY   Melatonin 10 MG Caps Take 10 mg by mouth at bedtime.   metFORMIN 500 MG 24 hr tablet Commonly known as: GLUCOPHAGE-XR Take 1 tablet (500 mg total) by mouth 2 (two) times daily. Before lunch and supper   pantoprazole 40 MG tablet Commonly known as: PROTONIX TAKE 1 TABLET TWICE DAILY   Potassium 99 MG Tabs Take 99 mg by mouth daily.   predniSONE 20 MG tablet Commonly known as: DELTASONE Take 3 tablets (60 mg total) by mouth daily with breakfast. What changed: how much to take   sitaGLIPtin 100 MG tablet Commonly known as: Januvia TAKE 1 TABLET DAILY   SYSTANE OP Place 1 drop into both eyes daily as needed (dry eyes).   traZODone 50 MG tablet Commonly known as: DESYREL Take 1 tablet (50 mg total) by mouth at bedtime.   vitamin B-12 1000 MCG tablet Commonly known as: CYANOCOBALAMIN Take 1,000 mcg by mouth daily.      Activity: activity as tolerated Diet: regular diet Wound Care: keep wound clean and dry  Follow-up with Dr. Donzetta Matters in 2 months.  SignedDagoberto Ligas 10/13/2018 7:29 AM

## 2018-10-13 NOTE — Progress Notes (Signed)
Subjective:  Patient ID: [name], female    DOB: [birth year],  MRN: [id number]  No chief complaint on file.   68 y.o. female presents  for follow-up of Morton's neuroma.  States that the area did much better after the injection  Review of Systems: Negative except as noted in the HPI. Denies N/V/F/Ch.  Past Medical History:  Diagnosis Date  . Allergy   . Anal fissure   . Anemia   . Anxiety   . Blurry vision 09/19/2014  . Carpal tunnel syndrome    left  . Cataract   . Chronic headaches   . Complication of anesthesia    "fight when I come out from anesthesia"  . Depression   . Diverticulosis   . Estrogen deficiency 02/05/2018  . GERD (gastroesophageal reflux disease)   . Glaucoma   . HLD (hyperlipidemia)   . Hypertension   . Hypothyroidism   . Infestation by bed bug 07/29/2018   - hydrocodone acetate for itching - provided information on how to treat for bed bugs  - letter provided to help her obtain assistance for moving.   Marland Kitchen PAD (peripheral artery disease) ([place])   . PAD (peripheral artery disease) ([place])   . PUD (peptic ulcer disease)   . TIA (transient ischemic attack)   . Type 2 diabetes mellitus with complication ([place]) 19/14/7829    Current Outpatient Medications:  .  ACCU-CHEK AVIVA PLUS test strip, USE 3 TIMES DAILY TO CHECK BLOOD SUGAR., Disp: 275 each, Rfl: 1 .  Accu-Chek Softclix Lancets lancets, CHECK BLOOD SUGAR UP TO 4 TIMES DAILY, Disp: 400 each, Rfl: 1 .  acetaminophen (TYLENOL) 500 MG tablet, Take 2 tablets (1,000 mg total) by mouth every 8 (eight) hours as needed for moderate pain., Disp: 90 tablet, Rfl: 1 .  Alcohol Swabs (B-D SINGLE USE SWABS REGULAR) PADS, USE AS NEEDED AS DIRECTED, Disp: 200 each, Rfl: 1 .  amLODipine (NORVASC) 10 MG tablet, TAKE 1 TABLET EVERY DAY (Patient taking differently: Take 10 mg by mouth at bedtime. ), Disp: 90 tablet, Rfl: 0 .  atorvastatin (LIPITOR) 40 MG tablet, TAKE 1 TABLET EVERY DAY (Patient taking differently: Take  40 mg by mouth at bedtime. ), Disp: 90 tablet, Rfl: 0 .  Blood Glucose Calibration (ACCU-CHEK AVIVA) SOLN, , Disp: , Rfl:  .  Blood Glucose Monitoring Suppl (ACCU-CHEK AVIVA PLUS) w/Device KIT, Use to check blood sugar up to 3 times a day, Disp: 1 kit, Rfl: 0 .  cholecalciferol (VITAMIN D3) 25 MCG (1000 UT) tablet, Take 1,000 Units by mouth daily., Disp: , Rfl:  .  clopidogrel (PLAVIX) 75 MG tablet, TAKE 1 TABLET DAILY FOR PERIPHERAL ARTERIAL DISEASE (Patient taking differently: Take 75 mg by mouth at bedtime. ), Disp: 90 tablet, Rfl: 0 .  DULoxetine (CYMBALTA) 20 MG capsule, Take 2 capsules (40 mg total) by mouth daily. (Patient taking differently: Take 20 mg by mouth 2 (two) times daily. ), Disp: 60 capsule, Rfl: 0 .  ferrous sulfate 325 (65 FE) MG EC tablet, TAKE 1 TABLET EVERY OTHER DAY (Patient taking differently: Take 325 mg by mouth every other day. ), Disp: 45 tablet, Rfl: 0 .  fluconazole (DIFLUCAN) 100 MG tablet, Take two tablets the first day, and then one tablet every day after until they are gone, Disp: 7 tablet, Rfl: 0 .  furosemide (LASIX) 20 MG tablet, TAKE 1 TABLET EVERY DAY (Patient taking differently: Take 20 mg by mouth daily. ), Disp: 90 tablet, Rfl: 1 .  gabapentin (NEURONTIN) 400 MG capsule, TAKE 1 CAPSULE EVERY MORNING, 1 CAPSULE AT LUNCH, AND 2 CAPSULES AT BEDTIME (Patient taking differently: Take 400-800 mg by mouth See admin instructions. Take 400 mg in the morning, 400 mg at lunch, and 800 mg at bedtime), Disp: 90 capsule, Rfl: 0 .  glucose blood (ACCU-CHEK AVIVA PLUS) test strip, Use to check blood sugar 3 times daily. 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  hydrocortisone acetate 0.5 % cream, Apply topically 2 (two) times daily. (Patient taking differently: Apply 1 application topically 2 (two) times daily as needed (itching). ), Disp: 30 g, Rfl: 0 .  LANTUS 100 UNIT/ML injection, Inject 1 Units into the skin daily as needed (if blood sugar goes over  300). , Disp: , Rfl:  .  levothyroxine (SYNTHROID, LEVOTHROID) 112 MCG tablet, Take 1 tablet daily by mouth 1 hour  before breakfast (Patient taking differently: Take 112 mcg by mouth daily before breakfast. ), Disp: 90 tablet, Rfl: 1 .  lisinopril-hydrochlorothiazide (ZESTORETIC) 20-25 MG tablet, TAKE 1 TABLET EVERY DAY (Patient taking differently: Take 1 tablet by mouth daily. ), Disp: 90 tablet, Rfl: 1 .  Melatonin 10 MG CAPS, Take 10 mg by mouth at bedtime., Disp: , Rfl:  .  metFORMIN (GLUCOPHAGE-XR) 500 MG 24 hr tablet, Take 1 tablet (500 mg total) by mouth 2 (two) times daily. Before lunch and supper, Disp: 180 tablet, Rfl: 1 .  pantoprazole (PROTONIX) 40 MG tablet, TAKE 1 TABLET TWICE DAILY (Patient taking differently: Take 40 mg by mouth 2 (two) times daily. ), Disp: 180 tablet, Rfl: 0 .  Polyethyl Glycol-Propyl Glycol (SYSTANE OP), Place 1 drop into both eyes daily as needed (dry eyes). , Disp: , Rfl:  .  Potassium 99 MG TABS, Take 99 mg by mouth daily., Disp: , Rfl:  .  predniSONE (DELTASONE) 20 MG tablet, Take 3 tablets (60 mg total) by mouth daily with breakfast. (Patient taking differently: Take 20 mg by mouth daily with breakfast. ), Disp: 30 tablet, Rfl: 0 .  sitaGLIPtin (JANUVIA) 100 MG tablet, TAKE 1 TABLET DAILY, Disp: 90 tablet, Rfl: 0 .  traZODone (DESYREL) 50 MG tablet, Take 1 tablet (50 mg total) by mouth at bedtime., Disp: 30 tablet, Rfl: 0 .  vitamin B-12 (CYANOCOBALAMIN) 1000 MCG tablet, Take 1,000 mcg by mouth daily., Disp: , Rfl:   Social History   Tobacco Use  Smoking Status Current Every Day Smoker  . Packs/day: 0.40  . Years: 33.00  . Pack years: 13.20  . Types: Cigarettes  Smokeless Tobacco Never Used  Tobacco Comment   10/11/2018 - smoking more right now    Allergies  Allergen Reactions  . Gadolinium Derivatives Anaphylaxis and Other (See Comments)    Chest tightness  . Asa [Aspirin] Hives and Nausea And Vomiting  . Crestor [Rosuvastatin Calcium] Hives  and Itching  . Penicillins Hives and Itching    Did it involve swelling of the face/tongue/throat, SOB, or low BP? No Did it involve sudden or severe rash/hives, skin peeling, or any reaction on the inside of your mouth or nose? Yes Did you need to seek medical attention at a hospital or doctor's office? No When did it last happen?2 years If all above answers are "NO", may proceed with cephalosporin use.   . Chantix [Varenicline Tartrate] Other (See Comments)    "gave me nightmares"  . Iodine Rash    On skin    Objective:   There were no vitals  filed for this visit. There is no height or weight on file to calculate BMI. Constitutional Well developed. Well nourished.  Vascular Dorsalis pedis pulses barely palpable bilaterally  Posterior tibial pulses barely palpable bilaterally  Pedal hair growth absent. Capillary refill normal to all digits.  No cyanosis or clubbing noted.  Neurologic Normal speech. Oriented to person, place, and time. Epicritic sensation to light touch grossly present bilaterally. Protective sensation with 5.07 monofilament  present bilaterally.  Dermatologic Nails elongated, thickened, dystrophic. No open wounds. No skin lesions.  Orthopedic: Normal joint ROM without pain or crepitus bilaterally. No visible deformities. POP 3rd interspace bilat   Assessment:   No diagnosis found. Plan:  Patient was evaluated and treated and all questions answered.   Interdigital Neuroma, bilaterally -Injection #2 as below  Procedure: Neuroma Injection Location: Bilateral 3rd interspace Skin Prep: Alcohol. Injectate: 0.5 cc 0.5% marcaine plain, 0.5 cc dexamethasone phosphate. Disposition: Patient tolerated procedure well. Injection site dressed with a band-aid.   Return in about 3 weeks (around 10/15/2018) for Neuroma, Bilateral, DFC .

## 2018-10-13 NOTE — Progress Notes (Signed)
  Progress Note    10/13/2018 8:42 AM 1 Day Post-Op  Subjective:  No overnight issues  Vitals:   10/13/18 0432 10/13/18 0602  BP: 113/65   Pulse: 74 77  Resp: 17 (!) 23  Temp: 98.1 F (36.7 C)   SpO2: 96% 100%    Physical Exam: aaox3 Left face incision cdi  CBC    Component Value Date/Time   WBC 6.2 10/12/2018 1830   RBC 4.36 10/12/2018 1830   HGB 11.0 (L) 10/12/2018 1830   HGB 10.1 (L) 08/17/2018 1538   HCT 36.6 10/12/2018 1830   HCT 32.9 (L) 08/17/2018 1538   PLT 162 10/12/2018 1830   PLT 296 08/17/2018 1538   MCV 83.9 10/12/2018 1830   MCV 80 08/17/2018 1538   MCH 25.2 (L) 10/12/2018 1830   MCHC 30.1 10/12/2018 1830   RDW 17.4 (H) 10/12/2018 1830   RDW 16.7 (H) 08/17/2018 1538   LYMPHSABS 2.9 10/31/2014 1750   MONOABS 0.5 10/31/2014 1750   EOSABS 0.1 10/31/2014 1750   BASOSABS 0.0 10/31/2014 1750    BMET    Component Value Date/Time   NA 138 10/12/2018 0939   NA 143 12/25/2017 1635   K 4.3 10/12/2018 0939   CL 101 10/12/2018 0939   CO2 27 10/12/2018 0939   GLUCOSE 102 (H) 10/12/2018 0939   BUN 18 10/12/2018 0939   BUN 10 12/25/2017 1635   CREATININE 1.39 (H) 10/12/2018 1830   CREATININE 1.31 (H) 04/19/2014 1500   CALCIUM 9.0 10/12/2018 0939   CALCIUM 10.2 11/16/2009 2219   GFRNONAA 39 (L) 10/12/2018 1830   GFRNONAA 43 (L) 04/19/2014 1500   GFRAA 45 (L) 10/12/2018 1830   GFRAA 49 (L) 04/19/2014 1500    INR    Component Value Date/Time   INR 1.0 04/20/2008 1211     Intake/Output Summary (Last 24 hours) at 10/13/2018 0842 Last data filed at 10/12/2018 2200 Gross per 24 hour  Intake 1430 ml  Output 15 ml  Net 1415 ml     Assessment/plan:  68 y.o. female is s/p left temporal artery biopsy, stayed in obs for social reasons. Continues to have right hip/buttock pain thought at last visit to be neurogenic in nature but will have her routine vascular f/u in 8-12 weeks.   Brandon C. Donzetta Matters, MD [facility]  Office: [phone] Pager: [phone]  10/13/2018 8:42 AM

## 2018-10-13 NOTE — Anesthesia Postprocedure Evaluation (Signed)
Anesthesia Post Note  Patient: [name]  Procedure(s) Performed: LEFT TEMPORAL ARTERY BIOPSY (Left )     Patient location during evaluation: PACU Anesthesia Type: MAC Level of consciousness: awake and alert Pain management: pain level controlled Vital Signs Assessment: post-procedure vital signs reviewed and stable Respiratory status: spontaneous breathing, nonlabored ventilation, respiratory function stable and patient connected to nasal cannula oxygen Cardiovascular status: stable and blood pressure returned to baseline Postop Assessment: no apparent nausea or vomiting Anesthetic complications: no    Last Vitals:  Vitals:   10/13/18 0602 10/13/18 1053  BP:  120/63  Pulse: 77   Resp: (!) 23 18  Temp:  36.9 C  SpO2: 100% 94%    Last Pain:  Vitals:   10/13/18 1053  TempSrc: Oral  PainSc:                  Anthoney Sheppard

## 2018-10-13 NOTE — Progress Notes (Addendum)
When patient is asleep her o2 sats drop in the mid to low 80's with good wave form. She will not keep her o2 on. With o2 on her sats are mid upper 90's when sleeping.

## 2018-10-13 NOTE — Progress Notes (Signed)
D/C instructions given to pt. Medications and wound care reviewed. All questions answered. IV removed, clean and intact. [facility] bus to escort pt home.  Clyde Canterbury, RN

## 2018-10-14 ENCOUNTER — Telehealth: Payer: Self-pay | Admitting: Internal Medicine

## 2018-10-14 ENCOUNTER — Encounter: Payer: Self-pay | Admitting: Internal Medicine

## 2018-10-14 NOTE — Telephone Encounter (Signed)
-----   Message from Lars Mage, MD sent at 10/14/2018  8:49 AM EDT ----- Regarding: RE: She needs to be tapered off of her steroids sooner rather than later that is why I was asking ----- Message ----- From: Meriam Sprague Sent: 10/14/2018   8:46 AM EDT To: Lars Mage, MD  Good morning,  In reference to message below.  Patient is already scheduled to see you next Tuesday 10/19/2018 at 1:15 pm.  Lorita Officer, MD  Axel Filler, MD Cc: P Imp Front Desk Pool    Patient without changes consistent with temporal arteritis. She needs to be tapered off of prednisone. Needs to in person follow up with me or with acc.  Associated Results  Result Notes for Surgical pathology  Notes recorded by Lars Mage, MD on 10/13/2018 at 1:22 PM EDT  Patient without changes consistent with temporal arteritis. She needs to be tapered off of prednisone. Needs to in person follow up with me or with acc. Surgical pathology

## 2018-10-14 NOTE — Telephone Encounter (Signed)
Pt called back and would to keep her 10/19/2018 due to her transportation already being arranged with [facility].

## 2018-10-14 NOTE — Telephone Encounter (Signed)
Per [place] patient needs to be seen before next Tuesday 10/19/2018 to be tapered off steroids.  Attempted to call patient today, but no answer left detailed message asking to please call back.

## 2018-10-14 NOTE — Telephone Encounter (Signed)
Ok that is fine

## 2018-10-15 ENCOUNTER — Ambulatory Visit: Payer: Medicare HMO | Admitting: Vascular Surgery

## 2018-10-15 ENCOUNTER — Ambulatory Visit: Payer: Medicare HMO | Admitting: Podiatry

## 2018-10-17 NOTE — Progress Notes (Signed)
   CC: Hypertension follow up  HPI:  [name] is a 68 y.o. female with chronic htn, dm2, pad, hypothyroidism, anxiety disorder who presents for diabetes follow up. Please see problem based charting for evaluation, assessment, and plan.  Past Medical History:  Diagnosis Date  . Allergy   . Anal fissure   . Anemia   . Anxiety   . Blurry vision 09/19/2014  . Carpal tunnel syndrome    left  . Cataract   . Chronic headaches   . Complication of anesthesia    "fight when I come out from anesthesia"  . Depression   . Diverticulosis   . Estrogen deficiency 02/05/2018  . GERD (gastroesophageal reflux disease)   . Glaucoma   . HLD (hyperlipidemia)   . Hypertension   . Hypothyroidism   . Infestation by bed bug 07/29/2018   - hydrocodone acetate for itching - provided information on how to treat for bed bugs  - letter provided to help her obtain assistance for moving.   Marland Kitchen PAD (peripheral artery disease) ([place])   . PAD (peripheral artery disease) ([place])   . PUD (peptic ulcer disease)   . TIA (transient ischemic attack)   . Type 2 diabetes mellitus with complication ([place]) [id number]   Review of Systems:    Review of Systems  Constitutional: Positive for weight loss. Negative for chills and fever.  Respiratory: Negative for cough.   Gastrointestinal: Positive for abdominal pain and heartburn.  Neurological: Positive for dizziness and headaches.     Physical Exam:  Vitals:   10/19/18 1318  BP: 124/60  Pulse: 85  Temp: 98.3 F (36.8 C)  TempSrc: Oral  SpO2: 96%  Weight: 209 lb 12.8 oz (95.2 kg)  Height: 5\' 1"  (1.549 m)   Physical Exam  Constitutional: She is oriented to person, place, and time. She appears well-developed and well-nourished. No distress.  HENT:  Head: Normocephalic and atraumatic.  Eyes: Conjunctivae are normal.  Cardiovascular: Normal rate and regular rhythm.  Respiratory: Effort normal and breath sounds normal. No respiratory distress. She has no  wheezes.  GI: Soft. Bowel sounds are normal. She exhibits no distension. There is abdominal tenderness (epigastric and upper abdomen).  Musculoskeletal:        General: No edema.  Neurological: She is alert and oriented to person, place, and time.  Skin: She is not diaphoretic.  Psychiatric: Her behavior is normal. Judgment and thought content normal. Her mood appears anxious.    Assessment & Plan:   See Encounters Tab for problem based charting.  Patient discussed with Dr. Rebeca Alert

## 2018-10-18 ENCOUNTER — Telehealth (HOSPITAL_COMMUNITY): Payer: Self-pay | Admitting: Pharmacist

## 2018-10-18 ENCOUNTER — Ambulatory Visit (AMBULATORY_SURGERY_CENTER): Payer: Medicare Other | Admitting: Internal Medicine

## 2018-10-18 ENCOUNTER — Other Ambulatory Visit: Payer: Self-pay | Admitting: *Deleted

## 2018-10-18 ENCOUNTER — Encounter: Payer: Self-pay | Admitting: Internal Medicine

## 2018-10-18 ENCOUNTER — Other Ambulatory Visit: Payer: Self-pay

## 2018-10-18 VITALS — BP 128/64 | HR 85 | Temp 98.6°F | Resp 20 | Ht 61.0 in | Wt 214.0 lb

## 2018-10-18 DIAGNOSIS — B370 Candidal stomatitis: Secondary | ICD-10-CM

## 2018-10-18 DIAGNOSIS — D508 Other iron deficiency anemias: Secondary | ICD-10-CM | POA: Diagnosis not present

## 2018-10-18 DIAGNOSIS — D123 Benign neoplasm of transverse colon: Secondary | ICD-10-CM

## 2018-10-18 DIAGNOSIS — D122 Benign neoplasm of ascending colon: Secondary | ICD-10-CM | POA: Diagnosis not present

## 2018-10-18 DIAGNOSIS — K5520 Angiodysplasia of colon without hemorrhage: Secondary | ICD-10-CM

## 2018-10-18 DIAGNOSIS — K31819 Angiodysplasia of stomach and duodenum without bleeding: Secondary | ICD-10-CM

## 2018-10-18 MED ORDER — FERROUS SULFATE 325 (65 FE) MG PO TBEC: 1.0000 | DELAYED_RELEASE_TABLET | Freq: Every day | ORAL | 3 refills | Status: DC

## 2018-10-18 MED ORDER — SODIUM CHLORIDE 0.9 % IV SOLN: 500.0000 mL | Freq: Once | INTRAVENOUS | Status: DC

## 2018-10-18 NOTE — Telephone Encounter (Signed)
Per [name], patient is only taking metformin and sitagliptin. Marking Lantus for removal from med list.  Kennon Holter, PharmD PGY1 [facility] Pharmacy Resident [facility] Phone: [phone]

## 2018-10-18 NOTE — Patient Instructions (Signed)
Thank you for allowing [place] to care for you today!  Await pathology results by mail, approximately 2 weeks.  Resume previous medications today, including Plavix.  Take an Iron supplement EVERY DAY INDEFINITELY.   Have your primary care physician carefully monitor your blood counts and iron levels.  You may need periodic iron infusion therapy.  Resume your previous diet today.  Return to your normal activities tomorrow.  Repeat colonoscopy in 5 years for surveillance.   YOU HAD AN ENDOSCOPIC PROCEDURE TODAY AT [facility]:   Refer to the procedure report that was given to you for any specific questions about what was found during the examination.  If the procedure report does not answer your questions, please call your gastroenterologist to clarify.  If you requested that your care partner not be given the details of your procedure findings, then the procedure report has been included in a sealed envelope for you to review at your convenience later.  YOU SHOULD EXPECT: Some feelings of bloating in the abdomen. Passage of more gas than usual.  Walking can help get rid of the air that was put into your GI tract during the procedure and reduce the bloating. If you had a lower endoscopy (such as a colonoscopy or flexible sigmoidoscopy) you may notice spotting of blood in your stool or on the toilet paper. If you underwent a bowel prep for your procedure, you may not have a normal bowel movement for a few days.  Please Note:  You might notice some irritation and congestion in your nose or some drainage.  This is from the oxygen used during your procedure.  There is no need for concern and it should clear up in a day or so.  SYMPTOMS TO REPORT IMMEDIATELY:   Following lower endoscopy (colonoscopy or flexible sigmoidoscopy):  Excessive amounts of blood in the stool  Significant tenderness or worsening of abdominal pains  Swelling of the abdomen that is new, acute  Fever of 100F or  higher   Following upper endoscopy (EGD)  Vomiting of blood or coffee ground material  New chest pain or pain under the shoulder blades  Painful or persistently difficult swallowing  New shortness of breath  Fever of 100F or higher  Black, tarry-looking stools  For urgent or emergent issues, a gastroenterologist can be reached at any hour by calling [phone].   DIET:  We do recommend a small meal at first, but then you may proceed to your regular diet.  Drink plenty of fluids but you should avoid alcoholic beverages for 24 hours.  ACTIVITY:  You should plan to take it easy for the rest of today and you should NOT DRIVE or use heavy machinery until tomorrow (because of the sedation medicines used during the test).    FOLLOW UP: Our staff will call the number listed on your records 48-72 hours following your procedure to check on you and address any questions or concerns that you may have regarding the information given to you following your procedure. If we do not reach you, we will leave a message.  We will attempt to reach you two times.  During this call, we will ask if you have developed any symptoms of COVID 19. If you develop any symptoms (ie: fever, flu-like symptoms, shortness of breath, cough etc.) before then, please call [phone].  If you test positive for Covid 19 in the 2 weeks post procedure, please call and report this information to [place].  If any biopsies were taken you will be contacted by phone or by letter within the next 1-3 weeks.  Please call us at [phone] if you have not heard about the biopsies in 3 weeks.    SIGNATURES/CONFIDENTIALITY: You and/or your care partner have signed paperwork which will be entered into your electronic medical record.  These signatures attest to the fact that that the information above on your After Visit Summary has been reviewed and is understood.  Full responsibility of the confidentiality of this discharge information  lies with you and/or your care-partner.

## 2018-10-18 NOTE — Progress Notes (Signed)
PT taken to PACU. Monitors in place. VSS. Report given to RN. 

## 2018-10-18 NOTE — Patient Outreach (Signed)
[facility]) Care Management  10/18/2018  [name] 07/21/1950 [id number]   CSW spoke with pt who reported she has a coloscopy/endoscopy later today. She is doing ok and asks that I call back on Wednesday.     Eduard Clos, MSW, LCSW Clinical Social Worker  Herreid Langdon, MSW, [place] Worker  [place] [phone]

## 2018-10-18 NOTE — Op Note (Signed)
[place] Patient Name: [name] Procedure Date: 10/18/2018 2:22 PM MRN: [id number] Endoscopist: Docia Chuck. Henrene Pastor , MD Age: 68 Referring MD:  Date of Birth: September 30, 1950 Gender: Female Account #: [id number] Procedure:                Colonoscopy with cold snare polypectomy x 3 Indications:              Iron deficiency anemia. Previous examination                            December 2015 with non-adenomatous polyps and                            diverticulosis Medicines:                Monitored Anesthesia Care Procedure:                Pre-Anesthesia Assessment:                           - Prior to the procedure, a History and Physical                            was performed, and patient medications and                            allergies were reviewed. The patient's tolerance of                            previous anesthesia was also reviewed. The risks                            and benefits of the procedure and the sedation                            options and risks were discussed with the patient.                            All questions were answered, and informed consent                            was obtained. Prior Anticoagulants: The patient has                            taken Plavix (clopidogrel), last dose was 6 days                            prior to procedure. ASA Grade Assessment: III - A                            patient with severe systemic disease. After                            reviewing the risks and benefits, the patient was  deemed in satisfactory condition to undergo the                            procedure.                           After obtaining informed consent, the colonoscope                            was passed under direct vision. Throughout the                            procedure, the patient's blood pressure, pulse, and                            oxygen saturations were monitored continuously. The                        Colonoscope was introduced through the anus and                            advanced to the the cecum, identified by                            appendiceal orifice and ileocecal valve. The                            terminal ileum, ileocecal valve, appendiceal                            orifice, and rectum were photographed. The quality                            of the bowel preparation was excellent. The                            colonoscopy was performed without difficulty. The                            patient tolerated the procedure well. The bowel                            preparation used was SUPREP via split dose                            instruction. Scope In: 2:30:45 PM Scope Out: 2:48:37 PM Scope Withdrawal Time: 0 hours 13 minutes 9 seconds  Total Procedure Duration: 0 hours 17 minutes 52 seconds  Findings:                 Three polyps were found in the transverse colon and                            ascending colon. The polyps were 2 to 4 mm in size.  These polyps were removed with a cold snare.                            Resection and retrieval were complete.                           Multiple diverticula were found in the entire colon.                           The exam was otherwise without abnormality on                            direct and retroflexion views.                           The terminal ileum appeared normal. Complications:            No immediate complications. Estimated blood loss:                            None. Estimated Blood Loss:     Estimated blood loss: none. Impression:               - Three 2 to 4 mm polyps in the transverse colon                            and in the ascending colon, removed with a cold                            snare. Resected and retrieved.                           - Diverticulosis in the entire examined colon.                           - The examination was otherwise normal  on direct                            and retroflexion views.                           - The examined portion of the ileum was normal. Recommendation:           - Repeat colonoscopy in 5 years for surveillance.                           - Resume Plavix (clopidogrel) today at prior dose.                           - Patient has a contact number available for                            emergencies. The signs and symptoms of potential  delayed complications were discussed with the                            patient. Return to normal activities tomorrow.                            Written discharge instructions were provided to the                            patient.                           - Resume previous diet.                           - Continue present medications.                           - Await pathology results. Docia Chuck. Henrene Pastor, MD 10/18/2018 2:52:22 PM This report has been signed electronically.

## 2018-10-18 NOTE — Op Note (Signed)
[place] Patient Name: [name] Procedure Date: 10/18/2018 2:21 PM MRN: [id number] Endoscopist: Docia Chuck. Henrene Pastor , MD Age: 68 Referring MD:  Date of Birth: 06-26-1950 Gender: Female Account #: [id number] Procedure:                Upper GI endoscopy Indications:              Iron deficiency anemia Medicines:                Monitored Anesthesia Care Procedure:                Pre-Anesthesia Assessment:                           - Prior to the procedure, a History and Physical                            was performed, and patient medications and                            allergies were reviewed. The patient's tolerance of                            previous anesthesia was also reviewed. The risks                            and benefits of the procedure and the sedation                            options and risks were discussed with the patient.                            All questions were answered, and informed consent                            was obtained. Prior Anticoagulants: The patient has                            taken Plavix (clopidogrel), last dose was 6 days                            prior to procedure. ASA Grade Assessment: III - A                            patient with severe systemic disease. After                            reviewing the risks and benefits, the patient was                            deemed in satisfactory condition to undergo the                            procedure.  After obtaining informed consent, the endoscope was                            passed under direct vision. Throughout the                            procedure, the patient's blood pressure, pulse, and                            oxygen saturations were monitored continuously. The                            Endoscope was introduced through the mouth, and                            advanced to the third part of duodenum. The upper            GI endoscopy was accomplished without difficulty.                            The patient tolerated the procedure well. Scope In: Scope Out: Findings:                 The esophagus was normal.                           The stomach was normal.                           A few small angiodysplastic lesions without                            bleeding were found in the second portion of the                            duodenum.                           The examined duodenum was normal.                           The cardia and gastric fundus were normal on                            retroflexion. Complications:            No immediate complications. Estimated Blood Loss:     Estimated blood loss: none. Impression:               1. Nonbleeding small bowel AVMs. This is the cause                            for iron deficiency anemia                           2. Otherwise normal EGD. Recommendation:           1. Take an iron supplement EVERY DAY  INDEFINITELY                           2. Resume previous medications                           3. Resume Plavix today                           4. Have your primary care provider carefully                            monitor your blood counts and iron levels. You may                            require periodic iron infusion therapy.                           5. GI follow-up as needed Docia Chuck. Henrene Pastor, MD 10/18/2018 3:06:03 PM This report has been signed electronically.

## 2018-10-18 NOTE — Progress Notes (Signed)
Called to room to assist during endoscopic procedure.  Patient ID and intended procedure confirmed with present staff. Received instructions for my participation in the procedure from the performing physician.  

## 2018-10-18 NOTE — Progress Notes (Signed)
Pt's states no medical or surgical changes since previsit or office visit.  KA temps, CW vitals, CW IV.

## 2018-10-19 ENCOUNTER — Other Ambulatory Visit: Payer: Self-pay

## 2018-10-19 ENCOUNTER — Ambulatory Visit (INDEPENDENT_AMBULATORY_CARE_PROVIDER_SITE_OTHER): Payer: Medicare Other | Admitting: Licensed Clinical Social Worker

## 2018-10-19 ENCOUNTER — Ambulatory Visit (INDEPENDENT_AMBULATORY_CARE_PROVIDER_SITE_OTHER): Payer: Medicare Other | Admitting: Internal Medicine

## 2018-10-19 ENCOUNTER — Encounter: Payer: Self-pay | Admitting: Internal Medicine

## 2018-10-19 VITALS — BP 124/60 | HR 85 | Temp 98.3°F | Ht 61.0 in | Wt 209.8 lb

## 2018-10-19 DIAGNOSIS — Z86010 Personal history of colonic polyps: Secondary | ICD-10-CM

## 2018-10-19 DIAGNOSIS — K5520 Angiodysplasia of colon without hemorrhage: Secondary | ICD-10-CM | POA: Diagnosis not present

## 2018-10-19 DIAGNOSIS — F17200 Nicotine dependence, unspecified, uncomplicated: Secondary | ICD-10-CM

## 2018-10-19 DIAGNOSIS — Z6839 Body mass index (BMI) 39.0-39.9, adult: Secondary | ICD-10-CM

## 2018-10-19 DIAGNOSIS — I10 Essential (primary) hypertension: Secondary | ICD-10-CM | POA: Diagnosis not present

## 2018-10-19 DIAGNOSIS — F331 Major depressive disorder, recurrent, moderate: Secondary | ICD-10-CM

## 2018-10-19 DIAGNOSIS — E1169 Type 2 diabetes mellitus with other specified complication: Secondary | ICD-10-CM

## 2018-10-19 DIAGNOSIS — R569 Unspecified convulsions: Secondary | ICD-10-CM

## 2018-10-19 DIAGNOSIS — K635 Polyp of colon: Secondary | ICD-10-CM | POA: Insufficient documentation

## 2018-10-19 DIAGNOSIS — R109 Unspecified abdominal pain: Secondary | ICD-10-CM

## 2018-10-19 DIAGNOSIS — Z7984 Long term (current) use of oral hypoglycemic drugs: Secondary | ICD-10-CM

## 2018-10-19 DIAGNOSIS — F419 Anxiety disorder, unspecified: Secondary | ICD-10-CM

## 2018-10-19 DIAGNOSIS — E785 Hyperlipidemia, unspecified: Secondary | ICD-10-CM

## 2018-10-19 DIAGNOSIS — F332 Major depressive disorder, recurrent severe without psychotic features: Secondary | ICD-10-CM

## 2018-10-19 DIAGNOSIS — F17210 Nicotine dependence, cigarettes, uncomplicated: Secondary | ICD-10-CM

## 2018-10-19 DIAGNOSIS — E1151 Type 2 diabetes mellitus with diabetic peripheral angiopathy without gangrene: Secondary | ICD-10-CM

## 2018-10-19 DIAGNOSIS — R42 Dizziness and giddiness: Secondary | ICD-10-CM

## 2018-10-19 DIAGNOSIS — E039 Hypothyroidism, unspecified: Secondary | ICD-10-CM | POA: Diagnosis not present

## 2018-10-19 DIAGNOSIS — Z79890 Hormone replacement therapy (postmenopausal): Secondary | ICD-10-CM

## 2018-10-19 DIAGNOSIS — D509 Iron deficiency anemia, unspecified: Secondary | ICD-10-CM

## 2018-10-19 DIAGNOSIS — R634 Abnormal weight loss: Secondary | ICD-10-CM

## 2018-10-19 DIAGNOSIS — R519 Headache, unspecified: Secondary | ICD-10-CM

## 2018-10-19 DIAGNOSIS — R12 Heartburn: Secondary | ICD-10-CM

## 2018-10-19 LAB — GLUCOSE, CAPILLARY: Glucose-Capillary: 121 mg/dL — ABNORMAL HIGH (ref 70–99)

## 2018-10-19 MED ORDER — ATORVASTATIN CALCIUM 40 MG PO TABS: 40.0000 mg | ORAL_TABLET | Freq: Every day | ORAL | 1 refills | Status: AC

## 2018-10-19 MED ORDER — NICOTINE 14 MG/24HR TD PT24: 14.0000 mg | MEDICATED_PATCH | TRANSDERMAL | 0 refills | Status: AC

## 2018-10-19 MED ORDER — SUMATRIPTAN SUCCINATE 50 MG PO TABS: 50.0000 mg | ORAL_TABLET | Freq: Once | ORAL | 0 refills | Status: DC | PRN

## 2018-10-19 MED ORDER — FUROSEMIDE 20 MG PO TABS: 20.0000 mg | ORAL_TABLET | Freq: Every day | ORAL | 0 refills | Status: AC

## 2018-10-19 MED ORDER — METFORMIN HCL ER (MOD) 1000 MG PO TB24: 1000.0000 mg | ORAL_TABLET | Freq: Every day | ORAL | 1 refills | Status: DC

## 2018-10-19 MED ORDER — TRAZODONE HCL 50 MG PO TABS: 50.0000 mg | ORAL_TABLET | Freq: Every day | ORAL | 2 refills | Status: DC

## 2018-10-19 MED ORDER — DULOXETINE HCL 20 MG PO CPEP: 20.0000 mg | ORAL_CAPSULE | Freq: Two times a day (BID) | ORAL | 2 refills | Status: DC

## 2018-10-19 NOTE — Assessment & Plan Note (Signed)
The patient's last a1c=6.6 in September 2020. The patient is currently taking metformin 1000mg  qd, sitagliptin 100mg  qd. The patient is compliant with medication.    Patient's has lost approximately 5lbs over the past 1 month.  Assessment and plan  Continue current regimen.

## 2018-10-19 NOTE — Assessment & Plan Note (Signed)
The patient is currently taking 1/2ppd for the past 30 yrs. She states that she wants to quit smoking.   Assessment and plan  Counseled patient on decreasing the number of cigarettes in her pack to help get off cigarettes. She was previously given chantix but stopped using because she got vivid nightmates.   Do not wish to place patient on bupropion in cases there is a small possibility that the patient may have seizure disorder that was not caught on eeg and bupropion lowers the seizure threshold.   Will try nicotine patch daily first for 1 mont and if it does not work than will consider bupropion.

## 2018-10-19 NOTE — BH Specialist Note (Signed)
Integrated Behavioral Health Follow Up Visit  MRN: [id number] Name: [name]  Session Start time: 2:25  Session End time: 3:05 Total time: 40 minutes  Type of Service: Integrated Behavioral Health- Individual Interpretor:No.   SUBJECTIVE: [name] is a 67 y.o. female accompanied by whom attended the session individually.  Patient was referred by [name] for depression and anxiety. Patient reports the following symptoms/concerns: health concerns, panic attacks, sleep disturbances, loneliness, moderate levels of depression, housing issues, and lack of natural supports.  Duration of problem: recurrent since 2015; Severity of problem: moderate  OBJECTIVE: Mood: Depressed and Affect: Depressed Risk of harm to self or others: No plan to harm self or others  LIFE CONTEXT: Family and Social: Patient is continuing to process her sadness and fears that her family does not want her to move close to them or come visit. Patient has very few social supports, and could benefit from building her network.   GOALS ADDRESSED: Patient will: 1.  Reduce symptoms of: anxiety, depression and stress  2.  Increase knowledge and/or ability of: coping skills, healthy habits and stress reduction  3.  Demonstrate ability to: Increase healthy adjustment to current life circumstances and Increase adequate support systems for patient/family  INTERVENTIONS: Interventions utilized:  Mindfulness or Relaxation Training, Behavioral Activation, Brief CBT and Supportive Counseling Standardized Assessments completed: assessed for SI, HI, and self-harm.  ASSESSMENT: Patient currently experiencing ongoing levels of moderate depression and anxiety. Patient recently received results from neurological testing, and reported that they did not find any significant health related causes from her symptoms. Patient reported her doctor explained that some of her somatic symptoms are related to mental health. Patient  processed her feelings towards how her anxiety and depression can be impacting her health. Patient wants to gain control back over her anxiety in order to reduce her headaches and dizzy spells.     Patient may benefit from counseling.  PLAN: 1. Follow up with behavioral health clinician on : two weeks.   Dessie Coma, [facility], [place]

## 2018-10-19 NOTE — Assessment & Plan Note (Signed)
The patient's blood pressure during this visit was 124/60. The patient is currently taking lisinopril-hctz 20-25mg  qd, amlodipine 10mg  qd, lasix 20mg  qd. Her last blood pressure visits are   BP Readings from Last 3 Encounters:  10/19/18 124/60  10/18/18 128/64  10/13/18 120/63   Assessment and plan  Patient is at goal blood pressure. Continue current management.

## 2018-10-19 NOTE — Assessment & Plan Note (Signed)
Patient is following with gastroenterology for iron deficiency anemia. She had colonoscopy and upper endoscopy done 10/18/18 which showed nonbleeding small bowel avms that were thought to be the cause of IDA.   Colonoscopy showed three 2-50mm polylps in the transverse and ascending colon that were removed with cold snare.   The patient was told by GI to take iron supplements every day indefinitely. She was told to resume her plavix and follow up with GI on a prn basis.   Assessment and plan  The patient is currently stable and asymptomatic.   -continue ferrous sulfate 65mg  qd INDEFINITELY -follow up on pathology of polyps that were removed during colonoscopy 10/5 -follow up colonoscopy in 5 yrs

## 2018-10-19 NOTE — Progress Notes (Signed)
Internal Medicine Clinic Attending  Case discussed with Dr. Chundi at the time of the visit.  We reviewed the resident's history and exam and pertinent patient test results.  I agree with the assessment, diagnosis, and plan of care documented in the resident's note.  Alexander Raines, M.D., Ph.D.  

## 2018-10-19 NOTE — Assessment & Plan Note (Signed)
Patient had left temporal artery biopsy done by Dr. Donzetta Matters on 10/12/18. The pathology result returned negative for inflammation, granulomas, or fragmentation of elastic lamina.   Patient states that she continues to have sharp pain across the front side of her head that occurs at least once a day and lasts few hours. Worsens with bright lights and loud noises. She uses tylenol occasionally which help somewhat, but that does not help alleviate the pain all the time. Denies any accompanied nausea or vomiting. Has a bounding pulsating feeling with the pain across front aspect of her head.   Patient states that she used to have migraines as a teenager. She would put herself in a dark room without noises and that would help alleviate her symptoms.   Patient states that she stopped taking prednisone after she ran out of it and did not refill the prescription.   Assessment and plan  The patient's headaches seem consistent with migraines. Will give patient imitrex 50mg  qd prn #20 capsules per month. Encouraged her to avoid loud noises and bright lights.

## 2018-10-19 NOTE — Patient Instructions (Addendum)
It was a pleasure to see you today **. Please make the following changes:  -please start wearing nicotine patch daily to help with smoking cessation. By next visit goal is to be down to 5 cigarettes daily -please continue to work with Miquel Dunn  -please exercise and eat healthy. Weight loss will help with several aspects of your health  -follow up in 1 month   If you have any questions or concerns, please call our clinic at [phone] between 9am-5pm and after hours call [phone] and ask for the internal medicine resident on call. If you feel you are having a medical emergency please call 911.   Thank you, we look forward to help you remain healthy!  Lars Mage, MD Internal Medicine PGY3    Health Risks of Smoking Smoking cigarettes is very bad for your health. Tobacco smoke has over 200 known poisons in it. It contains the poisonous gases nitrogen oxide and carbon monoxide. There are over 60 chemicals in tobacco smoke that cause cancer. Smoking is difficult to quit because a chemical in tobacco, called nicotine, causes addiction or dependence. When you smoke and inhale, nicotine is absorbed rapidly into the bloodstream through your lungs. Both inhaled and non-inhaled nicotine may be addictive. What are the risks of cigarette smoke? Cigarette smokers have an increased risk of many serious medical problems, including:  Lung cancer.  Lung disease, such as pneumonia, bronchitis, and emphysema.  Chest pain (angina) and heart attack because the heart is not getting enough oxygen.  Heart disease and peripheral blood vessel disease.  High blood pressure (hypertension).  Stroke.  Oral cancer, including cancer of the lip, mouth, or voice box.  Bladder cancer.  Pancreatic cancer.  Cervical cancer.  Pregnancy complications, including premature birth.  Stillbirths and smaller newborn babies, birth defects, and genetic damage to sperm.  Early menopause.  Lower estrogen level  for women.  Infertility.  Facial wrinkles.  Blindness.  Increased risk of broken bones (fractures).  Senile dementia.  Stomach ulcers and internal bleeding.  Delayed wound healing and increased risk of complications during surgery.  Even smoking lightly shortens your life expectancy by several years. Because of secondhand smoke exposure, children of smokers have an increased risk of the following:  Sudden infant death syndrome (SIDS).  Respiratory infections.  Lung cancer.  Heart disease.  Ear infections. What are the benefits of quitting? There are many health benefits of quitting smoking. Here are some of them:  Within days of quitting smoking, your risk of having a heart attack decreases, your blood flow improves, and your lung capacity improves. Blood pressure, pulse rate, and breathing patterns start returning to normal soon after quitting.  Within months, your lungs may clear up completely.  Quitting for 10 years reduces your risk of developing lung cancer and heart disease to almost that of a nonsmoker.  People who quit may see an improvement in their overall quality of life. How do I quit smoking?     Smoking is an addiction with both physical and psychological effects, and longtime habits can be hard to change. Your health care provider can recommend:  Programs and community resources, which may include group support, education, or talk therapy.  Prescription medicines to help reduce cravings.  Nicotine replacement products, such as patches, gum, and nasal sprays. Use these products only as directed. Do not replace cigarette smoking with electronic cigarettes, which are commonly called e-cigarettes. The safety of e-cigarettes is not known, and some may contain harmful chemicals.  A  combination of two or more of these methods. Where to find more information  American Lung Association: www.lung.org  American Cancer Society:  www.cancer.org Summary  Smoking cigarettes is very bad for your health. Cigarette smokers have an increased risk of many serious medical problems, including several cancers, heart disease, and stroke.  Smoking is an addiction with both physical and psychological effects, and longtime habits can be hard to change.  By stopping right away, you can greatly reduce the risk of medical problems for you and your family.  To help you quit smoking, your health care provider can recommend programs, community resources, prescription medicines, and nicotine replacement products such as patches, gum, and nasal sprays. This information is not intended to replace advice given to you by your health care provider. Make sure you discuss any questions you have with your health care provider. Document Released: 02/07/2004 Document Revised: 04/02/2017 Document Reviewed: 01/04/2016 Elsevier Patient Education  2020 [employer].

## 2018-10-19 NOTE — Assessment & Plan Note (Signed)
Patient had eeg done in August 2020 which showed no epileptic events. 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   -CBT with integrated behavioral health  -follow with Dr. Jaynee Eagles neurology

## 2018-10-19 NOTE — Assessment & Plan Note (Signed)
Continue Levothyroxine 151mcg qd

## 2018-10-20 ENCOUNTER — Other Ambulatory Visit: Payer: Self-pay | Admitting: *Deleted

## 2018-10-20 ENCOUNTER — Encounter: Payer: Self-pay | Admitting: Licensed Clinical Social Worker

## 2018-10-20 ENCOUNTER — Telehealth: Payer: Self-pay

## 2018-10-20 ENCOUNTER — Encounter: Payer: Self-pay | Admitting: *Deleted

## 2018-10-20 LAB — BMP8+ANION GAP
Anion Gap: 15 mmol/L (ref 10.0–18.0)
BUN/Creatinine Ratio: 6 — ABNORMAL LOW (ref 12–28)
BUN: 10 mg/dL (ref 8–27)
CO2: 23 mmol/L (ref 20–29)
Calcium: 9.5 mg/dL (ref 8.7–10.3)
Chloride: 103 mmol/L (ref 96–106)
Creatinine, Ser: 1.58 mg/dL — ABNORMAL HIGH (ref 0.57–1.00)
GFR calc Af Amer: 38 mL/min/{1.73_m2} — ABNORMAL LOW (ref >59)
GFR calc non Af Amer: 33 mL/min/{1.73_m2} — ABNORMAL LOW (ref >59)
Glucose: 86 mg/dL (ref 65–99)
Potassium: 4.7 mmol/L (ref 3.5–5.2)
Sodium: 141 mmol/L (ref 134–144)

## 2018-10-20 NOTE — Patient Outreach (Signed)
[facility]) Care Management  10/20/2018  SHAYLAN MISQUEZ [birth year] [id number]   CSW spoke with pt who reports her procedure went well earlier this week. She is pleased with the findings/results of the endoscopy/coloscopy.  Per pt, she saw her therapist yesterday and all is going well with that.   CSW offered support and encouragement. CSW discussed plans for case closure with pt and pt agreeable.  CSW will advise PCP and [facility] team of above.   Eduard Clos, MSW, [place] Worker  [place] [phone]

## 2018-10-20 NOTE — Telephone Encounter (Signed)
LVM

## 2018-10-20 NOTE — Telephone Encounter (Signed)
  Follow up Call-  Call back number 10/18/2018  Post procedure Call Back phone  # [phone]  Permission to leave phone message Yes  Some recent data might be hidden     Patient questions:  Do you have a fever, pain , or abdominal swelling? No. Pain Score  0 *  Have you tolerated food without any problems? Yes.    Have you been able to return to your normal activities? Yes.    Do you have any questions about your discharge instructions: Diet   No. Medications  No. Follow up visit  No.  Do you have questions or concerns about your Care? No.  Actions: * If pain score is 4 or above: No action needed, pain <4. 1. Have you developed a fever since your procedure? no  2.   Have you had an respiratory symptoms (SOB or cough) since your procedure? no  3.   Have you tested positive for COVID 19 since your procedure no  4.   Have you had any family members/close contacts diagnosed with the COVID 19 since your procedure?  no   If yes to any of these questions please route to Joylene John, RN and Alphonsa Gin, Therapist, sports.

## 2018-10-20 NOTE — Patient Outreach (Signed)
[facility]) Care Management  10/20/2018  [name] 1950-07-21 [id number]   [facility] Follow up/Outreach  Referral Date:06/09/2017 Referral Source:Transfer from [place] Reason for Referral:Continued Disease Management Education Insurance:[employer] Medicare   Outreach Attempt:  Successful telephone outreach to patient for post surgical follow up.  HIPAA verified with patient.  Patient reporting completing her temporal biopsy as well as her Colonoscopy/Endoscopy.  States her biopsy results were negative.  Her colon/endoscopy showed some AVMs and polyps.  Patient reports she feels overall tired from the procedures.  Encouraged to get some rest.  Acknowledges some of her symptoms are related to her anxiety and stress and states she is hoping to get better with continued therapy sessions.  Fasting blood sugar this morning was 148 and patient stating her blood sugars is starting to trend back to normal after her procedures.  Continues to reports hoping to move closer to her family, but feels in may be in the next few months.  Appointments:   Attended appointment with primary care provider, [name] on 10/19/2018 and has scheduled follow up on 12/07/2018.  Plan: RN Health Coach will send patient EMMI Quitting Smoking. RN Health Coach will make next telephone outreach to patient within the month of November.  [place] Coach [phone] [email or web address]

## 2018-10-22 ENCOUNTER — Encounter: Payer: Self-pay | Admitting: Internal Medicine

## 2018-10-22 ENCOUNTER — Telehealth: Payer: Self-pay | Admitting: *Deleted

## 2018-10-22 DIAGNOSIS — E1169 Type 2 diabetes mellitus with other specified complication: Secondary | ICD-10-CM

## 2018-10-22 NOTE — Telephone Encounter (Signed)
Called pt - no answer; left message to call the call the office for lab results.

## 2018-10-22 NOTE — Telephone Encounter (Signed)
-----   Message from Lars Mage, MD sent at 10/21/2018  7:05 PM EDT ----- Patients creatinine seems to continue to be slightly elevated, she should hydrate herself well. We will continue to monitor this. Other electrolytes are wnl range.

## 2018-10-22 NOTE — Telephone Encounter (Signed)
[facility] calls to verify instructions on metformin, VO for  METFORMIN 1000MG  24 HR TABLET TAKE 1 TABLET BY MOUTH DAILY #90 W/ 1 ADDITIONAL REFILL Please change script if you agree, removing the BEFORE LUNCH AND SUPPER.  If do not agree please send new script

## 2018-10-24 MED ORDER — METFORMIN HCL ER (MOD) 1000 MG PO TB24: 1000.0000 mg | ORAL_TABLET | Freq: Every day | ORAL | 1 refills | Status: DC

## 2018-10-25 ENCOUNTER — Telehealth: Payer: Self-pay

## 2018-10-25 NOTE — Telephone Encounter (Signed)
Requesting lab results, please call back.  

## 2018-10-25 NOTE — Telephone Encounter (Signed)
Rtc, lm for rtc 

## 2018-10-26 ENCOUNTER — Other Ambulatory Visit: Payer: Self-pay | Admitting: *Deleted

## 2018-10-26 ENCOUNTER — Other Ambulatory Visit: Payer: Self-pay | Admitting: Internal Medicine

## 2018-10-26 DIAGNOSIS — M5431 Sciatica, right side: Secondary | ICD-10-CM

## 2018-10-26 DIAGNOSIS — M5432 Sciatica, left side: Secondary | ICD-10-CM

## 2018-10-26 MED ORDER — GABAPENTIN 400 MG PO CAPS: ORAL_CAPSULE | ORAL | 0 refills | Status: DC

## 2018-10-26 NOTE — Telephone Encounter (Signed)
Spoke w/ pt, gave her dr [employer], she ask for refill of gabapentin

## 2018-10-26 NOTE — Telephone Encounter (Signed)
sent 

## 2018-10-29 ENCOUNTER — Ambulatory Visit: Payer: Medicare HMO | Admitting: Vascular Surgery

## 2018-11-02 ENCOUNTER — Ambulatory Visit: Payer: Medicare Other | Admitting: Licensed Clinical Social Worker

## 2018-11-23 ENCOUNTER — Other Ambulatory Visit: Payer: Self-pay

## 2018-11-23 DIAGNOSIS — M5431 Sciatica, right side: Secondary | ICD-10-CM

## 2018-11-23 NOTE — Telephone Encounter (Signed)
fluconazole (DIFLUCAN) 100 MG tablet   gabapentin (NEURONTIN) 400 MG capsule, REFILL REQUEST @  [facility] [phone] - [place], [place] [phone] (Phone) [phone] (Fax)

## 2018-11-24 MED ORDER — GABAPENTIN 400 MG PO CAPS: ORAL_CAPSULE | ORAL | 0 refills | Status: DC

## 2018-11-25 ENCOUNTER — Ambulatory Visit (INDEPENDENT_AMBULATORY_CARE_PROVIDER_SITE_OTHER): Payer: Medicare HMO | Admitting: Podiatry

## 2018-11-25 DIAGNOSIS — Z9889 Other specified postprocedural states: Secondary | ICD-10-CM

## 2018-11-25 NOTE — Progress Notes (Signed)
No show for appt - same day cancellation. 

## 2018-12-02 ENCOUNTER — Ambulatory Visit: Payer: Medicare Other | Admitting: Podiatry

## 2018-12-04 NOTE — Progress Notes (Deleted)
   CC: ***  HPI:  Ms.Lakisha B Lancon is a 68 y.o. female with diabetes mellitus type 2, pad, hypertension, hypothyroidism who presents for follow up of diabetes. Please see problem based charting for evaluation, assessment, and plan.  The patient's last a1c= 6.6 on 09/21/18. The patient's home  blood glucose measurements over the past month have ranged ***. The patient does/does not note episodes of hypoglycemia.   The patient is currently taking sitagliptin 100mg  qd, metformin 1000mg  qd  . The patient is compliant with medication.    Patient's weight changes ***   Patient is hypothyroid s/p thyroidectomy in 2011. She is currently taing levothyroxine 175mcg qd. Her last tsh was 3.910 in January 2020. Symptoms ***   The patient's blood pressure during this visit was ***. The patient is currently taking lisinopril-hctz 20-25mg  qd. His/her *** last blood pressure visits are  BP Readings from Last 3 Encounters:  10/19/18 124/60  10/18/18 128/64  10/13/18 120/63    The patient does/does not *** report palpitations, dizziness, chest pain, sob ***.  Assessment and Plan ***     Past Medical History:  Diagnosis Date  . Allergy   . Anal fissure   . Anemia   . Anxiety   . Blurry vision 09/19/2014  . Carpal tunnel syndrome    left  . Cataract   . Chronic headaches   . Complication of anesthesia    "fight when I come out from anesthesia"  . Depression   . Diverticulosis   . Estrogen deficiency 02/05/2018  . GERD (gastroesophageal reflux disease)   . Glaucoma   . HLD (hyperlipidemia)   . Hypertension   . Hypothyroidism   . Infestation by bed bug 07/29/2018   - hydrocodone acetate for itching - provided information on how to treat for bed bugs  - letter provided to help her obtain assistance for moving.   Marland Kitchen PAD (peripheral artery disease) ([place])   . PAD (peripheral artery disease) ([place])   . PUD (peptic ulcer disease)   . TIA (transient ischemic attack)   . Type 2 diabetes  mellitus with complication ([place]) [id number]   Review of Systems:  ***  Physical Exam:  There were no vitals filed for this visit. ***  Assessment & Plan:   See Encounters Tab for problem based charting.  Patient {GC/GE:3044014::"discussed with","seen with"} Dr. {NAMES:[id number]::"Butcher","Guilloud","Hoffman","Mullen","Narendra","Raines","Vincent"}

## 2018-12-07 ENCOUNTER — Encounter: Payer: Medicare Other | Admitting: Internal Medicine

## 2018-12-08 ENCOUNTER — Encounter: Payer: Self-pay | Admitting: *Deleted

## 2018-12-08 ENCOUNTER — Encounter: Payer: Self-pay | Admitting: Internal Medicine

## 2018-12-08 ENCOUNTER — Other Ambulatory Visit: Payer: Self-pay | Admitting: *Deleted

## 2018-12-08 NOTE — Patient Outreach (Signed)
[facility]) Care Management  12/08/2018  CEONA CONTORNO [birth year] [id number]   [place] Monthly Outreach  Referral Date:06/09/2017 Referral Source:Transfer from [place] Reason for Referral:Continued Disease Management Education Insurance:[employer] Medicare  Outreach Attempt:  Successful telephone outreach to patient for follow up.  HIPAA verified with patient.  Patient reporting being a little sad due to having to spend her birthday and holiday alone due to the pandemic.  She does report her mental health is doing much better and feeling less depressed considering.  Continues to monitor blood sugars.  Fasting blood sugar this morning was 107 with ranges of 100-200.  Does endorse some hypoglycemic events related to not eating.  Encouraged patient to eat well balanced meals throughout the day and the importance of preventing hypoglycemic events.  Appointments:  Patient missed appointment with primary care provider on 12/07/2018.  Encouraged her to contact and reschedule as soon as possible.  Plan: RN Health Coach will make next telephone outreach to patient with in the month of December.  [place] [phone] [email or web address]

## 2018-12-16 ENCOUNTER — Other Ambulatory Visit: Payer: Self-pay

## 2018-12-16 DIAGNOSIS — I739 Peripheral vascular disease, unspecified: Secondary | ICD-10-CM

## 2018-12-17 ENCOUNTER — Ambulatory Visit (INDEPENDENT_AMBULATORY_CARE_PROVIDER_SITE_OTHER)
Admit: 2018-12-17 | Discharge: 2018-12-17 | Disposition: A | Discharge status: Home / Self Care | Payer: Medicare Other | Attending: Vascular Surgery | Admitting: Vascular Surgery

## 2018-12-17 ENCOUNTER — Other Ambulatory Visit: Payer: Self-pay

## 2018-12-17 ENCOUNTER — Ambulatory Visit (INDEPENDENT_AMBULATORY_CARE_PROVIDER_SITE_OTHER): Payer: Medicare Other | Admitting: Physician Assistant

## 2018-12-17 ENCOUNTER — Other Ambulatory Visit: Payer: Self-pay | Admitting: *Deleted

## 2018-12-17 ENCOUNTER — Ambulatory Visit (HOSPITAL_COMMUNITY)
Admission: RE | Admit: 2018-12-17 | Discharge: 2018-12-17 | Disposition: A | Discharge status: Home / Self Care | Payer: Medicare Other | Source: Ambulatory Visit | Attending: Vascular Surgery | Admitting: Vascular Surgery

## 2018-12-17 ENCOUNTER — Encounter: Payer: Self-pay | Admitting: *Deleted

## 2018-12-17 VITALS — BP 125/87 | HR 86 | Temp 97.3°F | Resp 16 | Ht 61.5 in | Wt 209.0 lb

## 2018-12-17 DIAGNOSIS — I739 Peripheral vascular disease, unspecified: Secondary | ICD-10-CM | POA: Insufficient documentation

## 2018-12-17 NOTE — Progress Notes (Signed)
Established Intermittent Claudication   History of Present Illness   [name] is a 68 y.o. ([birth year]) female who presents in follow up for peripheral arterial disease.  She had right SFA atherectomy and stenting by Dr. Donzetta Matters 05/2017.  At the time aortogram demonstrated 30% stenosis of right external iliac artery.  She continues to complain of pain in her right hip and thigh especially with walking.  She is then able to rest and the symptoms resolve until she begins walking again.  This is impacting her quality of life and patient states she would like to have this pain addressed because she has no one else to help take care of her.  She denies rest pain or tissue changes of right foot.  Patient also notes that she has hip pain at rest which is made worse with flexion and extension of her hip.  Patient also has known neuropathy of bilateral lower extremities which she is on Neurontin.  She has an allergy to aspirin.  She is taking and statin daily.  Past medical history also significant for diabetes mellitus.  The patient's PMH, PSH, SH, and FamHx were reviewed and are unchanged from prior visit.  Current Outpatient Medications  Medication Sig Dispense Refill  . ACCU-CHEK AVIVA PLUS test strip USE 3 TIMES DAILY TO CHECK BLOOD SUGAR. 275 each 1  . Accu-Chek Softclix Lancets lancets CHECK BLOOD SUGAR UP TO 4 TIMES DAILY 400 each 1  . acetaminophen (TYLENOL) 500 MG tablet Take 2 tablets (1,000 mg total) by mouth every 8 (eight) hours as needed for moderate pain. 90 tablet 1  . Alcohol Swabs (B-D SINGLE USE SWABS REGULAR) PADS USE AS NEEDED AS DIRECTED 200 each 1  . amLODipine (NORVASC) 10 MG tablet TAKE 1 TABLET EVERY DAY (Patient taking differently: Take 10 mg by mouth at bedtime. ) 90 tablet 0  . atorvastatin (LIPITOR) 40 MG tablet Take 1 tablet (40 mg total) by mouth at bedtime. 90 tablet 1  . Blood Glucose Calibration (ACCU-CHEK AVIVA) SOLN     . Blood Glucose Monitoring Suppl  (ACCU-CHEK AVIVA PLUS) w/Device KIT Use to check blood sugar up to 3 times a day 1 kit 0  . cholecalciferol (VITAMIN D3) 25 MCG (1000 UT) tablet Take 1,000 Units by mouth daily.    . clopidogrel (PLAVIX) 75 MG tablet TAKE 1 TABLET DAILY FOR PERIPHERAL ARTERIAL DISEASE (Patient taking differently: Take 75 mg by mouth at bedtime. ) 90 tablet 0  . DULoxetine (CYMBALTA) 20 MG capsule Take 1 capsule (20 mg total) by mouth 2 (two) times daily. 60 capsule 2  . ferrous sulfate 325 (65 FE) MG EC tablet Take 1 tablet (325 mg total) by mouth daily. 90 tablet 3  . fluconazole (DIFLUCAN) 100 MG tablet Take two tablets the first day, and then one tablet every day after until they are gone 7 tablet 0  . furosemide (LASIX) 20 MG tablet Take 1 tablet (20 mg total) by mouth daily. 90 tablet 0  . gabapentin (NEURONTIN) 400 MG capsule TAKE 1 CAPSULE EVERY MORNING, 1 CAPSULE AT LUNCH, AND 2 CAPSULES AT BEDTIME 90 capsule 0  . glucose blood (ACCU-CHEK AVIVA PLUS) test strip Use to check blood sugar 3 times daily. 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 hydrocortisone acetate 0.5 % cream Apply topically 2 (two) times daily. (Patient taking differently: Apply 1 application topically 2 (two) times daily as needed (itching). ) 30 g 0  . levothyroxine (SYNTHROID, LEVOTHROID)  112 MCG tablet Take 1 tablet daily by mouth 1 hour  before breakfast 90 tablet 1  . lisinopril-hydrochlorothiazide (ZESTORETIC) 20-25 MG tablet TAKE 1 TABLET EVERY DAY (Patient taking differently: Take 1 tablet by mouth daily. ) 90 tablet 1  . Melatonin 10 MG CAPS Take 10 mg by mouth at bedtime.    . metFORMIN (GLUMETZA) 1000 MG (MOD) 24 hr tablet Take 1 tablet (1,000 mg total) by mouth daily. 90 tablet 1  . nicotine (NICODERM CQ - DOSED IN MG/24 HOURS) 14 mg/24hr patch Place 1 patch (14 mg total) onto the skin daily. 30 patch 0  . pantoprazole (PROTONIX) 40 MG tablet TAKE 1 TABLET TWICE DAILY (Patient taking differently: Take 40 mg by mouth 2 (two)  times daily. ) 180 tablet 0  . Potassium 99 MG TABS Take 99 mg by mouth daily.    . sitaGLIPtin (JANUVIA) 100 MG tablet TAKE 1 TABLET DAILY 90 tablet 0  . SUMAtriptan (IMITREX) 50 MG tablet Take 1 tablet (50 mg total) by mouth once as needed for migraine. May repeat in 2 hours if headache persists or recurs. 20 tablet 0  . traZODone (DESYREL) 50 MG tablet Take 1 tablet (50 mg total) by mouth at bedtime. 30 tablet 2  . vitamin B-12 (CYANOCOBALAMIN) 1000 MCG tablet Take 1,000 mcg by mouth daily.     No current facility-administered medications for this visit.     On ROS today: 10 system ROS is negative unless otherwise noted in HPI   Physical Examination   Vitals:   12/17/18 1059  BP: 125/87  Pulse: 86  Resp: 16  Temp: (!) 97.3 F (36.3 C)  TempSrc: Temporal  SpO2: 100%  Weight: 209 lb (94.8 kg)  Height: 5' 1.5" (1.562 m)   Body mass index is 38.85 kg/m.  General Alert, O x 3, WD, NAD  Pulmonary Sym exp, good B air movt, CTA B  Cardiac RRR, Nl S1, S2,  Vascular Vessel Right Left  Radial Palpable Palpable  Aorta Not palpable N/A  Femoral Faintly palpable Palpable  Popliteal Not palpable Not palpable  PT Not palpable Not palpable  DP Not palpable Not palpable    Gastro- intestinal soft, non-distended, non-tender to palpation,   Musculo- skeletal M/S 5/5 throughout  , No active tissue ischemia bilateral lower extremities  Neurologic Pain and light touch intact in extremities , Motor exam as listed above    Non-Invasive Vascular imaging   ABI  ABI/TBIToday's ABIToday's TBIPrevious ABIPrevious TBI +-------+-----------+-----------+------------+------------+ Right  0.85       0.64       0.92        0.63         +-------+-----------+-----------+------------+------------+ Left   0.77       0.54       0.82        0.63         +-------+-----------+-----------+------------+------------+    Arterial Duplex (R)  Patent right SFA stent with monophasic  flow throughout right lower extremity   Medical Decision Making   [name] is a 68 y.o. female who presents with:  right leg intermittent claudication of right hip and thigh without evidence of critical limb ischemia.   Patient is without rest pain or tissue ischemia of right lower extremity however does complain of lifestyle limiting claudication of right hip and thigh  Hip pain can also comes on at rest thus we discussed with the patient that right hip and thigh discomfort is likely multifactorial  in nature  Right iliac disease was noted on previous aortogram however was not severe enough for intervention  Plan will be to repeat aortogram with possible right iliac intervention  Discussed risks and benefits of angiography including bleeding, infection, need for additional procedures, no change in symptoms  Patient will be medicated for her mild contrast allergy at the time of procedure    Dagoberto Ligas PA-C [facility] Office: [facility] MD: Donzetta Matters

## 2018-12-20 ENCOUNTER — Other Ambulatory Visit: Payer: Self-pay | Admitting: Internal Medicine

## 2018-12-20 DIAGNOSIS — K219 Gastro-esophageal reflux disease without esophagitis: Secondary | ICD-10-CM

## 2018-12-20 DIAGNOSIS — M5431 Sciatica, right side: Secondary | ICD-10-CM

## 2018-12-20 DIAGNOSIS — M5432 Sciatica, left side: Secondary | ICD-10-CM

## 2018-12-20 NOTE — Telephone Encounter (Signed)
Next appt scheduled 02/17/19 with PCP. 

## 2018-12-20 NOTE — Telephone Encounter (Signed)
Needs refill on gabapentin (NEURONTIN) 400 MG capsule   pantoprazole (PROTONIX) 40 MG tablet     ;pt contact [phone]   [id number] - [place], [place]

## 2018-12-21 ENCOUNTER — Other Ambulatory Visit: Payer: Self-pay | Admitting: *Deleted

## 2018-12-21 NOTE — Patient Outreach (Signed)
[facility]) Care Management  12/21/2018  [name] 1951/01/02 [id number]   RN Health Coach Transportation Question Outreach  Referral Date:06/09/2017 Referral Source:Transfer from [place] Reason for Referral:Continued Disease Management Education Insurance:[employer] Medicare   Outreach Attempt:  Received telephone call from patient asking about previously arranged transportation to pre op appointment.  Attempted return call back, no answer.  HIPAA compliant voice message left.  Also left patient contact information for [place] [phone].  Plan:  RN Health Coach will make previously scheduled outreach attempt within the month of December.  [place] [phone] [name]

## 2018-12-22 ENCOUNTER — Other Ambulatory Visit: Payer: Self-pay | Admitting: *Deleted

## 2018-12-22 MED ORDER — GABAPENTIN 400 MG PO CAPS: ORAL_CAPSULE | ORAL | 0 refills | Status: DC

## 2018-12-22 MED ORDER — PANTOPRAZOLE SODIUM 40 MG PO TBEC: 40.0000 mg | DELAYED_RELEASE_TABLET | Freq: Two times a day (BID) | ORAL | 0 refills | Status: AC

## 2018-12-22 NOTE — Patient Outreach (Signed)
[facility]) Care Management  12/22/2018  [name] Jan 21, 1950 [id number]   CSW spoke with pt and confirmed her identity. CSW introduced self and reason for call. Per pt, she does not need a wheelchair or medical Lucianne Le[place] transport; just a ride to get her pre-op COVID test done.  CSW explored options with her and she indicates she will schedule an [employer] ride. CSW provided contact # to call back if she needs further assistance with ride.   She is hoping to get the test done before she goes out of town for a family death.  CSW offered condolences and support. CSW will update assigned SW, [employer], BSW, upon her return.   Eduard Clos, MSW, [place] Worker  [place] [phone]

## 2018-12-23 ENCOUNTER — Other Ambulatory Visit (HOSPITAL_COMMUNITY): Payer: Medicare Other

## 2018-12-24 ENCOUNTER — Ambulatory Visit: Payer: Medicare Other | Admitting: Podiatry

## 2018-12-28 ENCOUNTER — Other Ambulatory Visit: Payer: Self-pay | Admitting: Internal Medicine

## 2018-12-28 NOTE — Telephone Encounter (Signed)
Called pt - informed she has refills on ferrous sulfate and metformin; she will call the pharmacy.

## 2018-12-28 NOTE — Telephone Encounter (Signed)
Refill Request  metFORMIN (GLUMETZA) 1000 MG (MOD) 24 hr tablet  clopidogrel (PLAVIX) 75 MG tablet  ferrous sulfate 325 (65 FE) MG EC tablet    [id number] - [place], [place]

## 2019-01-04 ENCOUNTER — Encounter: Payer: Self-pay | Admitting: *Deleted

## 2019-01-04 ENCOUNTER — Other Ambulatory Visit: Payer: Self-pay | Admitting: *Deleted

## 2019-01-04 NOTE — Patient Outreach (Signed)
[facility]) Care Management  [place]  01/04/2019   [name] [birth year] [id number]   [place] Monthly Outreach   Referral Date:  06/09/2017 Referral Source:  Transfer from [place]  Reason for Referral:  Continued Disease Management Education Insurance:  [employer]    Outreach Attempt:  Successful telephone outreach to patient for follow up.  HIPAA verified with patient.  Patient reporting she is down and a little sad about having to spend the holidays alone and not being able to see her family due to the pandemic. Encouraged patient to speak with family over the phone as much as possible and offered emotional support.  Continues to monitor blood sugars.  Fasting blood sugar this morning was 105 with fasting ranges recently of 100-120's.  Patient stating she is having pain in her leg/groin area that gets worse with extended periods of ambulation. Reports peripheral angiogram has been rescheduled for next Monday, December 28th.  She needs pre surgery COVID screening done this Saturday, December 26th.  Patient stating she is hoping to find a way there.  Encouraged patient to contact [place] Worker by this Thursday if she is unable to establish a ride.  Encounter Medications:  Outpatient Encounter Medications as of 01/04/2019  Medication Sig Note  . ACCU-CHEK AVIVA PLUS test strip USE 3 TIMES DAILY TO CHECK BLOOD SUGAR.   Marland Kitchen Accu-Chek Softclix Lancets lancets CHECK BLOOD SUGAR UP TO 4 TIMES DAILY   . acetaminophen (TYLENOL) 500 MG tablet Take 2 tablets (1,000 mg total) by mouth every 8 (eight) hours as needed for moderate pain.   . Alcohol Swabs (B-D SINGLE USE SWABS REGULAR) PADS USE AS NEEDED AS DIRECTED   . amLODipine (NORVASC) 10 MG tablet TAKE 1 TABLET EVERY DAY (Patient taking differently: Take 10 mg by mouth at bedtime. )   . atorvastatin (LIPITOR) 40 MG tablet Take 1 tablet (40 mg total) by mouth at bedtime.   .  Blood Glucose Calibration (ACCU-CHEK AVIVA) SOLN    . Blood Glucose Monitoring Suppl (ACCU-CHEK AVIVA PLUS) w/Device KIT Use to check blood sugar up to 3 times a day   . cholecalciferol (VITAMIN D3) 25 MCG (1000 UT) tablet Take 1,000 Units by mouth daily.   . clopidogrel (PLAVIX) 75 MG tablet TAKE 1 TABLET BY MOUTH DAILY (Patient taking differently: Take 75 mg by mouth daily. )   . DULoxetine (CYMBALTA) 20 MG capsule Take 1 capsule (20 mg total) by mouth 2 (two) times daily. (Patient not taking: Reported on 12/31/2018)   . ferrous sulfate 325 (65 FE) MG EC tablet Take 1 tablet (325 mg total) by mouth daily. (Patient taking differently: Take 1 tablet by mouth every other day. )   . furosemide (LASIX) 20 MG tablet Take 1 tablet (20 mg total) by mouth daily.   Marland Kitchen gabapentin (NEURONTIN) 400 MG capsule TAKE 1 CAPSULE EVERY MORNING, 1 CAPSULE AT LUNCH, AND 2 CAPSULES AT BEDTIME (Patient taking differently: Take 400-800 mg by mouth See admin instructions. Take 400 mg in the morning and afternoon and 800 mg at bedtime)   . glucose blood (ACCU-CHEK AVIVA PLUS) test strip Use to check blood sugar 3 times daily. 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 hydrocortisone acetate 0.5 % cream Apply topically 2 (two) times daily. (Patient taking differently: Apply 1 application topically 2 (two) times daily as needed (itching). )   . levothyroxine (SYNTHROID) 125 MCG tablet Take 125 mcg by mouth daily  before breakfast.   . levothyroxine (SYNTHROID, LEVOTHROID) 112 MCG tablet Take 1 tablet daily by mouth 1 hour  before breakfast (Patient not taking: Reported on 12/31/2018)   . lisinopril-hydrochlorothiazide (ZESTORETIC) 20-25 MG tablet TAKE 1 TABLET EVERY DAY (Patient taking differently: Take 1 tablet by mouth daily. )   . Melatonin 10 MG CAPS Take 10 mg by mouth at bedtime.   . metFORMIN (GLUCOPHAGE-XR) 500 MG 24 hr tablet Take 500 mg by mouth 2 (two) times daily.   . metFORMIN (GLUMETZA) 1000 MG (MOD) 24 hr tablet Take 1  tablet (1,000 mg total) by mouth daily. 12/31/2018: Hasnt started   . nicotine (NICODERM CQ - DOSED IN MG/24 HOURS) 14 mg/24hr patch Place 1 patch (14 mg total) onto the skin daily. (Patient not taking: Reported on 12/31/2018)   . pantoprazole (PROTONIX) 40 MG tablet Take 1 tablet (40 mg total) by mouth 2 (two) times daily.   Vladimir Faster Glycol-Propyl Glycol (SYSTANE OP) Place 1 drop into both eyes daily.   . Potassium 99 MG TABS Take 99 mg by mouth daily.   . Simethicone (GAS-X PO) Take 1-2 tablets by mouth 3 (three) times daily with meals.   . sitaGLIPtin (JANUVIA) 100 MG tablet TAKE 1 TABLET DAILY (Patient taking differently: Take 100 mg by mouth daily. )   . SUMAtriptan (IMITREX) 50 MG tablet Take 1 tablet (50 mg total) by mouth once as needed for migraine. May repeat in 2 hours if headache persists or recurs. (Patient not taking: Reported on 12/31/2018)   . traZODone (DESYREL) 50 MG tablet Take 1 tablet (50 mg total) by mouth at bedtime.   . vitamin B-12 (CYANOCOBALAMIN) 1000 MCG tablet Take 1,000 mcg by mouth daily.    No facility-administered encounter medications on file as of 01/04/2019.    Functional Status:  In your present state of health, do you have any difficulty performing the following activities: 10/19/2018 10/12/2018  Hearing? [place]? N -  Difficulty concentrating or making decisions? N -  Comment - -  Walking or climbing stairs? Y -  Comment - -  Dressing or bathing? N -  Comment - -  Doing errands, shopping? [place] and eating ? - -  Comment - -  Using the Toilet? - -  In the past six months, have you accidently leaked urine? - -  Comment - -  Do you have problems with loss of bowel control? - -  Managing your Medications? - -  Managing your Finances? - -  Housekeeping or managing your Housekeeping? - -  Comment - -  Some recent data might be hidden    Fall/Depression Screening: Fall Risk  01/04/2019 12/08/2018  10/19/2018  Falls in the past year? 1 1 1   Comment - - -  Number falls in past yr: 1 1 1   Comment - - -  Injury with Fall? 1 1 1   Comment - - -  Risk Factor Category  High Risk (2 or more Points) High Risk (2 or more Points) -  Risk for fall due to : History of fall(s);Medication side effect;Impaired balance/gait;Impaired mobility;Impaired vision History of fall(s);Medication side effect;Impaired mobility History of fall(s);Impaired balance/gait;Impaired mobility  Risk for fall due to: Comment - - -  Follow up Education provided;Falls evaluation completed;Falls prevention discussed Education provided;Falls prevention discussed;Falls evaluation completed Falls prevention discussed   [facility] 2/9 Scores 10/19/2018 09/21/2018 07/06/2018 02/05/2018 01/25/2018 12/31/2017 12/25/2017  PHQ - 2 Score 3 4 3 2 2 2 2   PHQ- 9 Score 13 19 17 7 9 6 8    THN CM Care Plan Problem One     Most Recent Value  Care Plan Problem One  Knowledge deficit related to self care management of diabetes and depression  Role Documenting the Problem One  [place] for Problem One  Active  [facility] Long Term Goal   Patient will continue to maintain A1C of 7 or below in the next 90 days.  THN Long Term Goal Start Date  01/04/19  Interventions for Problem One Long Term Goal  Reviewed and discussed goals and care plan, reviewed current blood sugar values and congratulated patient for current ranges, encouraged to continue to monitor blood sugars, encouraged to increase exercise as tolerated, discussed transportation options for procedure prescreeniing, encouraged patient to contact [place] if unable to secure transportation for prescreening process, discussed rescheduling missed podiatry appointment, encouraged medication compliance, encouraged to keep and attend scheduled medical appointments, listened and offered patient emotional support  THN CM Short Term Goal #1   Patient will report decreasing cigarrettes to 5 per  day within the next 60 days.  THN CM Short Term Goal #1 Start Date  01/04/19  Interventions for Short Term Goal #1  Smoking cessation discussed and reviewed, continue to encourage smoking cessation with reducing number of cigarrettes daily, discussed importance of smoking cessation     Appointments:   Scheduled for peripheral angiogram on 01/10/2019.  Scheduled for follow up with primary care provider on 02/17/2019.  Plan: RN Health Coach will send primary care provider quarterly update. RN Health Coach will make next telephone outreach to patient within the month of January and patient agreeable to future outreach.  [place] Coach [phone] [email or web address]

## 2019-01-08 ENCOUNTER — Other Ambulatory Visit (HOSPITAL_COMMUNITY): Payer: Medicare Other

## 2019-01-08 ENCOUNTER — Other Ambulatory Visit (HOSPITAL_COMMUNITY): Payer: Self-pay

## 2019-01-09 IMAGING — MG DIGITAL SCREENING BILATERAL MAMMOGRAM WITH TOMO AND CAD
6 of 10 series · 6 of 30 positions shown · non-contrast
Comparison: Previous exam(s).

ACR Breast Density Category a: The breast tissue is almost entirely
fatty.

CLINICAL DATA: Screening.

EXAM:
DIGITAL SCREENING BILATERAL MAMMOGRAM WITH TOMO AND CAD

[L MLO synth-2D (1 of 2)]
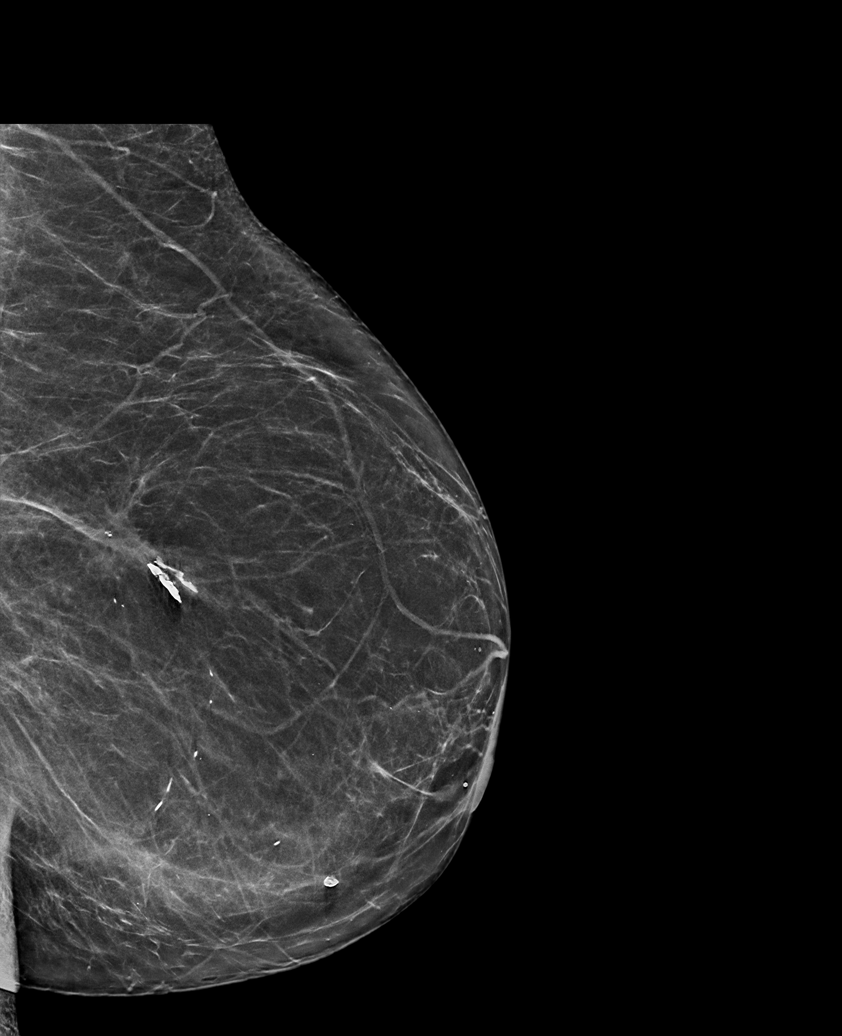

[R MLO synth-2D]
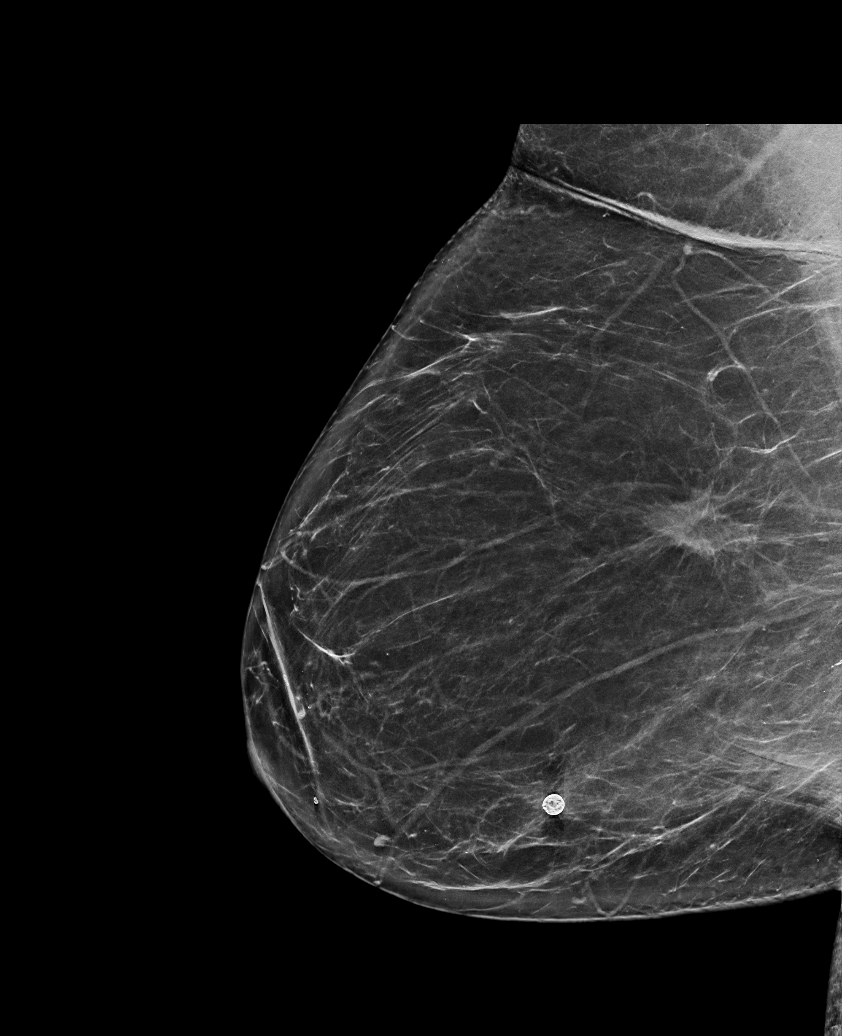

[L CC synth-2D]
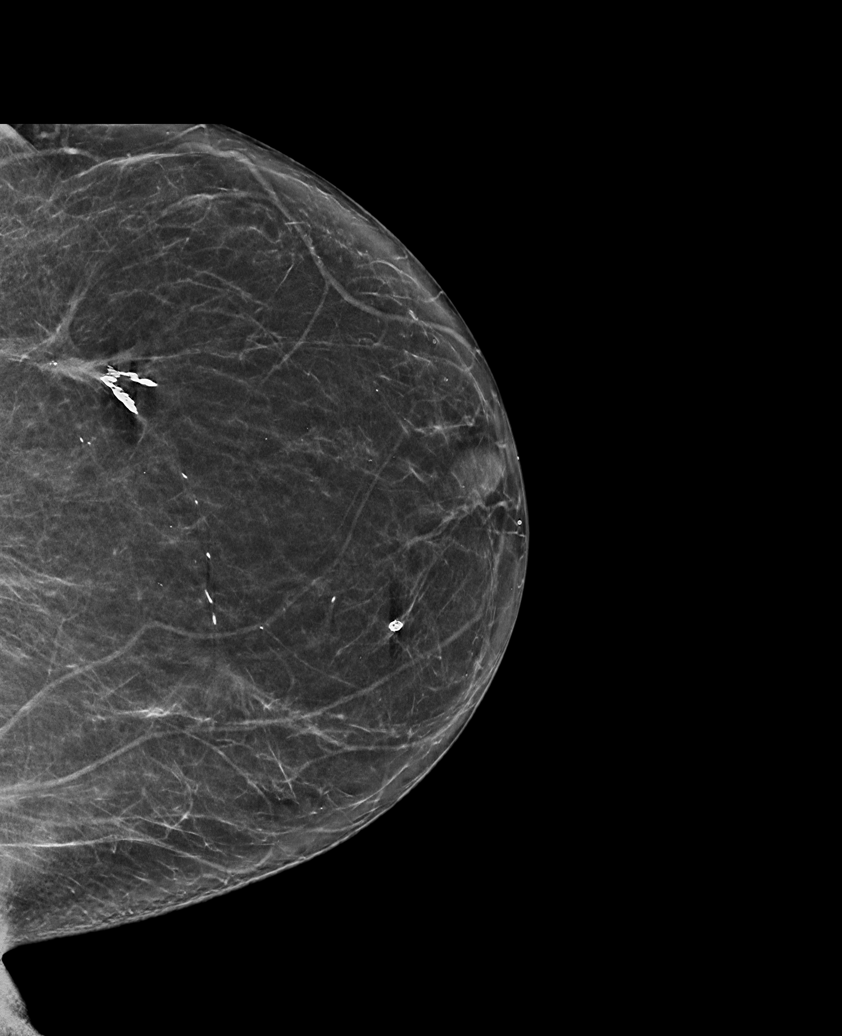

[L MLO synth-2D (2 of 2)]
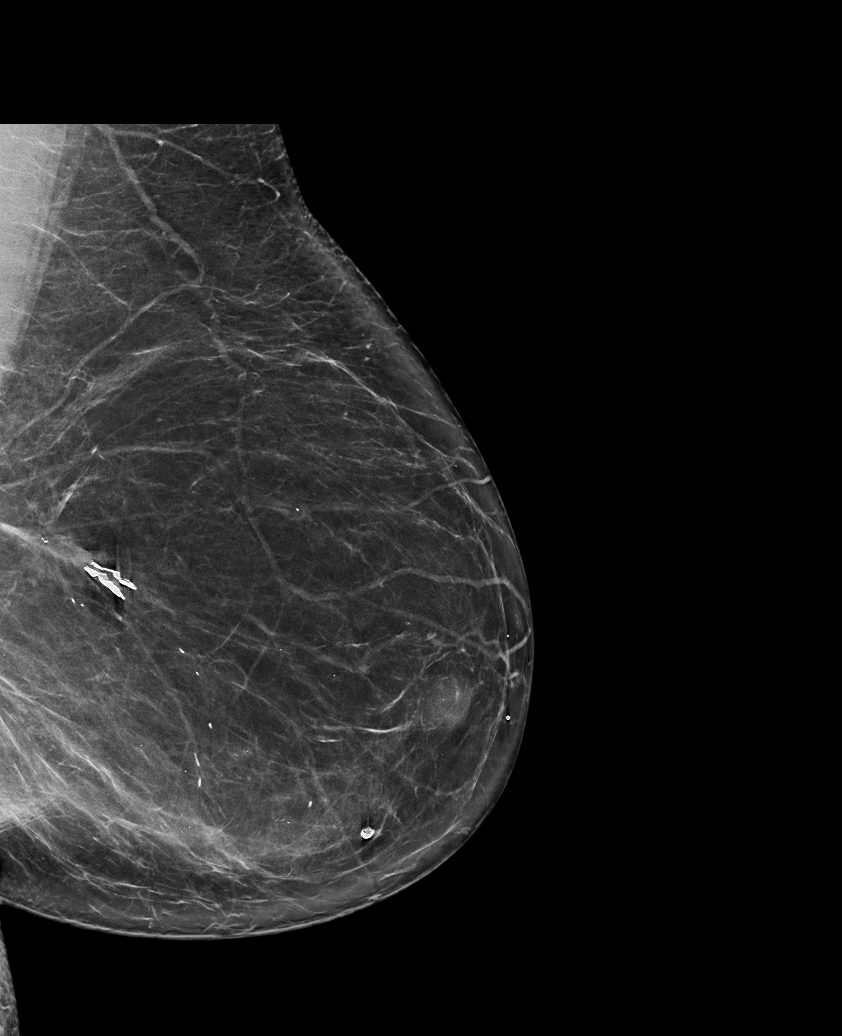

[R CC synth-2D]
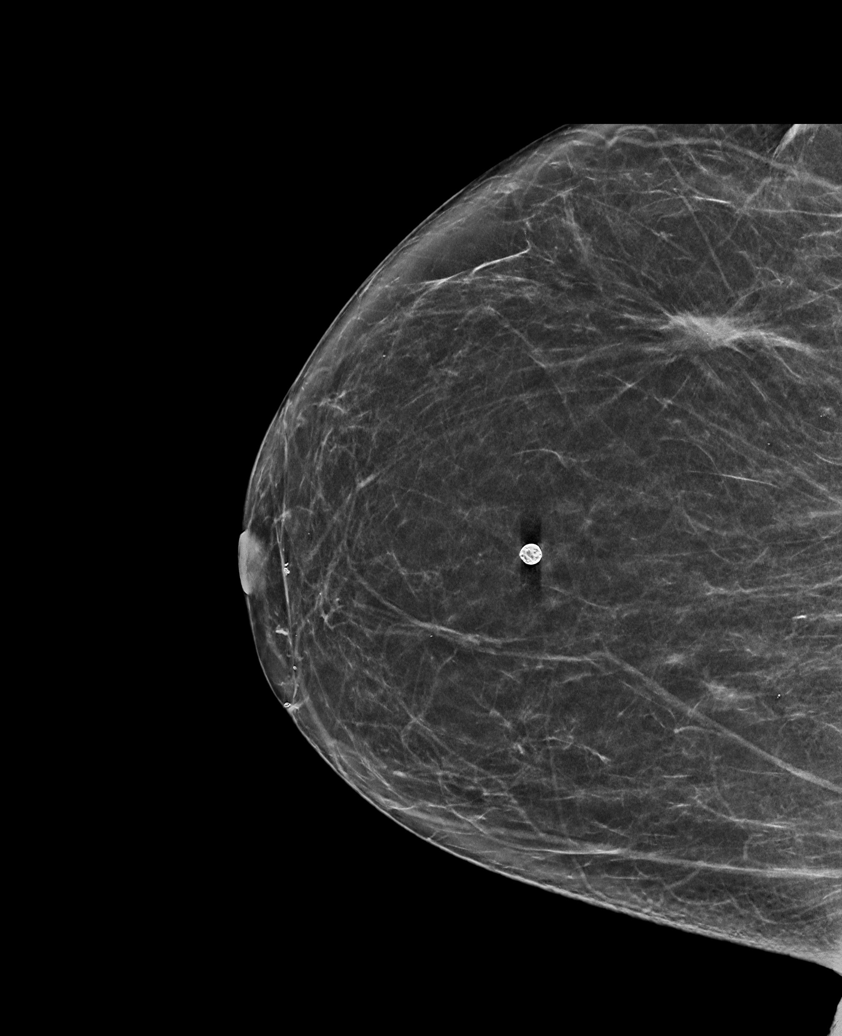

[L MLO tomo · tomo slice 41/81.0]
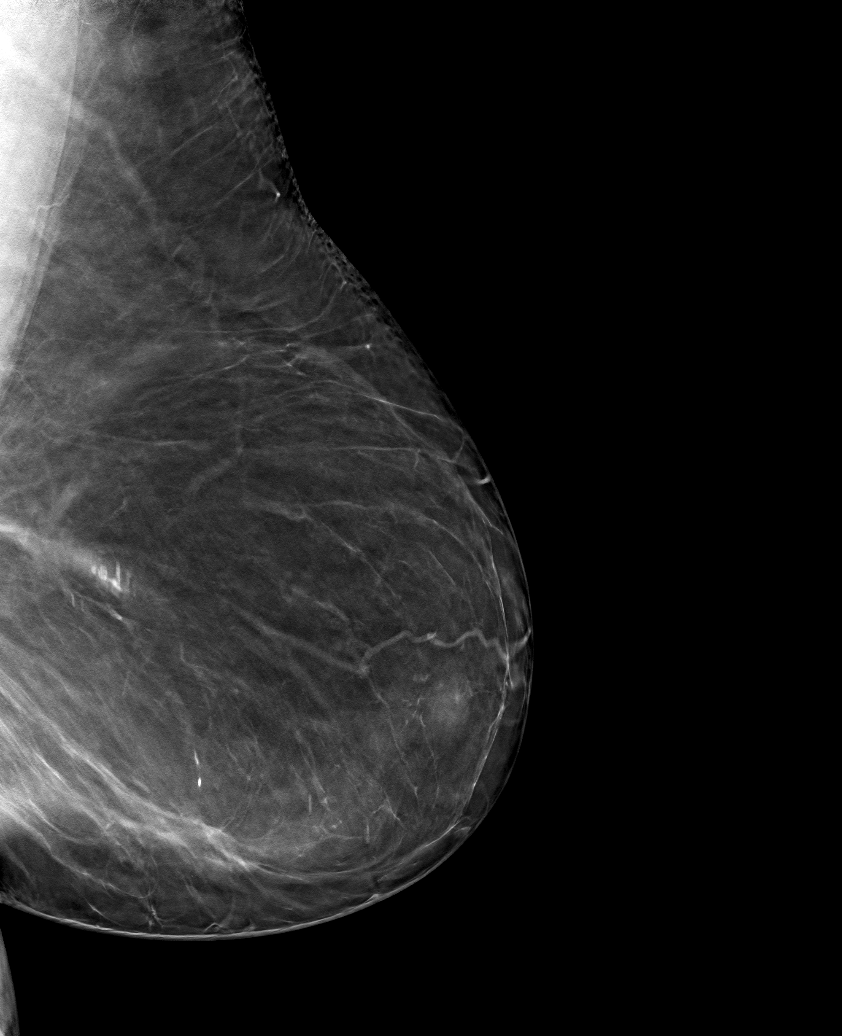

[6 of 30 positions shown; findings below may reference images not displayed]

FINDINGS: There are no findings suspicious for malignancy. Images were
processed with CAD.
IMPRESSION: No mammographic evidence of malignancy. A result letter of this
screening mammogram will be mailed directly to the patient.

RECOMMENDATION:
Screening mammogram in one year. (Code:[id number])

BI-RADS CATEGORY  1: Negative.

## 2019-01-10 ENCOUNTER — Encounter (HOSPITAL_COMMUNITY): Admission: RE | Payer: Self-pay | Source: Home / Self Care

## 2019-01-10 ENCOUNTER — Ambulatory Visit (HOSPITAL_COMMUNITY): Admission: RE | Admit: 2019-01-10 | Payer: Medicare Other | Source: Home / Self Care | Admitting: Vascular Surgery

## 2019-01-10 SURGERY — ABDOMINAL AORTOGRAM W/LOWER EXTREMITY
Anesthesia: LOCAL

## 2019-01-12 ENCOUNTER — Other Ambulatory Visit: Payer: Self-pay | Admitting: Internal Medicine

## 2019-01-12 DIAGNOSIS — M5432 Sciatica, left side: Secondary | ICD-10-CM

## 2019-01-12 DIAGNOSIS — M5431 Sciatica, right side: Secondary | ICD-10-CM

## 2019-01-13 ENCOUNTER — Other Ambulatory Visit: Payer: Self-pay

## 2019-01-17 ENCOUNTER — Other Ambulatory Visit: Payer: Self-pay | Admitting: Internal Medicine

## 2019-01-17 NOTE — Telephone Encounter (Signed)
Refill Request  Pt has contacted her pharmacy and her refill is not there.   gabapentin (NEURONTIN) 400 MG capsule   [id number] - [place], [place]

## 2019-01-17 NOTE — Telephone Encounter (Signed)
Called [facility], talked to [place] - stated just finished filling Gabapentin and is ready. Pt called / informed of refill.

## 2019-01-17 NOTE — Telephone Encounter (Signed)
Called pharmacy and pharmacist states they rec'd script refill 1/3 and it is ready for picup, informed pt

## 2019-01-19 ENCOUNTER — Other Ambulatory Visit: Payer: Self-pay | Admitting: *Deleted

## 2019-01-19 NOTE — Patient Outreach (Signed)
[facility]) Care Management  01/19/2019  [name] [birth year] [id number]   [place] Monthly Outreach  Referral Date:06/09/2017 Referral Source:Transfer from [place] Reason for Referral:Continued Disease Management Education Insurance:[employer]   Outreach Attempt:  Outreach attempt #1 to patient for follow up.  Patient answered and stated she was not at home.  Requested to call this [place] back once she returned home.  No call back from patient at this time.   Plan:  RN Health Coach will make another outreach to patient within the month of January if no return call back from patient.  [place] [phone] [name]

## 2019-01-20 ENCOUNTER — Other Ambulatory Visit: Payer: Self-pay | Admitting: *Deleted

## 2019-01-20 NOTE — Patient Outreach (Signed)
[facility]) Care Management  01/20/2019  [name] [birth year] [id number]   [place] Monthly Outreach  Referral Date:06/09/2017 Referral Source:Transfer from [place] Reason for Referral:Continued Disease Management Education Insurance:[employer] Medicare   Outreach Attempt:  Outreach attempt #2 to patient for follow up.  Patient answered and stated she is out of the home and not able to speak at this time.   Plan:  RN Health Coach will make another outreach attempt within the month of January per patient request.  Hubert Azure RN [place] [phone] [email or web address]

## 2019-01-21 ENCOUNTER — Other Ambulatory Visit: Payer: Self-pay | Admitting: *Deleted

## 2019-01-21 ENCOUNTER — Encounter: Payer: Self-pay | Admitting: *Deleted

## 2019-01-21 NOTE — Patient Outreach (Signed)
[facility]) Care Management  01/21/2019  ROISE LOFINK December 02, 1950 [id number]   [place] Monthly Outreach  Referral Date:06/09/2017 Referral Source:Transfer from [place] Reason for Referral:Continued Disease Management Education Insurance:[employer] Medicare   Outreach Attempt:  Successful telephone outreach to patient for follow up.  HIPAA verified with patient.  Patient reporting she is doing fair, maintaining getting through the holidays.  States she has been trying to stay busy and distracted.  Reports her blood sugars have maintained below 200's.  Fasting blood sugar this morning was 111 with fasting ranges of 90-155.  Reports her angiogram surgery has been rescheduled.  Patient stating her living conditions are much better.  Appointments:  Last attended appointment with primary care provider, [name] on 10/19/2018 and has scheduled follow up on 02/17/2019.  Abdominal Aortogram is scheduled for 02/07/2019.  Plan:  RN Health Coach will make next telephone outreach to patient within the month of February and patient agrees to future outreach.   [place] [phone] [place] [email or web address]

## 2019-02-03 ENCOUNTER — Other Ambulatory Visit: Payer: Self-pay

## 2019-02-03 ENCOUNTER — Ambulatory Visit: Payer: Medicare Other | Admitting: Podiatry

## 2019-02-03 DIAGNOSIS — G5763 Lesion of plantar nerve, bilateral lower limbs: Secondary | ICD-10-CM

## 2019-02-03 NOTE — Progress Notes (Signed)
  Subjective:  Patient ID: [name], female    DOB: May 06, 1950,  MRN: [id number]  Chief Complaint  Patient presents with  . Neuroma    Pt states bilateral neuromas are becoming painful again due to missing some appointments for treatment. Pt states the injections have been effective previously.    69 y.o. female presents with the above complaint. History confirmed with patient. Denies new issues.  Objective:  Physical Exam: warm, good capillary refill, no trophic changes or ulcerative lesions, normal DP and PT pulses and normal sensory exam. Left Foot: tenderness between the 3rd and 4th metatarsal head  Right Foot: tenderness between the 3rd and 4th metatarsal head    Assessment:   1. Morton's metatarsalgia, neuralgia, or neuroma, bilateral     Plan:  Patient was evaluated and treated and all questions answered.  Morton Neuroma -Educated on etiology -Educated on padding and proper shoegear -Injection delivered to the affected interspaces  Procedure: Neuroma Injection Location: Bilateral 3rd interspace Skin Prep: Alcohol. Injectate: 0.5 cc 0.5% marcaine plain, 0.5 cc celestone Disposition: Patient tolerated procedure well. Injection site dressed with a band-aid.  Return in about 3 weeks (around 02/24/2019) for Neuroma, Bilateral.

## 2019-02-04 ENCOUNTER — Other Ambulatory Visit (HOSPITAL_COMMUNITY)
Admission: RE | Admit: 2019-02-04 | Discharge: 2019-02-04 | Disposition: A | Discharge status: Home / Self Care | Payer: Medicare Other | Source: Ambulatory Visit | Attending: Vascular Surgery | Admitting: Vascular Surgery

## 2019-02-04 DIAGNOSIS — Z20822 Contact with and (suspected) exposure to covid-19: Secondary | ICD-10-CM | POA: Insufficient documentation

## 2019-02-04 DIAGNOSIS — Z01812 Encounter for preprocedural laboratory examination: Secondary | ICD-10-CM | POA: Insufficient documentation

## 2019-02-04 LAB — SARS CORONAVIRUS 2 (TAT 6-24 HRS): SARS Coronavirus 2: NEGATIVE

## 2019-02-07 ENCOUNTER — Other Ambulatory Visit: Payer: Self-pay

## 2019-02-07 ENCOUNTER — Encounter (HOSPITAL_COMMUNITY)
Admission: RE | Disposition: A | Discharge status: Home / Self Care | Payer: Self-pay | Source: Home / Self Care | Attending: Vascular Surgery

## 2019-02-07 ENCOUNTER — Ambulatory Visit (HOSPITAL_COMMUNITY)
Admission: RE | Admit: 2019-02-07 | Discharge: 2019-02-07 | Disposition: A | Discharge status: Home / Self Care | Payer: Medicare Other | Attending: Vascular Surgery | Admitting: Vascular Surgery

## 2019-02-07 DIAGNOSIS — Z79899 Other long term (current) drug therapy: Secondary | ICD-10-CM | POA: Insufficient documentation

## 2019-02-07 DIAGNOSIS — Z79890 Hormone replacement therapy (postmenopausal): Secondary | ICD-10-CM | POA: Diagnosis not present

## 2019-02-07 DIAGNOSIS — I70221 Atherosclerosis of native arteries of extremities with rest pain, right leg: Secondary | ICD-10-CM | POA: Diagnosis not present

## 2019-02-07 DIAGNOSIS — G629 Polyneuropathy, unspecified: Secondary | ICD-10-CM | POA: Diagnosis not present

## 2019-02-07 DIAGNOSIS — M25551 Pain in right hip: Secondary | ICD-10-CM | POA: Insufficient documentation

## 2019-02-07 DIAGNOSIS — Z7984 Long term (current) use of oral hypoglycemic drugs: Secondary | ICD-10-CM | POA: Diagnosis not present

## 2019-02-07 DIAGNOSIS — Z95828 Presence of other vascular implants and grafts: Secondary | ICD-10-CM | POA: Diagnosis not present

## 2019-02-07 DIAGNOSIS — Z7902 Long term (current) use of antithrombotics/antiplatelets: Secondary | ICD-10-CM | POA: Insufficient documentation

## 2019-02-07 DIAGNOSIS — M79661 Pain in right lower leg: Secondary | ICD-10-CM | POA: Diagnosis not present

## 2019-02-07 HISTORY — PX: ABDOMINAL AORTOGRAM W/LOWER EXTREMITY: CATH118223

## 2019-02-07 LAB — BASIC METABOLIC PANEL
Anion gap: 13 (ref 5–15)
BUN: 16 mg/dL (ref 8–23)
CO2: 24 mmol/L (ref 22–32)
Calcium: 7.9 mg/dL — ABNORMAL LOW (ref 8.9–10.3)
Chloride: 101 mmol/L (ref 98–111)
Creatinine, Ser: 1.49 mg/dL — ABNORMAL HIGH (ref 0.44–1.00)
GFR calc Af Amer: 41 mL/min — ABNORMAL LOW (ref >60)
GFR calc non Af Amer: 35 mL/min — ABNORMAL LOW (ref >60)
Glucose, Bld: 94 mg/dL (ref 70–99)
Potassium: 4.1 mmol/L (ref 3.5–5.1)
Sodium: 138 mmol/L (ref 135–145)

## 2019-02-07 LAB — GLUCOSE, CAPILLARY
Glucose-Capillary: 127 mg/dL — ABNORMAL HIGH (ref 70–99)
Glucose-Capillary: 209 mg/dL — ABNORMAL HIGH (ref 70–99)

## 2019-02-07 SURGERY — ABDOMINAL AORTOGRAM W/LOWER EXTREMITY
Anesthesia: LOCAL | Laterality: Right

## 2019-02-07 MED ORDER — FENTANYL CITRATE (PF) 100 MCG/2ML IJ SOLN
INTRAMUSCULAR | Status: AC
  Filled 2019-02-07: qty 2

## 2019-02-07 MED ORDER — SODIUM CHLORIDE 0.9% FLUSH: 3.0000 mL | Freq: Two times a day (BID) | INTRAVENOUS | Status: DC

## 2019-02-07 MED ORDER — DIPHENHYDRAMINE HCL 50 MG/ML IJ SOLN
25.0000 mg | Freq: Once | INTRAMUSCULAR | Status: AC
  Administered 2019-02-07: 06:00:00 25 mg via INTRAVENOUS
  Filled 2019-02-07: qty 1

## 2019-02-07 MED ORDER — SODIUM CHLORIDE 0.9 % IV SOLN: INTRAVENOUS | Status: DC

## 2019-02-07 MED ORDER — MIDAZOLAM HCL 2 MG/2ML IJ SOLN
INTRAMUSCULAR | Status: AC
  Filled 2019-02-07: qty 2

## 2019-02-07 MED ORDER — ONDANSETRON HCL 4 MG/2ML IJ SOLN: 4.0000 mg | Freq: Four times a day (QID) | INTRAMUSCULAR | Status: DC | PRN

## 2019-02-07 MED ORDER — FENTANYL CITRATE (PF) 100 MCG/2ML IJ SOLN
INTRAMUSCULAR | Status: DC | PRN
  Administered 2019-02-07: 50 ug via INTRAVENOUS

## 2019-02-07 MED ORDER — SODIUM CHLORIDE 0.9 % WEIGHT BASED INFUSION: 1.0000 mL/kg/h | INTRAVENOUS | Status: DC

## 2019-02-07 MED ORDER — METHYLPREDNISOLONE SODIUM SUCC 125 MG IJ SOLR
125.0000 mg | Freq: Once | INTRAMUSCULAR | Status: AC
  Administered 2019-02-07: 125 mg via INTRAVENOUS
  Filled 2019-02-07: qty 2

## 2019-02-07 MED ORDER — HEPARIN (PORCINE) IN NACL 1000-0.9 UT/500ML-% IV SOLN
INTRAVENOUS | Status: DC | PRN
  Administered 2019-02-07 (×2): 500 mL

## 2019-02-07 MED ORDER — MIDAZOLAM HCL 2 MG/2ML IJ SOLN
INTRAMUSCULAR | Status: DC | PRN
  Administered 2019-02-07: 1 mg via INTRAVENOUS

## 2019-02-07 MED ORDER — LIDOCAINE HCL (PF) 1 % IJ SOLN
INTRAMUSCULAR | Status: DC | PRN
  Administered 2019-02-07: 15 mL

## 2019-02-07 MED ORDER — ACETAMINOPHEN 325 MG PO TABS: 650.0000 mg | ORAL_TABLET | ORAL | Status: DC | PRN

## 2019-02-07 MED ORDER — SODIUM CHLORIDE 0.9% FLUSH: 3.0000 mL | INTRAVENOUS | Status: DC | PRN

## 2019-02-07 MED ORDER — SODIUM CHLORIDE 0.9 % IV SOLN: 250.0000 mL | INTRAVENOUS | Status: DC | PRN

## 2019-02-07 MED ORDER — IODIXANOL 320 MG/ML IV SOLN
INTRAVENOUS | Status: DC | PRN
  Administered 2019-02-07: 20 mL via INTRA_ARTERIAL

## 2019-02-07 MED ORDER — OXYCODONE HCL 5 MG PO TABS: 5.0000 mg | ORAL_TABLET | ORAL | Status: DC | PRN

## 2019-02-07 MED ORDER — HEPARIN (PORCINE) IN NACL 1000-0.9 UT/500ML-% IV SOLN
INTRAVENOUS | Status: AC
  Filled 2019-02-07: qty 1000

## 2019-02-07 MED ORDER — LABETALOL HCL 5 MG/ML IV SOLN: 10.0000 mg | INTRAVENOUS | Status: DC | PRN

## 2019-02-07 MED ORDER — LIDOCAINE HCL (PF) 1 % IJ SOLN
INTRAMUSCULAR | Status: AC
  Filled 2019-02-07: qty 30

## 2019-02-07 MED ORDER — HYDRALAZINE HCL 20 MG/ML IJ SOLN: 5.0000 mg | INTRAMUSCULAR | Status: DC | PRN

## 2019-02-07 MED FILL — Medication: Qty: 1 | Status: AC

## 2019-02-07 SURGICAL SUPPLY — 13 items
CATH OMNI FLUSH 5F 65CM (CATHETERS) ×2 IMPLANT
FILTER CO2 0.2 MICRON (VASCULAR PRODUCTS) ×2 IMPLANT
KIT MICROPUNCTURE NIT STIFF (SHEATH) ×2 IMPLANT
KIT PV (KITS) ×2 IMPLANT
RESERVOIR CO2 (VASCULAR PRODUCTS) ×2 IMPLANT
SET FLUSH CO2 (MISCELLANEOUS) ×2 IMPLANT
SHEATH PINNACLE 5F 10CM (SHEATH) ×2 IMPLANT
SHEATH PROBE COVER 6X72 (BAG) ×2 IMPLANT
SYR MEDRAD MARK V 150ML (SYRINGE) ×2 IMPLANT
TRANSDUCER W/STOPCOCK (MISCELLANEOUS) ×2 IMPLANT
TRAY PV CATH (CUSTOM PROCEDURE TRAY) ×2 IMPLANT
WIRE BENTSON .035X145CM (WIRE) ×2 IMPLANT
WIRE TORQFLEX AUST .018X40CM (WIRE) ×2 IMPLANT

## 2019-02-07 NOTE — Progress Notes (Signed)
Report received from Jenny RN.

## 2019-02-07 NOTE — Progress Notes (Signed)
Client called me to her room and states Dr said I can leave at 4pm; client states abdominal pain has decreased from 10/10 to 5/10 states it "is gas" Dr Donzetta Matters notified of v/s and per Dr Donzetta Matters okay to d/c home now

## 2019-02-07 NOTE — Progress Notes (Addendum)
   I was called to patient bedside status post seizure and patient having generalized abdominal pain.  Her left groin is soft there is no appreciable hematoma.  Her left lower quadrant is soft.  Her blood pressure and heart rate are within normal limits and stable from previous.  I discussed case with on-call neurologist he is reviewed the chart and she has an extensive history of psychogenic nonepileptic seizures and is to follow-up with Dr. Jaynee Eagles as an outpatient.  We will watch her for an additional 2 hours.  [name] C. Donzetta Matters, MD [facility] [place] Office: [phone] Pager: [phone]

## 2019-02-07 NOTE — Significant Event (Signed)
Rapid Response Event Note  Overview: Neurologic - Seizure Activity  Initial Focused Assessment: I was called urgently to Procedural Short Stay to assist with the patient having seizure like activity. Patient is s/p aortogram - LT groin site, she was being discharged, she went the restroom, came out and per staff she was weak, unsteady, and had seizure like activity (per staff her whole body was shaking). When I arrived, there several staff members were present. Patient was awake on the floor, she denied hitting her head and staff safely lowered her to the ground. She was awake, could move her feet and hands, denied neck pain. We assisted her up and had her lay down on a stretcher. HR 90s SBP 140s. Per staff - she had a little hematoma earlier but now she was endorsing severe abdominal pain and radiates into her back. Dr.Cain came to the bedside. Blood sugar 209. Her site looks intact to me but I did not see it prior. Breathing comfortably - 99% oxygen saturation - not in acute distress  She does not seem post ictal - she was awake, follows simple commands, I did not appreciate a gaze however she does not remember what happened. She seems a little agitated and was quite upset with the nurses tending to her.   Interventions: -- NO RRT Interventions   Plan of Care: -- Per Dr. Donzetta Matters  -- Patient has a history of psychogenic seizures per chart  Event Summary:  Call Time 1401 Arrival Time 1402 End Time 1425  Keryl Gholson R

## 2019-02-07 NOTE — H&P (Signed)
History of Present Illness   [name] is a 69 y.o. (1950-08-09) female who presents in follow up for peripheral arterial disease. She had right SFA atherectomy and stenting by Dr. Donzetta Matters 05/2017. At the time aortogram demonstrated 30% stenosis of right external iliac artery. She continues to complain of pain in her right hip and thigh especially with walking. She is then able to rest and the symptoms resolve until she begins walking again. This is impacting her quality of life and patient states she would like to have this pain addressed because she has no one else to help take care of her. She denies rest pain or tissue changes of right foot. Patient also notes that she has hip pain at rest which is made worse with flexion and extension of her hip. Patient also has known neuropathy of bilateral lower extremities which she is on Neurontin. She has an allergy to aspirin. She is taking and statin daily        Current Outpatient Medications  Medication Sig Dispense Refill  . ACCU-CHEK AVIVA PLUS test strip USE 3 TIMES DAILY TO CHECK BLOOD SUGAR. 275 each 1  . Accu-Chek Softclix Lancets lancets CHECK BLOOD SUGAR UP TO 4 TIMES DAILY 400 each 1  . acetaminophen (TYLENOL) 500 MG tablet Take 2 tablets (1,000 mg total) by mouth every 8 (eight) hours as needed for moderate pain. 90 tablet 1  . Alcohol Swabs (B-D SINGLE USE SWABS REGULAR) PADS USE AS NEEDED AS DIRECTED 200 each 1  . amLODipine (NORVASC) 10 MG tablet TAKE 1 TABLET EVERY DAY (Patient taking differently: Take 10 mg by mouth at bedtime. ) 90 tablet 0  . atorvastatin (LIPITOR) 40 MG tablet Take 1 tablet (40 mg total) by mouth at bedtime. 90 tablet 1  . Blood Glucose Calibration (ACCU-CHEK AVIVA) SOLN     . Blood Glucose Monitoring Suppl (ACCU-CHEK AVIVA PLUS) w/Device KIT Use to check blood sugar up to 3 times a day 1 kit 0  . cholecalciferol (VITAMIN D3) 25 MCG (1000 UT) tablet Take 1,000 Units by mouth daily.    . clopidogrel (PLAVIX) 75  MG tablet TAKE 1 TABLET DAILY FOR PERIPHERAL ARTERIAL DISEASE (Patient taking differently: Take 75 mg by mouth at bedtime. ) 90 tablet 0  . DULoxetine (CYMBALTA) 20 MG capsule Take 1 capsule (20 mg total) by mouth 2 (two) times daily. 60 capsule 2  . ferrous sulfate 325 (65 FE) MG EC tablet Take 1 tablet (325 mg total) by mouth daily. 90 tablet 3  . fluconazole (DIFLUCAN) 100 MG tablet Take two tablets the first day, and then one tablet every day after until they are gone 7 tablet 0  . furosemide (LASIX) 20 MG tablet Take 1 tablet (20 mg total) by mouth daily. 90 tablet 0  . gabapentin (NEURONTIN) 400 MG capsule TAKE 1 CAPSULE EVERY MORNING, 1 CAPSULE AT LUNCH, AND 2 CAPSULES AT BEDTIME 90 capsule 0  . glucose blood (ACCU-CHEK AVIVA PLUS) test strip Use to check blood sugar 3 times daily. 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 hydrocortisone acetate 0.5 % cream Apply topically 2 (two) times daily. (Patient taking differently: Apply 1 application topically 2 (two) times daily as needed (itching). ) 30 g 0  . levothyroxine (SYNTHROID, LEVOTHROID) 112 MCG tablet Take 1 tablet daily by mouth 1 hour before breakfast 90 tablet 1  . lisinopril-hydrochlorothiazide (ZESTORETIC) 20-25 MG tablet TAKE 1 TABLET EVERY DAY (Patient taking differently: Take 1 tablet by  mouth daily. ) 90 tablet 1  . Melatonin 10 MG CAPS Take 10 mg by mouth at bedtime.    . metFORMIN (GLUMETZA) 1000 MG (MOD) 24 hr tablet Take 1 tablet (1,000 mg total) by mouth daily. 90 tablet 1  . nicotine (NICODERM CQ - DOSED IN MG/24 HOURS) 14 mg/24hr patch Place 1 patch (14 mg total) onto the skin daily. 30 patch 0  . pantoprazole (PROTONIX) 40 MG tablet TAKE 1 TABLET TWICE DAILY (Patient taking differently: Take 40 mg by mouth 2 (two) times daily. ) 180 tablet 0  . Potassium 99 MG TABS Take 99 mg by mouth daily.    . sitaGLIPtin (JANUVIA) 100 MG tablet TAKE 1 TABLET DAILY 90 tablet 0  . SUMAtriptan (IMITREX) 50 MG tablet Take 1 tablet  (50 mg total) by mouth once as needed for migraine. May repeat in 2 hours if headache persists or recurs. 20 tablet 0  . traZODone (DESYREL) 50 MG tablet Take 1 tablet (50 mg total) by mouth at bedtime. 30 tablet 2  . vitamin B-12 (CYANOCOBALAMIN) 1000 MCG tablet Take 1,000 mcg by mouth daily.     No current facility-administered medications for this visit.    On ROS today: 10 system ROS is negative unless otherwise noted in HPI  Physical Examination       Vitals:   02/07/19 0544  BP: 118/81  Pulse: 88  Resp: 16  Temp: 97.7 F (36.5 C)  SpO2: 99%    Body mass index is 38.85 kg/m.  General Alert, O x 3, WD, NAD  Pulmonary Sym exp, good B air movt, CTA B  Cardiac RRR, Nl S1, S2,  Vascular Vessel Right Left    Radial Palpable Palpable    Aorta Not palpable N/A    Femoral Faintly palpable Palpable    Popliteal Not palpable Not palpable    PT Not palpable Not palpable    DP Not palpable Not palpable   Gastro-  intestinal soft, non-distended, non-tender to palpation,   Musculo-  skeletal M/S 5/5 throughout , No active tissue ischemia bilateral lower extremities  Neurologic Pain and light touch intact in extremities , Motor exam as listed above   Non-Invasive Vascular imaging  ABI  ABI/TBIToday's ABIToday's TBIPrevious ABIPrevious TBI  +-------+-----------+-----------+------------+------------+  Right 0.85 0.64 0.92 0.63   +-------+-----------+-----------+------------+------------+  Left 0.77 0.54 0.82 0.63   +-------+-----------+-----------+------------+------------+  Arterial Duplex (R)  Patent right SFA stent with monophasic flow throughout right lower extremity   A/P  [name] is a 69 y.o. female who presents with: right leg intermittent claudication of right hip and thigh without evidence of critical limb ischemia.  Patient is without rest pain or tissue ischemia of right lower extremity however does complain of lifestyle limiting  claudication of right hip and thigh  Hip pain can also comes on at rest thus we discussed with the patient that right hip and thigh discomfort is likely multifactorial in nature  Right iliac disease was noted on previous aortogram however was not severe enough for intervention  Plan will be to repeat aortogram with possible right iliac intervention  Discussed risks and benefits of angiography including bleeding, infection, need for additional procedures, no change in symptoms  Patient will be medicated for her mild contrast allergy at the time of procedure   Jaleal Schliep C. Donzetta Matters, MD [facility] Office: [phone] Pager: [phone]

## 2019-02-07 NOTE — Progress Notes (Signed)
Site area: left groin fa sheath Site Prior to Removal:  Level 0 Pressure Applied For: 20 minutes Manual:   yes Patient Status During Pull:  stable Post Pull Site:  Level 0 Post Pull Instructions Given:  yes Post Pull Pulses Present: left dp dopplered post sheath pull Dressing Applied:  Gauze and tegaderm Bedrest begins @ [id number] Comments:

## 2019-02-07 NOTE — Op Note (Signed)
    Patient name: [name] MRN: [id number] DOB: [birth year] Sex: female  02/07/2019 Pre-operative Diagnosis: Right lower extremity pain Post-operative diagnosis:  Same Surgeon:  Erlene Quan C. Donzetta Matters, MD Procedure Performed: 1.  Ultrasound-guided cannulation left common femoral artery 2.  CO2 and contrasted angiogram 3.  Selection of right common femoral artery and right lower extremity angiogram 4.  Moderate sedation with fentanyl and Versed for 22 minutes   Indications: 69 year old female has undergone right SFA stenting in the past.  She now has recurrent pain from her right hip down and also has monophasic signals throughout.  She is now indicated for angiography.  Findings: There is a left common iliac artery stent that is patent.  A aorta and right common iliac and external iliac arteries are diseased but there is no more than 50% stenosis.  The SFA stent proximally is patent.  She appears to have three-vessel runoff distally without any flow-limiting stenosis throughout the right lower extremity.   Procedure:  The patient was identified in the holding area and taken to room 8.  The patient was then placed supine on the table and prepped and draped in the usual sterile fashion.  A time out was called.  Ultrasound was used to evaluate the left common femoral artery.  This was noted to be heavily diseased.  The area was anesthetized 1% lidocaine cannulated with direct ultrasound visualization a micropuncture needle followed by wire sheath.  And images saved the permanent record.  A Bentson wire was placed followed by 5 [place] sheath.  Omni catheter was placed to level L1 CO2 aortogram was performed.  We pulled to the bifurcation perform contrasted CO2 pelvic angiography.  We then selected the right common femoral artery with Bentson wire and Omni catheter perform right lower extremity angiography with CO2.  We then visualized the stent with contrast as well as the common external iliac arteries  on the right with contrast.  There was no indication for intervention.  Catheter was removed over wire.  Sheath will be pulled in postoperative holding.  She tolerated procedure well any complication.  Contrast: 20cc  Katherine Syme C. Donzetta Matters, MD [facility] Office: [phone] Pager: [phone]

## 2019-02-07 NOTE — Discharge Instructions (Signed)

## 2019-02-07 NOTE — Progress Notes (Addendum)
Patient was c/o lots of gas and needed to go to bathroom. Ambulated patient to bathroom once bedrest complete. Patient c/o of pain to abdomen and bloating.   Left groin is CDI. Once patient came out of bathroom she looked weak and unsteady.  Got patient to nearby wheelchair. Patient's head went back and she started to have seizure like  activity (head, arms, and legs moving ), patient was eased to floor with assist and placed head on pillow.  Immediately called rapid response and got patient placed on monitor / pads applied.  Rapid response, anesthesia, Dr. Donzetta Matters all came to assess patient. Patient came to pretty quick and asked what happened. Patient placed  Back on bed in room.  Dr. Donzetta Matters assessed patient's groin and abdomen and was aware patient was c/o abdominal pain. Per Dr. Donzetta Matters there is no hematoma or evident bleeding.   Dr. Donzetta Matters evaluated patient's  history and called Dr. Dorene Grebe (neuro doctor on call for Dr. Lavell Anchors).  Dr. 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 Patient has previously refused services (psych consult). Dr. Dorene Grebe stated he would not see patient for this and we should watch patient for 2 hours then discharge if no more problems.

## 2019-02-08 LAB — POCT I-STAT, CHEM 8
BUN: 26 mg/dL — ABNORMAL HIGH (ref 8–23)
Calcium, Ion: 0.88 mmol/L — CL (ref 1.15–1.40)
Chloride: 103 mmol/L (ref 98–111)
Creatinine, Ser: 1.5 mg/dL — ABNORMAL HIGH (ref 0.44–1.00)
Glucose, Bld: 99 mg/dL (ref 70–99)
HCT: 37 % (ref 36.0–46.0)
Hemoglobin: 12.6 g/dL (ref 12.0–15.0)
Potassium: 7.2 mmol/L (ref 3.5–5.1)
Sodium: 136 mmol/L (ref 135–145)
TCO2: 31 mmol/L (ref 22–32)

## 2019-02-12 NOTE — Progress Notes (Signed)
   CC: Diabetes follow up  HPI:  [name] is a 69 y.o. female with diabetes mellitus type 2, pad, chronic hypertension, avm of small bowel, mdd, psychogenic nonepileptic seizure who presented for dm follow up. Please see problem based charting for evaluation, assessment, and plan.  Past Medical History:  Diagnosis Date  . Allergy   . Anal fissure   . Anemia   . Anxiety   . Blurry vision 09/19/2014  . Carpal tunnel syndrome    left  . Cataract   . Chronic headaches   . Complication of anesthesia    "fight when I come out from anesthesia"  . Depression   . Diverticulosis   . Estrogen deficiency 02/05/2018  . GERD (gastroesophageal reflux disease)   . Glaucoma   . HLD (hyperlipidemia)   . Hypertension   . Hypothyroidism   . Infestation by bed bug 07/29/2018   - hydrocodone acetate for itching - provided information on how to treat for bed bugs  - letter provided to help her obtain assistance for moving.   Marland Kitchen PAD (peripheral artery disease) ([place])   . PAD (peripheral artery disease) ([place])   . PUD (peptic ulcer disease)   . TIA (transient ischemic attack)   . Type 2 diabetes mellitus with complication ([place]) [id number]   Review of Systems:    Denies any cough, sneezing, chest pain, body aches   Physical Exam:  Vitals:   02/17/19 1356 02/17/19 1358  BP:  122/86  Pulse:  86  Temp:  98.6 F (37 C)  TempSrc:  Oral  SpO2:  100%  Weight:  200 lb 3.2 oz (90.8 kg)  Height: 5\' 1"  (1.549 m) 5\' 1"  (1.549 m)   Physical Exam  Constitutional: She is oriented to person, place, and time. She appears well-developed and well-nourished. No distress.  HENT:  Head: Normocephalic and atraumatic.  Eyes: Conjunctivae are normal.  Cardiovascular: Normal rate, regular rhythm and normal heart sounds.  Respiratory: Effort normal and breath sounds normal. No respiratory distress. She has no wheezes.  GI: Soft. Bowel sounds are normal. She exhibits no distension. There is no abdominal  tenderness.  Musculoskeletal:        General: No edema.  Neurological: She is alert and oriented to person, place, and time. No cranial nerve deficit.  Skin: She is not diaphoretic.  Psychiatric: She has a normal mood and affect. Her speech is normal. Thought content normal. She is slowed. Cognition and memory are normal.  tuning fork exam normal on bilateral feet   Assessment & Plan:   See Encounters Tab for problem based charting.  Patient discussed with Dr. Philipp Ovens

## 2019-02-17 ENCOUNTER — Encounter: Payer: Self-pay | Admitting: Internal Medicine

## 2019-02-17 ENCOUNTER — Other Ambulatory Visit: Payer: Self-pay

## 2019-02-17 ENCOUNTER — Ambulatory Visit (INDEPENDENT_AMBULATORY_CARE_PROVIDER_SITE_OTHER): Payer: Medicare Other | Admitting: Internal Medicine

## 2019-02-17 VITALS — BP 122/86 | HR 86 | Temp 98.6°F | Ht 61.0 in | Wt 200.2 lb

## 2019-02-17 DIAGNOSIS — I10 Essential (primary) hypertension: Secondary | ICD-10-CM | POA: Diagnosis not present

## 2019-02-17 DIAGNOSIS — Z79899 Other long term (current) drug therapy: Secondary | ICD-10-CM

## 2019-02-17 DIAGNOSIS — Z7984 Long term (current) use of oral hypoglycemic drugs: Secondary | ICD-10-CM

## 2019-02-17 DIAGNOSIS — E1151 Type 2 diabetes mellitus with diabetic peripheral angiopathy without gangrene: Secondary | ICD-10-CM

## 2019-02-17 DIAGNOSIS — E669 Obesity, unspecified: Secondary | ICD-10-CM

## 2019-02-17 DIAGNOSIS — F332 Major depressive disorder, recurrent severe without psychotic features: Secondary | ICD-10-CM

## 2019-02-17 DIAGNOSIS — R519 Headache, unspecified: Secondary | ICD-10-CM

## 2019-02-17 DIAGNOSIS — G4089 Other seizures: Secondary | ICD-10-CM

## 2019-02-17 DIAGNOSIS — E1169 Type 2 diabetes mellitus with other specified complication: Secondary | ICD-10-CM

## 2019-02-17 DIAGNOSIS — K5520 Angiodysplasia of colon without hemorrhage: Secondary | ICD-10-CM | POA: Diagnosis not present

## 2019-02-17 DIAGNOSIS — M5431 Sciatica, right side: Secondary | ICD-10-CM

## 2019-02-17 DIAGNOSIS — M5432 Sciatica, left side: Secondary | ICD-10-CM

## 2019-02-17 DIAGNOSIS — E039 Hypothyroidism, unspecified: Secondary | ICD-10-CM

## 2019-02-17 LAB — POCT GLYCOSYLATED HEMOGLOBIN (HGB A1C): Hemoglobin A1C: 6.1 % — AB (ref 4.0–5.6)

## 2019-02-17 LAB — GLUCOSE, CAPILLARY: Glucose-Capillary: 105 mg/dL — ABNORMAL HIGH (ref 70–99)

## 2019-02-17 MED ORDER — AMLODIPINE BESYLATE 10 MG PO TABS: 10.0000 mg | ORAL_TABLET | Freq: Every day | ORAL | 0 refills | Status: DC

## 2019-02-17 MED ORDER — TRAZODONE HCL 50 MG PO TABS: 50.0000 mg | ORAL_TABLET | Freq: Every day | ORAL | 0 refills | Status: DC

## 2019-02-17 MED ORDER — GABAPENTIN 400 MG PO CAPS: ORAL_CAPSULE | ORAL | 0 refills | Status: DC

## 2019-02-17 MED ORDER — LISINOPRIL-HYDROCHLOROTHIAZIDE 20-25 MG PO TABS: 1.0000 | ORAL_TABLET | Freq: Every day | ORAL | 0 refills | Status: DC

## 2019-02-17 MED ORDER — DULOXETINE HCL 20 MG PO CPEP: 20.0000 mg | ORAL_CAPSULE | Freq: Two times a day (BID) | ORAL | 0 refills | Status: AC

## 2019-02-17 MED ORDER — METFORMIN HCL ER 500 MG PO TB24: 500.0000 mg | ORAL_TABLET | Freq: Two times a day (BID) | ORAL | 0 refills | Status: DC

## 2019-02-17 MED ORDER — SITAGLIPTIN PHOSPHATE 100 MG PO TABS: ORAL_TABLET | ORAL | 0 refills | Status: DC

## 2019-02-17 NOTE — Patient Instructions (Addendum)
It was a pleasure to see you today [name]. Please make the following changes:  -please continue to take the medications indicated on this form -please limit the amount of tylenol you are taking for your headaches to no more than 2 per day  -please follow up with neurology -please follow up in 3 months  If you have any questions or concerns, please call our clinic at [phone] between 9am-5pm and after hours call [phone] and ask for the internal medicine resident on call. If you feel you are having a medical emergency please call 911.   Thank you, we look forward to help you remain healthy!  Lars Mage, MD Internal Medicine PGY3  To schedule an appointment for a COVID vaccine or be added to the vaccine wait list: Go to [email or web address]   OR Go to [email or web address]                  OR Call [phone]                                     OR Call [phone] and select Option 2

## 2019-02-17 NOTE — Assessment & Plan Note (Signed)
The patient's last a1c=6.1 on 02/17/19. The patient's home  blood glucose measurements over the past month have been within normal range.   The patient is currently taking metformin 1000mg  qd and sitagliptin 100mg  qd. The patient is compliant with medication.    Assessment and plan  Patient's blood glucose is well controlled. Continue current regimen.

## 2019-02-17 NOTE — Assessment & Plan Note (Signed)
Patient's phq9 score is 6. She appears well. States that she is planning to move closer to her children in the [place] area.   -continue trazodone 50mg  qhs and duloxetine 20mg  bid

## 2019-02-17 NOTE — Assessment & Plan Note (Signed)
The patient's blood pressure during this visit was 122/86. The patient is currently taking amlodipine 10mg  qd and lisinopril-hctz 20-25mg  qd. Her last blood pressure visits are   BP Readings from Last 3 Encounters:  02/17/19 122/86  02/07/19 128/81  12/17/18 125/87    The patient does not report palpitations, dizziness, chest pain, sob. Assessment and Plan Blood pressure under normal range. Continue current regimen

## 2019-02-17 NOTE — Progress Notes (Signed)
Internal Medicine Clinic Attending  Case discussed with Dr. Chundi at the time of the visit.  We reviewed the resident's history and exam and pertinent patient test results.  I agree with the assessment, diagnosis, and plan of care documented in the resident's note. 

## 2019-02-17 NOTE — Assessment & Plan Note (Addendum)
Patient states that she has been having headaches for the past one month that occur daily and are constantly present. The headache is present in frontal region extending to the occipital. She has been taking eight tylenol tablets daily.   Review of history shows that the patient has a history of headaches in the past notably in 2016 for which she was being treated with flexeril and tylenol prn. She had mri and mra which showed mild scattered periventricular subcortical foci of nonspecific t2 hyperintensities with normal mra.   CN 2-12 intact and no focal weakness.   Assessment and plan  Suspect patient may have tension headaches vs medication overuse headache. She is on numerous medications -cymbalta, gabapentin, trazodone which should subside the headache. She has furthermore been using upto 8 tablets of tylenol daily which can be dangerous.   Will refer to neurology for further workup of headache and assistance with management as patient is on numerous centrally acting agents and she has ckd.

## 2019-02-18 ENCOUNTER — Other Ambulatory Visit: Payer: Self-pay | Admitting: *Deleted

## 2019-02-18 ENCOUNTER — Encounter: Payer: Self-pay | Admitting: *Deleted

## 2019-02-18 LAB — TSH: TSH: 0.207 u[IU]/mL — ABNORMAL LOW (ref 0.450–4.500)

## 2019-02-18 NOTE — Patient Outreach (Signed)
[facility]) Care Management  02/18/2019  DENIJAH WELLS Oct 10, 1950 [id number]   [place] Monthly Outreach  Referral Date:06/09/2017 Referral Source:Transfer from [place] Reason for Referral:Continued Disease Management Education Insurance:[employer] Medicare   Outreach Attempt:  Successful telephone outreach to patient for follow up.  HIPAA verified with patient.  Patient reporting she is moving closer to her family in [place], [place] next Saturday.  States she was initially nervous about the move but now is happy to be closer to family.  Fasting blood sugar this morning was 113 with fasting ranges of 90-110's.  Denies any recent hypoglycemic events.  Latest Hgb A1C was 6.1.  Reports she will continue to follow up with providers her until summer time.  States she has not had any issues from her procedure last month (angiogram).  Appointments:  Last attended appointment with primary care provider, [name] 02/17/2019 and has follow up appointment on 05/26/2019.  Plan: RN Health Coach will make next telephone outreach to patient within the month of March and patient agrees to future outreaches.  [place] [phone] [email or web address]

## 2019-02-24 ENCOUNTER — Ambulatory Visit: Payer: Medicare Other | Admitting: Podiatry

## 2019-02-28 ENCOUNTER — Other Ambulatory Visit: Payer: Self-pay | Admitting: Internal Medicine

## 2019-02-28 NOTE — Telephone Encounter (Signed)
Refill Request  clopidogrel (PLAVIX) 75 MG tablet  ferrous sulfate 325 (65 FE) MG EC tablet   New Pharmacy Listed below:  [facility]  [address], [place], [place] [address]  ~73.2 mi  [phone]

## 2019-03-02 MED ORDER — FERROUS SULFATE 325 (65 FE) MG PO TBEC: 325.0000 mg | DELAYED_RELEASE_TABLET | ORAL | 0 refills | Status: AC

## 2019-03-02 MED ORDER — CLOPIDOGREL BISULFATE 75 MG PO TABS: 75.0000 mg | ORAL_TABLET | Freq: Every day | ORAL | 1 refills | Status: DC

## 2019-03-15 ENCOUNTER — Other Ambulatory Visit: Payer: Self-pay | Admitting: Internal Medicine

## 2019-03-15 DIAGNOSIS — F332 Major depressive disorder, recurrent severe without psychotic features: Secondary | ICD-10-CM

## 2019-03-15 DIAGNOSIS — M5431 Sciatica, right side: Secondary | ICD-10-CM

## 2019-03-15 MED ORDER — TRAZODONE HCL 50 MG PO TABS: 50.0000 mg | ORAL_TABLET | Freq: Every day | ORAL | 0 refills | Status: DC

## 2019-03-15 MED ORDER — GABAPENTIN 400 MG PO CAPS: ORAL_CAPSULE | ORAL | 0 refills | Status: DC

## 2019-03-15 NOTE — Telephone Encounter (Signed)
Need refill on traZODone (DESYREL) 50 MG tablet gabapentin (NEURONTIN) 400 MG capsule  ;pt contact  [phone]  [facility] [address] [place] [address]

## 2019-04-05 ENCOUNTER — Encounter: Payer: Self-pay | Admitting: *Deleted

## 2019-04-05 ENCOUNTER — Other Ambulatory Visit: Payer: Self-pay | Admitting: *Deleted

## 2019-04-05 NOTE — Patient Outreach (Signed)
[facility]) Care Management  [place]  04/05/2019   [name] 19-May-1950 [id number]   [place] Monthly Outreach   Referral Date:  06/09/2017 Referral Source:  Transfer from [place]  Reason for Referral:  Continued Disease Management Education Insurance:  [employer]   Outreach Attempt:  Successful telephone outreach to patient for follow up.  HIPAA verified with patient.  Patient reporting she has moved to [place] and is settling in well.  States it feels good to be closer to her family.  Reports she continues to have feelings of depression but she is able to better handle it.  Does feel tired but relates this to time change and not sleeping much at night.  Continues to smoke, smoking cessation reviewed and encouraged.  Continues to monitor blood sugars.  Fasting blood sugar this morning was 98 with recent fasting ranges of 87-153.  Denies any recent falls.  Encounter Medications:  Outpatient Encounter Medications as of 04/05/2019  Medication Sig Note  . amLODipine (NORVASC) 10 MG tablet Take 1 tablet (10 mg total) by mouth daily.   . cholecalciferol (VITAMIN D3) 25 MCG (1000 UT) tablet Take 1,000 Units by mouth daily.   . clopidogrel (PLAVIX) 75 MG tablet Take 1 tablet (75 mg total) by mouth daily.   . DULoxetine (CYMBALTA) 20 MG capsule Take 1 capsule (20 mg total) by mouth 2 (two) times daily.   . ferrous sulfate 325 (65 FE) MG EC tablet Take 1 tablet (325 mg total) by mouth every other day.   . gabapentin (NEURONTIN) 400 MG capsule TAKE 1 CAPSULE BY MOUTH EVERY MORNING, ONE AT LUNCH, AT BEDTIME TAKE 2 CAPSULES   . levothyroxine (SYNTHROID) 125 MCG tablet Take 125 mcg by mouth daily before breakfast.   . lisinopril-hydrochlorothiazide (ZESTORETIC) 20-25 MG tablet Take 1 tablet by mouth daily.   . Melatonin 10 MG CAPS Take 10 mg by mouth at bedtime.   . metFORMIN (GLUCOPHAGE-XR) 500 MG 24 hr tablet Take 1 tablet  (500 mg total) by mouth 2 (two) times daily.   . pantoprazole (PROTONIX) 40 MG tablet Take 1 tablet (40 mg total) by mouth 2 (two) times daily.   . Potassium 99 MG TABS Take 99 mg by mouth daily.   . sitaGLIPtin (JANUVIA) 100 MG tablet TAKE 1 TABLET DAILY   . traZODone (DESYREL) 50 MG tablet Take 1 tablet (50 mg total) by mouth at bedtime.   Marland Kitchen ACCU-CHEK AVIVA PLUS test strip USE 3 TIMES DAILY TO CHECK BLOOD SUGAR.   Marland Kitchen Accu-Chek Softclix Lancets lancets CHECK BLOOD SUGAR UP TO 4 TIMES DAILY   . Alcohol Swabs (B-D SINGLE USE SWABS REGULAR) PADS USE AS NEEDED AS DIRECTED   . atorvastatin (LIPITOR) 40 MG tablet Take 1 tablet (40 mg total) by mouth at bedtime. 02/18/2019: Continues to take  . Blood Glucose Calibration (ACCU-CHEK AVIVA) SOLN    . Blood Glucose Monitoring Suppl (ACCU-CHEK AVIVA PLUS) w/Device KIT Use to check blood sugar up to 3 times a day   . furosemide (LASIX) 20 MG tablet Take 1 tablet (20 mg total) by mouth daily. 02/18/2019: Continues to take every other day  . glucose blood (ACCU-CHEK AVIVA PLUS) test strip Use to check blood sugar 3 times daily. 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 hydrocortisone acetate 0.5 % cream Apply topically 2 (two) times daily. (Patient taking differently: Apply 1 application topically 2 (two) times daily as needed (itching). )   .  nicotine (NICODERM CQ - DOSED IN MG/24 HOURS) 14 mg/24hr patch Place 1 patch (14 mg total) onto the skin daily. (Patient not taking: Reported on 02/18/2019)   . Polyethyl Glycol-Propyl Glycol (SYSTANE OP) Place 1 drop into both eyes daily as needed (Dry eyes).    . simethicone (GAS-X) 80 MG chewable tablet Chew 2 tablets by mouth 3 (three) times daily with meals.     No facility-administered encounter medications on file as of 04/05/2019.    Functional Status:  In your present state of health, do you have any difficulty performing the following activities: 02/17/2019 10/19/2018  Hearing? Y Y  Comment - -  Vision? Y N  Difficulty  concentrating or making decisions? N N  Walking or climbing stairs? Y Y  Dressing or bathing? N N  Doing errands, shopping? Y Y  Some recent data might be hidden    Fall/Depression Screening: Fall Risk  04/05/2019 02/18/2019 02/17/2019  Falls in the past year? _0 Comment - - -  Number falls in past yr: 0 0 0  Comment - - -  Injury with Fall? 0 0 0  Comment - - -  Risk Factor Category  Moderate Risk (1 Point) Moderate Risk (1 Point) -  Risk for fall due to : History of fall(s);Impaired mobility;Impaired balance/gait;Medication side effect History of fall(s);Medication side effect;Impaired mobility;Impaired balance/gait Impaired balance/gait  Risk for fall due to: Comment - - -  Follow up Falls evaluation completed;Education provided;Falls prevention discussed Falls evaluation completed;Education provided;Falls prevention discussed Falls prevention discussed   PHQ 2/9 Scores 02/17/2019 10/19/2018 09/21/2018 07/06/2018 02/05/2018 01/25/2018 12/31/2017  PHQ - 2 Score _1 PHQ- 9 Score _2 THN CM Care Plan Problem One     Most Recent Value  Care Plan Problem One  Knowledge deficit related to self care management of diabetes and depression  Role Documenting the Problem One  [place] for Problem One  Active  THN Long Term Goal   Patient will report maintaining Hgb A1C of below 7 in the next 90 days.  THN Long Term Goal Start Date  02/18/19  Interventions for Problem One Long Term Goal  Care plan and goals reviewed and discussed with patient, reviewed medications and encouraged medication compliance, reviewed current blood sugar readings and discussed ways to help prevent hypo and hyperglycemia, encouraged patient to eat atleast 3 well balanced meals throughout the day, reviewed upcoming medical appiontments with patient and encouraged attendance  [facility] CM Short Term Goal #1   Patient will report decreasing cigarrettes to 5 per day within the next 60 days.  THN  CM Short Term Goal #1 Start Date  02/18/19  Interventions for Short Term Goal #1  Discussed dangers of smoking, encouraged smoking cessation, discussed reducing numbers of cigarrettes to help wean off, encouraged to discuss smoking cessation options with providers  [facility] CM Short Term Goal #2   Patient will report obtaining list of eye providers in her area from [place] within the next 60 days.  THN CM Short Term Goal #2 Start Date  04/05/19  Interventions for Short Term Goal #2  Verified patient has not seen eye provider in the past few years, discussed importance of yearly eye exams for diabetics, encouraged patient to contact [place] to obtain list of in network eye providers in the area, encouraged patient to schedule eye appointment as soon as she  can     Appointments:  Patient last saw primary care provider, [name] on 02/17/19 and has upcoming appointment scheduled for 05/26/2019.  Has scheduled appointment with Neurology on 05/25/2019 and Vascular on 05/20/2019.  States she plans to keep these appointments and will ride the bus to [place] and stay with a cousin to attend the appointments.  Does state she plans to transfer care to provider in [place] during the summer.  Plan: RN Health Coach will send primary care provider quarterly update. RN Health Coach will make next telephone outreach to patient within the month of May and patient agrees to future outreach.  [place] [phone] [name]

## 2019-04-21 ENCOUNTER — Other Ambulatory Visit: Payer: Self-pay | Admitting: Internal Medicine

## 2019-04-21 DIAGNOSIS — M5431 Sciatica, right side: Secondary | ICD-10-CM

## 2019-04-21 DIAGNOSIS — F332 Major depressive disorder, recurrent severe without psychotic features: Secondary | ICD-10-CM

## 2019-04-21 NOTE — Telephone Encounter (Signed)
Need refill on   gabapentin (NEURONTIN) 400 MG capsule  traZODone (DESYREL) 50 MG tablet   ;pt contact [phone]   [id number] [place], [place]

## 2019-04-25 ENCOUNTER — Other Ambulatory Visit: Payer: Self-pay | Admitting: Internal Medicine

## 2019-04-25 DIAGNOSIS — M5431 Sciatica, right side: Secondary | ICD-10-CM

## 2019-04-25 DIAGNOSIS — F332 Major depressive disorder, recurrent severe without psychotic features: Secondary | ICD-10-CM

## 2019-04-25 DIAGNOSIS — M5432 Sciatica, left side: Secondary | ICD-10-CM

## 2019-04-25 MED ORDER — GABAPENTIN 400 MG PO CAPS: ORAL_CAPSULE | ORAL | 0 refills | Status: DC

## 2019-04-25 MED ORDER — TRAZODONE HCL 50 MG PO TABS: 50.0000 mg | ORAL_TABLET | Freq: Every day | ORAL | 0 refills | Status: DC

## 2019-04-25 NOTE — Telephone Encounter (Signed)
Pls contact pt [phone] regarding refill

## 2019-05-16 ENCOUNTER — Other Ambulatory Visit: Payer: Self-pay | Admitting: *Deleted

## 2019-05-16 NOTE — Patient Outreach (Signed)
[place] [facility]) Care Management  05/16/2019  [name] 06-Jun-1950 [id number]   [place] Monthly Outreach  Referral Date:06/09/2017 Referral Source:Transfer from [place] Reason for Referral:Continued Disease Management Education Insurance:[employer] Medicare   Outreach Attempt:  Outreach attempt #1 to patient for follow up. No answer. RN Health Coach left HIPAA compliant voicemail message along with contact information.  Plan:  RN Health Coach will make another outreach attempt within the month of May if no return call back from patient.  [place] [phone] [name]

## 2019-05-19 ENCOUNTER — Encounter: Payer: Self-pay | Admitting: *Deleted

## 2019-05-19 ENCOUNTER — Other Ambulatory Visit: Payer: Self-pay | Admitting: *Deleted

## 2019-05-19 NOTE — Patient Outreach (Signed)
[place] [facility]) Care Management  05/19/2019  [name] 02-Apr-1950 [id number]   [place] Monthly Outreach  Referral Date:06/09/2017 Referral Source:Transfer from [place] Reason for Referral:Continued Disease Management Education Insurance:United [employer] Medicare   Outreach Attempt:  Outreach attempt #2 to patient for follow up. No answer. RN Health Coach left HIPAA compliant voicemail message along with contact information.  Plan:  RN Health Coach will make another outreach attempt within the month of June if no return call back from patient.  [place] [phone] [email or web address]

## 2019-05-19 NOTE — Patient Outreach (Signed)
[place] [facility]) Care Management  05/19/2019  YAMILETT JANOWIAK 1950/09/03 [id number]   [place] Monthly Outreach  Referral Date:06/09/2017 Referral Source:Transfer from [place] Reason for Referral:Continued Disease Management Education Insurance:[employer] Medicare   Outreach Attempt:   Received call back from patient.  HIPAA verified with patient.  Patient reporting her mood is down.  States she is still satisfied with her move but [place] and she has limited transportation to places, not like access to public transportation when she was here.  States she has attempted to Psychiatrist and is awaiting call back.  Reports she did not check her blood sugars much in the month of March and April due to decrease in diabetic testing supplies.  While speaking with patient she remembered diabetic supplies was provided through [employer] prior to her changing insurance to [employer].  RN Health Coach sent message to Primary MD to send prescription for diabetic testing supplies for patient's Accucheck Aviva meter.  Fasting blood sugar this morning was 130 and recent fasting ranges were 110-130's.  Patient reporting she is out of Januvia and running low on Metformin and request RN Health Coach assist with contacting pharmacy for refills.  RN Health Coach outreached to [place] on [address] [place], [place] ([phone]) and spoke with Otila Kluver.  Per Otila Kluver patient has refills for Metformin but will needs refill prescription for Januvia.  Requested [facility] fax provider for Januvia refill request and to fill Metformin.  Updated patient on refill medications and to contact pharmacy to see when they would be ready to be picked up.  Continues to report vision is off.  Encouraged patient to schedule eye examination.  Appointments:  Has scheduled appointment with primary care provider, [name] on 05/26/2019 and Neurologist, Dr.  Jaynee Eagles on 05/25/2019.  Patient rescheduled Vascular appointments to 06/01/2019.  Has appointment to establish new primary care provider closer to her new home on 07/22/2019 with Dr. Marden Noble.  Patient still needs to schedule eye examination and encouraged patient to contact [place] to obtain eye provider in her area in network.  Plan: RN Health Coach contacted primary care provider, [name] for diabetic testing supplies prescriptions to be sent to patient's new pharmacy. RN Health Coach sent message to [place] Therapist requested call to patient. RN Health Coach contacted patients pharmacy for prescription refills for Metformin and Januvia. RN Health Coach will make another outreach to patient within the month of June prior to her transfer of primary care to out of network providers and patient agrees with future outreach.  [place] [phone] [email or web address]

## 2019-05-20 ENCOUNTER — Ambulatory Visit (HOSPITAL_COMMUNITY): Payer: Medicare Other

## 2019-05-20 ENCOUNTER — Other Ambulatory Visit: Payer: Self-pay | Admitting: Internal Medicine

## 2019-05-20 DIAGNOSIS — E1151 Type 2 diabetes mellitus with diabetic peripheral angiopathy without gangrene: Secondary | ICD-10-CM

## 2019-05-25 ENCOUNTER — Ambulatory Visit: Payer: Medicare Other | Admitting: Neurology

## 2019-05-26 ENCOUNTER — Encounter: Payer: Medicare Other | Admitting: Internal Medicine

## 2019-05-27 ENCOUNTER — Other Ambulatory Visit: Payer: Self-pay | Admitting: Internal Medicine

## 2019-05-27 DIAGNOSIS — I10 Essential (primary) hypertension: Secondary | ICD-10-CM

## 2019-05-27 DIAGNOSIS — F332 Major depressive disorder, recurrent severe without psychotic features: Secondary | ICD-10-CM

## 2019-05-27 DIAGNOSIS — M5432 Sciatica, left side: Secondary | ICD-10-CM

## 2019-05-27 MED ORDER — TRAZODONE HCL 50 MG PO TABS: 50.0000 mg | ORAL_TABLET | Freq: Every day | ORAL | 0 refills | Status: DC

## 2019-05-27 MED ORDER — GABAPENTIN 400 MG PO CAPS: ORAL_CAPSULE | ORAL | 0 refills | Status: DC

## 2019-05-27 MED ORDER — AMLODIPINE BESYLATE 10 MG PO TABS: 10.0000 mg | ORAL_TABLET | Freq: Every day | ORAL | 0 refills | Status: AC

## 2019-05-27 MED ORDER — LISINOPRIL-HYDROCHLOROTHIAZIDE 20-25 MG PO TABS: 1.0000 | ORAL_TABLET | Freq: Every day | ORAL | 0 refills | Status: AC

## 2019-05-27 NOTE — Telephone Encounter (Signed)
Need refill on traZODone (DESYREL) 50 MG tablet amLODipine (NORVASC) 10 MG tablet  gabapentin (NEURONTIN) 400 MG capsule lisinopril-hydrochlorothiazide (ZESTORETIC) 20-25 MG tablet ;pt contact [place], [place] - [address]

## 2019-05-30 ENCOUNTER — Other Ambulatory Visit: Payer: Self-pay | Admitting: *Deleted

## 2019-05-30 DIAGNOSIS — I739 Peripheral vascular disease, unspecified: Secondary | ICD-10-CM

## 2019-05-30 DIAGNOSIS — I779 Disorder of arteries and arterioles, unspecified: Secondary | ICD-10-CM

## 2019-05-30 DIAGNOSIS — M79605 Pain in left leg: Secondary | ICD-10-CM

## 2019-06-01 ENCOUNTER — Encounter (HOSPITAL_COMMUNITY): Payer: Medicare Other

## 2019-06-06 ENCOUNTER — Other Ambulatory Visit: Payer: Self-pay | Admitting: Internal Medicine

## 2019-06-06 DIAGNOSIS — I10 Essential (primary) hypertension: Secondary | ICD-10-CM

## 2019-06-15 ENCOUNTER — Other Ambulatory Visit: Payer: Self-pay | Admitting: Internal Medicine

## 2019-06-15 DIAGNOSIS — E1151 Type 2 diabetes mellitus with diabetic peripheral angiopathy without gangrene: Secondary | ICD-10-CM

## 2019-06-15 MED ORDER — LEVOTHYROXINE SODIUM 125 MCG PO TABS: 125.0000 ug | ORAL_TABLET | Freq: Every day | ORAL | 1 refills | Status: AC

## 2019-06-15 MED ORDER — SITAGLIPTIN PHOSPHATE 100 MG PO TABS: ORAL_TABLET | ORAL | 1 refills | Status: AC

## 2019-06-15 MED ORDER — CLOPIDOGREL BISULFATE 75 MG PO TABS: 75.0000 mg | ORAL_TABLET | Freq: Every day | ORAL | 1 refills | Status: AC

## 2019-06-15 NOTE — Telephone Encounter (Signed)
Needs refill on   levothyroxine (SYNTHROID) 125 MCG tablet   clopidogrel (PLAVIX) 75 MG tablet   sitaGLIPtin (JANUVIA) 100 MG tablet     ;pt contact [place], [place] [address] [place]

## 2019-06-21 ENCOUNTER — Encounter: Payer: Medicare Other | Admitting: Internal Medicine

## 2019-06-27 ENCOUNTER — Other Ambulatory Visit: Payer: Self-pay | Admitting: *Deleted

## 2019-06-27 NOTE — Patient Outreach (Signed)
[facility]) Care Management  06/27/2019  [name] Mar 20, 1950 [id number]   RN Health Coach Case Closure  Referral Date:06/09/2017 Referral Source:Transfer from [place] Reason for Referral:Continued Disease Management Education Insurance:[employer] Medicare   Outreach Attempt:  Successful telephone outreach to patient for Case Closure.  Patient continues to live in [place], [place] and has appointment to establish new primary care provider, Dr. Marden Noble on 07/22/2019.  Did not attend last appointment with [name] here in [place], but states she has appointment scheduled for this week.  Reminded patient of upcoming rescheduled Vascular appointment on 07/07/2019.  Discussed transportation in her current area of [place].  Provided patient with [place] transportation telephone number [phone]).  Discussed need for case closure due to patient transitioning to provider out of network of [facility] and patient stated her understanding.  Encouraged patient to contact [place] or [facility] in the future if she returns to this area or has questions or concerns.  Reports fasting blood sugar this morning was 103 and states she has all her prescriptions/medications at this time.  Plan:  RN Health Coach will close case and make inactive with [facility] due to patient having provider out of network.  RN Health Coach will send provider Case Closure letter.  RN Health Coach will send patient Case Closure Letter.  [place] [phone] [email or web address]

## 2019-06-28 ENCOUNTER — Other Ambulatory Visit: Payer: Self-pay | Admitting: *Deleted

## 2019-06-28 DIAGNOSIS — I739 Peripheral vascular disease, unspecified: Secondary | ICD-10-CM

## 2019-06-29 ENCOUNTER — Other Ambulatory Visit: Payer: Self-pay | Admitting: Internal Medicine

## 2019-06-29 ENCOUNTER — Ambulatory Visit: Payer: Medicare Other | Admitting: *Deleted

## 2019-06-29 DIAGNOSIS — M5431 Sciatica, right side: Secondary | ICD-10-CM

## 2019-06-29 DIAGNOSIS — F332 Major depressive disorder, recurrent severe without psychotic features: Secondary | ICD-10-CM

## 2019-06-29 MED ORDER — TRAZODONE HCL 50 MG PO TABS: 50.0000 mg | ORAL_TABLET | Freq: Every day | ORAL | 0 refills | Status: AC

## 2019-06-29 MED ORDER — GABAPENTIN 400 MG PO CAPS: ORAL_CAPSULE | ORAL | 0 refills | Status: AC

## 2019-06-29 NOTE — Telephone Encounter (Signed)
Need refill on gabapentin (NEURONTIN) 400 MG capsule  traZODone (DESYREL) 50 MG tablet ;pt contact [facility], [place] - [address]

## 2019-06-29 NOTE — Chronic Care Management (AMB) (Signed)
  Chronic Care Management   Note  06/29/2019 Name: [name] MRN: [id number] DOB: [birth year]  Reached patient via contact number to determine if she can secure transportation to her 11:15 am clinic appointment from her residence in [place], [place]. She states she does not have transportation and has not yet applied for [employer] transportation. She also says it is unlikely she could secure transportation to a nearby lab. Per [facility] website, the closest lab to patient's zip code is approximately 12 miles away in [place]. Provider provided with nearby [place] locations, fax and contact numbers.  Follow up plan: No further follow up required: as patient had moved out of area and is establishing with a new primary care provider.  Kelli Churn RN, CCM, [facility] RN Care Manager [phone]

## 2019-06-30 ENCOUNTER — Telehealth: Payer: Self-pay | Admitting: *Deleted

## 2019-06-30 ENCOUNTER — Ambulatory Visit (INDEPENDENT_AMBULATORY_CARE_PROVIDER_SITE_OTHER): Payer: Medicare Other | Admitting: Internal Medicine

## 2019-06-30 ENCOUNTER — Encounter: Payer: Self-pay | Admitting: Internal Medicine

## 2019-06-30 ENCOUNTER — Other Ambulatory Visit: Payer: Self-pay

## 2019-06-30 DIAGNOSIS — K5520 Angiodysplasia of colon without hemorrhage: Secondary | ICD-10-CM | POA: Diagnosis not present

## 2019-06-30 DIAGNOSIS — E039 Hypothyroidism, unspecified: Secondary | ICD-10-CM | POA: Diagnosis not present

## 2019-06-30 DIAGNOSIS — R5383 Other fatigue: Secondary | ICD-10-CM

## 2019-06-30 NOTE — Addendum Note (Signed)
Addended by: Hulan Fray on: 06/30/2019 04:59 PM   Modules accepted: Orders

## 2019-06-30 NOTE — Progress Notes (Signed)
  [facility] Internal Medicine Residency Telephone Encounter Continuity Care Appointment  HPI:   This telephone encounter was created for [name] on 06/30/2019 for the following purpose/cc follow up of diabetes and hypothyroidism. Patient is unfortunately unable to come to clinic this visit due to transportation.    Past Medical History:  Past Medical History:  Diagnosis Date  . Allergy   . Anal fissure   . Anemia   . Anxiety   . Blurry vision 09/19/2014  . Carpal tunnel syndrome    left  . Cataract   . Chronic headaches   . Complication of anesthesia    "fight when I come out from anesthesia"  . Depression   . Diverticulosis   . Estrogen deficiency 02/05/2018  . GERD (gastroesophageal reflux disease)   . Glaucoma   . HLD (hyperlipidemia)   . Hypertension   . Hypothyroidism   . Infestation by bed bug 07/29/2018   - hydrocodone acetate for itching - provided information on how to treat for bed bugs  - letter provided to help her obtain assistance for moving.   Marland Kitchen PAD (peripheral artery disease) ([place])   . PAD (peripheral artery disease) ([place])   . PUD (peptic ulcer disease)   . TIA (transient ischemic attack)   . Type 2 diabetes mellitus with complication ([place]) 11/94/1740      ROS:   Per hpi   Assessment / Plan / Recommendations:   Please see A&P under problem oriented charting for assessment of the patient's acute and chronic medical conditions.   As always, pt is advised that if symptoms worsen or new symptoms arise, they should go to an urgent care facility or to to ER for further evaluation.   Consent and Medical Decision Making:   Patient discussed with Dr. Philipp Ovens  This is a telephone encounter between [name] and [name] on 06/30/2019 for hypothyroidism follow up. The visit was conducted with the patient located at home and Emeterio Balke at [facility]. The patient's identity was confirmed using their DOB and current address. The patient has  consented to being evaluated through a telephone encounter and understands the associated risks (an examination cannot be done and the patient may need to come in for an appointment) / benefits (allows the patient to remain at home, decreasing exposure to coronavirus). I personally spent 25 minutes on medical discussion.

## 2019-07-01 ENCOUNTER — Telehealth: Payer: Self-pay | Admitting: Internal Medicine

## 2019-07-01 ENCOUNTER — Encounter: Payer: Self-pay | Admitting: *Deleted

## 2019-07-01 DIAGNOSIS — R5383 Other fatigue: Secondary | ICD-10-CM | POA: Insufficient documentation

## 2019-07-01 NOTE — Telephone Encounter (Signed)
Return pt's call - stated she has been talking to Dr Maricela Bo. Stated she needs refills on Tramadol and Gabapentin - informed Gabapentin has been refilled. Tramadol is not on current med list.  Needs refill on Tramadol. Also needs address to lab in [place], [place].

## 2019-07-01 NOTE — Telephone Encounter (Signed)
Pt requesting a callback [phone];

## 2019-07-01 NOTE — Telephone Encounter (Signed)
Attempted to callback patient, but was not able to reach.  Spoke to her this morning about her new symptoms of bloody stool, excessive fatigue (sleeping all day), vomiting. She cut call early stating she needed to go. When she calls back please make sure to tell her she needs to go to ED. I will attempt to call her again as well.

## 2019-07-01 NOTE — Assessment & Plan Note (Signed)
Patient is currently taking pantoprazole 40mg  bid. She needs GI follow up especially given current symptoms.

## 2019-07-01 NOTE — Telephone Encounter (Signed)
ERROR

## 2019-07-01 NOTE — Assessment & Plan Note (Addendum)
Patient with hypothyroidism s/p total thyroidectomy in 2011 that she has been on replacement therapy for. Patient's last tsh 0.207 in February 2021. At that time she had been inconsistent about taking her medication and she was counseled on ensuring that she takes her medication regularly and to take it early morning on an empty stomach 38min to 1 hr prior to a meal.  Patient states that she missed some doses of her levothyroxine last month. She is due to get tsh rechecked.   Assessment and plan  -placed tsh lab order to get collected at [place]. If she goes to ed or urgent care, it should be drawn at that time.

## 2019-07-01 NOTE — Assessment & Plan Note (Addendum)
Patient states that for the past 1 month she has been feeling very tired and not able to keep food down. She keeps falling asleep especially yesterday which caused her to not be able to make her telehealth appointment. She feels that once she wakes up she doesn't realize what happened.   Denies falls, but states that she has some dizziness. She has some accompanied lower abdominal pain that is intermittently present, burning in nature, and worse after she eats. She has been taking pantoprazole 40mg  bid. She has noticed blood in stool both in stool and when wipes. She has vomited few times last week. Denies dysuria  and headaches.   Patient cut off this physician during the encounter and said that she had to go. I advised her that it is important that she gets examined, but she said she had to go. I put in some labs (tsh, cbc, cmp) to get done at local [facility].   Tried calling patient back a few minutes later and kept reaching voicemail.   ADDENDUM The patient returned call a few hours later. I stated that her symptoms seem to be severe and she needs to go to an urgent care or emergency room in order to be examined in person. She said she will go tomorrow, I expressed concern that she should go immediately, but she said she will go tomorrow. Told her to call 911 if it gets worse. Reminded about after hours line should she need help.    Patient's symptoms are concerning for worsening avms, pud, inadequate thyroid control, anemia, etc.

## 2019-07-04 NOTE — Progress Notes (Signed)
Internal Medicine Clinic Attending  Case discussed with Dr. Chundi at the time of the visit.  We reviewed the resident's history and exam and pertinent patient test results.  I agree with the assessment, diagnosis, and plan of care documented in the resident's note. 

## 2019-07-07 ENCOUNTER — Inpatient Hospital Stay (HOSPITAL_COMMUNITY): Admission: RE | Admit: 2019-07-07 | Payer: Medicare Other | Source: Ambulatory Visit

## 2020-11-22 DIAGNOSIS — I739 Peripheral vascular disease, unspecified: Secondary | ICD-10-CM | POA: Diagnosis not present

## 2020-11-22 DIAGNOSIS — Z131 Encounter for screening for diabetes mellitus: Secondary | ICD-10-CM | POA: Diagnosis not present

## 2020-11-22 DIAGNOSIS — F5101 Primary insomnia: Secondary | ICD-10-CM | POA: Diagnosis not present

## 2020-11-22 DIAGNOSIS — Z136 Encounter for screening for cardiovascular disorders: Secondary | ICD-10-CM | POA: Diagnosis not present

## 2020-11-22 DIAGNOSIS — K219 Gastro-esophageal reflux disease without esophagitis: Secondary | ICD-10-CM | POA: Diagnosis not present

## 2020-11-22 DIAGNOSIS — F411 Generalized anxiety disorder: Secondary | ICD-10-CM | POA: Diagnosis not present

## 2020-11-22 DIAGNOSIS — E1143 Type 2 diabetes mellitus with diabetic autonomic (poly)neuropathy: Secondary | ICD-10-CM | POA: Diagnosis not present

## 2020-11-22 DIAGNOSIS — I10 Essential (primary) hypertension: Secondary | ICD-10-CM | POA: Diagnosis not present

## 2020-11-22 DIAGNOSIS — E038 Other specified hypothyroidism: Secondary | ICD-10-CM | POA: Diagnosis not present

## 2020-12-24 DIAGNOSIS — E782 Mixed hyperlipidemia: Secondary | ICD-10-CM | POA: Diagnosis not present

## 2020-12-24 DIAGNOSIS — I10 Essential (primary) hypertension: Secondary | ICD-10-CM | POA: Diagnosis not present

## 2020-12-24 DIAGNOSIS — F17200 Nicotine dependence, unspecified, uncomplicated: Secondary | ICD-10-CM | POA: Diagnosis not present

## 2020-12-24 DIAGNOSIS — E1143 Type 2 diabetes mellitus with diabetic autonomic (poly)neuropathy: Secondary | ICD-10-CM | POA: Diagnosis not present

## 2020-12-24 DIAGNOSIS — I739 Peripheral vascular disease, unspecified: Secondary | ICD-10-CM | POA: Diagnosis not present
